# Patient Record
Sex: Female | Born: 1935 | Race: White | Hispanic: No | Smoking: Never smoker
Health system: Southern US, Community
[De-identification: ages and names within clinical notes are randomized; demographics above are authoritative.]

## PROBLEM LIST (undated history)

## (undated) DIAGNOSIS — H409 Unspecified glaucoma: Secondary | ICD-10-CM

## (undated) DIAGNOSIS — M1990 Unspecified osteoarthritis, unspecified site: Secondary | ICD-10-CM

## (undated) DIAGNOSIS — K2270 Barrett's esophagus without dysplasia: Secondary | ICD-10-CM

## (undated) DIAGNOSIS — Z8719 Personal history of other diseases of the digestive system: Secondary | ICD-10-CM

## (undated) DIAGNOSIS — Z8489 Family history of other specified conditions: Secondary | ICD-10-CM

## (undated) DIAGNOSIS — T7840XA Allergy, unspecified, initial encounter: Secondary | ICD-10-CM

## (undated) DIAGNOSIS — K219 Gastro-esophageal reflux disease without esophagitis: Secondary | ICD-10-CM

## (undated) DIAGNOSIS — K2990 Gastroduodenitis, unspecified, without bleeding: Secondary | ICD-10-CM

## (undated) DIAGNOSIS — Z9889 Other specified postprocedural states: Secondary | ICD-10-CM

## (undated) DIAGNOSIS — K2289 Other specified disease of esophagus: Secondary | ICD-10-CM

## (undated) DIAGNOSIS — E785 Hyperlipidemia, unspecified: Secondary | ICD-10-CM

## (undated) DIAGNOSIS — M797 Fibromyalgia: Secondary | ICD-10-CM

## (undated) DIAGNOSIS — K228 Other specified diseases of esophagus: Secondary | ICD-10-CM

## (undated) DIAGNOSIS — D649 Anemia, unspecified: Secondary | ICD-10-CM

## (undated) DIAGNOSIS — D509 Iron deficiency anemia, unspecified: Secondary | ICD-10-CM

## (undated) DIAGNOSIS — O009 Unspecified ectopic pregnancy without intrauterine pregnancy: Secondary | ICD-10-CM

## (undated) DIAGNOSIS — R112 Nausea with vomiting, unspecified: Secondary | ICD-10-CM

## (undated) DIAGNOSIS — K449 Diaphragmatic hernia without obstruction or gangrene: Secondary | ICD-10-CM

## (undated) DIAGNOSIS — K5790 Diverticulosis of intestine, part unspecified, without perforation or abscess without bleeding: Secondary | ICD-10-CM

## (undated) DIAGNOSIS — E611 Iron deficiency: Secondary | ICD-10-CM

## (undated) DIAGNOSIS — R002 Palpitations: Secondary | ICD-10-CM

## (undated) DIAGNOSIS — R011 Cardiac murmur, unspecified: Secondary | ICD-10-CM

## (undated) DIAGNOSIS — I10 Essential (primary) hypertension: Secondary | ICD-10-CM

## (undated) DIAGNOSIS — Z5189 Encounter for other specified aftercare: Secondary | ICD-10-CM

## (undated) DIAGNOSIS — M810 Age-related osteoporosis without current pathological fracture: Secondary | ICD-10-CM

## (undated) DIAGNOSIS — K2970 Gastritis, unspecified, without bleeding: Secondary | ICD-10-CM

## (undated) DIAGNOSIS — I639 Cerebral infarction, unspecified: Secondary | ICD-10-CM

## (undated) DIAGNOSIS — H269 Unspecified cataract: Secondary | ICD-10-CM

## (undated) HISTORY — PX: COLONOSCOPY: SHX174

## (undated) HISTORY — DX: Cardiac murmur, unspecified: R01.1

## (undated) HISTORY — PX: CHOLECYSTECTOMY: SHX55

## (undated) HISTORY — DX: Unspecified osteoarthritis, unspecified site: M19.90

## (undated) HISTORY — PX: TUBAL LIGATION: SHX77

## (undated) HISTORY — PX: ECTOPIC PREGNANCY SURGERY: SHX613

## (undated) HISTORY — DX: Unspecified cataract: H26.9

## (undated) HISTORY — PX: UPPER GASTROINTESTINAL ENDOSCOPY: SHX188

## (undated) HISTORY — DX: Allergy, unspecified, initial encounter: T78.40XA

## (undated) HISTORY — DX: Iron deficiency anemia, unspecified: D50.9

## (undated) HISTORY — DX: Other specified disease of esophagus: K22.89

## (undated) HISTORY — DX: Encounter for other specified aftercare: Z51.89

## (undated) HISTORY — DX: Other specified diseases of esophagus: K22.8

## (undated) HISTORY — PX: CATARACT EXTRACTION, BILATERAL: SHX1313

## (undated) HISTORY — DX: Anemia, unspecified: D64.9

## (undated) HISTORY — DX: Cerebral infarction, unspecified: I63.9

## (undated) HISTORY — DX: Hyperlipidemia, unspecified: E78.5

## (undated) HISTORY — DX: Gastritis, unspecified, without bleeding: K29.70

## (undated) HISTORY — DX: Barrett's esophagus without dysplasia: K22.70

## (undated) HISTORY — DX: Personal history of other diseases of the digestive system: Z87.19

## (undated) HISTORY — PX: FACIAL COSMETIC SURGERY: SHX629

## (undated) HISTORY — DX: Diaphragmatic hernia without obstruction or gangrene: K44.9

## (undated) HISTORY — DX: Age-related osteoporosis without current pathological fracture: M81.0

## (undated) HISTORY — DX: Unspecified glaucoma: H40.9

## (undated) HISTORY — DX: Diverticulosis of intestine, part unspecified, without perforation or abscess without bleeding: K57.90

---
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
## 1998-04-29 ENCOUNTER — Other Ambulatory Visit: Admission: RE | Admit: 1998-04-29 | Discharge: 1998-04-29 | Payer: Self-pay | Admitting: Obstetrics & Gynecology

## 1998-11-26 ENCOUNTER — Inpatient Hospital Stay (HOSPITAL_COMMUNITY): Admission: EM | Admit: 1998-11-26 | Discharge: 1998-11-28 | Payer: Self-pay | Admitting: Gastroenterology

## 1999-05-05 ENCOUNTER — Other Ambulatory Visit: Admission: RE | Admit: 1999-05-05 | Discharge: 1999-05-05 | Payer: Self-pay | Admitting: Obstetrics & Gynecology

## 2000-08-08 ENCOUNTER — Other Ambulatory Visit: Admission: RE | Admit: 2000-08-08 | Discharge: 2000-08-08 | Payer: Self-pay | Admitting: Obstetrics & Gynecology

## 2000-11-02 ENCOUNTER — Ambulatory Visit (HOSPITAL_COMMUNITY): Admission: RE | Admit: 2000-11-02 | Discharge: 2000-11-02 | Payer: Self-pay | Admitting: Obstetrics & Gynecology

## 2000-11-02 ENCOUNTER — Encounter (INDEPENDENT_AMBULATORY_CARE_PROVIDER_SITE_OTHER): Payer: Self-pay

## 2001-08-29 ENCOUNTER — Other Ambulatory Visit: Admission: RE | Admit: 2001-08-29 | Discharge: 2001-08-29 | Payer: Self-pay | Admitting: Obstetrics & Gynecology

## 2003-07-19 ENCOUNTER — Other Ambulatory Visit: Admission: RE | Admit: 2003-07-19 | Discharge: 2003-07-19 | Payer: Self-pay | Admitting: Obstetrics & Gynecology

## 2004-04-07 ENCOUNTER — Encounter: Payer: Self-pay | Admitting: Internal Medicine

## 2004-04-07 DIAGNOSIS — K5730 Diverticulosis of large intestine without perforation or abscess without bleeding: Secondary | ICD-10-CM | POA: Insufficient documentation

## 2004-06-08 ENCOUNTER — Encounter (INDEPENDENT_AMBULATORY_CARE_PROVIDER_SITE_OTHER): Payer: Self-pay | Admitting: *Deleted

## 2004-06-08 ENCOUNTER — Encounter: Admission: RE | Admit: 2004-06-08 | Discharge: 2004-06-08 | Payer: Self-pay | Admitting: General Surgery

## 2004-12-25 ENCOUNTER — Encounter: Admission: RE | Admit: 2004-12-25 | Discharge: 2004-12-25 | Payer: Self-pay | Admitting: Internal Medicine

## 2005-04-22 ENCOUNTER — Encounter: Admission: RE | Admit: 2005-04-22 | Discharge: 2005-04-22 | Payer: Self-pay | Admitting: Internal Medicine

## 2005-09-18 ENCOUNTER — Encounter: Admission: RE | Admit: 2005-09-18 | Discharge: 2005-09-18 | Payer: Self-pay | Admitting: Orthopedic Surgery

## 2005-11-30 ENCOUNTER — Other Ambulatory Visit: Admission: RE | Admit: 2005-11-30 | Discharge: 2005-11-30 | Payer: Self-pay | Admitting: Obstetrics & Gynecology

## 2006-04-11 ENCOUNTER — Encounter: Admission: RE | Admit: 2006-04-11 | Discharge: 2006-05-18 | Payer: Self-pay | Admitting: Chiropractic Medicine

## 2006-05-19 ENCOUNTER — Encounter: Admission: RE | Admit: 2006-05-19 | Discharge: 2006-08-17 | Payer: Self-pay | Admitting: Chiropractic Medicine

## 2007-10-31 ENCOUNTER — Ambulatory Visit: Payer: Self-pay | Admitting: Internal Medicine

## 2007-10-31 LAB — CONVERTED CEMR LAB
Iron: 60 ug/dL (ref 42–145)
Saturation Ratios: 14.6 % — ABNORMAL LOW (ref 20.0–50.0)
Tissue Transglutaminase Ab, IgA: 0.3 units (ref <7)
Transferrin: 293.6 mg/dL (ref >=212.0)
Vitamin B-12: 326 pg/mL (ref 211–911)

## 2007-11-02 ENCOUNTER — Encounter: Payer: Self-pay | Admitting: Internal Medicine

## 2007-11-02 ENCOUNTER — Ambulatory Visit: Payer: Self-pay | Admitting: Internal Medicine

## 2007-11-02 DIAGNOSIS — K449 Diaphragmatic hernia without obstruction or gangrene: Secondary | ICD-10-CM | POA: Insufficient documentation

## 2007-11-02 DIAGNOSIS — K2100 Gastro-esophageal reflux disease with esophagitis, without bleeding: Secondary | ICD-10-CM | POA: Insufficient documentation

## 2007-11-02 DIAGNOSIS — K2970 Gastritis, unspecified, without bleeding: Secondary | ICD-10-CM

## 2007-11-02 DIAGNOSIS — K2270 Barrett's esophagus without dysplasia: Secondary | ICD-10-CM | POA: Insufficient documentation

## 2007-11-02 DIAGNOSIS — K2990 Gastroduodenitis, unspecified, without bleeding: Secondary | ICD-10-CM

## 2007-11-02 HISTORY — DX: Barrett's esophagus without dysplasia: K22.70

## 2007-11-02 HISTORY — DX: Gastritis, unspecified, without bleeding: K29.70

## 2007-11-02 HISTORY — DX: Gastroduodenitis, unspecified, without bleeding: K29.90

## 2007-12-05 ENCOUNTER — Ambulatory Visit: Payer: Self-pay | Admitting: Internal Medicine

## 2007-12-05 LAB — CONVERTED CEMR LAB
Basophils Absolute: 0 10*3/uL (ref 0.0–0.1)
Basophils Relative: 0.3 % (ref 0.0–1.0)
Eosinophils Absolute: 0.1 10*3/uL (ref 0.0–0.6)
Eosinophils Relative: 1.3 % (ref 0.0–5.0)
HCT: 34.9 % — ABNORMAL LOW (ref 36.0–46.0)
Hemoglobin: 12.1 g/dL (ref 12.0–15.0)
Iron: 56 ug/dL (ref 42–145)
Lymphocytes Relative: 19.8 % (ref 12.0–46.0)
MCHC: 34.5 g/dL (ref 30.0–36.0)
MCV: 90.8 fL (ref 78.0–100.0)
Monocytes Absolute: 0.6 10*3/uL (ref 0.2–0.7)
Monocytes Relative: 10.4 % (ref 3.0–11.0)
Neutro Abs: 3.7 10*3/uL (ref 1.4–7.7)
Neutrophils Relative %: 68.2 % (ref 43.0–77.0)
Platelets: 317 10*3/uL (ref 150–400)
RBC: 3.84 M/uL — ABNORMAL LOW (ref 3.87–5.11)
RDW: 12.1 % (ref 11.5–14.6)
Saturation Ratios: 13.9 % — ABNORMAL LOW (ref 20.0–50.0)
Transferrin: 288 mg/dL (ref >=212.0)
WBC: 5.5 10*3/uL (ref 4.5–10.5)

## 2008-01-26 ENCOUNTER — Ambulatory Visit: Payer: Self-pay | Admitting: Internal Medicine

## 2008-01-26 LAB — CONVERTED CEMR LAB
Basophils Absolute: 0.2 10*3/uL — ABNORMAL HIGH (ref 0.0–0.1)
Basophils Relative: 2.4 % — ABNORMAL HIGH (ref 0.0–1.0)
Eosinophils Absolute: 0.1 10*3/uL (ref 0.0–0.6)
Eosinophils Relative: 1.5 % (ref 0.0–5.0)
HCT: 37.6 % (ref 36.0–46.0)
Hemoglobin: 12.4 g/dL (ref 12.0–15.0)
Iron: 71 ug/dL (ref 42–145)
Lymphocytes Relative: 16.9 % (ref 12.0–46.0)
MCHC: 33 g/dL (ref 30.0–36.0)
MCV: 91.3 fL (ref 78.0–100.0)
Monocytes Absolute: 0.5 10*3/uL (ref 0.2–0.7)
Monocytes Relative: 6.5 % (ref 3.0–11.0)
Neutro Abs: 5.8 10*3/uL (ref 1.4–7.7)
Neutrophils Relative %: 72.7 % (ref 43.0–77.0)
Platelets: 323 10*3/uL (ref 150–400)
RBC: 4.12 M/uL (ref 3.87–5.11)
RDW: 12.6 % (ref 11.5–14.6)
Saturation Ratios: 18.8 % — ABNORMAL LOW (ref 20.0–50.0)
Transferrin: 270.2 mg/dL (ref >=212.0)
WBC: 8 10*3/uL (ref 4.5–10.5)

## 2008-02-08 DIAGNOSIS — K219 Gastro-esophageal reflux disease without esophagitis: Secondary | ICD-10-CM | POA: Insufficient documentation

## 2008-02-08 DIAGNOSIS — O009 Unspecified ectopic pregnancy without intrauterine pregnancy: Secondary | ICD-10-CM | POA: Insufficient documentation

## 2008-02-08 DIAGNOSIS — E7800 Pure hypercholesterolemia, unspecified: Secondary | ICD-10-CM | POA: Insufficient documentation

## 2008-02-08 DIAGNOSIS — M129 Arthropathy, unspecified: Secondary | ICD-10-CM | POA: Insufficient documentation

## 2008-02-08 HISTORY — DX: Unspecified ectopic pregnancy without intrauterine pregnancy: O00.90

## 2008-08-27 ENCOUNTER — Encounter: Admission: RE | Admit: 2008-08-27 | Discharge: 2008-08-27 | Payer: Self-pay | Admitting: Orthopedic Surgery

## 2008-10-01 ENCOUNTER — Encounter: Admission: RE | Admit: 2008-10-01 | Discharge: 2008-12-16 | Payer: Self-pay | Admitting: Neurosurgery

## 2009-10-19 ENCOUNTER — Ambulatory Visit: Payer: Self-pay | Admitting: Interventional Radiology

## 2009-10-19 ENCOUNTER — Emergency Department (HOSPITAL_BASED_OUTPATIENT_CLINIC_OR_DEPARTMENT_OTHER): Admission: EM | Admit: 2009-10-19 | Discharge: 2009-10-19 | Payer: Self-pay | Admitting: Emergency Medicine

## 2009-10-23 ENCOUNTER — Ambulatory Visit: Payer: Self-pay | Admitting: Internal Medicine

## 2009-11-04 ENCOUNTER — Ambulatory Visit: Payer: Self-pay | Admitting: Internal Medicine

## 2009-11-08 ENCOUNTER — Encounter: Payer: Self-pay | Admitting: Internal Medicine

## 2009-11-10 ENCOUNTER — Encounter: Payer: Self-pay | Admitting: Internal Medicine

## 2010-03-31 DIAGNOSIS — R0989 Other specified symptoms and signs involving the circulatory and respiratory systems: Secondary | ICD-10-CM | POA: Insufficient documentation

## 2010-03-31 DIAGNOSIS — R0602 Shortness of breath: Secondary | ICD-10-CM | POA: Insufficient documentation

## 2010-04-02 ENCOUNTER — Ambulatory Visit: Payer: Self-pay | Admitting: Cardiovascular Disease

## 2010-04-02 DIAGNOSIS — R002 Palpitations: Secondary | ICD-10-CM

## 2010-04-02 DIAGNOSIS — R5383 Other fatigue: Secondary | ICD-10-CM

## 2010-04-02 DIAGNOSIS — R5381 Other malaise: Secondary | ICD-10-CM | POA: Insufficient documentation

## 2010-04-02 HISTORY — DX: Palpitations: R00.2

## 2010-04-07 ENCOUNTER — Telehealth (INDEPENDENT_AMBULATORY_CARE_PROVIDER_SITE_OTHER): Payer: Self-pay | Admitting: *Deleted

## 2010-04-08 ENCOUNTER — Ambulatory Visit: Payer: Self-pay | Admitting: Cardiology

## 2010-04-08 ENCOUNTER — Ambulatory Visit: Payer: Self-pay | Admitting: Cardiovascular Disease

## 2010-04-08 ENCOUNTER — Encounter (HOSPITAL_COMMUNITY): Admission: RE | Admit: 2010-04-08 | Discharge: 2010-06-23 | Payer: Self-pay | Admitting: Cardiovascular Disease

## 2010-04-08 ENCOUNTER — Encounter (INDEPENDENT_AMBULATORY_CARE_PROVIDER_SITE_OTHER): Payer: Self-pay | Admitting: *Deleted

## 2010-04-08 ENCOUNTER — Ambulatory Visit: Payer: Self-pay

## 2010-04-21 ENCOUNTER — Encounter: Payer: Self-pay | Admitting: Internal Medicine

## 2010-04-21 ENCOUNTER — Ambulatory Visit: Payer: Self-pay | Admitting: Cardiovascular Disease

## 2010-04-28 ENCOUNTER — Encounter: Payer: Self-pay | Admitting: Internal Medicine

## 2010-05-22 ENCOUNTER — Encounter: Payer: Self-pay | Admitting: Internal Medicine

## 2010-05-25 ENCOUNTER — Encounter: Payer: Self-pay | Admitting: Internal Medicine

## 2010-06-04 ENCOUNTER — Ambulatory Visit: Payer: Self-pay | Admitting: Internal Medicine

## 2010-06-04 DIAGNOSIS — D509 Iron deficiency anemia, unspecified: Secondary | ICD-10-CM | POA: Insufficient documentation

## 2010-06-05 ENCOUNTER — Ambulatory Visit (HOSPITAL_COMMUNITY)
Admission: RE | Admit: 2010-06-05 | Discharge: 2010-06-05 | Payer: Self-pay | Source: Home / Self Care | Admitting: Internal Medicine

## 2010-06-26 ENCOUNTER — Encounter: Payer: Self-pay | Admitting: Internal Medicine

## 2010-07-16 ENCOUNTER — Encounter (HOSPITAL_COMMUNITY)
Admission: RE | Admit: 2010-07-16 | Discharge: 2010-09-15 | Payer: Self-pay | Source: Home / Self Care | Admitting: Internal Medicine

## 2010-07-30 ENCOUNTER — Telehealth: Payer: Self-pay | Admitting: Internal Medicine

## 2010-08-05 ENCOUNTER — Encounter: Payer: Self-pay | Admitting: Internal Medicine

## 2010-08-06 ENCOUNTER — Ambulatory Visit: Payer: Self-pay | Admitting: Internal Medicine

## 2010-08-10 ENCOUNTER — Telehealth: Payer: Self-pay | Admitting: Internal Medicine

## 2010-08-14 ENCOUNTER — Encounter: Payer: Self-pay | Admitting: Internal Medicine

## 2010-08-31 ENCOUNTER — Telehealth: Payer: Self-pay | Admitting: Internal Medicine

## 2010-09-02 ENCOUNTER — Ambulatory Visit: Payer: Self-pay | Admitting: Gastroenterology

## 2010-09-02 ENCOUNTER — Encounter: Payer: Self-pay | Admitting: Internal Medicine

## 2010-09-21 ENCOUNTER — Telehealth: Payer: Self-pay | Admitting: Internal Medicine

## 2010-12-23 ENCOUNTER — Encounter: Payer: Self-pay | Admitting: Internal Medicine

## 2011-01-19 NOTE — Procedures (Signed)
Summary: Colonoscopy  Patient: Terri Shah Note: All result statuses are Final unless otherwise noted.  Tests: (1) Colonoscopy (COL)   COL Colonoscopy           DONE     [facility]     [address]     [place], [place]  [address]           COLONOSCOPY PROCEDURE REPORT           PATIENT:  [name], [name]  MR#:  [id number]     BIRTHDATE:  [birth year], 76 yrs. old  GENDER:  female     ENDOSCOPIST:  Hedwig Morton. Juanda Chance, MD     REF. BY:  Creola Corn, M.D.     PROCEDURE DATE:  08/06/2010     PROCEDURE:  Colonoscopy 36644     ASA CLASS:  Class I     INDICATIONS:  Iron deficiency anemia H/H 8.4/27.9,, MCV 58, 8.3%     iron saturation hx of breast cancer     EGD mild gastritis, small bowl Bx normal     s/p remote diverticular bleed in [phone],     MEDICATIONS:   Versed 8 mg, Fentanyl 75 mcg           DESCRIPTION OF PROCEDURE:   After the risks benefits and     alternatives of the procedure were thoroughly explained, informed     consent was obtained.  Digital rectal exam was performed and     revealed no rectal masses.   The LB [id number] endoscope     was introduced through the anus and advanced to the cecum, which     was identified by both the appendix and ileocecal valve, without     limitations.  The quality of the prep was adequate, using MiraLax.     The instrument was then slowly withdrawn as the colon was fully     examined.     <<PROCEDUREIMAGES>>           FINDINGS:  Severe diverticulosis was found throughout the colon     (see image1, image2, and image5).  This was otherwise a normal     examination of the colon (see image6, image4, and image3).     Retroflexed views in the rectum revealed no abnormalities.    The     scope was then withdrawn from the patient and the procedure     completed.           COMPLICATIONS:  None     ENDOSCOPIC IMPRESSION:     1) Severe diverticulosis throughout the colon     2) Otherwise normal examination     nothing  to account for iron def. anemia     RECOMMENDATIONS:     H/H from Dr Ferd Hibbs office pending, she had an iron infusion 3     weeks ago     schedule small bowl capsule endoscopy     REPEAT EXAM:  In 10 year(s) for.           ______________________________     Hedwig Morton. Juanda Chance, MD           CC:           n.     eSIGNED:   Hedwig Morton. Erdem Naas at 08/06/2010 11:01 AM           Henriette Combs, [id number]  Note: An exclamation mark (!) indicates a result that was not dispersed into the flowsheet. Document Creation  Date: 08/06/2010 11:02 AM _______________________________________________________________________  (1) Order result status: Final Collection or observation date-time: 08/06/2010 10:50 Requested date-time:  Receipt date-time:  Reported date-time:  Referring Physician:   Ordering Physician: Lina Sar [phone]) Specimen Source:  Source: Launa Grill Order Number: [id number] Lab site:   Appended Document: Colonoscopy    Clinical Lists Changes  Observations: Added new observation of COLONNXTDUE: 07/2020 (08/06/2010 12:54)      Appended Document: Colonoscopy Capsule endoscopy scheduled for 08/17/10.  Patient instructed to start holding her iron 08/10/10.    Clinical Lists Changes  Orders: Added new Test order of Capsule Endoscopy (Capsule Endoscopy) - Signed

## 2011-01-19 NOTE — Assessment & Plan Note (Signed)
Summary: np6/palps/tachycardia/mild SOB   Visit Type:  Initial Consult Primary Provider:  Creola Corn, MD  CC:  Tachycardia- Sob and tiredness.  History of Present Illness: 77 yo WF with history of HTN, hyperlipidemia, OA, GERD here today for cardiac evaluation. She tells me that her exercise tolerance has been poor lately. This has been a big change for her. She is also easily out of breath when she walks. She has had no dizziness or chest pain. She also notes palpitations and awareness of heavy heart beats. No clear awareness of irregularity of her heart rhythm. Recent TSH normal in Dr. Ferd Hibbs office. BMET reviewed and ok. Overall, her lack of energy, fatigue and breathlessness with minimal exertion are most bothersome. She recently underwent an echocardiogram that showed normal LV size with posterior wall hypokinesis, mild concentric LVH, diastolic dysfunction, EF 60-65%. Aortic valve sclerosis without stenosis. Mild AI, MR,TR.   Current Medications (verified): 1)  Prilosec Otc 20 Mg Tbec (Omeprazole Magnesium) .... Take One By Mouth Once Daily, Twice A Day If Needed 2)  Metamucil 30.9 % Powd (Psyllium) .... Take One Tablespoon Mixed in Water At Night 3)  Benazepril Hcl 20 Mg Tabs (Benazepril Hcl) .... Take One By Mouth Once Daily 4)  Hydrochlorothiazide 25 Mg Tabs (Hydrochlorothiazide) .... Take One By Mouth Once Daily 5)  Lipitor 20 Mg Tabs (Atorvastatin Calcium) .... Take One By Mouth Once Daily 6)  Evista 60 Mg Tabs (Raloxifene Hcl) .... Take One By Mouth Once Daily 7)  Amitriptyline Hcl 25 Mg Tabs (Amitriptyline Hcl) .... Take One By Mouth Once Daily 8)  Tramadol Hcl 100 Mg Xr24h-Tab (Tramadol Hcl) .... Take One By Mouth Once - Two Times A Day As Needed 9)  Tylenol Arthritis Pain 650 Mg Cr-Tabs (Acetaminophen) .... Take One By Mouth As Needed 10)  Vitamin D 1000 Unit Tabs (Cholecalciferol) .... Take 1 Tablet By Mouth Once A Day  Allergies: 1)  ! Codeine  Past History:  Past  Medical History: HYPERCHOLESTEROLEMIA (ICD-272.0) HYPERTENSION (ICD-401.9) GERD (ICD-530.81) ECTOPIC PREGNANCY (ICD-633.90) TUBAL LIGATION, HX OF (ICD-V26.51) ARTHRITIS (ICD-716.90) DIVERTICULOSIS OF COLON (ICD-562.10) GASTRITIS (ICD-535.50) REFLUX ESOPHAGITIS (ICD-530.11) HIATAL HERNIA (ICD-553.3) BARRETT'S ESOPHAGUS (ICD-530.85) Degenerative disk disease Osteoarthritis Right bundle branch block Vitamin D Deficiency  Past Surgical History: Bilateral tubal ligation C- section X 3 Face lift  Family History: Family History of Heart Disease: Mother, sister Family History of Breast Cancer: Sister No FH of Colon Cancer Family History of Diabetes: sister boarderline Family History of Kidney Disease: sister  Mother-deceased, enlarged heart, CVA, age 75 Father-deceased, pneumonia 3 sisters-HTN in one 1 brother-deceased, unknown cause  Review of Systems       The patient complains of fatigue, malaise, palpitations, and shortness of breath.  The patient denies fever, weight gain/loss, vision loss, decreased hearing, hoarseness, chest pain, prolonged cough, wheezing, sleep apnea, coughing up blood, abdominal pain, blood in stool, nausea, vomiting, diarrhea, heartburn, incontinence, blood in urine, muscle weakness, joint pain, leg swelling, rash, skin lesions, headache, fainting, dizziness, depression, anxiety, enlarged lymph nodes, easy bruising or bleeding, and environmental allergies.    Vital Signs:  Patient profile:   77 year old female Height:      62 inches Weight:      135.50 pounds BMI:     24.87 Pulse rate:   73 / minute Pulse rhythm:   regular Resp:     18 per minute BP sitting:   110 / 80  (left arm) Cuff size:   regular  Vitals Entered By:  Vikki Ports (April 02, 2010 12:06 PM)  Physical Exam  General:  General: Well developed, well nourished, NAD HEENT: OP clear, mucus membranes moist SKIN: warm, dry Neuro: No focal deficits Musculoskeletal: Muscle  strength 5/5 all ext Psychiatric: Mood and affect normal Neck: No JVD, no carotid bruits, no thyromegaly, no lymphadenopathy. Lungs:Clear bilaterally, no wheezes, rhonci, crackles CV: RRR no murmurs, gallops rubs Abdomen: soft, NT, ND, BS present Extremities: No edema, pulses 2+.    EKG  Procedure date:  04/02/2010  Findings:      NSR, rate 73 bpm.  LAE. RBBB LVH Possible old lateral and  inferior infarction  Echocardiogram  Procedure date:  01/28/2010  Findings:      Normal LV size and function, EF 65%. Possible posterior wall hypokinesis.  Mild LVH. Diastolic dysfunction Aortic valve sclerosis without stenosis Mild AI, MR, TR   Impression & Recommendations:  Problem # 1:  FATIGUE / MALAISE (ICD-780.79) Her echocardiogram shows a possible posterior wall motion abnormality and her EKG shows possible prior inferior infarction. She has a family history of CAD. Her risk factors for CAD include HTN and hyperlipidemia. We will arrange an exercise stress myoview here in our office.  Recent TSH normal.  The stress myoview is ordered for the following reason: Pt is 77 yo with family h/o of CAD, personal history of HTN and hyperlipidemia with an abnormal EKG, abnormal echocardiogram and complaints of fatigue and exertional dyspnea that may be her anginal equivalent.   Problem # 2:  PALPITATIONS (ICD-785.1) I will have her wear a 48 hour monitor.   Her updated medication list for this problem includes:    Benazepril Hcl 20 Mg Tabs (Benazepril hcl) .Marland Kitchen... Take one by mouth once daily  Orders: Nuclear Stress Test (Nuc Stress Test) Holter Monitor (Holter Monitor)  Problem # 3:  HYPERTENSION (ICD-401.9) BP well controlled.   Her updated medication list for this problem includes:    Benazepril Hcl 20 Mg Tabs (Benazepril hcl) .Marland Kitchen... Take one by mouth once daily    Hydrochlorothiazide 25 Mg Tabs (Hydrochlorothiazide) .Marland Kitchen... Take one by mouth once daily  Patient Instructions: 1)   Your physician recommends that you schedule a follow-up appointment in: 3 weeks 2)  Your physician has recommended that you wear a holter monitor.  Holter monitors are medical devices that record the heart's electrical activity. Doctors most often use these monitors to diagnose arrhythmias. Arrhythmias are problems with the speed or rhythm of the heartbeat. The monitor is a small, portable device. You can wear one while you do your normal daily activities. This is usually used to diagnose what is causing palpitations/syncope (passing out). 3)  Your physician has requested that you have an exercise stress myoview.  For further information please visit [email or web address]  Please follow instruction sheet, as given.

## 2011-01-19 NOTE — Procedures (Signed)
Summary: summary report  summary report   Imported By: Mirna Mires 04/22/2010 10:07:27  _____________________________________________________________________  External Attachment:    Type:   Image     Comment:   External Document

## 2011-01-19 NOTE — Procedures (Signed)
Summary: Capsule Endoscopy Report / [facility]  Capsule Endoscopy Report /  GI   Imported By: Lennie Odor 10/12/2010 16:36:21  _____________________________________________________________________  External Attachment:    Type:   Image     Comment:   External Document

## 2011-01-19 NOTE — Progress Notes (Signed)
Summary: Med Questions  Phone Note Call from Patient Call back at Home Phone [phone]   Caller: Patient Call For: Dr Juanda Chance Reason for Call: Talk to Nurse Details for Reason: Capsule Endo Summary of Call: Pt has questions regarding what RX she can take prior appt. (specifically pain meds) Initial call taken by: Dwan Bolt,  August 31, 2010 10:31 AM  Follow-up for Phone Call        patient wants to nknow if it is ok for her to take Tylenol or Tramadol prior to capsule.  Advised this is ok. Follow-up by: Darcey Nora RN, CGRN,  August 31, 2010 10:39 AM

## 2011-01-19 NOTE — Letter (Signed)
Summary: [facility] Instructions  [facility]  [address] [place], [place] [address]   Phone: [phone]  Fax: [phone]       Terri Shah    [birth year]    MRN: [id number]       Procedure Day /Date: Monday 06/08/10     Arrival Time: 8:00 am     Procedure Time: 9:00 am     Location of Procedure:                    _x _  [facility] (4th Floor)  PREPARATION FOR COLONOSCOPY WITH MIRALAX  Starting 5 days prior to your procedure (06/03/10) do not eat nuts, seeds, popcorn, corn, beans, peas,  salads, or any raw vegetables.  Do not take any fiber supplements (e.g. Metamucil, Citrucel, and Benefiber). ____________________________________________________________________________________________________   THE DAY BEFORE YOUR PROCEDURE         DATE: 06/07/10 DAY: Sunday  1   Drink clear liquids the entire day-NO SOLID FOOD  2   Do not drink anything colored red or purple.  Avoid juices with pulp.  No orange juice.  3   Drink at least 64 oz. (8 glasses) of fluid/clear liquids during the day to prevent dehydration and help the prep work efficiently.  CLEAR LIQUIDS INCLUDE: Water Jello Ice Popsicles Tea (sugar ok, no milk/cream) Powdered fruit flavored drinks Coffee (sugar ok, no milk/cream) Gatorade Juice: apple, white grape, white cranberry  Lemonade Clear bullion, consomm, broth Carbonated beverages (any kind) Strained chicken noodle soup Hard Candy  4   Mix the entire bottle of Miralax with 64 oz. of Gatorade/Powerade in the morning and put in the refrigerator to chill.  5   At 3:00 pm take 2 Dulcolax/Bisacodyl tablets.  6   At 4:30 pm take one Reglan/Metoclopramide tablet.  7  Starting at 5:00 pm drink one 8 oz glass of the Miralax mixture every 15-20 minutes until you have finished drinking the entire 64 oz.  You should finish drinking prep around 7:30 or 8:00 pm.  8   If you are nauseated, you may take the 2nd Reglan/Metoclopramide tablet  at 6:30 pm.        9    At 8:00 pm take 2 more DULCOLAX/Bisacodyl tablets.        THE DAY OF YOUR PROCEDURE      DATE:  06/08/10 DAY: _Monday  You may drink clear liquids until 7:00am  (2 HOURS BEFORE PROCEDURE).   MEDICATION INSTRUCTIONS  Unless otherwise instructed, you should take regular prescription medications with a small sip of water as early as possible the morning of your procedure.  hold iron beginning today!         OTHER INSTRUCTIONS  You will need a responsible adult at least 75 years of age to accompany you and drive you home.   This person must remain in the waiting room during your procedure.  Wear loose fitting clothing that is easily removed.  Leave jewelry and other valuables at home.  However, you may wish to bring a book to read or an iPod/MP3 player to listen to music as you wait for your procedure to start.  Remove all body piercing jewelry and leave at home.  Total time from sign-in until discharge is approximately 2-3 hours.  You should go home directly after your procedure and rest.  You can resume normal activities the day after your procedure.  The day of your procedure you should not:  Drive   Make legal decisions   Operate machinery   Drink alcohol   Return to work  You will receive specific instructions about eating, activities and medications before you leave.   The above instructions have been reviewed and explained to me by   _______________________    I fully understand and can verbalize these instructions _____________________________ Date 06/04/10

## 2011-01-19 NOTE — Letter (Signed)
Summary: [facility]  [facility]   Imported By: Lamona Curl CMA (AAMA) 08/10/2010 11:51:48  _____________________________________________________________________  External Attachment:    Type:   Image     Comment:   External Document

## 2011-01-19 NOTE — Procedures (Signed)
Summary: capsule endoscopy/iron def anemia/sheri  Patient here today for capsule endoscopy for Dr. Juanda Chance .  Pt verbalized understanding of all verbal and written instructions.  Pt tolerated well.  Lot #  [id number]  25  exp 2012-10 .

## 2011-01-19 NOTE — Op Note (Signed)
Summary: Infusion / [facility]  Infusion / [facility]   Imported By: Lennie Odor 09/08/2010 12:10:04  _____________________________________________________________________  External Attachment:    Type:   Image     Comment:   External Document

## 2011-01-19 NOTE — Procedures (Signed)
Summary: Capsule Endoscopy   Capsule Endoscopy  Procedure date:  09/02/2010  Findings:      Performing Location: [facility]   Ordering Physician: Lina Sar, MD    Report created/read by: Stan Head, MD  Reason for Referral:  77 y/o female with an iron deficiency, anemia non responsive to oral iron.  Recent negative colon/EGD  Procedure Information and Findings:  1) Complete study, good prep  2) No cause of anemia found 3 ) small possible submucosal bulge at about 2 hours and 15 minutes  Summary and Recommendations:  Per Dr Juanda Chance.  Clinical correlation re: possible submucosal bulge is advised.  This is subtle and non specific finding at this point.    This report was created from the original report, which was reviewed and signed by the above listed reading physician.   Appended Document: Capsule Endoscopy please call pt with results, we did not find any  bleeding lesion. I recommend to follow her blood count every 3 months for the next year while taking iron supplements.  Appended Document: Capsule Endoscopy pt notified of results.  Dr Timothy Lasso is checking a CBC q 3 months, her next one will be in November.  She is asked to call back for any further problems and have Dr Timothy Lasso send [place] the lab results.

## 2011-01-19 NOTE — Assessment & Plan Note (Signed)
Summary: ANEMIA           Terri   History of Present Illness Visit Type: consult  Primary GI MD: Lina Sar MD Primary Provider: Creola Corn, MD Requesting Provider: Creola Corn, MD Chief Complaint: anemia x2 months, iron def History of Present Illness:   Terri Shah is a very nice 77 year old white female whom we have seen in the past for anemia and diverticulosis. She has a history of several  diverticular bleeds first one  in 1992, two were in 1999, and 1  in March 2005. She had 2 hospitalizations for it, and was evaluated by Dr. Abbey Chatters in 2005 for consideration of a laparoscopically assisted segmental colectomy, but she opted not to have it done. Her last colonoscopy was in April 2005, and it confirmed the presence of severe diverticulosis of the left colon. An endoscopy was in 2008 and it showed gastritis, esophagitis and a hiatal hernia. Biopsies found her to have Barrett's esophagus. A repeat endoscopy performed in 10/2009 showed a stricture in the distal esophagus and a hiatal hernia but was an otherwise normal examination. Biopsies showed benign mildly inflamed gastroesophageal junction mucosa but no intestinal metaplasia, dysplasia or malignancy. Her reflux symptoms are adequately controlled with Nexium but she cannot afford it and has to take over-the-counter Prilosec 20 mg twice a day. Patient comes today at the request of Dr Creola Corn for a repeat evaluation of her anemia. On 05/22/10, her hemoglobin was 10.0, hematocrit was 32.2%, iron was low at 36 and iron saturation was also low at 8.3%. Patient has already been tested for celiac disease  and is negative. She does not take any NSAID's and has been taking oral iron supplements 1-2 tablets daily without any increase in her iron stores.she gave blood loss fall but not since then.    GI Review of Systems      Denies abdominal pain, acid reflux, belching, bloating, chest pain, dysphagia with liquids, dysphagia with solids, heartburn,  loss of appetite, nausea, vomiting, vomiting blood, weight loss, and  weight gain.        Denies anal fissure, black tarry stools, change in bowel habit, constipation, diarrhea, diverticulosis, fecal incontinence, heme positive stool, hemorrhoids, irritable bowel syndrome, jaundice, light color stool, liver problems, rectal bleeding, and  rectal pain.    Current Medications (verified): 1)  Prilosec Otc 20 Mg Tbec (Omeprazole Magnesium) .... Take One By Mouth Once Daily, Twice A Day If Needed 2)  Metamucil 30.9 % Powd (Psyllium) .... Take One Tablespoon Mixed in Water At Night 3)  Benazepril Hcl 20 Mg Tabs (Benazepril Hcl) .... Take One By Mouth Once Daily 4)  Hydrochlorothiazide 25 Mg Tabs (Hydrochlorothiazide) .... Take One By Mouth Once Daily 5)  Lipitor 20 Mg Tabs (Atorvastatin Calcium) .... Take One By Mouth Once Daily 6)  Evista 60 Mg Tabs (Raloxifene Hcl) .... Take One By Mouth Once Daily 7)  Amitriptyline Hcl 25 Mg Tabs (Amitriptyline Hcl) .... Take One By Mouth Once Daily 8)  Tramadol Hcl 100 Mg Xr24h-Tab (Tramadol Hcl) .... Take One By Mouth Once - Two Times A Day As Needed 9)  Tylenol Arthritis Pain 650 Mg Cr-Tabs (Acetaminophen) .... Take One By Mouth As Needed 10)  Vitamin D 1000 Unit Tabs (Cholecalciferol) .... Take 1 Tablet By Mouth Once A Day  Allergies (verified): 1)  ! Codeine  Past History:  Past Medical History: Reviewed history from 04/21/2010 and no changes required. HYPERCHOLESTEROLEMIA (ICD-272.0) HYPERTENSION (ICD-401.9) Hypertensive heart disease (mild concentric LVH with  grade 1 diastolic dysfunction) GERD (ICD-530.81) ECTOPIC PREGNANCY (ICD-633.90) TUBAL LIGATION, HX OF (ICD-V26.51) ARTHRITIS (ICD-716.90) DIVERTICULOSIS OF COLON (ICD-562.10) GASTRITIS (ICD-535.50) REFLUX ESOPHAGITIS (ICD-530.11) HIATAL HERNIA (ICD-553.3) BARRETT'S ESOPHAGUS (ICD-530.85) Degenerative disk disease Osteoarthritis Right bundle branch block Vitamin D Deficiency  Past  Surgical History: Reviewed history from 04/02/2010 and no changes required. Bilateral tubal ligation C- section X 3 Face lift  Family History: Reviewed history from 04/02/2010 and no changes required. Family History of Heart Disease: Mother, sister Family History of Breast Cancer: Sister No FH of Colon Cancer Family History of Diabetes: sister boarderline Family History of Kidney Disease: sister  Mother-deceased, enlarged heart, CVA, age 52 Father-deceased, pneumonia 3 sisters-HTN in one 1 brother-deceased, unknown cause  Social History: Reviewed history from 10/23/2009 and no changes required. Alcohol Use - yes-occasional Illicit Drug Use - no Patient has never smoked.  Daily Caffeine Use Patient gets regular exercise. water areobics, walking  Review of Systems       The patient complains of allergy/sinus, anemia, arthritis/joint pain, back pain, cough, fatigue, muscle pains/cramps, shortness of breath, and thirst - excessive.  The patient denies anxiety-new, blood in urine, breast changes/lumps, change in vision, confusion, coughing up blood, depression-new, fainting, fever, headaches-new, hearing problems, heart murmur, heart rhythm changes, itching, menstrual pain, night sweats, nosebleeds, pregnancy symptoms, skin rash, sleeping problems, sore throat, swelling of feet/legs, swollen lymph glands, thirst - excessive , urination - excessive , urination changes/pain, urine leakage, vision changes, and voice change.         Pertinent positive and negative review of systems were noted in the above HPI. All other ROS was otherwise negative.   Vital Signs:  Patient profile:   77 year old female Height:      62 inches Weight:      136.25 pounds BMI:     25.01 Pulse rate:   72 / minute Pulse rhythm:   regular BP sitting:   140 / 80  (left arm) Cuff size:   regular  Vitals Entered By: June McMurray CMA Duncan Dull) (June 04, 2010 8:42 AM)  Physical Exam  General:  Well developed,  well nourished, no acute distress. Eyes:  nonicteric. Mouth:  normal papulated tongue. Neck:  Supple; no masses or thyromegaly. Lungs:  Clear throughout to auscultation. Heart:  Regular rate and rhythm; no murmurs, rubs,  or bruits. Abdomen:  soft abdomen with normal active bowel sounds. No bruit. No tenderness. Liver edge at costal margin. Rectal:  dark Hemoccult negative stool. Extremities:  No clubbing, cyanosis, edema or deformities noted. Skin:  no stigmata of liver disease Psych:  Alert and cooperative. Normal mood and affect.   Impression & Recommendations:  Problem # 1:  ANEMIA, IRON DEFICIENCY (ICD-280.9)  Patient has heme-negative iron deficiency anemia not responsive to iron supplements. She is again Hemoccult negative to date. A recent upper endoscopy did not show any lesion to account for anemia. Her last colonoscopy was 6 years ago. We will have to go ahead and repeat the colonoscopy and if it is negative, then we will proceed with a small bowel capsule endoscopy to look for AV malformations. She is scheduled for an iron infusion tomorrow. A small bowel capsule endoscopy would have to be done off iron.   Orders: Colonoscopy (Colon)  Problem # 2:  BARRETT'S ESOPHAGUS (ICD-530.85) Patient had goblet cells and intestinal metaplasia on esophageal biopsies in 2008 but this was not reproduced on her last endoscopy in November 2010. She still continues on acid suppressing agents; Prilosec 20  mg twice a day.  Patient Instructions: 1)  Schedule colonoscopy. 2)  If negative, will proceed with small bowel capsule endoscopy. 3)  Iron infusion tomorrow. 4)  Check iron and CBC in 3 weeks following infusion. 5)  If anemia is not responsive to iron, then I would consider a hematology consultation. 6)  Copy sent to : Dr Timothy Lasso 7)  The medication list was reviewed and reconciled.  All changed / newly prescribed medications were explained.  A complete medication list was provided to the  patient / caregiver. Prescriptions: DULCOLAX 5 MG  TBEC (BISACODYL) Day before procedure take 2 at 3pm and 2 at 8pm.  #4 x 0   Entered by:   Lamona Curl CMA (AAMA)   Authorized by:   Hart Carwin MD   Signed by:   Lamona Curl CMA (AAMA) on 06/04/2010   Method used:   Electronically to        [facility]* (retail)       [address] [place], [place]  [address]       Ph: [phone]       Fax: [phone]   RxID:   [id number] REGLAN 10 MG  TABS (METOCLOPRAMIDE HCL) As per prep instructions.  #2 x 0   Entered by:   Lamona Curl CMA (AAMA)   Authorized by:   Hart Carwin MD   Signed by:   Lamona Curl CMA (AAMA) on 06/04/2010   Method used:   Electronically to        [facility]* (retail)       [address] [place], [place]  [address]       Ph: [phone]       Fax: [phone]   RxID:   [id number] MIRALAX   POWD (POLYETHYLENE GLYCOL 3350) As per prep  instructions.  #255 grams x 0   Entered by:   Lamona Curl CMA (AAMA)   Authorized by:   Hart Carwin MD   Signed by:   Lamona Curl CMA (AAMA) on 06/04/2010   Method used:   Electronically to        [facility]* (retail)       [address] [place], [place]  [address]       Ph: [phone]       Fax: [phone]   RxID:   [id number]

## 2011-01-19 NOTE — Progress Notes (Signed)
Summary: Nuclear Pre-Procedure  Phone Note Outgoing Call Call back at [facility] Phone [phone]   Call placed by: Stanton Kidney, EMT-P,  April 07, 2010 2:03 PM Call placed to: Patient Action Taken: Phone Call Completed Summary of Call: Reviewed information on Myoview Information Sheet (see scanned document for further details).  Spoke with Patient.    Nuclear Med Background Indications for Stress Test: Evaluation for Ischemia   History: Echo  History Comments: 2/11 Echo: EF= 60-65%, mild LVH, Diastolic dysfunction  Symptoms: DOE, Fatigue, Fatigue with Exertion, Palpitations    Nuclear Pre-Procedure Cardiac Risk Factors: Family History - CAD, Hypertension, Lipids, RBBB Height (in): 62

## 2011-01-19 NOTE — Assessment & Plan Note (Signed)
Summary: 3wk f/u sl ok per dr. Clifton James   Visit Type:  3 wk f/u Primary Provider:  Creola Corn, MD  CC:  shortness of breath.  History of Present Illness: 77 yo WF with history of HTN, hyperlipidemia, OA, GERD here today for cardiac follow up. At the first visit, she told me that her exercise tolerance has been poor lately. This has been a big change for her. She is also easily out of breath when she walks. She has had no dizziness or chest pain. She also notes palpitations and awareness of heavy heart beats. No clear awareness of irregularity of her heart rhythm. Recent TSH normal in Dr. Ferd Hibbs office. BMET reviewed and ok. Overall, her lack of energy, fatigue and breathlessness with minimal exertion are most bothersome. She recently underwent an echocardiogram that showed normal LV size with posterior wall hypokinesis, mild concentric LVH, diastolic dysfunction, EF 60-65%. Aortic valve sclerosis without stenosis. Mild AI, MR,TR.   I ordered an exercise stress myoview and a Holter monitor. Her stress test showed no ischemia. LV function was vigorous. Her Holter showed rare PVCs.  She has noticed less palpitations over the last few weeks. Unfortunately, she did not notice any while wearing the heart monitor. This am, after drinking coffee, she felt  a few palpitations.   Problems Prior to Update: 1)  Hypertension, Heart Controlled w/o Assoc CHF  (ICD-402.10) 2)  Fatigue / Malaise  (ICD-780.79) 3)  Palpitations  (ICD-785.1) 4)  Shortness of Breath  (ICD-786.05) 5)  Tachycardia  (ICD-785) 6)  Hypercholesterolemia  (ICD-272.0) 7)  Hypertension  (ICD-401.9) 8)  Gerd  (ICD-530.81) 9)  Ectopic Pregnancy  (ICD-633.90) 10)  Tubal Ligation, Hx of  (ICD-V26.51) 11)  Arthritis  (ICD-716.90) 12)  Diverticulosis of Colon  (ICD-562.10) 13)  Gastritis  (ICD-535.50) 14)  Reflux Esophagitis  (ICD-530.11) 15)  Hiatal Hernia  (ICD-553.3) 16)  Barrett's Esophagus  (ICD-530.85)  Current Medications  (verified): 1)  Prilosec Otc 20 Mg Tbec (Omeprazole Magnesium) .... Take One By Mouth Once Daily, Twice A Day If Needed 2)  Metamucil 30.9 % Powd (Psyllium) .... Take One Tablespoon Mixed in Water At Night 3)  Benazepril Hcl 20 Mg Tabs (Benazepril Hcl) .... Take One By Mouth Once Daily 4)  Hydrochlorothiazide 25 Mg Tabs (Hydrochlorothiazide) .... Take One By Mouth Once Daily 5)  Lipitor 20 Mg Tabs (Atorvastatin Calcium) .... Take One By Mouth Once Daily 6)  Evista 60 Mg Tabs (Raloxifene Hcl) .... Take One By Mouth Once Daily 7)  Amitriptyline Hcl 25 Mg Tabs (Amitriptyline Hcl) .... Take One By Mouth Once Daily 8)  Tramadol Hcl 100 Mg Xr24h-Tab (Tramadol Hcl) .... Take One By Mouth Once - Two Times A Day As Needed 9)  Tylenol Arthritis Pain 650 Mg Cr-Tabs (Acetaminophen) .... Take One By Mouth As Needed 10)  Vitamin D 1000 Unit Tabs (Cholecalciferol) .... Take 1 Tablet By Mouth Once A Day  Allergies: 1)  ! Codeine  Past History:  Past Medical History: HYPERCHOLESTEROLEMIA (ICD-272.0) HYPERTENSION (ICD-401.9) Hypertensive heart disease (mild concentric LVH with grade 1 diastolic dysfunction) GERD (ICD-530.81) ECTOPIC PREGNANCY (ICD-633.90) TUBAL LIGATION, HX OF (ICD-V26.51) ARTHRITIS (ICD-716.90) DIVERTICULOSIS OF COLON (ICD-562.10) GASTRITIS (ICD-535.50) REFLUX ESOPHAGITIS (ICD-530.11) HIATAL HERNIA (ICD-553.3) BARRETT'S ESOPHAGUS (ICD-530.85) Degenerative disk disease Osteoarthritis Right bundle branch block Vitamin D Deficiency  Social History: Reviewed history from 10/23/2009 and no changes required. Alcohol Use - yes-occasional Illicit Drug Use - no Patient has never smoked.  Daily Caffeine Use Patient gets regular exercise. water areobics,  walking  Review of Systems       The patient complains of palpitations and shortness of breath.  The patient denies fatigue, malaise, fever, weight gain/loss, vision loss, decreased hearing, hoarseness, chest pain, prolonged cough,  wheezing, sleep apnea, coughing up blood, abdominal pain, blood in stool, nausea, vomiting, diarrhea, heartburn, incontinence, blood in urine, muscle weakness, joint pain, leg swelling, rash, skin lesions, headache, fainting, dizziness, depression, anxiety, enlarged lymph nodes, easy bruising or bleeding, and environmental allergies.    Vital Signs:  Patient profile:   77 year old female Height:      62 inches Weight:      136 pounds BMI:     24.96 Pulse rate:   80 / minute BP sitting:   131 / 75  (left arm) Cuff size:   regular  Vitals Entered By: Oswald Hillock (Apr 21, 2010 11:05 AM)  Physical Exam  General:  General: Well developed, well nourished, NAD Musculoskeletal: Muscle strength 5/5 all ext Psychiatric: Mood and affect normal Neck: No JVD, no carotid bruits, no thyromegaly, no lymphadenopathy. Lungs:Clear bilaterally, no wheezes, rhonci, crackles CV: RRR no murmurs, gallops rubs Abdomen: soft, NT, ND, BS present Extremities: No edema, pulses 2+.    Nuclear ETT  Procedure date:  04/08/2010  Findings:      The patient exercised for  5:15.  The patient stopped due to fatigue.  She c/o chest pain/pressure, 6/10, that was quickly relieved with rest.  There were no diagnostic ST-T wave changes; only occasional PVC's.  Myoview was injected at peak exercise and myocardial perfusion imaging was performed after a brief delay.  QPS  Raw Data Images:  Acuisition technically good; normal left ventricular size. Stress Images:  There is mild decreased uptake in the distal anterior wall. Rest Images:  There is mild decreased uptake in the distal anterior wall. Subtraction (SDS):  No evidence of ischemia. Transient Ischemic Dilatation:  .94  (Normal <1.22)  Lung/Heart Ratio:  .32  (Normal <0.45)  Quantitative Gated Spect Images  QGS EDV:  51 ml QGS ESV:  7 ml QGS EF:  86 % QGS cine images:  Normal wall motion.   Overall Impression   Exercise Capacity: Fair exercise  capacity. BP Response: Normal blood pressure response. Clinical Symptoms: There is chest pain ECG Impression: No significant ST segment change suggestive of ischemia. Overall Impression: There is mild soft tissue attenuation but no sign of scar or ischemia.   Holter Monitor  Procedure date:  04/08/2010  Findings:      Normal sinus rhythm. Rare PVCs (1 over 48 hours).   Impression & Recommendations:  Problem # 1:  HYPERTENSION, HEART CONTROLLED W/O ASSOC CHF (ICD-402.10) BP well controlled today. Repeat echo one year.   Her updated medication list for this problem includes:    Benazepril Hcl 20 Mg Tabs (Benazepril hcl) .Marland Kitchen... Take one by mouth once daily    Hydrochlorothiazide 25 Mg Tabs (Hydrochlorothiazide) .Marland Kitchen... Take one by mouth once daily  Problem # 2:  PALPITATIONS (ICD-785.1) Likely secondary to PVCs. Avoid caffeine and stimulants.   Her updated medication list for this problem includes:    Benazepril Hcl 20 Mg Tabs (Benazepril hcl) .Marland Kitchen... Take one by mouth once daily  Problem # 3:  SHORTNESS OF BREATH (ICD-786.05) No evidence of ischemia on stress testing. I do not think that this is cardiac related. Her EF is normal. I have discussed cardiac cath in the future to assess coronary arteries (for definitive diagnosis)  and PA pressures  if she continues to have SOB or has onset of any chest pain.   Her updated medication list for this problem includes:    Benazepril Hcl 20 Mg Tabs (Benazepril hcl) .Marland Kitchen... Take one by mouth once daily    Hydrochlorothiazide 25 Mg Tabs (Hydrochlorothiazide) .Marland Kitchen... Take one by mouth once daily  Patient Instructions: 1)  Your physician recommends that you schedule a follow-up appointment in: 12 months 2)  Your physician has requested that you have an echocardiogram.  Echocardiography is a painless test that uses sound waves to create images of your heart. It provides your doctor with information about the size and shape of your heart and how well your  heart's chambers and valves are working.  This procedure takes approximately one hour. There are no restrictions for this procedure. To be done in 12 months

## 2011-01-19 NOTE — Assessment & Plan Note (Signed)
Summary: Cardiology Nuclear Study  Nuclear Med Background Indications for Stress Test: Evaluation for Ischemia   History: Echo  History Comments: 2/11 Echo: EF= 60-65%, mild LVH  Symptoms: Chest Pressure, Chest Pressure with Exertion, DOE, Fatigue, Fatigue with Exertion, Palpitations  Symptoms Comments: c/o leg weakness, R=L. Last episode of HK:[id number].   Nuclear Pre-Procedure Cardiac Risk Factors: Family History - CAD, Hypertension, Lipids, RBBB Caffeine/Decaff Intake: None NPO After: 7:30 PM Lungs: Clear IV 0.9% NS with Angio Cath: 20g     IV Site: (L) AC IV Started by: Irean Hong RN Chest Size (in) 38     Cup Size C     Height (in): 62 Weight (lb): 134 BMI: 24.60  Nuclear Med Study 1 or 2 day study:  1 day     Stress Test Type:  Stress Reading MD:  Olga Millers, MD     Referring MD:  Melene Muller, MD Resting Radionuclide:  Technetium 40m Tetrofosmin     Resting Radionuclide Dose:  11 mCi  Stress Radionuclide:  Technetium 71m Tetrofosmin     Stress Radionuclide Dose:  33 mCi   Stress Protocol Exercise Time (min):  5:16 min     Max HR:  133 bpm     Predicted Max HR:  147 bpm  Max Systolic BP: 159 mm Hg     Percent Max HR:  90.48 %     METS: 7.0 Rate Pressure Product:  75643    Stress Test Technologist:  Rea College CMA-N     Nuclear Technologist:  Domenic Polite CNMT  Rest Procedure  Myocardial perfusion imaging was performed at rest 45 minutes following the intravenous administration of Myoview Technetium 75m Tetrofosmin.  Stress Procedure  The patient exercised for  5:15.  The patient stopped due to fatigue.  She c/o chest pain/pressure, 6/10, that was quickly relieved with rest.  There were no diagnostic ST-T wave changes; only occasional PVC's.  Myoview was injected at peak exercise and myocardial perfusion imaging was performed after a brief delay.  QPS Raw Data Images:  Acuisition technically good; normal left ventricular size. Stress Images:   There is mild decreased uptake in the distal anterior wall. Rest Images:  There is mild decreased uptake in the distal anterior wall. Subtraction (SDS):  No evidence of ischemia. Transient Ischemic Dilatation:  .94  (Normal <1.22)  Lung/Heart Ratio:  .32  (Normal <0.45)  Quantitative Gated Spect Images QGS EDV:  51 ml QGS ESV:  7 ml QGS EF:  86 % QGS cine images:  Normal wall motion.   Overall Impression  Exercise Capacity: Fair exercise capacity. BP Response: Normal blood pressure response. Clinical Symptoms: There is chest pain ECG Impression: No significant ST segment change suggestive of ischemia. Overall Impression: There is mild soft tissue attenuation but no sign of scar or ischemia.  Appended Document: Cardiology Nuclear Study No ischemia. Can we call her with results? cdm  Appended Document: Cardiology Nuclear Study LMTCB./[place]  Appended Document: Cardiology Nuclear Study PT AWARE./[place]

## 2011-01-19 NOTE — Letter (Signed)
Summary: [facility]   Imported By: Sherian Rein 05/08/2010 08:56:48  _____________________________________________________________________  External Attachment:    Type:   Image     Comment:   External Document

## 2011-01-19 NOTE — Letter (Signed)
Summary: [facility] - Outpatient Coinsurance Notice  [facility] - Outpatient Co[facility]   Imported By: Marylou Mccoy 04/09/2010 11:48:15  _____________________________________________________________________  External Attachment:    Type:   Image     Comment:   External Document

## 2011-01-19 NOTE — Progress Notes (Signed)
Summary: Triage  Phone Note Call from Patient Call back at Home Phone [phone]   Caller: Patient Call For: Dr. Juanda Chance Reason for Call: Talk to Nurse Summary of Call: pt. has COL sch'd for 08-06-10...normally takes metamucil...wants to know if she can take a stool softener Initial call taken by: Karna Christmas,  July 30, 2010 2:07 PM  Follow-up for Phone Call        Pt informed,  OK to take stool softner. Follow-up by: Ashok Cordia RN,  July 30, 2010 2:17 PM

## 2011-01-19 NOTE — Letter (Signed)
Summary: [facility]  [facility]   Imported By: Lester Noble 06/02/2010 13:39:34  _____________________________________________________________________  External Attachment:    Type:   Image     Comment:   External Document

## 2011-01-19 NOTE — Progress Notes (Signed)
Summary: labwork received?  Phone Note Call from Patient Call back at Home Phone [phone]   Caller: Patient Call For: Dr. Juanda Chance Reason for Call: Talk to Nurse Summary of Call: would like to know if Dr. Juanda Chance has received the lastest labwork report with iron levels from Dr. Timothy Lasso Initial call taken by: Vallarie Mare,  August 10, 2010 10:22 AM  Follow-up for Phone Call        No. I have called to request that they send over those results.  Follow-up by: Lamona Curl CMA Duncan Dull),  August 10, 2010 10:55 AM  Additional Follow-up for Phone Call Additional follow up Details #1::        Results recieved from Dr Ferd Hibbs office.  Dr Juanda Chance- Patient wonders since her hemoglobin has gone up if you would like her to keep her capsule endoscopy appointment on 08/17/10 or if you would like to cancel it. (I have entered the 08/05/10 labs into emr for your review) Additional Follow-up by: Dottie Nelson-Smith CMA Duncan Dull),  August 10, 2010 11:39 AM    Additional Follow-up for Phone Call Additional follow up Details #2::    Please keep the SBCE scheduled. Also , please, set up for another Iron infusion, it is helping but she needs more iron. Follow-up by: Hart Carwin MD,  August 10, 2010 7:34 PM  Additional Follow-up for Phone Call Additional follow up Details #3:: Details for Additional Follow-up Action Taken: I have spoken to the patient to explain that Dr Juanda Chance would like for her to keep her SBCE scheduled as we need to make certain there is no bleeding source that would be reason for her anemia. I have also advised her that while her iron levels have improved, it is likely a result of her oral iron intake as well as the infusions. Dr Juanda Chance would like for her to be set up for a repeat iron infusion. Patient has been scheduled and advised of date and time of appointment. She has also asked to reschedule her SBCE to mid September.  Additional Follow-up by: Lamona Curl CMA Duncan Dull),   August 11, 2010 8:25 AM

## 2011-01-19 NOTE — Letter (Signed)
Summary: [facility]   Imported By: Lamona Curl CMA (AAMA) 08/10/2010 11:40:07  _____________________________________________________________________  External Attachment:    Type:   Image     Comment:   External Document

## 2011-01-19 NOTE — Progress Notes (Signed)
Summary: Triage  Phone Note Call from Patient Call back at Home Phone [phone]   Caller: Patient Call For: Dr. Juanda Chance Reason for Call: Lab or Test Results Summary of Call: Calling about the capsule endo that she had Initial call taken by: Karna Christmas,  September 21, 2010 4:28 PM  Follow-up for Phone Call        patient advised we will call her with the results as soon as they are available. Follow-up by: Darcey Nora RN, CGRN,  September 21, 2010 4:53 PM

## 2011-03-25 LAB — COMPREHENSIVE METABOLIC PANEL
ALT: 16 U/L (ref 0–35)
AST: 21 U/L (ref 0–37)
Albumin: 3.8 g/dL (ref 3.5–5.2)
Alkaline Phosphatase: 103 U/L (ref 39–117)
BUN: 9 mg/dL (ref 6–23)
CO2: 27 mEq/L (ref 19–32)
Calcium: 8.9 mg/dL (ref 8.4–10.5)
Chloride: 99 mEq/L (ref 96–112)
Creatinine, Ser: 0.7 mg/dL (ref 0.4–1.2)
GFR calc Af Amer: >60 mL/min (ref >60)
GFR calc non Af Amer: >60 mL/min (ref >60)
Glucose, Bld: 110 mg/dL — ABNORMAL HIGH (ref 70–99)
Potassium: 3.8 mEq/L (ref 3.5–5.1)
Sodium: 136 mEq/L (ref 135–145)
Total Bilirubin: 0.3 mg/dL (ref 0.3–1.2)
Total Protein: 6.7 g/dL (ref 6.0–8.3)

## 2011-03-25 LAB — URINALYSIS, ROUTINE W REFLEX MICROSCOPIC
Bilirubin Urine: NEGATIVE
Glucose, UA: NEGATIVE mg/dL
Hgb urine dipstick: NEGATIVE
Ketones, ur: NEGATIVE mg/dL
Nitrite: NEGATIVE
Protein, ur: NEGATIVE mg/dL
Specific Gravity, Urine: 1.007 (ref 1.005–1.030)
Urobilinogen, UA: 0.2 mg/dL (ref 0.0–1.0)
pH: 6 (ref 5.0–8.0)

## 2011-03-25 LAB — DIFFERENTIAL
Basophils Absolute: 0.1 10*3/uL (ref 0.0–0.1)
Basophils Relative: 1 % (ref 0–1)
Eosinophils Absolute: 0 10*3/uL (ref 0.0–0.7)
Eosinophils Relative: 0 % (ref 0–5)
Lymphocytes Relative: 10 % — ABNORMAL LOW (ref 12–46)
Lymphs Abs: 1.3 10*3/uL (ref 0.7–4.0)
Monocytes Absolute: 0.8 10*3/uL (ref 0.1–1.0)
Monocytes Relative: 6 % (ref 3–12)
Neutro Abs: 10.4 10*3/uL — ABNORMAL HIGH (ref 1.7–7.7)
Neutrophils Relative %: 83 % — ABNORMAL HIGH (ref 43–77)

## 2011-03-25 LAB — CBC
HCT: 33.7 % — ABNORMAL LOW (ref 36.0–46.0)
Hemoglobin: 11.4 g/dL — ABNORMAL LOW (ref 12.0–15.0)
MCHC: 33.9 g/dL (ref 30.0–36.0)
MCV: 89.9 fL (ref 78.0–100.0)
Platelets: 301 10*3/uL (ref 150–400)
RBC: 3.75 MIL/uL — ABNORMAL LOW (ref 3.87–5.11)
RDW: 13.2 % (ref 11.5–15.5)
WBC: 12.6 10*3/uL — ABNORMAL HIGH (ref 4.0–10.5)

## 2011-03-25 LAB — URINE CULTURE

## 2011-03-25 LAB — POCT CARDIAC MARKERS
CKMB, poc: 1 ng/mL (ref 1.0–8.0)
Myoglobin, poc: 80 ng/mL (ref 12–200)
Troponin i, poc: <0.05 ng/mL (ref 0.00–0.09)

## 2011-05-04 ENCOUNTER — Encounter: Payer: Self-pay | Admitting: Cardiovascular Disease

## 2011-05-04 NOTE — Assessment & Plan Note (Signed)
[facility]                         GASTROENTEROLOGY OFFICE NOTE   NAME:JOHNSONSkylen, Shah                      MRN:          [id number]  DATE:10/31/2007                            DOB:          02/21/1934    [name] is a very nice 77 year old white female who is here at Dr.  Ferd Hibbs recommendation for dropping hemoglobin.  Dr. Ferd Hibbs records  indicate a hemoglobin was recently 12.9, and subsequently dropped to  11.6, and most recently on September 18, 2007 was down to 11.3.  This  was while she was taking iron supplements daily.  Patient has a history  of severe diverticular bleeds in 1992, two in 1999, and 1 in March 2005.  She had 2 hospitalizations for it, and was evaluated by Dr. Abbey Chatters  in 2005 for consideration of laparoscopically assisted segmental  colectomy, but she opted not to have it done, and has done quite well  for the past 3 years.  She denies any visible blood per rectum or  abdominal pain.  She most recently had an episode while doing water  aerobics of nausea and vomiting.  She takes Prilosec 20 mg a day for  gastroesophageal reflux.  She needs it on a daily basis, and never  forgets to take it because of symptoms, which start immediately within  24 hours of not taking the Prilosec.  She denies any dysphagia.  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  Her last  colonoscopy was in April 2005, and confirmed presence of severe  diverticulosis of the left colon.   MEDICATIONS:  1. Centrum vitamins.  2. Iron supplements.  3. Prilosec 20 p.o. daily.  4. Benzathine/hydrochlorothiazide 20/12.5 mg p.o. daily.  5. Amitriptyline 50 mg p.o. nightly.  6. Fish oil.  7. Lipitor.  8. Celebrex 200 mg daily.  9. Evista 60 mg p.o. daily.  10.Vitamin D.  11.Metamucil.   PAST HISTORY:  Significant for:  1. High cholesterol.  2. Arthritis.  3. High blood pressure.   OPERATIONS:  1. Tubal ligation.  2. Ectopic pregnancy.   FAMILY HISTORY:  Positive for diabetes in mother.  Breast cancer in  older sister.  Heart disease in mother.   SOCIAL HISTORY:  She is widowed with 3 children.  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  She does not drink alcohol  or smoke.   REVIEW OF SYSTEMS:  Positive for eyeglasses, arthritic complaints, skin  rash, anemia, back pain.   PHYSICAL EXAMINATION:  Blood pressure 136/82.  Pulse 86.  Weight 143  pounds.  She was alert, oriented, and in no distress.  SKIN:  Was warm and dry.  LUNGS:  Clear to auscultation.  COR:  Normal S1 and S2.  ABDOMEN:  Soft with minimal tenderness in epigastric area.  There were  normoactive bowel sounds.  Lower abdomen was unremarkable.  RECTAL EXAM:  Dark trace Hemoccult positive stool.  There were no  external hemorrhoids.   IMPRESSION:  A 77 year old white female  with a drop in hemoglobin of 1.5  gm in the setting of prior history of major diverticular bleeds, but now  without any evidence of visible blood.  She has had some upper  gastrointestinal symptoms recently while taking Celebrex 200 mg a day.  There is a possibility that her gastrointestinal blood loss is  precipitated by erosive gastritis or Celebrex associated gastropathy.  Since she had a colonoscopy within the last 3 years, the most likely  source of the bleeding will be upper gastrointestinal.   PLAN:  1. Minimize Celebrex.  Patient says that she cannot go without it.  2. Samples of Nexium 40 mg daily in placed of Prilosec.  3. Upper endoscopy scheduled.  4. We will check her tissue transglutaminase level to rule out      malabsorption.  We will also check her hemoglobin, iron studies,      and B-12 as well to rule out multifactorial anemia.  We will have      to discuss her Celebrex, depending on the results of the upper      endoscopy, especially if she has gastritis  with gastropathy related      to Celebrex, she may have to hold the Celebrex temporarily until      her lesion heals.     Hedwig Morton. Juanda Chance, MD  Electronically Signed    DMB/MedQ  DD: 10/31/2007  DT: 11/01/2007  Job #: [id number]   cc:   Gwen Pounds, MD

## 2011-05-07 NOTE — Op Note (Signed)
[facility]  Patient:    Terri Shah, Terri Shah                      MRN: [id number] Proc. Date: 11/02/00 Adm. Date:  [id number] Attending:  Minette Headland                           Operative Report  PREOPERATIVE DIAGNOSIS:       Postmenopausal bleeding, suspected endometrial                               polyp, ultrasound.  POSTOPERATIVE DIAGNOSES:      1. Small endometrial polyp.                               2. Small band-like adhesion of endometrial                                  cavity.  PROCEDURE:                    1. Hysteroscopy, with dilatation and curettage.                               2. Removal of adhesion and of polyp.  SURGEON:                      Freddy Finner, M.D.  ANESTHESIA:                   Intravenous sedation, paracervical block                               using 10 cc of 1% Xylocaine.  ESTIMATED INTRAOPERATIVE BLOOD LOSS:  Less than 5 cc.  SORBITOL DEFICIT:             20 cc.  INTRAOPERATIVE COMPLICATIONS: None.  INDICATIONS:                  The patient is a 77 year old who is on hormone replacement therapy, and had an episode of postmenopausal bleeding.  A pelvic ultrasound showed an echogenic focus within the uterus, consistent with a polyp.  She is admitted now for a hysteroscopy and a D&C.  INTRAOPERATIVE FINDINGS:      A small polyp on the left lateral endometrial surface, and a band-like adhesion in that location also.  No other abnormalities were noted within the cavity.  The uterus sounded 8.0 cm.  DESCRIPTION OF PROCEDURE:     The patient was admitted to day surgery and was brought to the operating room and placed under adequate intravenous sedation, and placed in the dorsal lithotomy position, using the Allen stirrup system. A Betadine prep of the perineum and the vagina was carried out.  Sterile drapes were applied.  A medium Pederson speculum was required because of narrowing of the vaginal apex.   The cervix was visualized and grasped with a single tooth tenaculum.  The paracervical block was placed using 10 cc of 1% Xylocaine.  Injections were made at 4 and 8 oclock in the vaginal fornices. The cervix was progressively  dilated to 23 with Pratts.  The [facility] 12.5-degree hysteroscope was introduced using 3% Sorbitol as a distending medium. Inspection of the cavity revealed a small polyp and adhesion as noted above. A thorough curettage and with polyp forceps was carried out.  Reinspection revealed adequate resection of the polyp and the adhesion.  The material was submitted for histologic examination.  The procedure was terminated.  All instruments were removed.  The patient was taken to the recovery room in good condition and will be discharged in the immediate postoperative period, for followup in the office in approximately one week.  She is to take ibuprofen as needed for postoperative pain.  She is given a prescription for Darvocet, to be taken for more severe cramping pain. DD:  11/02/00 TD:  11/02/00 Job: [id number]

## 2011-08-13 ENCOUNTER — Encounter: Payer: Self-pay | Admitting: *Deleted

## 2011-08-13 ENCOUNTER — Emergency Department (HOSPITAL_BASED_OUTPATIENT_CLINIC_OR_DEPARTMENT_OTHER)
Admission: EM | Admit: 2011-08-13 | Discharge: 2011-08-13 | Disposition: A | Discharge status: Home / Self Care | Payer: Medicare Other | Attending: Emergency Medicine | Admitting: Emergency Medicine

## 2011-08-13 DIAGNOSIS — I10 Essential (primary) hypertension: Secondary | ICD-10-CM | POA: Insufficient documentation

## 2011-08-13 DIAGNOSIS — R040 Epistaxis: Secondary | ICD-10-CM

## 2011-08-13 HISTORY — DX: Essential (primary) hypertension: I10

## 2011-08-13 NOTE — ED Notes (Signed)
Pt with intermittent left nare bleeding x 10 days pt has seen Dr Jearld Fenton on Tuesday pt denies injury. Bleeding has stopped at present

## 2011-08-13 NOTE — Discharge Instructions (Signed)
Epistaxis (Nosebleed)   Nosebleeds can be caused by many conditions including trauma, infections, polyps, foreign bodies, dry mucous membranes or climate, medications and air conditioning. Most nosebleeds occur in the front of the nose. It is because of this location that most nosebleeds can be controlled by pinching the nostrils gently and continuously. Do this for at least 10 to 20 minutes. The reason for this long continuous pressure is that you must hold it long enough for the blood to clot. If during that 10 to 20 minute time period, pressure is released, the process may have to be started again. The nosebleed may stop by itself, quit with pressure, need concentrated heating (cautery) or stop with pressure from packing.   HOME CARE INSTRUCTIONS   If your nose was packed, try to maintain the pack inside until your caregiver removes it. If a gauze pack was used and it starts to fall out, gently replace or cut the end off. Do NOT cut if a balloon catheter was used to pack the nose. Otherwise, do not remove unless instructed.   Avoid blowing your nose for 12 hours after treatment. This could dislodge the pack or clot and start bleeding again.   If the bleeding starts again, sit up and bending forward, gently pinch the front half of your nose continuously for 20 minutes.   If bleeding was caused by dry mucous membranes, cover the inside of your nose every morning with a petroleum or antibiotic ointment. Use your little fingertip as an applicator. Do this as needed during dry weather. This will keep the mucous membranes moist and allow them to heal.   Maintain humidity in your home by using less air conditioning or using a humidifier.   Do not use aspirin or medications which make bleeding more likely. Your caregiver can give you recommendations on this.   Resume normal activities as able but try to avoid straining, lifting or bending at the waist for several days.   If the nosebleeds become recurrent and the cause  is unknown, your caregiver may suggest laboratory tests.   SEEK IMMEDIATE MEDICAL CARE IF:   Bleeding recurs and cannot be controlled.   There is unusual bleeding from or bruising on other parts of the body.   An unexplained oral temperature above 101 develops.   Nosebleeds continue.   There is any worsening of the condition which originally brought you in.   You become light headed, feel faint, become sweaty or vomit blood.   MAKE SURE YOU:   Understand these instructions.   Will watch your condition.   Will get help right away if you are not doing well or get worse.   Document Released: 09/15/2005 Document Re-Released: 11/24/2009   [facility] Patient Information 2011 [place], [place].

## 2011-08-13 NOTE — ED Provider Notes (Signed)
History     CSN: [id number] Arrival date & time: 08/13/2011  7:06 AM  Chief Complaint  Patient presents with  . Epistaxis   HPI Comments: History is obtained from the patient. The patient reports intermittent epistaxis from her left nares over the last week and a half. She did see Dr. flyers on Tuesday but at that time she had no bleeding and he was not able to determine a specific source. He recommended the patient use some Afrin on cotton ball applied to the inside portion of her left nares order stoplight bleeding. She has done this several times with success. However last night it 03 100 she was awake and had spontaneous bleeding from her left side again that was described as pouring out. It was not "pumping". She did try to apply pressure to her left nares however this caused blood to accumulate down her throat. She does not have any throat pain, stomach pain, nausea or vomiting. The patient decided to come here for evaluation and prior to arrival the bleeding did stop spontaneously. She denies any lightheadedness or dizziness. She denies use of aspirin, ibuprofen, Coumadin, Plavix. She does request that if she needs to see ears nose throat physician again, she would prefer another physician. She also reports that she is using saline and attempting to keep her nose moist and clean. She reports that she's never had significant problems with nosebleeds in the past. She does admit she has a history of sinus problems including postnasal drip which can lead to sore throats in coughing.  Patient is a 77 y.o. female presenting with nosebleeds.  Epistaxis     Past Medical History  Diagnosis Date  . Hypertension     Past Surgical History  Procedure Date  . Cesarean section     History reviewed. No pertinent family history.  History  Substance Use Topics  . Smoking status: Never Smoker   . Smokeless tobacco: Not on file  . Alcohol Use: No    OB History    Grav Para Term Preterm  Abortions TAB SAB Ect Mult Living                  Review of Systems  HENT: Positive for nosebleeds.   All other systems reviewed and are negative.    Physical Exam  BP 166/91  Pulse 88  Temp(Src) 97.9 F (36.6 C) (Oral)  Resp 18  SpO2 100%  Physical Exam  Constitutional: She is oriented to person, place, and time. She appears well-developed and well-nourished. No distress.  HENT:  Head: Normocephalic and atraumatic.  Nose: No mucosal edema. Right sinus exhibits no maxillary sinus tenderness and no frontal sinus tenderness. Left sinus exhibits no maxillary sinus tenderness and no frontal sinus tenderness.  Mouth/Throat: Uvula is midline and oropharynx is clear and moist.       There is evidence of prior bleeding in her left nares. There is minimal edema and no obvious source of bleeding currently.  Eyes: EOM are normal. Pupils are equal, round, and reactive to light.  Cardiovascular: Normal rate.   Pulmonary/Chest: Effort normal and breath sounds normal.  Abdominal: Soft.  Neurological: She is alert and oriented to person, place, and time.  Skin: Skin is warm and dry.  Psychiatric: She has a normal mood and affect.    ED Course  Procedures  MDM There is no evidence of current bleeding. I do not suspect the patient has had any significant blood loss. She is not currently  on any type of blood thinners. The patient is hemodynamically stable at this time. Patient is offered a [employer] or other form of nasal packing which she declines at this time. I discussed with her the use of Afrin as a spray is probably okay for immediate bleeding as well as holding pressure. At her request I did contact Dr. Suszanne Conners who is on call for ENT currently. He is happy to see her in his office sometime next week. She is to call his office for an appointment which have relayed to the patient.      Gavin Pound. Oletta Lamas, MD 08/13/11 [phone]

## 2011-10-06 ENCOUNTER — Telehealth: Payer: Self-pay | Admitting: Cardiovascular Disease

## 2011-10-06 NOTE — Telephone Encounter (Signed)
12,Stress faxed to [facility] @ [phone]  10/06/11/km

## 2011-10-08 ENCOUNTER — Encounter: Payer: Self-pay | Admitting: Cardiovascular Disease

## 2011-10-13 ENCOUNTER — Ambulatory Visit: Payer: Medicare Other | Attending: Orthopedic Surgery | Admitting: Physical Therapy

## 2011-10-13 DIAGNOSIS — M25619 Stiffness of unspecified shoulder, not elsewhere classified: Secondary | ICD-10-CM | POA: Insufficient documentation

## 2011-10-13 DIAGNOSIS — M25519 Pain in unspecified shoulder: Secondary | ICD-10-CM | POA: Insufficient documentation

## 2011-10-13 DIAGNOSIS — IMO0001 Reserved for inherently not codable concepts without codable children: Secondary | ICD-10-CM | POA: Insufficient documentation

## 2011-10-18 ENCOUNTER — Ambulatory Visit: Payer: Medicare Other | Admitting: Physical Therapy

## 2011-10-22 ENCOUNTER — Ambulatory Visit: Payer: Medicare Other | Attending: Sports Medicine | Admitting: Physical Therapy

## 2011-10-22 DIAGNOSIS — M25519 Pain in unspecified shoulder: Secondary | ICD-10-CM | POA: Insufficient documentation

## 2011-10-22 DIAGNOSIS — M25619 Stiffness of unspecified shoulder, not elsewhere classified: Secondary | ICD-10-CM | POA: Insufficient documentation

## 2011-10-22 DIAGNOSIS — IMO0001 Reserved for inherently not codable concepts without codable children: Secondary | ICD-10-CM | POA: Insufficient documentation

## 2011-10-25 ENCOUNTER — Ambulatory Visit: Payer: Medicare Other | Admitting: Physical Therapy

## 2011-10-27 ENCOUNTER — Ambulatory Visit: Payer: Medicare Other | Admitting: Physical Therapy

## 2011-11-01 ENCOUNTER — Ambulatory Visit: Payer: Medicare Other | Admitting: Physical Therapy

## 2011-11-02 ENCOUNTER — Other Ambulatory Visit (INDEPENDENT_AMBULATORY_CARE_PROVIDER_SITE_OTHER): Payer: Medicare Other

## 2011-11-02 ENCOUNTER — Ambulatory Visit (INDEPENDENT_AMBULATORY_CARE_PROVIDER_SITE_OTHER): Payer: Medicare Other | Admitting: Internal Medicine

## 2011-11-02 ENCOUNTER — Telehealth: Payer: Self-pay | Admitting: Internal Medicine

## 2011-11-02 ENCOUNTER — Encounter: Payer: Self-pay | Admitting: Internal Medicine

## 2011-11-02 VITALS — BP 128/76 | HR 72 | Ht 62.0 in | Wt 134.0 lb

## 2011-11-02 DIAGNOSIS — K922 Gastrointestinal hemorrhage, unspecified: Secondary | ICD-10-CM

## 2011-11-02 DIAGNOSIS — K625 Hemorrhage of anus and rectum: Secondary | ICD-10-CM

## 2011-11-02 DIAGNOSIS — D62 Acute posthemorrhagic anemia: Secondary | ICD-10-CM

## 2011-11-02 LAB — CBC WITH DIFFERENTIAL/PLATELET
Basophils Absolute: 0 10*3/uL (ref 0.0–0.1)
Basophils Relative: 0.6 % (ref 0.0–3.0)
Eosinophils Absolute: 0.1 10*3/uL (ref 0.0–0.7)
Eosinophils Relative: 1.6 % (ref 0.0–5.0)
HCT: 35 % — ABNORMAL LOW (ref 36.0–46.0)
Hemoglobin: 11.9 g/dL — ABNORMAL LOW (ref 12.0–15.0)
Lymphocytes Relative: 21.9 % (ref 12.0–46.0)
Lymphs Abs: 1.6 10*3/uL (ref 0.7–4.0)
MCHC: 34.1 g/dL (ref 30.0–36.0)
MCV: 94.3 fl (ref 78.0–100.0)
Monocytes Absolute: 0.5 10*3/uL (ref 0.1–1.0)
Monocytes Relative: 7.2 % (ref 3.0–12.0)
Neutro Abs: 5.1 10*3/uL (ref 1.4–7.7)
Neutrophils Relative %: 68.7 % (ref 43.0–77.0)
Platelets: 291 10*3/uL (ref 150.0–400.0)
RBC: 3.72 Mil/uL — ABNORMAL LOW (ref 3.87–5.11)
RDW: 12.6 % (ref 11.5–14.6)
WBC: 7.4 10*3/uL (ref 4.5–10.5)

## 2011-11-02 LAB — IBC PANEL
Iron: 33 ug/dL — ABNORMAL LOW (ref 42–145)
Saturation Ratios: 12.2 % — ABNORMAL LOW (ref 20.0–50.0)
Transferrin: 193 mg/dL — ABNORMAL LOW (ref 212.0–360.0)

## 2011-11-02 NOTE — Telephone Encounter (Signed)
Patient reports that she had one episode of rectal bleeding this am with a BM  Was bright red.  She denies constipation, diarrhea, fever, abdominal pain or other GI symptoms.  Patient had a colon 07/2010 that showed severe diverticulosis, she also had a capsule endo 08/2010 for iron def anemia that showed "1) Complete study, good prep 2) No cause of anemia found 3 ) small possible submucosal bulge at about 2 hours and 15 minutes".  No further bleeding this am, patient is concerned that she should be seen per your previous recommendations when she has repeat bleeding.  Dr Juanda Chance please advise

## 2011-11-02 NOTE — Telephone Encounter (Signed)
I will be happy to see her today if there is  Space, it would be a quick visit. Also, if not possible, would at least check her CBC, Iron studies today.

## 2011-11-02 NOTE — Progress Notes (Signed)
Terri Shah Nov 26, 1934 MRN [id number]   History of Present Illness:  This is a 77 year old white female with a recurrent lower GI bleed. She noticed about a tablespoon of bright red blood in her stool this morning. Her hemoglobin is 11.9, hematocrit 35.0. She had a dark bowel movement just prior to coming to the office. She has a history of several diverticular bleeds with the first one being in 1992 and 2 episodes in 1999. There was one episode in March 2005. She was evaluated for a laparoscopically-assisted segmental colectomy by Dr. Abbey Chatters in 2005 but declined. She has not had a recurrent bleed since 2005. She has been on Metamucil and a high-fiber diet. She was diagnosed with Barrett's esophagus. Her last endoscopy in November 2010 showed a stricture. The biopsy showed no evidence of intestinal metaplasia.   Past Medical History  Diagnosis Date  . Hypertension   . Anemia   . Hyperlipidemia   . Arthritis   . Diverticulosis   . Gastritis   . Hiatal hernia   . Barrett esophagus    Past Surgical History  Procedure Date  . Cesarean section     x 3  . Tubal ligation   . Facial cosmetic surgery     reports that she has never smoked. She does not have any smokeless tobacco history on file. She reports that she drinks alcohol. She reports that she does not use illicit drugs. family history includes Breast cancer in her sister; Diabetes in her sister; Heart disease in her father and mother; Kidney disease in her sister; and Stroke in her mother.  There is no history of Colon cancer. Allergies  Allergen Reactions  . Codeine         Review of Systems: Denies dysphagia odynophagia chest pain or shortness of breath  The remainder of the 10 point ROS is negative except as outlined in H&P   Physical Exam: General appearance  Well developed, in no distress. Eyes- non icteric. HEENT nontraumatic, normocephalic. Mouth no lesions, tongue papillated, no cheilosis. Neck supple  without adenopathy, thyroid not enlarged, no carotid bruits, no JVD. Lungs Clear to auscultation bilaterally. Cor normal S1, normal S2, regular rhythm, no murmur,  quiet precordium. Abdomen: Soft nontender abdomen with normal active bowel sounds. Rectal: And anoscopic exam reveals normal perianal area. Normal rectal sphincter tone. No significant internal hemorrhoids. Normal and stool in the rectal ampulla. No evidence of active lesion. Extremities no pedal edema. Skin no lesions. Neurological alert and oriented x 3. Psychological normal mood and affect.  Assessment and Plan:  Problem #1 Recurrent lower GI bleed likely diverticular in nature. She is hemodynamically stable with a hemoglobin of 11.9. She will stay on full liquids X 3 days.  There is no need for admission at this time. She will call us within the next 24 and 48 hours to give [place] an update. I asked her not to go out of town for the next several days until her stools are clear of blood.  Problem #2 Moderately severe diverticulosis of the sigmoid colon. Patient is status post many bleeds previously. She declined prior suggestions for sigmoid resection.  Problem #3 History of Barrett's esophagus. This was not confirmed on the last endoscopy in 2010. Patient is to continue Prilosec 20 mg twice a day.   11/02/2011 Lina Sar

## 2011-11-02 NOTE — Telephone Encounter (Signed)
Patient advised.  I have asked her to come for lab work at 3:15 and advised her she will be seen at the endo of Dr Regino Schultze schedule.

## 2011-11-02 NOTE — Patient Instructions (Signed)
Please stay on a full liquid diet x 3 days. Please call us back with a status update on Thursday at [phone] CC: Dr Timothy Lasso

## 2011-11-03 ENCOUNTER — Telehealth: Payer: Self-pay | Admitting: *Deleted

## 2011-11-03 NOTE — Telephone Encounter (Signed)
Terri Shah given results and recommendations. Terri Shah wants Dr. Juanda Chance to know she did have fresh blood and clots in her stool this AM. She reports it is less blood than yesterday but Dr. Juanda Chance wanted her to call if it happened again.

## 2011-11-03 NOTE — Telephone Encounter (Signed)
Message copied by Daphine Deutscher on Wed Nov 03, 2011  9:08 AM ------      Message from: Hart Carwin      Created: Tue Nov 02, 2011  8:30 PM       Please call pt , she has a very low iron levels. Start OTC one-a-day iron, recheck CBC in 4 weeks

## 2011-11-03 NOTE — Telephone Encounter (Signed)
OK 

## 2011-11-04 ENCOUNTER — Telehealth: Payer: Self-pay | Admitting: Internal Medicine

## 2011-11-04 DIAGNOSIS — K625 Hemorrhage of anus and rectum: Secondary | ICD-10-CM

## 2011-11-04 NOTE — Telephone Encounter (Signed)
Bleeding almost stopped. Getting better each day. She wants to know if she needs to see Dr. Juanda Chance when she has the CBC checked in 4 weeks. Please, advise.

## 2011-11-05 ENCOUNTER — Ambulatory Visit: Payer: Medicare Other | Admitting: Physical Therapy

## 2011-11-05 NOTE — Telephone Encounter (Signed)
She does not have to see me, just have a blood test at that time.

## 2011-11-05 NOTE — Telephone Encounter (Signed)
Pt aware.

## 2011-11-08 ENCOUNTER — Ambulatory Visit: Payer: Medicare Other | Admitting: Physical Therapy

## 2011-11-08 LAB — FERRITIN: Ferritin: 844.2 ng/mL — ABNORMAL HIGH (ref 10.0–291.0)

## 2011-11-09 ENCOUNTER — Telehealth: Payer: Self-pay | Admitting: *Deleted

## 2011-11-09 DIAGNOSIS — D649 Anemia, unspecified: Secondary | ICD-10-CM

## 2011-11-09 NOTE — Telephone Encounter (Signed)
Message copied by Daphine Deutscher on Tue Nov 09, 2011  8:57 AM ------      Message from: Hart Carwin      Created: Mon Nov 08, 2011 10:30 PM       Please tell pt that her CBC is OK, but ferritin is elevated, it may indicated an iron storage disorder. Stop taking any Iron or MVT's with iron . Repeat CBC FE,TIBC in 2 weeks.

## 2011-11-09 NOTE — Telephone Encounter (Signed)
Patient given lab results and recommendations by Dr. Juanda Chance. Patient will stop iron pills. Labs in EPIC. Note to remind patient.

## 2011-11-10 ENCOUNTER — Ambulatory Visit: Payer: Medicare Other | Admitting: Physical Therapy

## 2011-11-15 ENCOUNTER — Encounter: Payer: Medicare Other | Admitting: Physical Therapy

## 2011-11-15 ENCOUNTER — Ambulatory Visit: Payer: Medicare Other | Admitting: Physical Therapy

## 2011-11-17 ENCOUNTER — Ambulatory Visit: Payer: Medicare Other | Admitting: Physical Therapy

## 2011-11-18 ENCOUNTER — Encounter: Payer: Medicare Other | Admitting: Physical Therapy

## 2011-11-22 ENCOUNTER — Ambulatory Visit: Payer: Medicare Other | Attending: Orthopedic Surgery | Admitting: Physical Therapy

## 2011-11-22 DIAGNOSIS — IMO0001 Reserved for inherently not codable concepts without codable children: Secondary | ICD-10-CM | POA: Insufficient documentation

## 2011-11-22 DIAGNOSIS — M25619 Stiffness of unspecified shoulder, not elsewhere classified: Secondary | ICD-10-CM | POA: Insufficient documentation

## 2011-11-22 DIAGNOSIS — M25519 Pain in unspecified shoulder: Secondary | ICD-10-CM | POA: Insufficient documentation

## 2011-11-23 ENCOUNTER — Ambulatory Visit: Payer: Medicare Other

## 2011-11-23 DIAGNOSIS — D649 Anemia, unspecified: Secondary | ICD-10-CM

## 2011-11-23 LAB — FERRITIN: Ferritin: 600.4 ng/mL — ABNORMAL HIGH (ref 10.0–291.0)

## 2011-11-23 LAB — CBC WITH DIFFERENTIAL/PLATELET
Basophils Absolute: 0 10*3/uL (ref 0.0–0.1)
Basophils Relative: 0.4 % (ref 0.0–3.0)
Eosinophils Absolute: 0.1 10*3/uL (ref 0.0–0.7)
Eosinophils Relative: 1.7 % (ref 0.0–5.0)
HCT: 35.2 % — ABNORMAL LOW (ref 36.0–46.0)
Hemoglobin: 11.9 g/dL — ABNORMAL LOW (ref 12.0–15.0)
Lymphocytes Relative: 23.4 % (ref 12.0–46.0)
Lymphs Abs: 1.4 10*3/uL (ref 0.7–4.0)
MCHC: 33.9 g/dL (ref 30.0–36.0)
MCV: 94.4 fl (ref 78.0–100.0)
Monocytes Absolute: 0.7 10*3/uL (ref 0.1–1.0)
Monocytes Relative: 11.6 % (ref 3.0–12.0)
Neutro Abs: 3.7 10*3/uL (ref 1.4–7.7)
Neutrophils Relative %: 62.9 % (ref 43.0–77.0)
Platelets: 275 10*3/uL (ref 150.0–400.0)
RBC: 3.73 Mil/uL — ABNORMAL LOW (ref 3.87–5.11)
RDW: 12.6 % (ref 11.5–14.6)
WBC: 5.9 10*3/uL (ref 4.5–10.5)

## 2011-11-23 LAB — IBC PANEL
Iron: 71 ug/dL (ref 42–145)
Saturation Ratios: 26 % (ref 20.0–50.0)
Transferrin: 195.3 mg/dL — ABNORMAL LOW (ref 212.0–360.0)

## 2011-11-24 ENCOUNTER — Ambulatory Visit: Payer: Medicare Other | Admitting: Physical Therapy

## 2011-11-30 ENCOUNTER — Ambulatory Visit: Payer: Medicare Other | Admitting: Physical Therapy

## 2011-12-02 ENCOUNTER — Ambulatory Visit: Payer: Medicare Other | Admitting: Physical Therapy

## 2011-12-02 ENCOUNTER — Telehealth: Payer: Self-pay | Admitting: Internal Medicine

## 2011-12-02 DIAGNOSIS — D649 Anemia, unspecified: Secondary | ICD-10-CM

## 2011-12-02 NOTE — Telephone Encounter (Signed)
Left a message for patient with Dr. Regino Schultze recommendations. Labs in EPIC. Note to remind patient.

## 2011-12-02 NOTE — Telephone Encounter (Signed)
Please call pt with stable CBC 11.9, unchanged from 3 weeks before that. Her Iron level is normal. She doe not have to take Iron  At this time. Repat CBC in 6 weeks.

## 2011-12-02 NOTE — Telephone Encounter (Signed)
Patient calling for results of labs from 11/23/11. She has stopped her iron and would like to know if she needs to do anything else. Please, advise.

## 2011-12-09 ENCOUNTER — Ambulatory Visit: Payer: Medicare Other | Admitting: Physical Therapy

## 2011-12-16 ENCOUNTER — Encounter: Payer: Self-pay | Admitting: Internal Medicine

## 2011-12-29 ENCOUNTER — Ambulatory Visit: Payer: Medicare Other | Attending: Orthopedic Surgery | Admitting: Physical Therapy

## 2011-12-29 DIAGNOSIS — IMO0001 Reserved for inherently not codable concepts without codable children: Secondary | ICD-10-CM | POA: Insufficient documentation

## 2011-12-29 DIAGNOSIS — M25619 Stiffness of unspecified shoulder, not elsewhere classified: Secondary | ICD-10-CM | POA: Insufficient documentation

## 2011-12-29 DIAGNOSIS — M25519 Pain in unspecified shoulder: Secondary | ICD-10-CM | POA: Insufficient documentation

## 2012-01-06 ENCOUNTER — Telehealth: Payer: Self-pay | Admitting: *Deleted

## 2012-01-06 NOTE — Telephone Encounter (Signed)
Left a message for patient that labs are due on 01/10/12.

## 2012-01-06 NOTE — Telephone Encounter (Signed)
Message copied by Daphine Deutscher on Thu Jan 06, 2012  8:40 AM ------      Message from: Daphine Deutscher      Created: Thu Dec 02, 2011  1:33 PM       Call and remind patient due for CBC on 01/09/11(DB)

## 2012-01-17 ENCOUNTER — Telehealth: Payer: Self-pay | Admitting: *Deleted

## 2012-01-17 ENCOUNTER — Other Ambulatory Visit (INDEPENDENT_AMBULATORY_CARE_PROVIDER_SITE_OTHER): Payer: Medicare Other

## 2012-01-17 DIAGNOSIS — D649 Anemia, unspecified: Secondary | ICD-10-CM

## 2012-01-17 LAB — CBC WITH DIFFERENTIAL/PLATELET
Basophils Absolute: 0 10*3/uL (ref 0.0–0.1)
Basophils Relative: 0.5 % (ref 0.0–3.0)
Eosinophils Absolute: 0 10*3/uL (ref 0.0–0.7)
Eosinophils Relative: 0.4 % (ref 0.0–5.0)
HCT: 38.5 % (ref 36.0–46.0)
Hemoglobin: 13.3 g/dL (ref 12.0–15.0)
Lymphocytes Relative: 18.7 % (ref 12.0–46.0)
Lymphs Abs: 1.5 10*3/uL (ref 0.7–4.0)
MCHC: 34.5 g/dL (ref 30.0–36.0)
MCV: 92.7 fl (ref 78.0–100.0)
Monocytes Absolute: 0.6 10*3/uL (ref 0.1–1.0)
Monocytes Relative: 7.4 % (ref 3.0–12.0)
Neutro Abs: 6 10*3/uL (ref 1.4–7.7)
Neutrophils Relative %: 73 % (ref 43.0–77.0)
Platelets: 296 10*3/uL (ref 150.0–400.0)
RBC: 4.16 Mil/uL (ref 3.87–5.11)
RDW: 13 % (ref 11.5–14.6)
WBC: 8.2 10*3/uL (ref 4.5–10.5)

## 2012-01-17 NOTE — Telephone Encounter (Signed)
Message copied by Daphine Deutscher on Mon Jan 17, 2012 10:04 AM ------      Message from: Daphine Deutscher      Created: Thu Jan 06, 2012  9:05 AM       Did patient come for CBC for DB

## 2012-01-17 NOTE — Telephone Encounter (Signed)
Left a message for patient that labs are due(2nd Call)

## 2012-01-18 ENCOUNTER — Encounter: Payer: Self-pay | Admitting: Cardiovascular Disease

## 2012-01-18 ENCOUNTER — Ambulatory Visit (INDEPENDENT_AMBULATORY_CARE_PROVIDER_SITE_OTHER): Payer: Medicare Other | Admitting: Cardiovascular Disease

## 2012-01-18 VITALS — BP 138/81 | HR 85 | Ht 61.0 in | Wt 133.0 lb

## 2012-01-18 DIAGNOSIS — R0600 Dyspnea, unspecified: Secondary | ICD-10-CM | POA: Insufficient documentation

## 2012-01-18 DIAGNOSIS — I351 Nonrheumatic aortic (valve) insufficiency: Secondary | ICD-10-CM

## 2012-01-18 DIAGNOSIS — I359 Nonrheumatic aortic valve disorder, unspecified: Secondary | ICD-10-CM

## 2012-01-18 DIAGNOSIS — R002 Palpitations: Secondary | ICD-10-CM

## 2012-01-18 DIAGNOSIS — R0609 Other forms of dyspnea: Secondary | ICD-10-CM

## 2012-01-18 DIAGNOSIS — R0989 Other specified symptoms and signs involving the circulatory and respiratory systems: Secondary | ICD-10-CM

## 2012-01-18 NOTE — Assessment & Plan Note (Signed)
Mild by echo in 2011. Will repeat echo in one year.

## 2012-01-18 NOTE — Patient Instructions (Signed)
Your physician wants you to follow-up in:  12 months. You will receive a reminder letter in the mail two months in advance. If you don't receive a letter, please call our office to schedule the follow-up appointment.  Your physician has requested that you have an echocardiogram. Echocardiography is a painless test that uses sound waves to create images of your heart. It provides your doctor with information about the size and shape of your heart and how well your heart's chambers and valves are working. This procedure takes approximately one hour. There are no restrictions for this procedure. To be done in 1-2 months.     

## 2012-01-18 NOTE — Progress Notes (Signed)
History of Present Illness:  78 yo WF with history of HTN, hyperlipidemia, OA, GERD here today for cardiac follow up. I last saw her in May 2011. She described SOB and dyspnea with minimal exertion. She had no dizziness or chest pain but she did notice palpitations and awareness of heavy heart beats. No clear awareness of irregularity of her heart rhythm. TSH normal in Dr. Ferd Hibbs office. She had an echocardiogram in 2011 that showed normal LV size with posterior wall hypokinesis, mild concentric LVH, diastolic dysfunction, EF 60-65%. Aortic valve sclerosis without stenosis. Mild AI, MR,TR. I ordered an exercise stress myoview and a Holter monitor. Her stress test in April 2011 showed no evidence of  ischemia. LV function was vigorous. Her Holter showed rare PVCs.    She tells me today that she has been doing well. She has had endoscopy with Dr. Lina Sar and has diverticular disease. Her ferritin level was elevated and her iron has been held. She has had no further dyspnea or awareness of palpitations. No chest pain. She has a migraine headache today but no other complaints.   Past Medical History  Diagnosis Date  . Hypertension   . Anemia   . Hyperlipidemia   . Arthritis   . Diverticulosis   . Gastritis   . Hiatal hernia   . Barrett esophagus     Past Surgical History  Procedure Date  . Cesarean section     x 3  . Tubal ligation   . Facial cosmetic surgery     Current Outpatient Prescriptions  Medication Sig Dispense Refill  . amitriptyline (ELAVIL) 25 MG tablet Take 25 mg by mouth at bedtime.       Marland Kitchen atorvastatin (LIPITOR) 20 MG tablet Take 20 mg by mouth daily.        . benazepril (LOTENSIN) 20 MG tablet Take 20 mg by mouth daily.        . cholecalciferol (VITAMIN D) 1000 UNITS tablet Take 1,000 Units by mouth daily.        . hydrochlorothiazide (,MICROZIDE/HYDRODIURIL,) 12.5 MG capsule Take 12.5 mg by mouth daily. Take 1 tab every other day.      Marland Kitchen omeprazole (PRILOSEC) 20  MG capsule Take 20 mg by mouth daily. 1 cap before breakfast.       . raloxifene (EVISTA) 60 MG tablet Take 60 mg by mouth daily.        . traMADol (ULTRAM) 50 MG tablet Take 50 mg by mouth every 6 (six) hours as needed.          Allergies  Allergen Reactions  . Codeine     History   Social History  . Marital Status: Widowed    Spouse Name: N/A    Number of Children: 3  . Years of Education: N/A   Occupational History  . Not on file.   Social History Main Topics  . Smoking status: Never Smoker   . Smokeless tobacco: Not on file  . Alcohol Use: Yes     occasional  . Drug Use: No  . Sexually Active: Not on file   Other Topics Concern  . Not on file   Social History Narrative  . No narrative on file    Family History  Problem Relation Age of Onset  . Heart disease Mother   . Heart disease Father   . Breast cancer Sister   . Colon cancer Neg Hx   . Diabetes Sister   . Kidney disease Sister   .  Stroke Mother     Review of Systems:  As stated in the HPI and otherwise negative.   BP 138/81  Pulse 85  Ht 5\' 1"  (1.549 m)  Wt 133 lb (60.328 kg)  BMI 25.13 kg/m2  Physical Examination: General: Well developed, well nourished, NAD HEENT: OP clear, mucus membranes moist SKIN: warm, dry. No rashes. Neuro: No focal deficits Musculoskeletal: Muscle strength 5/5 all ext Psychiatric: Mood and affect normal Neck: No JVD, no carotid bruits, no thyromegaly, no lymphadenopathy. Lungs:Clear bilaterally, no wheezes, rhonci, crackles Cardiovascular: Regular rate and rhythm. No murmurs, gallops or rubs. Abdomen:Soft. Bowel sounds present. Non-tender.  Extremities: No lower extremity edema. Pulses are 2 + in the bilateral DP/PT.  EKG: NSR, rate 80 bpm.   Stress myoview 04/08/10:  Stress Procedure   The patient exercised for  5:15.  The patient stopped due to fatigue.  She c/o chest pain/pressure, 6/10, that was quickly relieved with rest.  There were no diagnostic ST-T  wave changes; only occasional PVC's.  Myoview was injected at peak exercise and myocardial perfusion imaging was performed after a brief delay.  QPS  Raw Data Images:  Acuisition technically good; normal left ventricular size. Stress Images:  There is mild decreased uptake in the distal anterior wall. Rest Images:  There is mild decreased uptake in the distal anterior wall. Subtraction (SDS):  No evidence of ischemia. Transient Ischemic Dilatation:  .94  (Normal <1.22)  Lung/Heart Ratio:  .32  (Normal <0.45)  Quantitative Gated Spect Images  QGS EDV:  51 ml QGS ESV:  7 ml QGS EF:  86 % QGS cine images:  Normal wall motion.   Overall Impression   Exercise Capacity: Fair exercise capacity. BP Response: Normal blood pressure response. Clinical Symptoms: There is chest pain ECG Impression: No significant ST segment change suggestive of ischemia. Overall Impression: There is mild soft tissue attenuation but no sign of scar or ischemia.

## 2012-01-18 NOTE — Assessment & Plan Note (Signed)
Resolved. Premature beats on Holter monitor in 2011.

## 2012-01-18 NOTE — Assessment & Plan Note (Signed)
Resolved. Normal LV function by stress [facility] April 2011.

## 2012-02-17 ENCOUNTER — Telehealth: Payer: Self-pay | Admitting: Internal Medicine

## 2012-02-17 NOTE — Telephone Encounter (Signed)
Spoke with patient and gave her CBC results from Jan.

## 2012-04-28 ENCOUNTER — Emergency Department (HOSPITAL_BASED_OUTPATIENT_CLINIC_OR_DEPARTMENT_OTHER)
Admission: EM | Admit: 2012-04-28 | Discharge: 2012-04-28 | Disposition: A | Discharge status: Home / Self Care | Payer: Medicare Other | Attending: Emergency Medicine | Admitting: Emergency Medicine

## 2012-04-28 ENCOUNTER — Encounter (HOSPITAL_BASED_OUTPATIENT_CLINIC_OR_DEPARTMENT_OTHER): Payer: Self-pay | Admitting: *Deleted

## 2012-04-28 DIAGNOSIS — E785 Hyperlipidemia, unspecified: Secondary | ICD-10-CM | POA: Insufficient documentation

## 2012-04-28 DIAGNOSIS — R1013 Epigastric pain: Secondary | ICD-10-CM

## 2012-04-28 DIAGNOSIS — I10 Essential (primary) hypertension: Secondary | ICD-10-CM | POA: Insufficient documentation

## 2012-04-28 DIAGNOSIS — M129 Arthropathy, unspecified: Secondary | ICD-10-CM | POA: Insufficient documentation

## 2012-04-28 DIAGNOSIS — R079 Chest pain, unspecified: Secondary | ICD-10-CM | POA: Insufficient documentation

## 2012-04-28 LAB — CBC
HCT: 36.5 % (ref 36.0–46.0)
Hemoglobin: 12.9 g/dL (ref 12.0–15.0)
MCH: 32 pg (ref 26.0–34.0)
MCHC: 35.3 g/dL (ref 30.0–36.0)
MCV: 90.6 fL (ref 78.0–100.0)
Platelets: 236 10*3/uL (ref 150–400)
RBC: 4.03 MIL/uL (ref 3.87–5.11)
RDW: 11.8 % (ref 11.5–15.5)
WBC: 6.7 10*3/uL (ref 4.0–10.5)

## 2012-04-28 LAB — COMPREHENSIVE METABOLIC PANEL
ALT: 20 U/L (ref 0–35)
AST: 33 U/L (ref 0–37)
Albumin: 3.9 g/dL (ref 3.5–5.2)
Alkaline Phosphatase: 113 U/L (ref 39–117)
BUN: 9 mg/dL (ref 6–23)
CO2: 26 mEq/L (ref 19–32)
Calcium: 9.3 mg/dL (ref 8.4–10.5)
Chloride: 97 mEq/L (ref 96–112)
Creatinine, Ser: 0.5 mg/dL (ref 0.50–1.10)
GFR calc Af Amer: >90 mL/min (ref >90)
GFR calc non Af Amer: >90 mL/min (ref >90)
Glucose, Bld: 134 mg/dL — ABNORMAL HIGH (ref 70–99)
Potassium: 3.5 mEq/L (ref 3.5–5.1)
Sodium: 134 mEq/L — ABNORMAL LOW (ref 135–145)
Total Bilirubin: 0.4 mg/dL (ref 0.3–1.2)
Total Protein: 6.8 g/dL (ref 6.0–8.3)

## 2012-04-28 LAB — TROPONIN I
Troponin I: <0.3 ng/mL (ref <0.30)
Troponin I: <0.3 ng/mL (ref <0.30)

## 2012-04-28 LAB — DIFFERENTIAL
Basophils Absolute: 0 10*3/uL (ref 0.0–0.1)
Basophils Relative: 0 % (ref 0–1)
Eosinophils Absolute: 0.1 K/uL (ref 0.0–0.7)
Eosinophils Relative: 1 % (ref 0–5)
Lymphocytes Relative: 17 % (ref 12–46)
Lymphs Abs: 1.1 10*3/uL (ref 0.7–4.0)
Monocytes Absolute: 0.6 10*3/uL (ref 0.1–1.0)
Monocytes Relative: 9 % (ref 3–12)
Neutro Abs: 4.9 10*3/uL (ref 1.7–7.7)
Neutrophils Relative %: 73 % (ref 43–77)

## 2012-04-28 LAB — LIPASE, BLOOD: Lipase: 36 U/L (ref 11–59)

## 2012-04-28 NOTE — ED Notes (Signed)
Report given to Martha Clan RN

## 2012-04-28 NOTE — Discharge Instructions (Signed)
Heart Attack in Women Heart attack (myocardial infarction) is one of the leading causes of sudden, unexpected death in women. Early recognition of heart attack symptoms is critical. Do not ignore heart attack symptoms. If you experience heart attack symptoms, get immediate help. Early treatment helps reduce heart damage. CAUSES  A heart attack happens because the heart (coronary) arteries become blocked by fatty deposits (plaque) or blood clots. This reduces the oxygen and blood supply to the heart. When one or more of the heart arteries becomes blocked, that area of the heart will begin to die, causing the pain felt during a heart attack.  RISK FACTORS In women, as the level of estrogen in the blood decreases after menopause, the risk of heart attack increases. Other risk factors of heart attack in women include:  High blood pressure.   High cholesterol levels.   Diabetes.   Smoking.   Obesity.   Menopause.   Hysterectomy.   Previous heart attack.   Lack of regular exercise.   Family history of heart attacks.  SYMPTOMS  In women, heart attack symptoms may be different than those in men. Women may not experience the typical chest discomfort or pain, which is considered the primary heart attack symptom in men. Women may describe a feeling of pressure, ache, or tightness in the chest. Women may experience new or different physical symptoms sometimes a month or more before a heart attack. Unusual, unexplained fatigue may be the most frequently identified symptom. Sleep disturbances and weakness in the arms may also be considered warning signs.  Other heart attack symptoms that may occur more often in women are:  Unexplained feelings of nervousness or anxiety.   Discomfort between the shoulder blades.   Tingling in the hands and arms.   Swollen arms.   Headaches.  Heart attack symptoms for both men and women include:  Pain or discomfort spreading to the neck, shoulder, arm, or  jaw.   Shortness of breath.   Sudden cold sweats.   Pain or discomfort in the abdomen.   Heartburn or indigestion with or without vomiting.   Sudden lightheadedness.   Sudden fainting or blackout.  PREVENTION The following healthy lifestyle habits may help decrease your risk of heart attacks:  Quitting smoking.   Keeping your blood pressure, blood sugar, and cholesterol levels within normal limits.   Maintaining a healthy weight.   Staying physically active and exercising regularly.   Decreasing your salt intake.   Eating a diet low in saturated fats and cholesterol.   Increasing your fiber intake by including whole grains, vegetables, and fruits in your diet.   Avoiding situations that cause stress, anger, or depression.   Taking medicine as advised by your caregiver.  SEEK IMMEDIATE MEDICAL CARE IF:   You have pain or discomfort in the middle of your chest.   You have pain or discomfort in the upper part of your body, such as the arms, back, neck, jaw, or stomach.   You develop shortness of breath.   You break out into a cold sweat.   You feel nauseous or lightheaded.   You have a fainting episode.   You feel very unusual weakness.   You feel that your heart is pounding very hard or is beating irregularly.  MAKE SURE YOU:   Understand these instructions.   Will watch your condition.   Will get help right away if you are not doing well or get worse.  Document Released: 06/03/2008 Document Revised: 11/25/2011 Document  Reviewed: 06/03/2008 ExitCare Patient Information 2012 [place], [place].      Please follow up with Dr. Timothy Lasso as scheduled.  I also strongly recommend that you follow up with Dr. Sanjuana Kava, your cardiologist.  Return to any emergency department for any worsening symptoms or concerns.

## 2012-04-28 NOTE — ED Notes (Signed)
Re'cd report from [facility].

## 2012-04-28 NOTE — ED Provider Notes (Signed)
History     CSN: [id number]  Arrival date & time 04/28/12  1304   First MD Initiated Contact with Patient 04/28/12 1304      Chief Complaint  Patient presents with  . Abdominal Pain    epigastic    (Consider location/radiation/quality/duration/timing/severity/associated sxs/prior treatment) HPI Comments: Pt had gone in this AM to get blood drawn at PCP office, then went to [facility] and eaten a sausage buiscuit around [id number].  She has h/o GERD, esophagitis in the past, also HTN, hyperlipidemia, TR and MR but no CAD, developed indigestion like pain with pressure in lower chest and upper abdomen, no radiation to back or arms, slightly clammy, no N/V and no SOB about 1 hour after eating.  She takes a PPI daily and has been compliant.  She took 2 Tums and about 20-30 minutes afterwards, had complete relief.  However by then, she had already called 911 and EMS was there and told her she should probably be checked out.  She reports feeling normal now.  She denies any sweats, SOB, pain, nausea currently.  She saw her cardiologist about 2-3 months ago for routine check.  She sees her PMD Dr. Timothy Lasso next week for regular check up.  She denies fevers, coughing, pleurisy.  She denies any recent exertional CP or SOB.    Patient is a 78 y.o. female presenting with abdominal pain. The history is provided by the patient and the EMS personnel.  Abdominal Pain The primary symptoms of the illness include abdominal pain. The primary symptoms of the illness do not include shortness of breath, nausea, vomiting or diarrhea.  Symptoms associated with the illness do not include constipation or back pain.    Past Medical History  Diagnosis Date  . Hypertension   . Anemia   . Hyperlipidemia   . Arthritis   . Diverticulosis   . Gastritis   . Hiatal hernia   . Barrett esophagus     Past Surgical History  Procedure Date  . Cesarean section     x 3  . Tubal ligation   . Facial cosmetic surgery     Family  History  Problem Relation Age of Onset  . Heart disease Mother   . Heart disease Father   . Breast cancer Sister   . Colon cancer Neg Hx   . Diabetes Sister   . Kidney disease Sister   . Stroke Mother     History  Substance Use Topics  . Smoking status: Never Smoker   . Smokeless tobacco: Not on file  . Alcohol Use: Yes     occasional    OB History    Grav Para Term Preterm Abortions TAB SAB Ect Mult Living                  Review of Systems  Constitutional: Negative for appetite change.  HENT: Negative for sore throat and trouble swallowing.   Respiratory: Negative for shortness of breath.   Cardiovascular: Positive for chest pain.  Gastrointestinal: Positive for abdominal pain. Negative for nausea, vomiting, diarrhea, constipation and blood in stool.  Musculoskeletal: Negative for back pain and arthralgias.  Skin: Negative for color change and rash.  Neurological: Negative for dizziness and light-headedness.  All other systems reviewed and are negative.    Allergies  Codeine  Home Medications   Current Outpatient Rx  Name Route Sig Dispense Refill  . ACETAMINOPHEN 500 MG PO TABS Oral Take 500 mg by mouth every 6 (six) hours  as needed. Patient used this medication for her pain.    Marland Kitchen AMITRIPTYLINE HCL 25 MG PO TABS Oral Take 25 mg by mouth at bedtime.     . ATORVASTATIN CALCIUM 20 MG PO TABS Oral Take 20 mg by mouth daily.      Marland Kitchen BENAZEPRIL HCL 20 MG PO TABS Oral Take 20 mg by mouth daily.      Marland Kitchen BRIMONIDINE TARTRATE-TIMOLOL 0.2-0.5 % OP SOLN Right Eye Place 1 drop into the right eye every 12 (twelve) hours.    Marland Kitchen VITAMIN D 1000 UNITS PO TABS Oral Take 1,000 Units by mouth daily.      Marland Kitchen HYDROCHLOROTHIAZIDE 12.5 MG PO CAPS Oral Take 12.5 mg by mouth daily. Take 1 tab every other day.    Marland Kitchen OMEPRAZOLE 20 MG PO CPDR Oral Take 20 mg by mouth daily. 1 cap before breakfast.     . RALOXIFENE HCL 60 MG PO TABS Oral Take 60 mg by mouth daily.      . TRAMADOL HCL 50 MG PO  TABS Oral Take 50 mg by mouth every 6 (six) hours as needed.      . TRAVOPROST (BAK FREE) 0.004 % OP SOLN Right Eye Place 1 drop into the right eye at bedtime.      BP 168/78  Pulse 70  Temp(Src) 98.4 F (36.9 C) (Oral)  SpO2 99%  Physical Exam  Nursing note and vitals reviewed. Constitutional: She appears well-developed and well-nourished.  Non-toxic appearance. She does not have a sickly appearance. She does not appear ill. No distress.  HENT:  Head: Normocephalic and atraumatic.  Eyes: EOM are normal. Pupils are equal, round, and reactive to light. No scleral icterus.  Neck: Neck supple. No JVD present.  Cardiovascular: Normal rate.   Pulmonary/Chest: Effort normal and breath sounds normal. No stridor. No respiratory distress.  Abdominal: Soft. She exhibits no distension. There is no tenderness. There is no rebound.  Musculoskeletal: She exhibits no edema and no tenderness.  Neurological: She is alert. Coordination normal.  Skin: Skin is warm and dry. No rash noted. She is not diaphoretic.    ED Course  Procedures (including critical care time)  RA sat is 98% and normal.  Labs Reviewed  COMPREHENSIVE METABOLIC PANEL - Abnormal; Notable for the following:    Sodium 134 (*)    Glucose, Bld 134 (*)    All other components within normal limits  TROPONIN I  CBC  DIFFERENTIAL  LIPASE, BLOOD  TROPONIN I   No results found.   1. Epigastric pain     ECG at time 13:09 shows NSR at rate 73, RBBB, normal axis, no ST or T wave abn's.      3:15 PM Continues to remain pain free, no CP, SOB.  Troponin times 2 are both <0.30.  I discussed with Dr. Clelia Croft who will pass on message to Dr. Timothy Lasso.  MDM  I reviewed [facility] note from 01/18/12.  It references that she had a normal stress Myoview in 2011.  She has no h/o CAD.  She has a h/o palpitations and likely related to mild TR and MR noted on ECHO.  Her symptoms are resolved.  She ate a sausage biscuit recently which may  have simply caused indigestion.  Will get troponin's serially to check delta.  If neg, pt has follow up with Dr. Timothy Lasso next week, will let him know.            Gavin Pound. Oletta Lamas, MD  04/28/12 1557 

## 2012-04-28 NOTE — ED Notes (Signed)
Epigastric pain onset couple hours  after eating spicy biscuit when ems arrived pain down to 6/10 before arrival now states is pain free

## 2013-04-16 ENCOUNTER — Encounter: Payer: Self-pay | Admitting: Internal Medicine

## 2013-08-22 ENCOUNTER — Encounter: Payer: Self-pay | Admitting: Internal Medicine

## 2013-10-15 ENCOUNTER — Ambulatory Visit (AMBULATORY_SURGERY_CENTER): Payer: Self-pay | Admitting: *Deleted

## 2013-10-15 VITALS — Ht 61.0 in | Wt 133.0 lb

## 2013-10-15 DIAGNOSIS — K2270 Barrett's esophagus without dysplasia: Secondary | ICD-10-CM

## 2013-10-15 NOTE — Progress Notes (Signed)
Patient denies any allergies to eggs or soy. Patient denies any problems with anesthesia.  

## 2013-10-22 ENCOUNTER — Encounter: Payer: Self-pay | Admitting: Internal Medicine

## 2013-10-31 ENCOUNTER — Encounter: Payer: Self-pay | Admitting: Internal Medicine

## 2013-10-31 ENCOUNTER — Ambulatory Visit (AMBULATORY_SURGERY_CENTER): Payer: Medicare Other | Admitting: Internal Medicine

## 2013-10-31 VITALS — BP 166/87 | HR 70 | Temp 98.4°F | Resp 19 | Ht 61.0 in | Wt 133.0 lb

## 2013-10-31 DIAGNOSIS — K2289 Other specified disease of esophagus: Secondary | ICD-10-CM

## 2013-10-31 DIAGNOSIS — K228 Other specified diseases of esophagus: Secondary | ICD-10-CM

## 2013-10-31 DIAGNOSIS — K219 Gastro-esophageal reflux disease without esophagitis: Secondary | ICD-10-CM

## 2013-10-31 DIAGNOSIS — K5710 Diverticulosis of small intestine without perforation or abscess without bleeding: Secondary | ICD-10-CM

## 2013-10-31 DIAGNOSIS — K2270 Barrett's esophagus without dysplasia: Secondary | ICD-10-CM

## 2013-10-31 MED ORDER — SODIUM CHLORIDE 0.9 % IV SOLN: 500.0000 mL | INTRAVENOUS | Status: DC

## 2013-10-31 NOTE — Patient Instructions (Addendum)
YOU HAD AN ENDOSCOPIC PROCEDURE TODAY AT [facility]: Refer to the procedure report that was given to you for any specific questions about what was found during the examination.  If the procedure report does not answer your questions, please call your gastroenterologist to clarify.  If you requested that your care partner not be given the details of your procedure findings, then the procedure report has been included in a sealed envelope for you to review at your convenience later.  YOU SHOULD EXPECT: Some feelings of bloating in the abdomen. Passage of more gas than usual.  Walking can help get rid of the air that was put into your GI tract during the procedure and reduce the bloating. If you had a lower endoscopy (such as a colonoscopy or flexible sigmoidoscopy) you may notice spotting of blood in your stool or on the toilet paper. If you underwent a bowel prep for your procedure, then you may not have a normal bowel movement for a few days.  DIET: Your first meal following the procedure should be a light meal and then it is ok to progress to your normal diet.  A half-sandwich or bowl of soup is an example of a good first meal.  Heavy or fried foods are harder to digest and may make you feel nauseous or bloated.  Likewise meals heavy in dairy and vegetables can cause extra gas to form and this can also increase the bloating.  Drink plenty of fluids but you should avoid alcoholic beverages for 24 hours.  ACTIVITY: Your care partner should take you home directly after the procedure.  You should plan to take it easy, moving slowly for the rest of the day.  You can resume normal activity the day after the procedure however you should NOT DRIVE or use heavy machinery for 24 hours (because of the sedation medicines used during the test).    SYMPTOMS TO REPORT IMMEDIATELY: A gastroenterologist can be reached at any hour.  During normal business hours, 8:30 AM to 5:00 PM Monday through Friday,  call [phone].  After hours and on weekends, please call the GI answering service at [phone]  Emergency number who will take a message and have the physician on call contact you.   Following upper endoscopy (EGD)  Vomiting of blood or coffee ground material  New chest pain or pain under the shoulder blades  Painful or persistently difficult swallowing  New shortness of breath  Fever of 100F or higher  Black, tarry-looking stools  FOLLOW UP: If any biopsies were taken you will be contacted by phone or by letter within the next 1-3 weeks.  Call your gastroenterologist if you have not heard about the biopsies in 3 weeks.  Our staff will call the home number listed on your records the next business day following your procedure to check on you and address any questions or concerns that you may have at that time regarding the information given to you following your procedure. This is a courtesy call and so if there is no answer at the home number and we have not heard from you through the emergency physician on call, we will assume that you have returned to your regular daily activities without incident.  SIGNATURES/CONFIDENTIALITY: You and/or your care partner have signed paperwork which will be entered into your electronic medical record.  These signatures attest to the fact that that the information above on your After Visit Summary has been reviewed and is understood.  Full responsibility of the confidentiality of this discharge information lies with you and/or your care-partner.  Continue omeprazole as directed Reflux regimen handout given

## 2013-10-31 NOTE — Progress Notes (Signed)
Called to room to assist during endoscopic procedure.  Patient ID and intended procedure confirmed with present staff. Received instructions for my participation in the procedure from the performing physician.  

## 2013-10-31 NOTE — Progress Notes (Signed)
Patient did not experience any of the following events: a burn prior to discharge; a fall within the facility; wrong site/side/patient/procedure/implant event; or a hospital transfer or hospital admission upon discharge from the facility. (G8907) Patient did not have preoperative order for IV antibiotic SSI prophylaxis. (G8918)  

## 2013-10-31 NOTE — Op Note (Signed)
[facility] [address] [place], [address]   ENDOSCOPY PROCEDURE REPORT  PATIENT: Terri, Shah  MR#: [id number] BIRTHDATE: 30-Apr-1934 , 79  yrs. old GENDER: Female ENDOSCOPIST: Hart Carwin, MD REFERRED BY:  Creola Corn, M.D. PROCEDURE DATE:  10/31/2013 PROCEDURE:  EGD w/ biopsy ASA CLASS:     Class III INDICATIONS:  history of Barrett's esophagus.,Barrett's esophagus in 2008. No Barrett's esophagus found in November 2010. History of fall iron deficiency anemia. MEDICATIONS: MAC sedation, administered by CRNA and Propofol (Diprivan) 120 mg IV TOPICAL ANESTHETIC: Cetacaine Spray  DESCRIPTION OF PROCEDURE: After the risks benefits and alternatives of the procedure were thoroughly explained, informed consent was obtained.  The LB [id number] endoscope was introduced through the mouth and advanced to the second portion of the duodenum. Without limitations.  The instrument was slowly withdrawn as the mucosa was fully examined.      Esophagus: upper esophageal sphincter was spastic and initially did not allow the endoscope to traverse. It was successfully intubated and the esophageal lumen appeared tortuous. There were no secretions of food but the lumen will also eccentric and there was some spasm at the area of the lower esophageal sphincter. There was no definite stricture[ .gastroesophageal junction appeared normal biopsies were taken from the GE junction. There was no significant hiatal hernia Stomach gastric mucosa appeared normal. Gastric antrum and body of the stomach appeared normal. Retroflexion of the endoscope revealed the normal fundus and cardia. Pyloric outlet was normal Duodenum duodenum bulb was normal. 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 Descending duodenal was normal         The scope was then withdrawn from the patient and the procedure completed.  COMPLICATIONS: There were no complications. ENDOSCOPIC  IMPRESSION:   Presbyesophagus History of Barrett's esophagus status post biopsies 2 followup on intestinal metaplasia Duodenal diverticulum RECOMMENDATIONS: 1.  Await pathology results 2.  Anti-reflux regimen to be followed 3.  Continue PPI 4. Recall upper endoscopy pending biopsies     eSigned:  Hart Carwin, MD 10/31/2013 12:33 PM   CC:  PATIENT NAME:  Terri, Shah MR#: [id number]

## 2013-10-31 NOTE — Progress Notes (Signed)
Report to pacu rn, vss, bbs=clear 

## 2013-11-01 ENCOUNTER — Telehealth: Payer: Self-pay

## 2013-11-01 NOTE — Telephone Encounter (Signed)
  Follow up Call-  Call back number 10/31/2013  Post procedure Call Back phone  # [phone]  Permission to leave phone message Yes     Patient questions:  Do you have a fever, pain , or abdominal swelling? no Pain Score  0 *  Have you tolerated food without any problems? yes  Have you been able to return to your normal activities? yes  Do you have any questions about your discharge instructions: Diet   no Medications  no Follow up visit  no  Do you have questions or concerns about your Care? no  Actions: * If pain score is 4 or above: No action needed, pain <4.

## 2013-11-05 ENCOUNTER — Encounter: Payer: Self-pay | Admitting: Internal Medicine

## 2014-06-03 ENCOUNTER — Telehealth: Payer: Self-pay | Admitting: Internal Medicine

## 2014-06-03 ENCOUNTER — Other Ambulatory Visit (INDEPENDENT_AMBULATORY_CARE_PROVIDER_SITE_OTHER): Payer: Commercial Managed Care - HMO

## 2014-06-03 DIAGNOSIS — K625 Hemorrhage of anus and rectum: Secondary | ICD-10-CM

## 2014-06-03 LAB — CBC WITH DIFFERENTIAL/PLATELET
Basophils Absolute: 0 10*3/uL (ref 0.0–0.1)
Basophils Relative: 0.4 % (ref 0.0–3.0)
Eosinophils Absolute: 0.1 10*3/uL (ref 0.0–0.7)
Eosinophils Relative: 1 % (ref 0.0–5.0)
HCT: 35.7 % — ABNORMAL LOW (ref 36.0–46.0)
Hemoglobin: 12.1 g/dL (ref 12.0–15.0)
Lymphocytes Relative: 24.2 % (ref 12.0–46.0)
Lymphs Abs: 1.7 10*3/uL (ref 0.7–4.0)
MCHC: 33.9 g/dL (ref 30.0–36.0)
MCV: 93.9 fl (ref 78.0–100.0)
Monocytes Absolute: 0.6 10*3/uL (ref 0.1–1.0)
Monocytes Relative: 8.2 % (ref 3.0–12.0)
Neutro Abs: 4.7 10*3/uL (ref 1.4–7.7)
Neutrophils Relative %: 66.2 % (ref 43.0–77.0)
Platelets: 287 10*3/uL (ref 150.0–400.0)
RBC: 3.8 Mil/uL — ABNORMAL LOW (ref 3.87–5.11)
RDW: 12.3 % (ref 11.5–15.5)
WBC: 7.1 10*3/uL (ref 4.0–10.5)

## 2014-06-03 LAB — IBC PANEL
Iron: 72 ug/dL (ref 42–145)
Saturation Ratios: 23.9 % (ref 20.0–50.0)
Transferrin: 215.1 mg/dL (ref 212.0–360.0)

## 2014-06-03 NOTE — Telephone Encounter (Signed)
Spoke with patient and she states she started having rectal bleeding yesterday. Blood is bright, red and occurs with bowel movements. She had 2 episodes of this yesterday and one today. One stool yesterday was diarrhea and dark in color but is not today. She reports this has happened several times in the past and she "just waited it out." Offered OV with extender but she does not want to see an extender. She does want to see what Dr. Olevia Perches thinks. Please, advise.

## 2014-06-03 NOTE — Telephone Encounter (Signed)
Likely diverticular bleed. Many episodes before, last one 2011. P,lease come for CBC, Iron studies today. Stay on liquids x 24 hours.Call with an update.

## 2014-06-03 NOTE — Telephone Encounter (Signed)
Labs in EPIC. Patient notified of recommendations. She will come for labs today.

## 2014-06-04 ENCOUNTER — Encounter: Payer: Self-pay | Admitting: *Deleted

## 2014-06-07 ENCOUNTER — Telehealth: Payer: Self-pay | Admitting: *Deleted

## 2014-06-07 NOTE — Telephone Encounter (Signed)
Patient calling to report bleeding has decreased to only tinged in AM. She is asking if she can eat soft food. She will do this now.

## 2014-06-12 ENCOUNTER — Ambulatory Visit (INDEPENDENT_AMBULATORY_CARE_PROVIDER_SITE_OTHER): Payer: Commercial Managed Care - HMO | Admitting: Internal Medicine

## 2014-06-12 ENCOUNTER — Encounter: Payer: Self-pay | Admitting: Internal Medicine

## 2014-06-12 VITALS — BP 124/72 | HR 70 | Ht 61.0 in | Wt 128.8 lb

## 2014-06-12 DIAGNOSIS — K625 Hemorrhage of anus and rectum: Secondary | ICD-10-CM

## 2014-06-12 DIAGNOSIS — K5731 Diverticulosis of large intestine without perforation or abscess with bleeding: Secondary | ICD-10-CM

## 2014-06-12 NOTE — Progress Notes (Signed)
MILDERD MANOCCHIO 12/04/1934 [id number]  Note: This dictation was prepared with Dragon digital system. Any transcriptional errors that result from this procedure are unintentional.   History of Present Illness:  This is a 80 year old white female with symptomatic diverticulosis and a history of multiple diverticular bleeds starting in 1992, again in 1999 and 2005 when she was evaluated by surgery ( Dr Zella Richer)  for a laparoscopically assisted sigmoid colectomy. She declined. She had 2 episodes of diverticular bleed in 2011 in 2012. She also just had an acute episode which started 2 weeks ago and has subsided as of several days ago. Her hemoglobin dropped to 12.1 with a hematocrit of 35.7. She denies abdominal pain. She takes Metamucil 1 teaspoon daily. There is a history of Barrett's esophagus on an upper endoscopy in November 2010. The most recent upper endoscopy in November 2014 showed a duodenal diverticulum and presbyesophagus but no evidence of Barrett's esophagus. A small bowel capsule endoscopy in 2011 was negative for AV malformations.    Past Medical History  Diagnosis Date  . Hypertension   . Anemia   . Hyperlipidemia   . Arthritis   . Diverticulosis   . Gastritis   . Hiatal hernia   . Barrett esophagus   . Cataract   . History of GI diverticular bleed   . Presbyesophagus   . Iron deficiency anemia     Past Surgical History  Procedure Laterality Date  . Cesarean section      x 3  . Tubal ligation    . Facial cosmetic surgery    . Cataract extraction, bilateral      Allergies  Allergen Reactions  . Codeine Other (See Comments)    "Perception off"     Family history and social history have been reviewed.  Review of Systems: Negative for abdominal pain. Weight loss  The remainder of the 10 point ROS is negative except as outlined in the H&P  Physical Exam: General Appearance Well developed, in no distress Eyes  Non icteric  HEENT  Non traumatic,  normocephalic  Mouth No lesion, tongue papillated, no cheilosis Neck Supple without adenopathy, thyroid not enlarged, no carotid bruits, no JVD Lungs Clear to auscultation bilaterally COR Normal S1, normal S2, regular rhythm, no murmur, quiet precordium Abdomen soft nontender abdomen with normoactive bowel sounds. No palpable mass. Liver edge at costal margin Rectal Small amount of yellow Hemoccult-negative stool. Extremities  No pedal edema Skin No lesions Neurological Alert and oriented x 3 Psychological Normal mood and affect  Assessment and Plan:   Problem #45 80 year old white female with a history of multiple diverticular bleeds which have required blood transfusions. She at one point was evaluated for a sigmoid resection but since then has declined and she is not interested in a sigmoid colectomy. I have discussed a high-fiber diet and again I encouraged her to continue on Metamucil 1 teaspoon daily.    Delfin Edis 06/12/2014

## 2014-06-12 NOTE — Patient Instructions (Signed)
Dr Shon Baton

## 2014-09-11 ENCOUNTER — Telehealth: Payer: Self-pay | Admitting: Internal Medicine

## 2014-09-11 ENCOUNTER — Other Ambulatory Visit (INDEPENDENT_AMBULATORY_CARE_PROVIDER_SITE_OTHER): Payer: Commercial Managed Care - HMO

## 2014-09-11 DIAGNOSIS — K921 Melena: Secondary | ICD-10-CM

## 2014-09-11 LAB — CBC WITH DIFFERENTIAL/PLATELET
Basophils Absolute: 0 10*3/uL (ref 0.0–0.1)
Basophils Relative: 0.5 % (ref 0.0–3.0)
Eosinophils Absolute: 0.1 10*3/uL (ref 0.0–0.7)
Eosinophils Relative: 1.2 % (ref 0.0–5.0)
HCT: 38 % (ref 36.0–46.0)
Hemoglobin: 12.8 g/dL (ref 12.0–15.0)
Lymphocytes Relative: 18.3 % (ref 12.0–46.0)
Lymphs Abs: 1.2 10*3/uL (ref 0.7–4.0)
MCHC: 33.7 g/dL (ref 30.0–36.0)
MCV: 91 fl (ref 78.0–100.0)
Monocytes Absolute: 0.5 10*3/uL (ref 0.1–1.0)
Monocytes Relative: 8 % (ref 3.0–12.0)
Neutro Abs: 4.8 10*3/uL (ref 1.4–7.7)
Neutrophils Relative %: 72 % (ref 43.0–77.0)
Platelets: 294 10*3/uL (ref 150.0–400.0)
RBC: 4.18 Mil/uL (ref 3.87–5.11)
RDW: 12.7 % (ref 11.5–15.5)
WBC: 6.6 10*3/uL (ref 4.0–10.5)

## 2014-09-11 NOTE — Telephone Encounter (Signed)
Patient calling to report she is having bloody stools again. States it started yesterday and she has had it this AM too. Stool is blood and loose. It starts out being dark blood then changes to bright, red. No pain just left side discomfort. Hx diverticular bleeds. Per Dr. Olevia Perches, stat CBC, clear liquid diet.

## 2014-09-13 ENCOUNTER — Telehealth: Payer: Self-pay | Admitting: *Deleted

## 2014-09-13 DIAGNOSIS — K625 Hemorrhage of anus and rectum: Secondary | ICD-10-CM

## 2014-09-13 NOTE — Telephone Encounter (Signed)
Patient is calling to report she is doing better. States she just had a trace of blood yesterday and today.

## 2014-09-16 NOTE — Telephone Encounter (Signed)
Good! Please, recheck CBC  On Wed 09/18/2014.

## 2014-09-17 NOTE — Telephone Encounter (Signed)
Spoke with patient and gave her recommendations. She will come for labs tomorrow.

## 2014-09-17 NOTE — Addendum Note (Signed)
Addended by: Hulan Saas on: 09/17/2014 09:24 AM   Modules accepted: Orders

## 2014-09-17 NOTE — Telephone Encounter (Signed)
Left a message for patient to call me back. Lab in [facility]

## 2014-09-18 ENCOUNTER — Other Ambulatory Visit (INDEPENDENT_AMBULATORY_CARE_PROVIDER_SITE_OTHER): Payer: Commercial Managed Care - HMO

## 2014-09-18 DIAGNOSIS — K625 Hemorrhage of anus and rectum: Secondary | ICD-10-CM

## 2014-09-18 LAB — CBC WITH DIFFERENTIAL/PLATELET
Basophils Absolute: 0 10*3/uL (ref 0.0–0.1)
Basophils Relative: 0.5 % (ref 0.0–3.0)
Eosinophils Absolute: 0.1 10*3/uL (ref 0.0–0.7)
Eosinophils Relative: 1.4 % (ref 0.0–5.0)
HCT: 36.1 % (ref 36.0–46.0)
Hemoglobin: 12.1 g/dL (ref 12.0–15.0)
Lymphocytes Relative: 25.3 % (ref 12.0–46.0)
Lymphs Abs: 1.4 10*3/uL (ref 0.7–4.0)
MCHC: 33.5 g/dL (ref 30.0–36.0)
MCV: 91.9 fl (ref 78.0–100.0)
Monocytes Absolute: 0.7 10*3/uL (ref 0.1–1.0)
Monocytes Relative: 12.3 % — ABNORMAL HIGH (ref 3.0–12.0)
Neutro Abs: 3.3 10*3/uL (ref 1.4–7.7)
Neutrophils Relative %: 60.5 % (ref 43.0–77.0)
Platelets: 292 10*3/uL (ref 150.0–400.0)
RBC: 3.93 Mil/uL (ref 3.87–5.11)
RDW: 13.2 % (ref 11.5–15.5)
WBC: 5.4 10*3/uL (ref 4.0–10.5)

## 2014-10-26 ENCOUNTER — Inpatient Hospital Stay (HOSPITAL_BASED_OUTPATIENT_CLINIC_OR_DEPARTMENT_OTHER)
Admission: EM | Admit: 2014-10-26 | Discharge: 2014-11-04 | DRG: 406 | Disposition: A | Discharge status: Home / Self Care | Payer: Medicare PPO | Attending: General Surgery | Admitting: General Surgery

## 2014-10-26 ENCOUNTER — Emergency Department (HOSPITAL_BASED_OUTPATIENT_CLINIC_OR_DEPARTMENT_OTHER): Payer: Medicare PPO

## 2014-10-26 ENCOUNTER — Encounter (HOSPITAL_BASED_OUTPATIENT_CLINIC_OR_DEPARTMENT_OTHER): Payer: Self-pay | Admitting: *Deleted

## 2014-10-26 DIAGNOSIS — Z833 Family history of diabetes mellitus: Secondary | ICD-10-CM

## 2014-10-26 DIAGNOSIS — R140 Abdominal distension (gaseous): Secondary | ICD-10-CM

## 2014-10-26 DIAGNOSIS — Z419 Encounter for procedure for purposes other than remedying health state, unspecified: Secondary | ICD-10-CM

## 2014-10-26 DIAGNOSIS — Z9841 Cataract extraction status, right eye: Secondary | ICD-10-CM

## 2014-10-26 DIAGNOSIS — Z79891 Long term (current) use of opiate analgesic: Secondary | ICD-10-CM

## 2014-10-26 DIAGNOSIS — R110 Nausea: Secondary | ICD-10-CM

## 2014-10-26 DIAGNOSIS — E871 Hypo-osmolality and hyponatremia: Secondary | ICD-10-CM | POA: Diagnosis not present

## 2014-10-26 DIAGNOSIS — E785 Hyperlipidemia, unspecified: Secondary | ICD-10-CM | POA: Diagnosis present

## 2014-10-26 DIAGNOSIS — R188 Other ascites: Secondary | ICD-10-CM | POA: Diagnosis present

## 2014-10-26 DIAGNOSIS — Z8719 Personal history of other diseases of the digestive system: Secondary | ICD-10-CM

## 2014-10-26 DIAGNOSIS — K2100 Gastro-esophageal reflux disease with esophagitis, without bleeding: Secondary | ICD-10-CM | POA: Diagnosis present

## 2014-10-26 DIAGNOSIS — K219 Gastro-esophageal reflux disease without esophagitis: Secondary | ICD-10-CM | POA: Diagnosis present

## 2014-10-26 DIAGNOSIS — G8929 Other chronic pain: Secondary | ICD-10-CM | POA: Diagnosis present

## 2014-10-26 DIAGNOSIS — I4510 Unspecified right bundle-branch block: Secondary | ICD-10-CM | POA: Diagnosis present

## 2014-10-26 DIAGNOSIS — K2270 Barrett's esophagus without dysplasia: Secondary | ICD-10-CM | POA: Diagnosis present

## 2014-10-26 DIAGNOSIS — K8000 Calculus of gallbladder with acute cholecystitis without obstruction: Principal | ICD-10-CM | POA: Diagnosis present

## 2014-10-26 DIAGNOSIS — Y839 Surgical procedure, unspecified as the cause of abnormal reaction of the patient, or of later complication, without mention of misadventure at the time of the procedure: Secondary | ICD-10-CM | POA: Diagnosis not present

## 2014-10-26 DIAGNOSIS — K913 Postprocedural intestinal obstruction: Secondary | ICD-10-CM | POA: Diagnosis not present

## 2014-10-26 DIAGNOSIS — R112 Nausea with vomiting, unspecified: Secondary | ICD-10-CM

## 2014-10-26 DIAGNOSIS — Z9842 Cataract extraction status, left eye: Secondary | ICD-10-CM

## 2014-10-26 DIAGNOSIS — Z803 Family history of malignant neoplasm of breast: Secondary | ICD-10-CM

## 2014-10-26 DIAGNOSIS — M1990 Unspecified osteoarthritis, unspecified site: Secondary | ICD-10-CM | POA: Diagnosis present

## 2014-10-26 DIAGNOSIS — M797 Fibromyalgia: Secondary | ICD-10-CM | POA: Diagnosis present

## 2014-10-26 DIAGNOSIS — M25511 Pain in right shoulder: Secondary | ICD-10-CM | POA: Diagnosis present

## 2014-10-26 DIAGNOSIS — K828 Other specified diseases of gallbladder: Secondary | ICD-10-CM | POA: Diagnosis present

## 2014-10-26 DIAGNOSIS — Z885 Allergy status to narcotic agent status: Secondary | ICD-10-CM

## 2014-10-26 DIAGNOSIS — K56609 Unspecified intestinal obstruction, unspecified as to partial versus complete obstruction: Secondary | ICD-10-CM

## 2014-10-26 DIAGNOSIS — Z79899 Other long term (current) drug therapy: Secondary | ICD-10-CM

## 2014-10-26 DIAGNOSIS — K567 Ileus, unspecified: Secondary | ICD-10-CM | POA: Diagnosis not present

## 2014-10-26 DIAGNOSIS — Z8249 Family history of ischemic heart disease and other diseases of the circulatory system: Secondary | ICD-10-CM

## 2014-10-26 DIAGNOSIS — K810 Acute cholecystitis: Secondary | ICD-10-CM

## 2014-10-26 DIAGNOSIS — K9189 Other postprocedural complications and disorders of digestive system: Secondary | ICD-10-CM

## 2014-10-26 DIAGNOSIS — Z823 Family history of stroke: Secondary | ICD-10-CM

## 2014-10-26 DIAGNOSIS — I10 Essential (primary) hypertension: Secondary | ICD-10-CM | POA: Diagnosis present

## 2014-10-26 DIAGNOSIS — R079 Chest pain, unspecified: Secondary | ICD-10-CM | POA: Diagnosis present

## 2014-10-26 HISTORY — DX: Unspecified ectopic pregnancy without intrauterine pregnancy: O00.90

## 2014-10-26 HISTORY — DX: Gastroduodenitis, unspecified, without bleeding: K29.90

## 2014-10-26 HISTORY — DX: Gastritis, unspecified, without bleeding: K29.70

## 2014-10-26 HISTORY — DX: Barrett's esophagus without dysplasia: K22.70

## 2014-10-26 HISTORY — DX: Palpitations: R00.2

## 2014-10-26 LAB — COMPREHENSIVE METABOLIC PANEL
ALT: 9 U/L (ref 0–35)
AST: 17 U/L (ref 0–37)
Albumin: 4 g/dL (ref 3.5–5.2)
Alkaline Phosphatase: 62 U/L (ref 39–117)
Anion gap: 18 — ABNORMAL HIGH (ref 5–15)
BUN: 9 mg/dL (ref 6–23)
CO2: 23 mEq/L (ref 19–32)
Calcium: 9.8 mg/dL (ref 8.4–10.5)
Chloride: 90 mEq/L — ABNORMAL LOW (ref 96–112)
Creatinine, Ser: 0.5 mg/dL (ref 0.50–1.10)
GFR calc Af Amer: >90 mL/min (ref >90)
GFR calc non Af Amer: >90 mL/min (ref >90)
Glucose, Bld: 139 mg/dL — ABNORMAL HIGH (ref 70–99)
Potassium: 3.7 mEq/L (ref 3.7–5.3)
Sodium: 131 mEq/L — ABNORMAL LOW (ref 137–147)
Total Bilirubin: 0.6 mg/dL (ref 0.3–1.2)
Total Protein: 7.4 g/dL (ref 6.0–8.3)

## 2014-10-26 LAB — CBC
HCT: 37.2 % (ref 36.0–46.0)
Hemoglobin: 12.8 g/dL (ref 12.0–15.0)
MCH: 30.4 pg (ref 26.0–34.0)
MCHC: 34.4 g/dL (ref 30.0–36.0)
MCV: 88.4 fL (ref 78.0–100.0)
Platelets: 308 10*3/uL (ref 150–400)
RBC: 4.21 MIL/uL (ref 3.87–5.11)
RDW: 12 % (ref 11.5–15.5)
WBC: 12.4 10*3/uL — ABNORMAL HIGH (ref 4.0–10.5)

## 2014-10-26 LAB — URINALYSIS, ROUTINE W REFLEX MICROSCOPIC
Bilirubin Urine: NEGATIVE
Glucose, UA: NEGATIVE mg/dL
Hgb urine dipstick: NEGATIVE
Ketones, ur: 15 mg/dL — AB
Leukocytes, UA: NEGATIVE
Nitrite: NEGATIVE
Protein, ur: NEGATIVE mg/dL
Specific Gravity, Urine: 1.01 (ref 1.005–1.030)
Urobilinogen, UA: 1 mg/dL (ref 0.0–1.0)
pH: 8 (ref 5.0–8.0)

## 2014-10-26 LAB — LIPASE, BLOOD: Lipase: 29 U/L (ref 11–59)

## 2014-10-26 LAB — TROPONIN I: Troponin I: <0.3 ng/mL (ref <0.30)

## 2014-10-26 MED ORDER — FENTANYL CITRATE 0.05 MG/ML IJ SOLN
50.0000 ug | Freq: Once | INTRAMUSCULAR | Status: AC
  Administered 2014-10-26: 50 ug via INTRAVENOUS
  Filled 2014-10-26: qty 2

## 2014-10-26 MED ORDER — ONDANSETRON HCL 4 MG/2ML IJ SOLN
4.0000 mg | Freq: Once | INTRAMUSCULAR | Status: AC
  Administered 2014-10-26: 4 mg via INTRAVENOUS
  Filled 2014-10-26: qty 2

## 2014-10-26 MED ORDER — ZOLPIDEM TARTRATE 5 MG PO TABS
5.0000 mg | ORAL_TABLET | Freq: Every evening | ORAL | Status: DC | PRN
  Filled 2014-10-26: qty 1

## 2014-10-26 MED ORDER — BISACODYL 10 MG RE SUPP: 10.0000 mg | Freq: Two times a day (BID) | RECTAL | Status: DC | PRN

## 2014-10-26 MED ORDER — SODIUM CHLORIDE 0.9 % IV BOLUS (SEPSIS)
1000.0000 mL | Freq: Once | INTRAVENOUS | Status: AC
Start: 2014-10-26 — End: 2014-10-26
  Administered 2014-10-26: 1000 mL via INTRAVENOUS

## 2014-10-26 MED ORDER — CHLORHEXIDINE GLUCONATE 4 % EX LIQD
1.0000 "application " | Freq: Once | CUTANEOUS | Status: AC
  Administered 2014-10-27: 1 via TOPICAL
  Filled 2014-10-26: qty 15

## 2014-10-26 MED ORDER — ONDANSETRON HCL 4 MG PO TABS: 4.0000 mg | ORAL_TABLET | Freq: Four times a day (QID) | ORAL | Status: DC | PRN

## 2014-10-26 MED ORDER — MENTHOL 3 MG MT LOZG
1.0000 | LOZENGE | OROMUCOSAL | Status: DC | PRN
  Administered 2014-10-27: 3 mg via ORAL
  Filled 2014-10-26: qty 9

## 2014-10-26 MED ORDER — ACETAMINOPHEN 325 MG PO TABS: 650.0000 mg | ORAL_TABLET | Freq: Four times a day (QID) | ORAL | Status: DC | PRN

## 2014-10-26 MED ORDER — METOPROLOL TARTRATE 1 MG/ML IV SOLN
5.0000 mg | Freq: Four times a day (QID) | INTRAVENOUS | Status: DC | PRN
  Administered 2014-10-30: 5 mg via INTRAVENOUS
  Filled 2014-10-26 (×2): qty 5

## 2014-10-26 MED ORDER — CHLORHEXIDINE GLUCONATE 4 % EX LIQD
1.0000 "application " | Freq: Once | CUTANEOUS | Status: AC
  Administered 2014-10-26: 1 via TOPICAL
  Filled 2014-10-26: qty 15

## 2014-10-26 MED ORDER — LIP MEDEX EX OINT
1.0000 "application " | TOPICAL_OINTMENT | Freq: Two times a day (BID) | CUTANEOUS | Status: DC
  Administered 2014-10-27 – 2014-11-04 (×16): 1 via TOPICAL
  Filled 2014-10-26 (×2): qty 7

## 2014-10-26 MED ORDER — POLYETHYLENE GLYCOL 3350 17 G PO PACK
17.0000 g | PACK | Freq: Two times a day (BID) | ORAL | Status: DC
  Administered 2014-10-27 – 2014-10-31 (×7): 17 g via ORAL
  Filled 2014-10-26 (×13): qty 1

## 2014-10-26 MED ORDER — LACTATED RINGERS IV BOLUS (SEPSIS): 1000.0000 mL | Freq: Three times a day (TID) | INTRAVENOUS | Status: AC | PRN

## 2014-10-26 MED ORDER — HYDROMORPHONE HCL 1 MG/ML IJ SOLN
0.5000 mg | INTRAMUSCULAR | Status: DC | PRN
Start: 2014-10-26 — End: 2014-10-27
  Administered 2014-10-26 – 2014-10-27 (×4): 1 mg via INTRAVENOUS
  Filled 2014-10-26 (×5): qty 1

## 2014-10-26 MED ORDER — PROMETHAZINE HCL 25 MG/ML IJ SOLN
6.2500 mg | INTRAMUSCULAR | Status: DC | PRN
  Administered 2014-10-30: 6.25 mg via INTRAVENOUS
  Filled 2014-10-26 (×2): qty 1

## 2014-10-26 MED ORDER — SACCHAROMYCES BOULARDII 250 MG PO CAPS
250.0000 mg | ORAL_CAPSULE | Freq: Two times a day (BID) | ORAL | Status: DC
  Administered 2014-10-26 – 2014-11-04 (×16): 250 mg via ORAL
  Filled 2014-10-26 (×20): qty 1

## 2014-10-26 MED ORDER — KCL IN DEXTROSE-NACL 40-5-0.45 MEQ/L-%-% IV SOLN
INTRAVENOUS | Status: DC
  Administered 2014-10-26: via INTRAVENOUS
  Administered 2014-10-27: 100 mL via INTRAVENOUS
  Administered 2014-10-28: via INTRAVENOUS
  Filled 2014-10-26 (×8): qty 1000

## 2014-10-26 MED ORDER — LACTATED RINGERS IV BOLUS (SEPSIS)
1000.0000 mL | Freq: Once | INTRAVENOUS | Status: AC
  Administered 2014-10-26: 1000 mL via INTRAVENOUS

## 2014-10-26 MED ORDER — POLYETHYLENE GLYCOL 3350 17 G PO PACK
17.0000 g | PACK | Freq: Two times a day (BID) | ORAL | Status: DC | PRN
  Filled 2014-10-26 (×2): qty 1

## 2014-10-26 MED ORDER — DEXTROSE 5 % IV SOLN
2.0000 g | INTRAVENOUS | Status: DC
  Administered 2014-10-26 – 2014-10-28 (×3): 2 g via INTRAVENOUS
  Filled 2014-10-26 (×4): qty 2

## 2014-10-26 MED ORDER — DIPHENHYDRAMINE HCL 50 MG/ML IJ SOLN: 12.5000 mg | Freq: Four times a day (QID) | INTRAMUSCULAR | Status: DC | PRN

## 2014-10-26 MED ORDER — TRAMADOL HCL 50 MG PO TABS
50.0000 mg | ORAL_TABLET | Freq: Four times a day (QID) | ORAL | Status: DC | PRN
  Administered 2014-10-27 (×2): 50 mg via ORAL
  Administered 2014-10-28 – 2014-10-30 (×4): 100 mg via ORAL
  Administered 2014-10-31 – 2014-11-01 (×2): 50 mg via ORAL
  Administered 2014-11-01 – 2014-11-03 (×3): 100 mg via ORAL
  Filled 2014-10-26 (×8): qty 2
  Filled 2014-10-26 (×2): qty 1
  Filled 2014-10-26: qty 2
  Filled 2014-10-26: qty 1

## 2014-10-26 MED ORDER — PANTOPRAZOLE SODIUM 40 MG PO TBEC
40.0000 mg | DELAYED_RELEASE_TABLET | Freq: Every day | ORAL | Status: DC
  Administered 2014-10-27 – 2014-11-04 (×9): 40 mg via ORAL
  Filled 2014-10-26 (×9): qty 1

## 2014-10-26 MED ORDER — IOHEXOL 300 MG/ML  SOLN: 25.0000 mL | Freq: Once | INTRAMUSCULAR | Status: AC | PRN

## 2014-10-26 MED ORDER — TIMOLOL MALEATE 0.5 % OP SOLN
1.0000 [drp] | Freq: Two times a day (BID) | OPHTHALMIC | Status: DC
  Administered 2014-10-28 – 2014-10-29 (×3): 1 [drp] via OPHTHALMIC
  Filled 2014-10-26: qty 5

## 2014-10-26 MED ORDER — HEPARIN SODIUM (PORCINE) 5000 UNIT/ML IJ SOLN
5000.0000 [IU] | Freq: Three times a day (TID) | INTRAMUSCULAR | Status: DC
  Filled 2014-10-26 (×27): qty 1

## 2014-10-26 MED ORDER — ACETAMINOPHEN 650 MG RE SUPP: 650.0000 mg | Freq: Four times a day (QID) | RECTAL | Status: DC | PRN

## 2014-10-26 MED ORDER — IOHEXOL 300 MG/ML  SOLN
100.0000 mL | Freq: Once | INTRAMUSCULAR | Status: AC | PRN
  Administered 2014-10-26: 100 mL via INTRAVENOUS

## 2014-10-26 MED ORDER — DIPHENHYDRAMINE HCL 12.5 MG/5ML PO ELIX: 12.5000 mg | ORAL_SOLUTION | Freq: Four times a day (QID) | ORAL | Status: DC | PRN

## 2014-10-26 MED ORDER — ALUM & MAG HYDROXIDE-SIMETH 200-200-20 MG/5ML PO SUSP
30.0000 mL | Freq: Four times a day (QID) | ORAL | Status: DC | PRN
  Administered 2014-10-30 – 2014-10-31 (×2): 30 mL via ORAL
  Filled 2014-10-26 (×2): qty 30

## 2014-10-26 MED ORDER — BRIMONIDINE TARTRATE-TIMOLOL 0.2-0.5 % OP SOLN
1.0000 [drp] | Freq: Two times a day (BID) | OPHTHALMIC | Status: DC
  Filled 2014-10-26: qty 5

## 2014-10-26 MED ORDER — AMITRIPTYLINE HCL 25 MG PO TABS
25.0000 mg | ORAL_TABLET | Freq: Every day | ORAL | Status: DC
  Administered 2014-10-27 – 2014-11-03 (×8): 25 mg via ORAL
  Filled 2014-10-26 (×10): qty 1

## 2014-10-26 MED ORDER — HYDROMORPHONE HCL 1 MG/ML IJ SOLN: 0.5000 mg | INTRAMUSCULAR | Status: DC

## 2014-10-26 MED ORDER — PHENOL 1.4 % MT LIQD
2.0000 | OROMUCOSAL | Status: DC | PRN
  Filled 2014-10-26: qty 177

## 2014-10-26 MED ORDER — MAGIC MOUTHWASH
15.0000 mL | Freq: Four times a day (QID) | ORAL | Status: DC | PRN
  Filled 2014-10-26: qty 15

## 2014-10-26 MED ORDER — BRIMONIDINE TARTRATE 0.2 % OP SOLN
1.0000 [drp] | Freq: Two times a day (BID) | OPHTHALMIC | Status: DC
  Administered 2014-10-28 – 2014-11-03 (×3): 1 [drp] via OPHTHALMIC
  Filled 2014-10-26: qty 5

## 2014-10-26 MED ORDER — ONDANSETRON HCL 4 MG/2ML IJ SOLN
4.0000 mg | Freq: Four times a day (QID) | INTRAMUSCULAR | Status: DC | PRN
  Administered 2014-10-26 – 2014-10-30 (×6): 4 mg via INTRAVENOUS
  Filled 2014-10-26 (×6): qty 2

## 2014-10-26 NOTE — ED Provider Notes (Signed)
CSN: [id number]     Arrival date & time 10/26/14  1443 History  This chart was scribed for Dot Lanes, MD by Jeanell Sparrow, ED Scribe. This patient was seen in room MH01/MH01 and the patient's care was started at 3:23 PM.   Chief Complaint  Patient presents with  . Chest Pain   The history is provided by the patient. No language interpreter was used.    HPI Comments: Terri Shah is a 80 y.o. female with a hx of GERD who presents to the Emergency Department complaining of constant moderate chest pain that started about 4 hours ago. She reports that the pain started after having breakfast this morning. She states that her pain is in her esophagus and may be related to her GERD. She reports that she took some tums and zantac without any relief. She states that she had a few episodes of associated emesis today with nausea. She reports that she has been taking her GERD medication and having normal BMs. She denies any diarrhea.   Past Medical History  Diagnosis Date  . Hypertension   . Anemia   . Hyperlipidemia   . Arthritis   . Diverticulosis   . Gastritis   . Hiatal hernia   . Barrett esophagus   . Cataract   . History of GI diverticular bleed   . Presbyesophagus   . Iron deficiency anemia    Past Surgical History  Procedure Laterality Date  . Cesarean section      x 3  . Tubal ligation    . Facial cosmetic surgery    . Cataract extraction, bilateral     Family History  Problem Relation Age of Onset  . Heart disease Mother   . Heart disease Father   . Breast cancer Sister   . Colon cancer Neg Hx   . Diabetes Sister   . Kidney disease Sister   . Stroke Mother    History  Substance Use Topics  . Smoking status: Never Smoker   . Smokeless tobacco: Never Used  . Alcohol Use: Yes     Comment: occasional   OB History    No data available     Review of Systems  Cardiovascular: Positive for chest pain.  Gastrointestinal: Positive for nausea and vomiting. Negative  for diarrhea.  All other systems reviewed and are negative.    Allergies  Codeine  Home Medications   Prior to Admission medications   Medication Sig Start Date End Date Taking? Authorizing Provider  acetaminophen (TYLENOL) 500 MG tablet Take 500 mg by mouth every 6 (six) hours as needed. Patient used this medication for her pain.    Historical Provider, MD  amitriptyline (ELAVIL) 25 MG tablet Take 25 mg by mouth at bedtime.     Historical Provider, MD  atorvastatin (LIPITOR) 20 MG tablet Take 20 mg by mouth daily.      Historical Provider, MD  benazepril (LOTENSIN) 20 MG tablet Take 20 mg by mouth daily.      Historical Provider, MD  brimonidine-timolol (COMBIGAN) 0.2-0.5 % ophthalmic solution Place 1 drop into the right eye every 12 (twelve) hours.    Historical Provider, MD  cholecalciferol (VITAMIN D) 1000 UNITS tablet Take 1,000 Units by mouth daily.      Historical Provider, MD  denosumab (PROLIA) 60 MG/ML SOLN injection Inject 60 mg into the skin every 6 (six) months. Administer in upper arm, thigh, or abdomen    Historical Provider, MD  hydrochlorothiazide (,MICROZIDE/HYDRODIURIL,)  12.5 MG capsule Take 12.5 mg by mouth daily. Take 1 tab every other day.    Historical Provider, MD  omeprazole (PRILOSEC) 20 MG capsule Take 20 mg by mouth daily. 1 cap before breakfast.     Historical Provider, MD  Psyllium (METAMUCIL PO) Take 1 each by mouth at bedtime.    Historical Provider, MD  traMADol (ULTRAM) 50 MG tablet Take 50 mg by mouth every 6 (six) hours as needed.      Historical Provider, MD   BP 161/78 mmHg  Pulse 65  Temp(Src) 98.2 F (36.8 C) (Oral)  Resp 18  Ht 5\' 1"  (1.549 m)  Wt 126 lb (57.153 kg)  BMI 23.82 kg/m2  SpO2 100% Physical Exam Physical Exam  Nursing note and vitals reviewed. Constitutional: She is oriented to person, place, and time. She appears well-developed and well-nourished. No distress.  HENT:  Head: Normocephalic and atraumatic.  Eyes: Pupils are  equal, round, and reactive to light.  Neck: Normal range of motion.  Cardiovascular: Normal rate and intact distal pulses.   Pulmonary/Chest: No respiratory distress.  Abdominal: Normal appearance. She exhibits no distension. tenderness to palpation right upper quadrant.  No rebound or guarding tenderness.  Active bowel sounds.  No obvious distention. Musculoskeletal: Normal range of motion.  Neurological: She is alert and oriented to person, place, and time. No cranial nerve deficit.  Skin: Skin is warm and dry. No rash noted.  Psychiatric: She has a normal mood and affect. Her behavior is normal.   ED Course  Procedures (including critical care time) Medications  iohexol (OMNIPAQUE) 300 MG/ML solution 25 mL (not administered)  ondansetron (ZOFRAN) injection 4 mg (not administered)  fentaNYL (SUBLIMAZE) injection 50 mcg (not administered)  ondansetron (ZOFRAN) injection 4 mg (4 mg Intravenous Given 10/26/14 1600)  sodium chloride 0.9 % bolus 1,000 mL (0 mLs Intravenous Stopped 10/26/14 1713)  fentaNYL (SUBLIMAZE) injection 50 mcg (50 mcg Intravenous Given 10/26/14 1600)  fentaNYL (SUBLIMAZE) injection 50 mcg (50 mcg Intravenous Given 10/26/14 1648)  ondansetron (ZOFRAN) injection 4 mg (4 mg Intravenous Given 10/26/14 1648)  iohexol (OMNIPAQUE) 300 MG/ML solution 100 mL (100 mLs Intravenous Contrast Given 10/26/14 1753)    DIAGNOSTIC STUDIES: Oxygen Saturation is 100% on RA, normal by my interpretation.    COORDINATION OF CARE: 3:27 PM- Pt advised of plan for treatment which includes radiology and pt agrees.  Labs Review Labs Reviewed  CBC - Abnormal; Notable for the following:    WBC 12.4 (*)    All other components within normal limits  COMPREHENSIVE METABOLIC PANEL - Abnormal; Notable for the following:    Sodium 131 (*)    Chloride 90 (*)    Glucose, Bld 139 (*)    Anion gap 18 (*)    All other components within normal limits  URINALYSIS, ROUTINE W REFLEX MICROSCOPIC -  Abnormal; Notable for the following:    Ketones, ur 15 (*)    All other components within normal limits  URINE CULTURE  TROPONIN I  LIPASE, BLOOD    Imaging Review Dg Chest 2 View  10/26/2014   CLINICAL DATA:  Chest and abdomen pain for 5 hr. Some nausea. No injury.  EXAM: CHEST  2 VIEW  COMPARISON:  10/19/2009.  FINDINGS: Cardiac silhouette is normal in size. No mediastinal or hilar masses. Lungs are mildly hyperexpanded with minor scarring at the apices. No lung consolidation or edema. No pleural effusion or pneumothorax.  Bony thorax is demineralized but intact.  IMPRESSION: No acute  cardiopulmonary disease.   Electronically Signed   By: Lajean Manes M.D.   On: 10/26/2014 16:09   Ct Abdomen Pelvis W Contrast  10/26/2014   CLINICAL DATA:  Nausea/ vomiting, abdominal pain, chest pain  EXAM: CT ABDOMEN AND PELVIS WITH CONTRAST  TECHNIQUE: Multidetector CT imaging of the abdomen and pelvis was performed using the standard protocol following bolus administration of intravenous contrast.  CONTRAST:  100 mL Omnipaque 300 IV  COMPARISON:  None.  FINDINGS: Lower chest:  Mild scarring/ atelectasis of the lung bases.  Contrast in the distal esophagus, suggesting gastroesophageal reflux or esophageal dysmotility.  Hepatobiliary: Liver is notable for a 2.7 cm cyst in the medial segment left hepatic lobe (series 2/ image 22) with and an additional 3.6 cm cyst in the caudate (series 2/image 16.  Gallbladder is notable for a 2.7 cm stone in the gallbladder neck with mild gallbladder wall thickening/edema (series 2/image 30), raising concern for acute cholecystitis.  Dilated common duct, measuring 10 mm (series 5/ image 40). Distal CBD abruptly tapers at the ampulla, without definite common duct calculus visualized on CT.  Pancreas: Within normal limits.  Spleen: Within normal limits.  Adrenals/Urinary Tract: Adrenal glands are unremarkable.  Kidneys are within normal limits.  No hydronephrosis.  Bladder is  unremarkable.  Stomach/Bowel: Stomach is unremarkable.  No evidence of bowel obstruction.  Normal appendix.  Extensive colonic diverticulosis, without associated inflammatory changes.  Vascular/Lymphatic: Atherosclerotic calcifications of the abdominal aorta and branch vessels.  No suspicious abdominopelvic lymphadenopathy.  Reproductive: Uterus is unremarkable.  Bilateral ovaries are grossly unremarkable.  Other: No abdominopelvic ascites.  Small fat containing right inguinal hernia.  Musculoskeletal: Degenerative changes of the visualized thoracolumbar spine with mild lumbar levoscoliosis.  IMPRESSION: Cholelithiasis with gallbladder wall thickening/ edema, concerning for acute cholecystitis.  Common duct is dilated, measuring 10 mm, and abruptly tapers at the ampulla. No definite common duct calculus is evident on CT.   Electronically Signed   By: Julian Hy M.D.   On: 10/26/2014 18:23   Dg Abd Acute W/chest  10/26/2014   CLINICAL DATA:  Abdominal pain, nausea  EXAM: ACUTE ABDOMEN SERIES (ABDOMEN 2 VIEW & CHEST 1 VIEW)  COMPARISON:  None.  FINDINGS: Several mildly dilated loops of probable small bowel noted over the right upper quadrant, largest caliber 3.5 cm. Colon is normal in caliber. No free air beneath the diaphragm allowing for bowel interposition over the liver. No abnormal radiopacity.  IMPRESSION: Several mildly dilated loops of small bowel over the right upper quadrant which could indicate early small bowel obstruction or focal ileus.   Electronically Signed   By: Conchita Paris M.D.   On: 10/26/2014 16:11     EKG Interpretation   Date/Time:  Saturday October 26 2014 14:47:08 EST Ventricular Rate:  66 PR Interval:  152 QRS Duration: 134 QT Interval:  434 QTC Calculation: 144 R Axis:   57 Text Interpretation:  Normal sinus rhythm Right bundle branch block  Abnormal ECG No significant change since last tracing Confirmed by Lauren Aguayo   MD, Celso Granja ([id number]) on 10/26/2014 3:21:04 PM      Discussed the case with general surgery(Dr. Johney Maine) who requested patient be sent to emergency room at [facility] where she will be evaluated.  Patient agreeable with this.  Prefers to go by private vehicle. MDM   Final diagnoses:  Chest pain  Nausea and vomiting  Acute cholecystitis    I personally performed the services described in this documentation, which was scribed in  my presence. The recorded information has been reviewed and considered.    Dot Lanes, MD 10/26/14 [phone]

## 2014-10-26 NOTE — H&P (Signed)
[address] [place], [place] [address] Phone: [phone] FAX: [place]  06/23/1934 [id number]  CARE TEAM:  PCP: Precious Reel, MD  Outpatient Care Team: Patient Care Team: Precious Reel, MD as PCP - General (Internal Medicine) Lafayette Dragon, MD as Consulting Physician (Gastroenterology)  Inpatient Treatment Team: Treatment Team: Attending Provider: Nolon Nations, MD; Technician: Edd Fabian, NT; Registered Nurse: Dahlia Bailiff, RN; Consulting Physician: Nolon Nations, MD  This patient is a 80 y.o.female who presents today for surgical evaluation at the request of Dr Tereso Newcomer.   Reason for evaluation: Cholecystitis  Pleasant active woman.  Comes in today with son and granddaughter.  History of reflux esophagitis and Barrett's esophagus.  Followed by Dr. Olevia Perches with gastroenterology.  Controlled on proton pump inhibitors.  Last endoscopy a year ago showed no intestinal metaplasia.  History of diverticulosis.  History of diverticular bleeds.  Intermittent every few years.  Difficult to localize.    Patient noted sharp chest and upper abdominal epigastric type pain.  Pain more vague but seems worst in right side.  Went to emergency room.  Required IV narcotics.  Happened a few hours after eating breakfast.  Recalls a couple smaller attacks in the past.  Not as severe as this.  Usually went away.  This day in time the attack persisted and became intense.  Therefore went to emergency room this afternoon.  [facility] [place].  This does not feel like heartburn or reflux attacks.    Based on concerns, CT scan done.  Showed large gallstone with some inflammation.  Emergency room physician concerned about cholecystitis.  Surgical consultation requested.  Patient transferred from [facility] ER to [facility].  No exertional chest/neck/shoulder/arm pain.  Patient can walk 60 minutes for about 1.5 miles without  difficulty.   No personal nor family history of GI/colon cancer, inflammatory bowel disease, irritable bowel syndrome, allergy such as Celiac Sprue, dietary/dairy problems, colitis, ulcers nor gastritis.  No recent sick contacts/gastroenteritis.  No travel outside the country.  No changes in diet.  No dysphagia to solids or liquids.  No significant heartburn or reflux.  No hematochezia, hematemesis, coffee ground emesis.  No evidence of prior gastric/peptic ulceration.   Past Medical History  Diagnosis Date  . Hypertension   . Anemia   . Hyperlipidemia   . Arthritis   . Diverticulosis   . Gastritis   . Hiatal hernia   . Barrett esophagus   . Cataract   . History of GI diverticular bleed   . Presbyesophagus   . Iron deficiency anemia     Past Surgical History  Procedure Laterality Date  . Cesarean section      x 3  . Tubal ligation    . Facial cosmetic surgery    . Cataract extraction, bilateral      History   Social History  . Marital Status: Widowed    Spouse Name: N/A    Number of Children: 3  . Years of Education: N/A   Occupational History  . Not on file.   Social History Main Topics  . Smoking status: Never Smoker   . Smokeless tobacco: Never Used  . Alcohol Use: Yes     Comment: occasional  . Drug Use: No  . Sexual Activity: Not on file   Other Topics Concern  . Not on file   Social History Narrative  Family History  Problem Relation Age of Onset  . Heart disease Mother   . Heart disease Father   . Breast cancer Sister   . Colon cancer Neg Hx   . Diabetes Sister   . Kidney disease Sister   . Stroke Mother     Current Facility-Administered Medications  Medication Dose Route Frequency Provider Last Rate Last Dose  . acetaminophen (TYLENOL) tablet 650 mg  650 mg Oral Q6H PRN Michael Boston, MD       Or  . acetaminophen (TYLENOL) suppository 650 mg  650 mg Rectal Q6H PRN Michael Boston, MD      . alum & mag hydroxide-simeth (MAALOX/MYLANTA)  200-200-20 MG/5ML suspension 30 mL  30 mL Oral Q6H PRN Michael Boston, MD      . amitriptyline (ELAVIL) tablet 25 mg  25 mg Oral QHS Michael Boston, MD      . bisacodyl (DULCOLAX) suppository 10 mg  10 mg Rectal Q12H PRN Michael Boston, MD      . brimonidine-timolol (COMBIGAN) 0.2-0.5 % ophthalmic solution 1 drop  1 drop Right Eye Q12H Michael Boston, MD      . chlorhexidine (HIBICLENS) 4 % liquid 1 application  1 application Topical Once Michael Boston, MD      . Derrill Memo ON 10/27/2014] chlorhexidine (HIBICLENS) 4 % liquid 1 application  1 application Topical Once Michael Boston, MD      . dextrose 5 % and 0.45 % NaCl with KCl 40 mEq/L infusion   Intravenous Continuous Michael Boston, MD      . diphenhydrAMINE (BENADRYL) injection 12.5-25 mg  12.5-25 mg Intravenous Q6H PRN Michael Boston, MD       Or  . diphenhydrAMINE (BENADRYL) 12.5 MG/5ML elixir 12.5-25 mg  12.5-25 mg Oral Q6H PRN Michael Boston, MD      . Derrill Memo ON 10/27/2014] heparin injection 5,000 Units  5,000 Units Subcutaneous 3 times per day Michael Boston, MD      . HYDROmorphone (DILAUDID) injection 0.5-2 mg  0.5-2 mg Intravenous Q2H PRN Michael Boston, MD      . iohexol (OMNIPAQUE) 300 MG/ML solution 25 mL  25 mL Oral Once PRN Medication Radiologist, MD      . lactated ringers bolus 1,000 mL  1,000 mL Intravenous Once Michael Boston, MD      . lactated ringers bolus 1,000 mL  1,000 mL Intravenous Q8H PRN Michael Boston, MD      . lip balm (CARMEX) ointment 1 application  1 application Topical BID Michael Boston, MD      . magic mouthwash  15 mL Oral QID PRN Michael Boston, MD      . menthol-cetylpyridinium (CEPACOL) lozenge 3 mg  1 lozenge Oral PRN Michael Boston, MD      . metoprolol (LOPRESSOR) injection 5 mg  5 mg Intravenous Q6H PRN Michael Boston, MD      . ondansetron [facility]) injection 4 mg  4 mg Intravenous Q6H PRN Michael Boston, MD      . ondansetron [facility]) tablet 4 mg  4 mg Oral Q6H PRN Michael Boston, MD      . pantoprazole (PROTONIX) EC tablet 40 mg  40 mg  Oral Daily Michael Boston, MD      . phenol (CHLORASEPTIC) mouth spray 2 spray  2 spray Mouth/Throat PRN Michael Boston, MD      . polyethylene glycol (MIRALAX / GLYCOLAX) packet 17 g  17 g Oral BID Michael Boston, MD      . polyethylene glycol [facility] /  GLYCOLAX) packet 17 g  17 g Oral Q12H PRN Michael Boston, MD      . promethazine (PHENERGAN) injection 6.25-12.5 mg  6.25-12.5 mg Intravenous Q4H PRN Michael Boston, MD      . saccharomyces boulardii (FLORASTOR) capsule 250 mg  250 mg Oral BID Michael Boston, MD      . traMADol Veatrice Bourbon) tablet 50-100 mg  50-100 mg Oral Q6H PRN Michael Boston, MD      . zolpidem [facility]) tablet 5-10 mg  5-10 mg Oral QHS PRN Michael Boston, MD       Current Outpatient Prescriptions  Medication Sig Dispense Refill  . acetaminophen (TYLENOL) 500 MG tablet Take 500 mg by mouth every 6 (six) hours as needed. Patient used this medication for her pain.    Marland Kitchen amitriptyline (ELAVIL) 25 MG tablet Take 25 mg by mouth at bedtime.     Marland Kitchen atorvastatin (LIPITOR) 20 MG tablet Take 20 mg by mouth daily.      . benazepril (LOTENSIN) 20 MG tablet Take 20 mg by mouth daily.      . brimonidine-timolol (COMBIGAN) 0.2-0.5 % ophthalmic solution Place 1 drop into the right eye every 12 (twelve) hours.    . cholecalciferol (VITAMIN D) 1000 UNITS tablet Take 1,000 Units by mouth daily.      Marland Kitchen denosumab (PROLIA) 60 MG/ML SOLN injection Inject 60 mg into the skin every 6 (six) months. Administer in upper arm, thigh, or abdomen    . hydrochlorothiazide (,MICROZIDE/HYDRODIURIL,) 12.5 MG capsule Take 12.5 mg by mouth daily. Take 1 tab every other day.    Marland Kitchen omeprazole (PRILOSEC) 20 MG capsule Take 20 mg by mouth daily. 1 cap before breakfast.     . Psyllium (METAMUCIL PO) Take 1 each by mouth at bedtime.    . traMADol (ULTRAM) 50 MG tablet Take 50 mg by mouth every 6 (six) hours as needed.         Allergies  Allergen Reactions  . Codeine Other (See Comments)    "Perception off"     ROS: Constitutional:   No fevers, chills, sweats.  Weight stable Eyes:  No vision changes, No discharge HENT:  No sore throats, nasal drainage Lymph: No neck swelling, No bruising easily Pulmonary:  No cough, productive sputum CV: No orthopnea, PND  Patient walks 60 minutes for about 1.5 miles without difficulty.  No exertional chest/neck/shoulder/arm pain. GI:  No personal nor family history of GI/colon cancer, inflammatory bowel disease, irritable bowel syndrome, allergy such as Celiac Sprue, dietary/dairy problems, colitis, ulcers nor gastritis.  No recent sick contacts/gastroenteritis.  No travel outside the country.  No changes in diet. Renal: No UTIs, No hematuria Genital:  No drainage, bleeding, masses Musculoskeletal: No severe joint pain.  Good ROM major joints Skin:  No sores or lesions.  No rashes Heme/Lymph:  No easy bleeding.  No swollen lymph nodes Neuro: No focal weakness/numbness.  No seizures Psych: No suicidal ideation.  No hallucinations  BP 175/87 mmHg  Pulse 82  Temp(Src) 98 F (36.7 C) (Oral)  Resp 16  Ht _0  (1.549 m)  Wt 126 lb (57.153 kg)  BMI 23.82 kg/m2  SpO2 94%  Physical Exam: General: Pt awake/alert/oriented x4 in no major acute distress.  Tired but not toxic Eyes: PERRL, normal EOM. Sclera nonicteric Neuro: CN II-XII intact w/o focal sensory/motor deficits. Lymph: No head/neck/groin lymphadenopathy Psych:  No delerium/psychosis/paranoia HENT: Normocephalic, Mucus membranes moist.  No thrush Neck: Supple, No tracheal deviation Chest: No pain.  Good respiratory  excursion. CV:  Pulses intact.  Regular rhythm Abdomen: Soft, Nondistended.  Mild abdominal tenderness periumbilical and epigastric.  Most tender in right upper quadrant.  Guarding to deep palpation but weak Murphy sign.  No rebound tenderness.  No pain over McBurney's point.  Low midline incision without hernia.  No umbilical hernias.  Genital normal external female genitalia.  No inguinal hernias. Ext:  SCDs BLE.   No significant edema.  No cyanosis Skin: No petechiae / purpurea.  No major sores Musculoskeletal: No severe joint pain.  Good ROM major joints   Results:   Labs: Results for orders placed or performed during the hospital encounter of 10/26/14 (from the past 48 hour(s))  CBC     Status: Abnormal   Collection Time: 10/26/14  3:40 PM  Result Value Ref Range   WBC 12.4 (H) 4.0 - 10.5 K/uL   RBC 4.21 3.87 - 5.11 MIL/uL   Hemoglobin 12.8 12.0 - 15.0 g/dL   HCT 37.2 36.0 - 46.0 %   MCV 88.4 78.0 - 100.0 fL   MCH 30.4 26.0 - 34.0 pg   MCHC 34.4 30.0 - 36.0 g/dL   RDW 12.0 11.5 - 15.5 %   Platelets 308 150 - 400 K/uL  Troponin I (MHP)     Status: None   Collection Time: 10/26/14  3:40 PM  Result Value Ref Range   Troponin I <0.30 <0.30 ng/mL    Comment:        Due to the release kinetics of cTnI, a negative result within the first hours of the onset of symptoms does not rule out myocardial infarction with certainty. If myocardial infarction is still suspected, repeat the test at appropriate intervals.   Comprehensive metabolic panel     Status: Abnormal   Collection Time: 10/26/14  3:40 PM  Result Value Ref Range   Sodium 131 (L) 137 - 147 mEq/L   Potassium 3.7 3.7 - 5.3 mEq/L   Chloride 90 (L) 96 - 112 mEq/L   CO2 23 19 - 32 mEq/L   Glucose, Bld 139 (H) 70 - 99 mg/dL   BUN 9 6 - 23 mg/dL   Creatinine, Ser 0.50 0.50 - 1.10 mg/dL   Calcium 9.8 8.4 - 10.5 mg/dL   Total Protein 7.4 6.0 - 8.3 g/dL   Albumin 4.0 3.5 - 5.2 g/dL   AST 17 0 - 37 U/L   ALT 9 0 - 35 U/L   Alkaline Phosphatase 62 39 - 117 U/L   Total Bilirubin 0.6 0.3 - 1.2 mg/dL   GFR calc non Af Amer >90 >90 mL/min   GFR calc Af Amer >90 >90 mL/min    Comment: (NOTE) The eGFR has been calculated using the CKD EPI equation. This calculation has not been validated in all clinical situations. eGFR's persistently <90 mL/min signify possible Chronic Kidney Disease.    Anion gap 18 (H) 5 - 15  Lipase, blood      Status: None   Collection Time: 10/26/14  3:40 PM  Result Value Ref Range   Lipase 29 11 - 59 U/L  Urinalysis, Routine w reflex microscopic     Status: Abnormal   Collection Time: 10/26/14  4:52 PM  Result Value Ref Range   Color, Urine YELLOW YELLOW   APPearance CLEAR CLEAR   Specific Gravity, Urine 1.010 1.005 - 1.030   pH 8.0 5.0 - 8.0   Glucose, UA NEGATIVE NEGATIVE mg/dL   Hgb urine dipstick NEGATIVE NEGATIVE   Bilirubin Urine  NEGATIVE NEGATIVE   Ketones, ur 15 (A) NEGATIVE mg/dL   Protein, ur NEGATIVE NEGATIVE mg/dL   Urobilinogen, UA 1.0 0.0 - 1.0 mg/dL   Nitrite NEGATIVE NEGATIVE   Leukocytes, UA NEGATIVE NEGATIVE    Comment: MICROSCOPIC NOT DONE ON URINES WITH NEGATIVE PROTEIN, BLOOD, LEUKOCYTES, NITRITE, OR GLUCOSE <1000 mg/dL.    Imaging / Studies: Dg Chest 2 View  10/26/2014   CLINICAL DATA:  Chest and abdomen pain for 5 hr. Some nausea. No injury.  EXAM: CHEST  2 VIEW  COMPARISON:  10/19/2009.  FINDINGS: Cardiac silhouette is normal in size. No mediastinal or hilar masses. Lungs are mildly hyperexpanded with minor scarring at the apices. No lung consolidation or edema. No pleural effusion or pneumothorax.  Bony thorax is demineralized but intact.  IMPRESSION: No acute cardiopulmonary disease.   Electronically Signed   By: Lajean Manes M.D.   On: 10/26/2014 16:09   Ct Abdomen Pelvis W Contrast  10/26/2014   CLINICAL DATA:  Nausea/ vomiting, abdominal pain, chest pain  EXAM: CT ABDOMEN AND PELVIS WITH CONTRAST  TECHNIQUE: Multidetector CT imaging of the abdomen and pelvis was performed using the standard protocol following bolus administration of intravenous contrast.  CONTRAST:  100 mL Omnipaque 300 IV  COMPARISON:  None.  FINDINGS: Lower chest:  Mild scarring/ atelectasis of the lung bases.  Contrast in the distal esophagus, suggesting gastroesophageal reflux or esophageal dysmotility.  Hepatobiliary: Liver is notable for a 2.7 cm cyst in the medial segment left hepatic lobe  (series 2/ image 22) with and an additional 3.6 cm cyst in the caudate (series 2/image 16.  Gallbladder is notable for a 2.7 cm stone in the gallbladder neck with mild gallbladder wall thickening/edema (series 2/image 30), raising concern for acute cholecystitis.  Dilated common duct, measuring 10 mm (series 5/ image 40). Distal CBD abruptly tapers at the ampulla, without definite common duct calculus visualized on CT.  Pancreas: Within normal limits.  Spleen: Within normal limits.  Adrenals/Urinary Tract: Adrenal glands are unremarkable.  Kidneys are within normal limits.  No hydronephrosis.  Bladder is unremarkable.  Stomach/Bowel: Stomach is unremarkable.  No evidence of bowel obstruction.  Normal appendix.  Extensive colonic diverticulosis, without associated inflammatory changes.  Vascular/Lymphatic: Atherosclerotic calcifications of the abdominal aorta and branch vessels.  No suspicious abdominopelvic lymphadenopathy.  Reproductive: Uterus is unremarkable.  Bilateral ovaries are grossly unremarkable.  Other: No abdominopelvic ascites.  Small fat containing right inguinal hernia.  Musculoskeletal: Degenerative changes of the visualized thoracolumbar spine with mild lumbar levoscoliosis.  IMPRESSION: Cholelithiasis with gallbladder wall thickening/ edema, concerning for acute cholecystitis.  Common duct is dilated, measuring 10 mm, and abruptly tapers at the ampulla. No definite common duct calculus is evident on CT.   Electronically Signed   By: Julian Hy M.D.   On: 10/26/2014 18:23   Dg Abd Acute W/chest  10/26/2014   CLINICAL DATA:  Abdominal pain, nausea  EXAM: ACUTE ABDOMEN SERIES (ABDOMEN 2 VIEW & CHEST 1 VIEW)  COMPARISON:  None.  FINDINGS: Several mildly dilated loops of probable small bowel noted over the right upper quadrant, largest caliber 3.5 cm. Colon is normal in caliber. No free air beneath the diaphragm allowing for bowel interposition over the liver. No abnormal radiopacity.   IMPRESSION: Several mildly dilated loops of small bowel over the right upper quadrant which could indicate early small bowel obstruction or focal ileus.   Electronically Signed   By: Conchita Paris M.D.   On: 10/26/2014 16:11  Medications / Allergies: per chart  Antibiotics: Anti-infectives    None      Assessment  NAMRATA DANGLER  80 y.o. female       Problem List:  Principal Problem:   Acute calculous cholecystitis Active Problems:   GERD   Chest pain   Barrett esophagus   History of GI diverticular bleed   Hypertension   Episode of postprandial pain with gallstones and gallbladder wall thickening and pain worst and right upper quadrant.  Suspicious for acute on chronic calculus cholecystitis.  Rest of differential diagnosis unremarkable.  Plan:  Admit.  IV fluids.  Nausea and pain control.  IV antibiotics.  Laparoscopic cholecystectomy and cholangiogram this admission.  Discussed with patient, her son, and her granddaughter:  The anatomy & physiology of hepatobiliary & pancreatic function was discussed.  The pathophysiology of gallbladder dysfunction was discussed.  Natural history risks without surgery was discussed.   I feel the risks of no intervention will lead to serious problems that outweigh the operative risks; therefore, I recommended cholecystectomy to remove the pathology.  I explained laparoscopic techniques with possible need for an open approach.  Probable cholangiogram to evaluate the bilary tract was explained as well.    Risks such as bleeding, infection, abscess, leak, injury to other organs, need for further treatment, stroke, heart attack, death, and other risks were discussed.  I noted a good likelihood this will help address the problem.  Possibility that this will not correct all abdominal symptoms was explained.  Goals of post-operative recovery were discussed as well.  We will work to minimize complications.  An educational handout further  explaining the pathology and treatment options was given as well.  Questions were answered.  The patient expresses understanding & wishes to proceed with surgery.  -HTN control -control GERD -VTE prophylaxis- SCDs, etc -mobilize as tolerated to help recovery    Adin Hector, M.D., F.A.C.S. Gastrointestinal and Minimally Invasive Surgery [facility] [address] [place], [place] [address] [phone] Main / Paging   10/26/2014  Note: Portions of this report may have been transcribed using voice recognition software. Every effort was made to ensure accuracy; however, inadvertent computerized transcription errors may be present.   Any transcriptional errors that result from this process are unintentional.

## 2014-10-26 NOTE — ED Notes (Signed)
Patient transported to X-ray 

## 2014-10-26 NOTE — ED Notes (Signed)
Bed: WA21 Expected date:  Expected time:  Means of arrival:  Comments: PT from [place] - coming by private vehicle

## 2014-10-26 NOTE — ED Notes (Signed)
Patient c/o central chest pain that started around 1100, took zantac, and two tums since she has a Hx of acid reflux. Vomited afterwards, nauseous

## 2014-10-27 ENCOUNTER — Observation Stay (HOSPITAL_COMMUNITY): Payer: Medicare PPO | Admitting: Registered Nurse

## 2014-10-27 ENCOUNTER — Encounter (HOSPITAL_COMMUNITY): Admission: EM | Disposition: A | Discharge status: Home / Self Care | Payer: Self-pay | Source: Home / Self Care

## 2014-10-27 ENCOUNTER — Observation Stay (HOSPITAL_COMMUNITY): Payer: Medicare PPO

## 2014-10-27 HISTORY — PX: LAPAROSCOPIC CHOLECYSTECTOMY SINGLE SITE WITH INTRAOPERATIVE CHOLANGIOGRAM: SHX6538

## 2014-10-27 LAB — HEPATIC FUNCTION PANEL
ALT: 11 U/L (ref 0–35)
AST: 20 U/L (ref 0–37)
Albumin: 3.5 g/dL (ref 3.5–5.2)
Alkaline Phosphatase: 57 U/L (ref 39–117)
Bilirubin, Direct: <0.2 mg/dL (ref 0.0–0.3)
Total Bilirubin: 0.6 mg/dL (ref 0.3–1.2)
Total Protein: 7 g/dL (ref 6.0–8.3)

## 2014-10-27 LAB — SURGICAL PCR SCREEN
MRSA, PCR: NEGATIVE
Staphylococcus aureus: NEGATIVE

## 2014-10-27 SURGERY — LAPAROSCOPIC CHOLECYSTECTOMY SINGLE SITE WITH INTRAOPERATIVE CHOLANGIOGRAM
Anesthesia: General | Site: Abdomen

## 2014-10-27 MED ORDER — GLYCOPYRROLATE 0.2 MG/ML IJ SOLN
INTRAMUSCULAR | Status: AC
  Filled 2014-10-27: qty 2

## 2014-10-27 MED ORDER — SODIUM CHLORIDE 0.9 % IJ SOLN: 3.0000 mL | Freq: Two times a day (BID) | INTRAMUSCULAR | Status: DC

## 2014-10-27 MED ORDER — BUPIVACAINE-EPINEPHRINE 0.25% -1:200000 IJ SOLN
INTRAMUSCULAR | Status: AC
  Filled 2014-10-27: qty 1

## 2014-10-27 MED ORDER — PHENYLEPHRINE HCL 10 MG/ML IJ SOLN
INTRAMUSCULAR | Status: DC | PRN
  Administered 2014-10-27 (×3): 120 ug via INTRAVENOUS
  Administered 2014-10-27: 40 ug via INTRAVENOUS

## 2014-10-27 MED ORDER — BUPIVACAINE-EPINEPHRINE 0.25% -1:200000 IJ SOLN
INTRAMUSCULAR | Status: DC | PRN
  Administered 2014-10-27: 50 mL

## 2014-10-27 MED ORDER — PROPOFOL 10 MG/ML IV BOLUS
INTRAVENOUS | Status: AC
  Filled 2014-10-27: qty 20

## 2014-10-27 MED ORDER — GLYCOPYRROLATE 0.2 MG/ML IJ SOLN
INTRAMUSCULAR | Status: DC | PRN
  Administered 2014-10-27: 0.3 mg via INTRAVENOUS

## 2014-10-27 MED ORDER — LIDOCAINE HCL (CARDIAC) 20 MG/ML IV SOLN
INTRAVENOUS | Status: DC | PRN
  Administered 2014-10-27: 75 mg via INTRAVENOUS

## 2014-10-27 MED ORDER — PROMETHAZINE HCL 25 MG/ML IJ SOLN
INTRAMUSCULAR | Status: AC
  Filled 2014-10-27: qty 1

## 2014-10-27 MED ORDER — HYDROMORPHONE HCL 1 MG/ML IJ SOLN
0.2500 mg | INTRAMUSCULAR | Status: DC | PRN
Start: 2014-10-27 — End: 2014-10-27

## 2014-10-27 MED ORDER — ROCURONIUM BROMIDE 100 MG/10ML IV SOLN
INTRAVENOUS | Status: AC
  Filled 2014-10-27: qty 1

## 2014-10-27 MED ORDER — FENTANYL CITRATE 0.05 MG/ML IJ SOLN
INTRAMUSCULAR | Status: DC | PRN
  Administered 2014-10-27: 25 ug via INTRAVENOUS
  Administered 2014-10-27 (×2): 50 ug via INTRAVENOUS
  Administered 2014-10-27: 25 ug via INTRAVENOUS
  Administered 2014-10-27 (×2): 50 ug via INTRAVENOUS

## 2014-10-27 MED ORDER — LABETALOL HCL 5 MG/ML IV SOLN
INTRAVENOUS | Status: DC | PRN
  Administered 2014-10-27: 2.5 mg via INTRAVENOUS

## 2014-10-27 MED ORDER — MIDAZOLAM HCL 5 MG/5ML IJ SOLN
INTRAMUSCULAR | Status: DC | PRN
  Administered 2014-10-27: 0.5 mg via INTRAVENOUS

## 2014-10-27 MED ORDER — LACTATED RINGERS IV BOLUS (SEPSIS): 1000.0000 mL | Freq: Three times a day (TID) | INTRAVENOUS | Status: AC | PRN

## 2014-10-27 MED ORDER — ACETAMINOPHEN 10 MG/ML IV SOLN
INTRAVENOUS | Status: DC | PRN
  Administered 2014-10-27: 1000 mg via INTRAVENOUS

## 2014-10-27 MED ORDER — ACETAMINOPHEN 10 MG/ML IV SOLN
1000.0000 mg | Freq: Once | INTRAVENOUS | Status: DC
  Filled 2014-10-27: qty 100

## 2014-10-27 MED ORDER — LIDOCAINE HCL (CARDIAC) 20 MG/ML IV SOLN
INTRAVENOUS | Status: AC
  Filled 2014-10-27: qty 5

## 2014-10-27 MED ORDER — SODIUM CHLORIDE 0.9 % IV SOLN: 250.0000 mL | INTRAVENOUS | Status: DC | PRN

## 2014-10-27 MED ORDER — FENTANYL CITRATE 0.05 MG/ML IJ SOLN
INTRAMUSCULAR | Status: AC
  Filled 2014-10-27: qty 5

## 2014-10-27 MED ORDER — SUCCINYLCHOLINE CHLORIDE 20 MG/ML IJ SOLN
INTRAMUSCULAR | Status: DC | PRN
  Administered 2014-10-27: 100 mg via INTRAVENOUS

## 2014-10-27 MED ORDER — LACTATED RINGERS IR SOLN
Status: DC | PRN
  Administered 2014-10-27: 3000 mL
  Administered 2014-10-27: 1

## 2014-10-27 MED ORDER — PHENYLEPHRINE HCL 10 MG/ML IJ SOLN
10.0000 mg | INTRAVENOUS | Status: DC | PRN
  Administered 2014-10-27 (×2): 10 ug/min via INTRAVENOUS

## 2014-10-27 MED ORDER — FENTANYL CITRATE 0.05 MG/ML IJ SOLN
25.0000 ug | INTRAMUSCULAR | Status: DC | PRN
Start: 2014-10-27 — End: 2014-11-04
  Administered 2014-10-30 – 2014-10-31 (×4): 25 ug via INTRAVENOUS
  Filled 2014-10-27 (×4): qty 2

## 2014-10-27 MED ORDER — SODIUM CHLORIDE 0.9 % IJ SOLN: 3.0000 mL | INTRAMUSCULAR | Status: DC | PRN

## 2014-10-27 MED ORDER — NEOSTIGMINE METHYLSULFATE 10 MG/10ML IV SOLN
INTRAVENOUS | Status: AC
  Filled 2014-10-27: qty 1

## 2014-10-27 MED ORDER — IOHEXOL 300 MG/ML  SOLN
INTRAMUSCULAR | Status: DC | PRN
  Administered 2014-10-27: 5 mL via INTRAVENOUS

## 2014-10-27 MED ORDER — BENAZEPRIL HCL 20 MG PO TABS
20.0000 mg | ORAL_TABLET | Freq: Every day | ORAL | Status: DC
  Administered 2014-10-27 – 2014-11-04 (×9): 20 mg via ORAL
  Filled 2014-10-27 (×9): qty 1

## 2014-10-27 MED ORDER — MIDAZOLAM HCL 2 MG/2ML IJ SOLN
INTRAMUSCULAR | Status: AC
  Filled 2014-10-27: qty 2

## 2014-10-27 MED ORDER — LACTATED RINGERS IV SOLN: INTRAVENOUS | Status: DC

## 2014-10-27 MED ORDER — DEXAMETHASONE SODIUM PHOSPHATE 10 MG/ML IJ SOLN
INTRAMUSCULAR | Status: DC | PRN
  Administered 2014-10-27: 10 mg via INTRAVENOUS

## 2014-10-27 MED ORDER — PROMETHAZINE HCL 25 MG/ML IJ SOLN
6.2500 mg | INTRAMUSCULAR | Status: DC | PRN
  Administered 2014-10-27: 12.5 mg via INTRAVENOUS

## 2014-10-27 MED ORDER — 0.9 % SODIUM CHLORIDE (POUR BTL) OPTIME
TOPICAL | Status: DC | PRN
  Administered 2014-10-27: 1000 mL

## 2014-10-27 MED ORDER — PROPOFOL 10 MG/ML IV BOLUS
INTRAVENOUS | Status: DC | PRN
  Administered 2014-10-27: 110 mg via INTRAVENOUS

## 2014-10-27 MED ORDER — DEXAMETHASONE SODIUM PHOSPHATE 10 MG/ML IJ SOLN
INTRAMUSCULAR | Status: AC
  Filled 2014-10-27: qty 1

## 2014-10-27 MED ORDER — ONDANSETRON HCL 4 MG/2ML IJ SOLN
INTRAMUSCULAR | Status: AC
  Filled 2014-10-27: qty 2

## 2014-10-27 MED ORDER — STERILE WATER FOR IRRIGATION IR SOLN
Status: DC | PRN
  Administered 2014-10-27: 1500 mL

## 2014-10-27 MED ORDER — ATORVASTATIN CALCIUM 20 MG PO TABS
20.0000 mg | ORAL_TABLET | Freq: Every day | ORAL | Status: DC
  Administered 2014-10-27 – 2014-11-03 (×7): 20 mg via ORAL
  Filled 2014-10-27 (×10): qty 1

## 2014-10-27 MED ORDER — EPHEDRINE SULFATE 50 MG/ML IJ SOLN
INTRAMUSCULAR | Status: DC | PRN
  Administered 2014-10-27: 5 mg via INTRAVENOUS

## 2014-10-27 MED ORDER — ONDANSETRON HCL 4 MG/2ML IJ SOLN
INTRAMUSCULAR | Status: DC | PRN
  Administered 2014-10-27: 4 mg via INTRAVENOUS

## 2014-10-27 MED ORDER — SODIUM CHLORIDE 0.9 % IJ SOLN
INTRAMUSCULAR | Status: AC
  Filled 2014-10-27: qty 3

## 2014-10-27 MED ORDER — ROCURONIUM BROMIDE 100 MG/10ML IV SOLN
INTRAVENOUS | Status: DC | PRN
  Administered 2014-10-27: 30 mg via INTRAVENOUS
  Administered 2014-10-27: 5 mg via INTRAVENOUS

## 2014-10-27 MED ORDER — LACTATED RINGERS IV SOLN
INTRAVENOUS | Status: DC | PRN
  Administered 2014-10-27 (×2): via INTRAVENOUS

## 2014-10-27 MED ORDER — NEOSTIGMINE METHYLSULFATE 10 MG/10ML IV SOLN
INTRAVENOUS | Status: DC | PRN
  Administered 2014-10-27: 2.5 mg via INTRAVENOUS

## 2014-10-27 MED ORDER — VITAMIN D3 25 MCG (1000 UNIT) PO TABS
1000.0000 [IU] | ORAL_TABLET | Freq: Every day | ORAL | Status: DC
  Administered 2014-10-27 – 2014-11-04 (×9): 1000 [IU] via ORAL
  Filled 2014-10-27 (×9): qty 1

## 2014-10-27 SURGICAL SUPPLY — 41 items
APPLIER CLIP 5 13 M/L LIGAMAX5 (MISCELLANEOUS) ×3
APPLIER CLIP ROT 10 11.4 M/L (STAPLE)
APR CLP MED LRG 11.4X10 (STAPLE)
APR CLP MED LRG 5 ANG JAW (MISCELLANEOUS) ×2
BAG SPEC RTRVL LRG 6X4 10 (ENDOMECHANICALS) ×2
CABLE HIGH FREQUENCY MONO STRZ (ELECTRODE) ×2 IMPLANT
CANISTER SUCTION 2500CC (MISCELLANEOUS) ×1 IMPLANT
CLIP APPLIE 5 13 M/L LIGAMAX5 (MISCELLANEOUS) ×1 IMPLANT
CLIP APPLIE ROT 10 11.4 M/L (STAPLE) IMPLANT
COVER MAYO STAND STRL (DRAPES) ×3 IMPLANT
DECANTER SPIKE VIAL GLASS SM (MISCELLANEOUS) ×3 IMPLANT
DRAPE C-ARM 42X120 X-RAY (DRAPES) ×3 IMPLANT
DRAPE LAPAROSCOPIC ABDOMINAL (DRAPES) ×3 IMPLANT
DRAPE UTILITY XL STRL (DRAPES) ×3 IMPLANT
DRAPE WARM FLUID 44X44 (DRAPE) ×3 IMPLANT
DRSG TEGADERM 2-3/8X2-3/4 SM (GAUZE/BANDAGES/DRESSINGS) ×9 IMPLANT
DRSG TEGADERM 4X4.75 (GAUZE/BANDAGES/DRESSINGS) ×3 IMPLANT
ELECT REM PT RETURN 9FT ADLT (ELECTROSURGICAL) ×3
ELECTRODE REM PT RTRN 9FT ADLT (ELECTROSURGICAL) ×2 IMPLANT
ENDOLOOP SUT PDS II  0 18 (SUTURE) ×1
ENDOLOOP SUT PDS II 0 18 (SUTURE) ×1 IMPLANT
GAUZE SPONGE 4X4 12PLY STRL (GAUZE/BANDAGES/DRESSINGS) ×2 IMPLANT
GLOVE ECLIPSE 8.0 STRL XLNG CF (GLOVE) ×3 IMPLANT
GLOVE INDICATOR 8.0 STRL GRN (GLOVE) ×3 IMPLANT
GOWN STRL REUS W/TWL XL LVL3 (GOWN DISPOSABLE) ×6 IMPLANT
KIT BASIN OR (CUSTOM PROCEDURE TRAY) ×3 IMPLANT
POUCH SPECIMEN RETRIEVAL 10MM (ENDOMECHANICALS) ×2 IMPLANT
SCISSORS LAP 5X35 DISP (ENDOMECHANICALS) ×3 IMPLANT
SET CHOLANGIOGRAPH MIX (MISCELLANEOUS) ×3 IMPLANT
SET IRRIG TUBING LAPAROSCOPIC (IRRIGATION / IRRIGATOR) ×3 IMPLANT
SLEEVE XCEL OPT CAN 5 100 (ENDOMECHANICALS) ×2 IMPLANT
SUT MNCRL AB 4-0 PS2 18 (SUTURE) ×3 IMPLANT
SUT PDS AB 0 CT1 36 (SUTURE) ×4 IMPLANT
SUT VIC AB 0 UR5 27 (SUTURE) ×2 IMPLANT
SYR 20CC LL (SYRINGE) ×3 IMPLANT
TOWEL OR 17X26 10 PK STRL BLUE (TOWEL DISPOSABLE) ×3 IMPLANT
TOWEL OR NON WOVEN STRL DISP B (DISPOSABLE) ×3 IMPLANT
TRAY LAPAROSCOPIC (CUSTOM PROCEDURE TRAY) ×3 IMPLANT
TROCAR BLADELESS OPT 5 100 (ENDOMECHANICALS) ×3 IMPLANT
TROCAR XCEL NON-BLD 11X100MML (ENDOMECHANICALS) ×3 IMPLANT
TUBING INSUFFLATION 10FT LAP (TUBING) ×3 IMPLANT

## 2014-10-27 NOTE — Progress Notes (Signed)
UR completed 

## 2014-10-27 NOTE — Anesthesia Preprocedure Evaluation (Addendum)
Anesthesia Evaluation  Patient identified by MRN, date of birth, ID band Patient awake    Reviewed: Allergy & Precautions, H&P , NPO status , Patient's Chart, lab work & pertinent test results, reviewed documented beta blocker date and time   Airway Mallampati: II  TM Distance: >3 FB Neck ROM: full    Dental  (+) Caps, Dental Advisory Given Several caps upper front teeth. Chipped left upper front tooth.:   Pulmonary neg pulmonary ROS, shortness of breath and with exertion,  breath sounds clear to auscultation  Pulmonary exam normal       Cardiovascular hypertension, Pt. on home beta blockers and Pt. on medications + Valvular Problems/Murmurs AI Rhythm:regular Rate:Normal + Diastolic murmurs Palpitations. RBBB   Neuro/Psych negative neurological ROS  negative psych ROS   GI/Hepatic Neg liver ROS, hiatal hernia, GERD-  Medicated,Barrett's esophagus   Endo/Other  negative endocrine ROS  Renal/GU negative Renal ROS  negative genitourinary   Musculoskeletal   Abdominal   Peds  Hematology negative hematology ROS (+)   Anesthesia Other Findings   Reproductive/Obstetrics negative OB ROS                         Anesthesia Physical Anesthesia Plan  ASA: III  Anesthesia Plan: General   Post-op Pain Management:    Induction: Intravenous, Rapid sequence and Cricoid pressure planned  Airway Management Planned: Oral ETT  Additional Equipment:   Intra-op Plan:   Post-operative Plan: Extubation in OR  Informed Consent: I have reviewed the patients History and Physical, chart, labs and discussed the procedure including the risks, benefits and alternatives for the proposed anesthesia with the patient or authorized representative who has indicated his/her understanding and acceptance.   Dental Advisory Given  Plan Discussed with: CRNA and Surgeon  Anesthesia Plan Comments:         Anesthesia  Quick Evaluation

## 2014-10-27 NOTE — Progress Notes (Signed)
Pt refuses heparin. States was told not to take blood thinners since she has had problems with GI bleeding in the past. Explained to pt and to grand daughter the heparin is to prevent blood clots, which could lead to  her death. Pt states she will do leg exercises and wear PAS hose any time she is in bed but does not wish to take blood thinner. Dr Excell Seltzer notified and ask me to stress importance of heparin and death as a possible outcome of refusing heparin . Discussed with Ms Lawn. She continues to refuse and says she may call her GI doctor and ask her since she knows her (patients) history.  I told pt to considerate it and if she changes her mind to let me know and that we would continue to offer her the heparin every eight hours.

## 2014-10-27 NOTE — Op Note (Signed)
10/26/2014 - 10/27/2014  12:21 PM  PATIENT:  Terri Shah  80 y.o. female  Patient Care Team: Precious Reel, MD as PCP - General (Internal Medicine) Lafayette Dragon, MD as Consulting Physician (Gastroenterology)  PRE-OPERATIVE DIAGNOSIS:  cholecystitis  POST-OPERATIVE DIAGNOSIS:    Acute cholecystitis with cholelithiasis Empyema of the gallbladder  PROCEDURE:  Procedure(s): LAPAROSCOPIC CHOLECYSTECTOMY SINGLE SITE WITH INTRAOPERATIVE CHOLANGIOGRAM LAPAROSCOPIC LYSIS OF ADHESIONS WEDGE LIVER BIOPSY  SURGEON:  Surgeon(s): Michael Boston, MD  ASSISTANT: RN   ANESTHESIA:   local and general  EBL:  Total I/O In: 1300 [I.V.:1300] Out: -   Delay start of Pharmacological VTE agent (>24hrs) due to surgical blood loss or risk of bleeding:  no  DRAINS: none   SPECIMEN:  Source of Specimen:  Gallbladder   DISPOSITION OF SPECIMEN:  PATHOLOGY  COUNTS:  YES  PLAN OF CARE: Admit to inpatient   PATIENT DISPOSITION:  PACU - hemodynamically stable.  INDICATION:   Pleasant elderly woman with chronic controlled reflux and diverticular issues.  Has history physical and CT scan findings suspicious for cholecystitis.  Recommendation made for cholecystectomy:  The anatomy & physiology of hepatobiliary & pancreatic function was discussed.  The pathophysiology of gallbladder dysfunction was discussed.  Natural history risks without surgery was discussed.   I feel the risks of no intervention will lead to serious problems that outweigh the operative risks; therefore, I recommended cholecystectomy to remove the pathology.  I explained laparoscopic techniques with possible need for an open approach.  Probable cholangiogram to evaluate the bilary tract was explained as well.    Risks such as bleeding, infection, abscess, leak, injury to other organs, need for further treatment, heart attack, death, and other risks were discussed.  I noted a good likelihood this will help address the problem.   Possibility that this will not correct all abdominal symptoms was explained.  Goals of post-operative recovery were discussed as well.  We will work to minimize complications.  An educational handout further explaining the pathology and treatment options was given as well.  Questions were answered.  The patient expresses understanding & wishes to proceed with surgery.   OR FINDINGS: dilated distended gallbladder with purulent ascites.  Ambien I am gallbladder.  No evidence of duodenitis or ulcer or other abnormalities.  Fatty change of the liver.  Rather fragile.  DESCRIPTION:   The patient was identified & brought in the operating room. The patient was positioned supine with arms tucked. SCDs were active during the entire case. The patient underwent general anesthesia without any difficulty.  The abdomen was prepped and draped in a sterile fashion. A Surgical Timeout confirmed our plan.  I made a transverse curvilinear incision through the superior umbilical fold.  I placed a 92mm long port through the supraumbilical fascia using a modified Hassan cutdown technique. I began carbon dioxide insufflation. Camera inspection revealed no injury. There were no adhesions to the anterior abdominal wall supraumbilically.  I proceeded to continue with single site technique. I placed a #5 port in left upper aspect of the wound. I placed a 5 mm atraumatic grasper in the right inferior aspect of the wound.  I turned attention to the right upper quadrant.  A very distended gallbladder could be seen.  Numerous adhesions noted.  We freed the greater omentum, mesial colon, colon, duodenum off this.  I elevated the gallbladder.  Decompressed some purulent bile consistent with empyema of the gallbladder.  Very large stone and infundibulum noted.  The gallbladder  fundus was elevated cephalad. I freed the peritoneal coverings between the gallbladder and the liver on the posteriolateral and anteriomedial walls. I alternated  between Harmonic & blunt Maryland dissection to help get a good critical view of the cystic artery and cystic duct. I did further dissection to free 80% of the gallbladder off the liver bed to get an excellent critical view of the infundibulum and cystic duct. I mobilized the cystic artery; and, after getting a good 360 view, ligated the cystic artery using the Harmonic ultrasonic dissection. I skeletonized the cystic duct.    I placed a clip on the infundibulum. I did a partial cystic duct-otomy and ensured patency. I placed a 5 [place] cholangiocatheter through a puncture site at the right subcostal ridge of the abdominal wall and directed it into the cystic duct.  We ran a cholangiogram with dilute radio-opaque contrast and continuous fluoroscopy.  Contrast slowly flowed from a side branch consistent with cystic duct cannulization.  Contrast flowed down the common bile duct easily across the normal ampulla into the duodenum.  He was difficult to get good reflux but eventually got some reflux on the second run up the common hepatic duct into the left and right chains.This was consistent with a normal cholangiogram aside of poor evaluation of intrahepatic ducts.  I removed the cholangiocatheter.   Again did careful reinspection to confirm that the infundibular/cystic duct cannulization had occurred.  Good critical view  I placed clips on the cystic duct x4.  I completed cystic duct transection.  Because the tissues were inflamed and poor, I did further ligation of the cystic duct stump with 0 PDS Endoloop.  I freed the gallbladder from its remaining attachments to the liver. I had to remove a wedge of the liver as the gallbladder was rather intrahepatic.  I ensured hemostasis on the gallbladder fossa of the liver and elsewhere. I did copious irrigation.  After 4 L, the area cleaned up well.  I inspected the rest of the abdomen & detected no injury nor bleeding elsewhere.  I removed the gallbladder and wedge  liver resection out the supraumbilical fascia inside an Endo Catch bag.. I closed the fascia transversely using 0 Vicryl interrupted stitches. A closed the skin using 4-0 monocryl stitch.  Sterile dressing was applied. The patient was extubated & arrived in the PACU in stable condition..  I had discussed postoperative care with the patient in the holding area.  I am about to locate the patient's family and discuss operative findings and postoperative goals / instructions.  Because of the empyema the gallbladder, I suspect the patient will need antibiotics for 5 days. Instructions are written in the chart as well.  Adin Hector, M.D., F.A.C.S. Gastrointestinal and Minimally Invasive Surgery [facility] [address] [place], [place] [address] [phone] Main / Paging

## 2014-10-27 NOTE — Anesthesia Postprocedure Evaluation (Signed)
  Anesthesia Post-op Note  Patient: Terri Shah  Procedure(s) Performed: Procedure(s) (LRB): LAPAROSCOPIC CHOLECYSTECTOMY SINGLE SITE WITH INTRAOPERATIVE CHOLANGIOGRAM (N/A)  Patient Location: PACU  Anesthesia Type: General  Level of Consciousness: awake and alert   Airway and Oxygen Therapy: Patient Spontanous Breathing  Post-op Pain: mild  Post-op Assessment: Post-op Vital signs reviewed, Patient's Cardiovascular Status Stable, Respiratory Function Stable, Patent Airway and No signs of Nausea or vomiting  Last Vitals:  Filed Vitals:   10/27/14 1324  BP: 154/71  Pulse: 74  Temp: 36.8 C  Resp: 16    Post-op Vital Signs: stable   Complications: No apparent anesthesia complications

## 2014-10-27 NOTE — Transfer of Care (Signed)
Immediate Anesthesia Transfer of Care Note  Patient: Terri Shah  Procedure(s) Performed: Procedure(s): LAPAROSCOPIC CHOLECYSTECTOMY SINGLE SITE WITH INTRAOPERATIVE CHOLANGIOGRAM (N/A)  Patient Location: PACU  Anesthesia Type:General  Level of Consciousness: awake, alert , oriented and patient cooperative  Airway & Oxygen Therapy: Patient Spontanous Breathing and Patient connected to face mask oxygen  Post-op Assessment: Report given to PACU RN, Post -op Vital signs reviewed and stable and Patient moving all extremities X 4  Post vital signs: stable  Complications: No apparent anesthesia complications

## 2014-10-28 ENCOUNTER — Telehealth: Payer: Self-pay | Admitting: Internal Medicine

## 2014-10-28 ENCOUNTER — Encounter (HOSPITAL_COMMUNITY): Payer: Self-pay | Admitting: Surgery

## 2014-10-28 LAB — URINE CULTURE
Colony Count: 15000
Special Requests: NORMAL

## 2014-10-28 NOTE — Discharge Summary (Signed)
Physician Discharge Summary  Patient ID: Terri Shah MRN: [id number] DOB/AGE: 26-Jan-1934 80 y.o.  Admit date: 10/26/2014 Discharge date: 11/04/2014  Admission Diagnoses:   Acute calculous cholecystitis Chest pain Barrett esophagus History of GI diverticular bleed  Hypertension  Discharge Diagnoses:  Acute calculous cholecystitis Post op ileus and fluid collection GERD  Chest pain  Barrett esophagus  History of GI diverticular bleed  Hypertension Fibromyalgia  Hyponatremia,  low phosphate  Principal Problem:   Acute calculous cholecystitis Active Problems:   Ileus, postoperative   Hypertension   Abdominal fluid collection   Hyponatremia   GERD   Chest pain   Barrett esophagus   History of GI diverticular bleed   PROCEDURES: LAPAROSCOPIC CHOLECYSTECTOMY SINGLE SITE WITH INTRAOPERATIVE CHOLANGIOGRAM,LAPAROSCOPIC LYSIS OF ADHESIONS WEDGE LIVER BIOPSY, Dr. Michael Boston, 10/27/14.   Hospital Course: Patient noted sharp chest and upper abdominal epigastric type pain. Pain more vague but seems worst in right side. Went to emergency room. Required IV narcotics. Happened a few hours after eating breakfast. Recalls a couple smaller attacks in the past. Not as severe as this. Usually went away. This day in time the attack persisted and became intense. Therefore went to emergency room this afternoon. [facility] [place]. This does not feel like heartburn or reflux attacks.  Based on concerns, CT scan done. Showed large gallstone with some inflammation. Emergency room physician concerned about cholecystitis. Surgical consultation requested. Patient transferred from [facility] ER to [facility]. No exertional chest/neck/shoulder/arm pain. Patient can walk 60 minutes for about 1.5 miles without difficulty. No personal nor family history of GI/colon cancer, inflammatory bowel disease, irritable bowel syndrome, allergy such as Celiac Sprue, dietary/dairy  problems, colitis, ulcers nor gastritis. No recent sick contacts/gastroenteritis. No travel outside the country. No changes in diet. No dysphagia to solids or liquids. No significant heartburn or reflux. No hematochezia, hematemesis, coffee ground emesis. No evidence of prior gastric/peptic ulceration.  She was seen and admitted by Dr. Johney [place], and taken to the OR the following day.  Post op he recommends antibiotics for 5 days.  She is feeling bloated and not ready for discharge the 1st post op day.  She was mobilized more and her diet was advanced.  Unfortunately she felt worse and developed intractable nausea, with dry heaves, not much vomiting.  CT scan was obtained and showed a fluid collection at [facility] site, with moderate ascites.  Her WBC rose and IR drain was ordered and performed on 10/30/14.  Fluid was unremarkable it looked like ascites, with a reddish tinge.  She also developed some hyponatremia. POD 4 she felt better, but no flatus or BM, in the AM, in the PM she complained of her food feeling like it stops mid chest, and her abdomen was distended.  We put her back on clears and got a film confirming some ileus.  She had some loose stools on 11/14.  Her diet was advanced, she had her antibiotics stopped 11/15, and by 11/16 she was tolerating diet and ready to go home.  Drain was removed by IR prior to discharge.  We ask Medicine to see and help with her hyponatremia, which is an issue she has had in the past Na got down to 128.  WBC got up to 12.7.  Labs at discharge are below. CMP     Component Value Date/Time   NA 129* 11/04/2014 0403   K 3.3* 11/04/2014 0403   CL 95* 11/04/2014 0403   CO2 24 11/04/2014 0403  GLUCOSE 93 11/04/2014 0403   BUN <3* 11/04/2014 0403   CREATININE 0.43* 11/04/2014 0403   CALCIUM 7.6* 11/04/2014 0403   PROT 5.5* 10/31/2014 0458   ALBUMIN 2.2* 10/31/2014 0458   AST 17 10/31/2014 0458   ALT 25 10/31/2014 0458   ALKPHOS 36* 10/31/2014 0458   BILITOT 0.7  10/31/2014 0458   GFRNONAA >90 11/04/2014 0403   GFRAA >90 11/04/2014 0403   CBC Latest Ref Rng 11/03/2014 11/02/2014 11/01/2014  WBC 4.0 - 10.5 K/uL 8.5 11.3(H) 12.7(H)  Hemoglobin 12.0 - 15.0 g/dL 9.8(L) 11.1(L) 10.1(L)  Hematocrit 36.0 - 46.0 % 29.2(L) 33.1(L) 30.7(L)  Platelets 150 - 400 K/uL 422(H) 351 313     Condition on d/c:  Improving   Disposition: 01-Home or Self Care     Medication List    TAKE these medications        acetaminophen 500 MG tablet  Commonly known as:  TYLENOL  Take 500 mg by mouth every 6 (six) hours as needed. Patient used this medication for her pain.     amitriptyline 25 MG tablet  Commonly known as:  ELAVIL  Take 25 mg by mouth at bedtime.     atorvastatin 20 MG tablet  Commonly known as:  LIPITOR  Take 20 mg by mouth daily.     benazepril 20 MG tablet  Commonly known as:  LOTENSIN  Take 20 mg by mouth daily.     cholecalciferol 1000 UNITS tablet  Commonly known as:  VITAMIN D  Take 1,000 Units by mouth daily.     COMBIGAN 0.2-0.5 % ophthalmic solution  Generic drug:  brimonidine-timolol  Place 1 drop into the right eye every 12 (twelve) hours.     denosumab 60 MG/ML Soln injection  Commonly known as:  PROLIA  Inject 60 mg into the skin every 6 (six) months. Administer in upper arm, thigh, or abdomen     hydrochlorothiazide 12.5 MG capsule  Commonly known as:  MICROZIDE  Take 12.5 mg by mouth daily. Take 1 tab every other day.     METAMUCIL PO  Take 1 each by mouth at bedtime.     omeprazole 20 MG capsule  Commonly known as:  PRILOSEC  Take 20 mg by mouth daily.     traMADol 50 MG tablet  Commonly known as:  ULTRAM  Take 50 mg by mouth every 6 (six) hours as needed for moderate pain.       Follow-up Information    Follow up with Adin Hector., MD On 11/19/2014.   Specialty:  General Surgery   Why:  Your appointment is for 3:00PM, be at the office 30 minutes early for check in.   Contact information:   [address] [place] [address] [phone]       Follow up with Precious Reel, MD.   Specialty:  Internal Medicine   Why:  Let him see you back and follow up on your low Sodium.   Contact information:   [address] [place] [address] [phone]       Signed: Earnstine Regal 11/04/2014, 3:24 PM

## 2014-10-28 NOTE — Discharge Instructions (Signed)
Cholecystitis Cholecystitis is an inflammation of your gallbladder. It is usually caused by a buildup of gallstones or sludge (cholelithiasis) in your gallbladder. The gallbladder stores a fluid that helps digest fats (bile). Cholecystitis is serious and needs treatment right away.  CAUSES   Gallstones. Gallstones can block the tube that leads to your gallbladder, causing bile to build up. As bile builds up, the gallbladder becomes inflamed.  Bile duct problems, such as blockage from scarring or kinking.  Tumors. Tumors can stop bile from leaving your gallbladder correctly, causing bile to build up. As bile builds up, the gallbladder becomes inflamed. SYMPTOMS   Nausea.  Vomiting.  Abdominal pain, especially in the upper right area of your abdomen.  Abdominal tenderness or bloating.  Sweating.  Chills.  Fever.  Yellowing of the skin and the whites of the eyes (jaundice). DIAGNOSIS  Your caregiver may order blood tests to look for infection or gallbladder problems. Your caregiver may also order imaging tests, such as an ultrasound or computed tomography (CT) scan. Further tests may include a hepatobiliary iminodiacetic acid (HIDA) scan. This scan allows your caregiver to see your bile move from the liver to the gallbladder and to the small intestine. TREATMENT  A hospital stay is usually necessary to lessen the inflammation of your gallbladder. You may be required to not eat or drink (fast) for a certain amount of time. You may be given medicine to treat pain or an antibiotic medicine to treat an infection. Surgery may be needed to remove your gallbladder (cholecystectomy) once the inflammation has gone down. Surgery may be needed right away if you develop complications such as death of gallbladder tissue (gangrene) or a tear (perforation) of the gallbladder.  [place] care will depend on your treatment. In general:  If you were given antibiotics, take them as  directed. Finish them even if you start to feel better.  Only take over-the-counter or prescription medicines for pain, discomfort, or fever as directed by your caregiver.  Follow a low-fat diet until you see your caregiver again.  Keep all follow-up visits as directed by your caregiver. SEEK IMMEDIATE MEDICAL CARE IF:   Your pain is increasing and not controlled by medicines.  Your pain moves to another part of your abdomen or to your back.  You have a fever.  You have nausea and vomiting. MAKE SURE YOU:  Understand these instructions.  Will watch your condition.  Will get help right away if you are not doing well or get worse. Document Released: 12/06/2005 Document Revised: 02/28/2012 Document Reviewed: 10/22/2011 [facility] Patient Information 2015 [place], [place]. This information is not intended to replace advice given to you by your health care provider. Make sure you discuss any questions you have with your health care provider.  CCS ______[facility] LAPAROSCOPIC SURGERY: POST OP INSTRUCTIONS Always review your discharge instruction sheet given to you by the facility where your surgery was performed. IF YOU HAVE DISABILITY OR FAMILY LEAVE FORMS, YOU MUST BRING THEM TO THE OFFICE FOR PROCESSING.   DO NOT GIVE THEM TO YOUR DOCTOR.  1. A prescription for pain medication may be given to you upon discharge.  Take your pain medication as prescribed, if needed.  If narcotic pain medicine is not needed, then you may take acetaminophen (Tylenol) or ibuprofen (Advil) as needed. 2. Take your usually prescribed medications unless otherwise directed. 3. If you need a refill on your pain medication, please contact your pharmacy.  They will contact  our office to request authorization. Prescriptions will not be filled after 5pm or on week-ends. 4. You should follow a light diet the first few days after arrival home, such as soup and crackers, etc.  Be sure to include lots  of fluids daily. 5. Most patients will experience some swelling and bruising in the area of the incisions.  Ice packs will help.  Swelling and bruising can take several days to resolve.  6. It is common to experience some constipation if taking pain medication after surgery.  Increasing fluid intake and taking a stool softener (such as Colace) will usually help or prevent this problem from occurring.  A mild laxative (Milk of Magnesia or Miralax) should be taken according to package instructions if there are no bowel movements after 48 hours. 7. Unless discharge instructions indicate otherwise, you may remove your bandages 24-48 hours after surgery, and you may shower at that time.  You may have steri-strips (small skin tapes) in place directly over the incision.  These strips should be left on the skin for 7-10 days.  If your surgeon used skin glue on the incision, you may shower in 24 hours.  The glue will flake off over the next 2-3 weeks.  Any sutures or staples will be removed at the office during your follow-up visit. 8. ACTIVITIES:  You may resume regular (light) daily activities beginning the next day--such as daily self-care, walking, climbing stairs--gradually increasing activities as tolerated.  You may have sexual intercourse when it is comfortable.  Refrain from any heavy lifting or straining until approved by your doctor. a. You may drive when you are no longer taking prescription pain medication, you can comfortably wear a seatbelt, and you can safely maneuver your car and apply brakes. b. RETURN TO WORK:  __________________________________________________________ 9. You should see your doctor in the office for a follow-up appointment approximately 2-3 weeks after your surgery.  Make sure that you call for this appointment within a day or two after you arrive home to insure a convenient appointment time. 10. OTHER INSTRUCTIONS:  __________________________________________________________________________________________________________________________ __________________________________________________________________________________________________________________________ WHEN TO CALL YOUR DOCTOR: 1. Fever over 101.0 2. Inability to urinate 3. Continued bleeding from incision. 4. Increased pain, redness, or drainage from the incision. 5. Increasing abdominal pain  The clinic staff is available to answer your questions during regular business hours.  Please dont hesitate to call and ask to speak to one of the nurses for clinical concerns.  If you have a medical emergency, go to the nearest emergency room or call 911.  A surgeon from [facility] is always on call at the hospital. [address], [place], [place], [place]  [address] ? [address] [place], [place], [place]   [address] [phone] ? [phone] ? FAX [phone] Web site: [email or web address]

## 2014-10-28 NOTE — Plan of Care (Signed)
Problem: Phase II Progression Outcomes Goal: Progressing with IS, TCDB Outcome: Completed/Met Date Met:  10/28/14

## 2014-10-28 NOTE — Plan of Care (Signed)
Problem: Phase II Progression Outcomes Goal: Tolerating diet Outcome: Completed/Met Date Met:  10/28/14     

## 2014-10-28 NOTE — Plan of Care (Signed)
Problem: Phase II Progression Outcomes Goal: Surgical site without signs of infection Outcome: Completed/Met Date Met:  10/28/14

## 2014-10-28 NOTE — Plan of Care (Signed)
Problem: Phase II Progression Outcomes Goal: Discharge plan established Outcome: Completed/Met Date Met:  10/28/14     

## 2014-10-28 NOTE — Plan of Care (Signed)
Problem: Phase II Progression Outcomes Goal: Dressings dry/intact Outcome: Completed/Met Date Met:  10/28/14     

## 2014-10-28 NOTE — Progress Notes (Signed)
UR completed 

## 2014-10-28 NOTE — Plan of Care (Signed)
Problem: Phase II Progression Outcomes Goal: Vital signs stable Outcome: Completed/Met Date Met:  10/28/14

## 2014-10-28 NOTE — Plan of Care (Signed)
Problem: Phase II Progression Outcomes Goal: Foley discontinued Outcome: Completed/Met Date Met:  10/28/14

## 2014-10-28 NOTE — Plan of Care (Signed)
Problem: Phase II Progression Outcomes Goal: Progress activity as tolerated unless otherwise ordered Outcome: Completed/Met Date Met:  10/28/14     

## 2014-10-28 NOTE — Plan of Care (Signed)
Problem: Phase II Progression Outcomes Goal: Sutures/staples intact Outcome: Completed/Met Date Met:  10/28/14

## 2014-10-28 NOTE — Telephone Encounter (Signed)
Spoke with patient and told her she needs talk with her admitting MD if we are needed they will call us for consult.

## 2014-10-28 NOTE — Progress Notes (Signed)
1 Day Post-Op lap chole Subjective: Feeling bloated.  Pain controlled.  No flatus.  No nausea  Objective: Vital signs in last 24 hours: Temp:  [97.8 F (36.6 C)-98.9 F (37.2 C)] 98 F (36.7 C) (11/09 9323) Pulse Rate:  [73-105] 73 (11/09 0619) Resp:  [14-30] 18 (11/09 0619) BP: (138-165)/(67-88) 160/76 mmHg (11/09 0619) SpO2:  [97 %-100 %] 100 % (11/09 0619)   Intake/Output from previous day: 11/08 0701 - 11/09 0700 In: 3850 [I.V.:3800; IV Piggyback:50] Out: 2750 [Urine:2750] Intake/Output this shift: Total I/O In: 240 [P.O.:240] Out: 0    General appearance: alert and cooperative GI: normal findings: soft, appropriately tender  Incision: no significant drainage  Lab Results:   Recent Labs  10/26/14 1540  WBC 12.4*  HGB 12.8  HCT 37.2  PLT 308   BMET  Recent Labs  10/26/14 1540  NA 131*  K 3.7  CL 90*  CO2 23  GLUCOSE 139*  BUN 9  CREATININE 0.50  CALCIUM 9.8   PT/INR No results for input(s): LABPROT, INR in the last 72 hours. ABG No results for input(s): PHART, HCO3 in the last 72 hours.  Invalid input(s): PCO2, PO2  MEDS, Scheduled . amitriptyline  25 mg Oral QHS  . atorvastatin  20 mg Oral q1800  . benazepril  20 mg Oral Daily  . brimonidine  1 drop Right Eye Q12H   And  . timolol  1 drop Right Eye Q12H  . cefTRIAXone (ROCEPHIN)  IV  2 g Intravenous Q24H  . cholecalciferol  1,000 Units Oral Daily  . heparin  5,000 Units Subcutaneous 3 times per day  . lip balm  1 application Topical BID  . pantoprazole  40 mg Oral Daily  . polyethylene glycol  17 g Oral BID  . saccharomyces boulardii  250 mg Oral BID  . sodium chloride  3 mL Intravenous Q12H    Studies/Results: Dg Chest 2 View  10/26/2014   CLINICAL DATA:  Chest and abdomen pain for 5 hr. Some nausea. No injury.  EXAM: CHEST  2 VIEW  COMPARISON:  10/19/2009.  FINDINGS: Cardiac silhouette is normal in size. No mediastinal or hilar masses. Lungs are mildly hyperexpanded with minor  scarring at the apices. No lung consolidation or edema. No pleural effusion or pneumothorax.  Bony thorax is demineralized but intact.  IMPRESSION: No acute cardiopulmonary disease.   Electronically Signed   By: Lajean Manes M.D.   On: 10/26/2014 16:09   Dg Cholangiogram Operative  10/27/2014   CLINICAL DATA:  Intraoperative cholangiogram  EXAM: INTRAOPERATIVE CHOLANGIOGRAM  TECHNIQUE: Cholangiographic images from the C-arm fluoroscopic device were submitted for interpretation post-operatively. Please see the procedural report for the amount of contrast and the fluoroscopy time utilized.  FLUOROSCOPY TIME:  41 seconds  COMPARISON:  None.  FINDINGS: Dilated common duct.  No filling defect/choledocholithiasis is seen.  Contrast opacifies the proximal duodenum.  Mild reflux of contrast into the intrahepatic ducts demonstrates no intrahepatic ductal dilatation.  IMPRESSION: Intraoperative cholangiogram, as above.   Electronically Signed   By: Julian Hy M.D.   On: 10/27/2014 12:07   Ct Abdomen Pelvis W Contrast  10/26/2014   CLINICAL DATA:  Nausea/ vomiting, abdominal pain, chest pain  EXAM: CT ABDOMEN AND PELVIS WITH CONTRAST  TECHNIQUE: Multidetector CT imaging of the abdomen and pelvis was performed using the standard protocol following bolus administration of intravenous contrast.  CONTRAST:  100 mL Omnipaque 300 IV  COMPARISON:  None.  FINDINGS: Lower chest:  Mild  scarring/ atelectasis of the lung bases.  Contrast in the distal esophagus, suggesting gastroesophageal reflux or esophageal dysmotility.  Hepatobiliary: Liver is notable for a 2.7 cm cyst in the medial segment left hepatic lobe (series 2/ image 22) with and an additional 3.6 cm cyst in the caudate (series 2/image 16.  Gallbladder is notable for a 2.7 cm stone in the gallbladder neck with mild gallbladder wall thickening/edema (series 2/image 30), raising concern for acute cholecystitis.  Dilated common duct, measuring 10 mm (series 5/ image  40). Distal CBD abruptly tapers at the ampulla, without definite common duct calculus visualized on CT.  Pancreas: Within normal limits.  Spleen: Within normal limits.  Adrenals/Urinary Tract: Adrenal glands are unremarkable.  Kidneys are within normal limits.  No hydronephrosis.  Bladder is unremarkable.  Stomach/Bowel: Stomach is unremarkable.  No evidence of bowel obstruction.  Normal appendix.  Extensive colonic diverticulosis, without associated inflammatory changes.  Vascular/Lymphatic: Atherosclerotic calcifications of the abdominal aorta and branch vessels.  No suspicious abdominopelvic lymphadenopathy.  Reproductive: Uterus is unremarkable.  Bilateral ovaries are grossly unremarkable.  Other: No abdominopelvic ascites.  Small fat containing right inguinal hernia.  Musculoskeletal: Degenerative changes of the visualized thoracolumbar spine with mild lumbar levoscoliosis.  IMPRESSION: Cholelithiasis with gallbladder wall thickening/ edema, concerning for acute cholecystitis.  Common duct is dilated, measuring 10 mm, and abruptly tapers at the ampulla. No definite common duct calculus is evident on CT.   Electronically Signed   By: Julian Hy M.D.   On: 10/26/2014 18:23   Dg Abd Acute W/chest  10/26/2014   CLINICAL DATA:  Abdominal pain, nausea  EXAM: ACUTE ABDOMEN SERIES (ABDOMEN 2 VIEW & CHEST 1 VIEW)  COMPARISON:  None.  FINDINGS: Several mildly dilated loops of probable small bowel noted over the right upper quadrant, largest caliber 3.5 cm. Colon is normal in caliber. No free air beneath the diaphragm allowing for bowel interposition over the liver. No abnormal radiopacity.  IMPRESSION: Several mildly dilated loops of small bowel over the right upper quadrant which could indicate early small bowel obstruction or focal ileus.   Electronically Signed   By: Conchita Paris M.D.   On: 10/26/2014 16:11    Assessment: s/p Procedure(s): LAPAROSCOPIC CHOLECYSTECTOMY SINGLE SITE WITH INTRAOPERATIVE  CHOLANGIOGRAM Patient Active Problem List   Diagnosis Date Noted  . Chest pain 10/26/2014  . Acute calculous cholecystitis 10/26/2014  . Barrett esophagus   . History of GI diverticular bleed   . Hypertension   . Dyspnea 01/18/2012  . Aortic insufficiency 01/18/2012  . ANEMIA, IRON DEFICIENCY 06/04/2010  . HYPERCHOLESTEROLEMIA 02/08/2008  . GERD 02/08/2008  . ARTHRITIS 02/08/2008  . HIATAL HERNIA 11/02/2007  . DIVERTICULOSIS OF COLON 04/07/2004    Doing well  Plan: Advance diet as tolerated Ambulate.  Discussed importance of SQ heparin with pt.  Needs to ambulate 500 ft per day to avoid need for heparin.   Possible d/c in AM   LOS: 2 days     .Rosario Adie, MD [facility], [place]   10/28/2014 11:08 AM

## 2014-10-28 NOTE — Progress Notes (Signed)
Nutrition Brief Note  Patient identified on the Malnutrition Screening Tool (MST) Report  S/P LAPAROSCOPIC CHOLECYSTECTOMY SINGLE SITE WITH INTRAOPERATIVE CHOLANGIOGRAM.  No recent weight changes.  Wt Readings from Last 15 Encounters:  10/26/14 126 lb (57.153 kg)  06/12/14 128 lb 12.8 oz (58.423 kg)  10/31/13 133 lb (60.328 kg)  10/15/13 133 lb (60.328 kg)  01/18/12 133 lb (60.328 kg)  11/02/11 134 lb (60.782 kg)  06/04/10 136 lb 4 oz (61.803 kg)  04/21/10 136 lb (61.689 kg)  04/08/10 134 lb (60.782 kg)  04/02/10 135 lb 8 oz (61.462 kg)  10/23/09 137 lb 9.6 oz (62.415 kg)    Body mass index is 23.82 kg/(m^2). Patient meets criteria for normal range based on current BMI.   Current diet order is heart healthy, patient is consuming approximately 75% of meals at this time. Labs and medications reviewed.   No nutrition interventions warranted at this time. If nutrition issues arise, please consult RD.   Clayton Bibles, MS, RD, LDN Pager: [phone] After Hours Pager: [phone]

## 2014-10-28 NOTE — Plan of Care (Signed)
Problem: Phase I Progression Outcomes Goal: Pain controlled with appropriate interventions Outcome: Completed/Met Date Met:  10/28/14     

## 2014-10-28 NOTE — Plan of Care (Signed)
Problem: Phase II Progression Outcomes Goal: Pain controlled Outcome: Completed/Met Date Met:  10/28/14     

## 2014-10-29 ENCOUNTER — Inpatient Hospital Stay (HOSPITAL_COMMUNITY): Payer: Medicare PPO

## 2014-10-29 DIAGNOSIS — Z79891 Long term (current) use of opiate analgesic: Secondary | ICD-10-CM | POA: Diagnosis not present

## 2014-10-29 DIAGNOSIS — M1990 Unspecified osteoarthritis, unspecified site: Secondary | ICD-10-CM | POA: Diagnosis present

## 2014-10-29 DIAGNOSIS — I10 Essential (primary) hypertension: Secondary | ICD-10-CM | POA: Diagnosis present

## 2014-10-29 DIAGNOSIS — M25511 Pain in right shoulder: Secondary | ICD-10-CM | POA: Diagnosis present

## 2014-10-29 DIAGNOSIS — Y839 Surgical procedure, unspecified as the cause of abnormal reaction of the patient, or of later complication, without mention of misadventure at the time of the procedure: Secondary | ICD-10-CM | POA: Diagnosis not present

## 2014-10-29 DIAGNOSIS — I4510 Unspecified right bundle-branch block: Secondary | ICD-10-CM | POA: Diagnosis present

## 2014-10-29 DIAGNOSIS — K810 Acute cholecystitis: Secondary | ICD-10-CM | POA: Diagnosis present

## 2014-10-29 DIAGNOSIS — Z9842 Cataract extraction status, left eye: Secondary | ICD-10-CM | POA: Diagnosis not present

## 2014-10-29 DIAGNOSIS — M797 Fibromyalgia: Secondary | ICD-10-CM | POA: Diagnosis present

## 2014-10-29 DIAGNOSIS — R079 Chest pain, unspecified: Secondary | ICD-10-CM | POA: Diagnosis present

## 2014-10-29 DIAGNOSIS — G8929 Other chronic pain: Secondary | ICD-10-CM | POA: Diagnosis present

## 2014-10-29 DIAGNOSIS — Z8249 Family history of ischemic heart disease and other diseases of the circulatory system: Secondary | ICD-10-CM | POA: Diagnosis not present

## 2014-10-29 DIAGNOSIS — Z803 Family history of malignant neoplasm of breast: Secondary | ICD-10-CM | POA: Diagnosis not present

## 2014-10-29 DIAGNOSIS — K219 Gastro-esophageal reflux disease without esophagitis: Secondary | ICD-10-CM | POA: Diagnosis present

## 2014-10-29 DIAGNOSIS — K913 Postprocedural intestinal obstruction: Secondary | ICD-10-CM | POA: Diagnosis not present

## 2014-10-29 DIAGNOSIS — Z833 Family history of diabetes mellitus: Secondary | ICD-10-CM | POA: Diagnosis not present

## 2014-10-29 DIAGNOSIS — E871 Hypo-osmolality and hyponatremia: Secondary | ICD-10-CM | POA: Diagnosis not present

## 2014-10-29 DIAGNOSIS — Z9841 Cataract extraction status, right eye: Secondary | ICD-10-CM | POA: Diagnosis not present

## 2014-10-29 DIAGNOSIS — Z79899 Other long term (current) drug therapy: Secondary | ICD-10-CM | POA: Diagnosis not present

## 2014-10-29 DIAGNOSIS — Z823 Family history of stroke: Secondary | ICD-10-CM | POA: Diagnosis not present

## 2014-10-29 DIAGNOSIS — E785 Hyperlipidemia, unspecified: Secondary | ICD-10-CM | POA: Diagnosis present

## 2014-10-29 DIAGNOSIS — K828 Other specified diseases of gallbladder: Secondary | ICD-10-CM | POA: Diagnosis present

## 2014-10-29 DIAGNOSIS — K8000 Calculus of gallbladder with acute cholecystitis without obstruction: Secondary | ICD-10-CM | POA: Diagnosis present

## 2014-10-29 DIAGNOSIS — K2270 Barrett's esophagus without dysplasia: Secondary | ICD-10-CM | POA: Diagnosis present

## 2014-10-29 DIAGNOSIS — R188 Other ascites: Secondary | ICD-10-CM | POA: Diagnosis present

## 2014-10-29 DIAGNOSIS — Z885 Allergy status to narcotic agent status: Secondary | ICD-10-CM | POA: Diagnosis not present

## 2014-10-29 LAB — COMPREHENSIVE METABOLIC PANEL
ALT: 60 U/L — ABNORMAL HIGH (ref 0–35)
AST: 37 U/L (ref 0–37)
Albumin: 2.7 g/dL — ABNORMAL LOW (ref 3.5–5.2)
Alkaline Phosphatase: 54 U/L (ref 39–117)
Anion gap: 12 (ref 5–15)
BUN: 5 mg/dL — ABNORMAL LOW (ref 6–23)
CO2: 21 mEq/L (ref 19–32)
Calcium: 8.5 mg/dL (ref 8.4–10.5)
Chloride: 96 mEq/L (ref 96–112)
Creatinine, Ser: 0.48 mg/dL — ABNORMAL LOW (ref 0.50–1.10)
GFR calc Af Amer: >90 mL/min (ref >90)
GFR calc non Af Amer: >90 mL/min (ref >90)
Glucose, Bld: 135 mg/dL — ABNORMAL HIGH (ref 70–99)
Potassium: 4.4 mEq/L (ref 3.7–5.3)
Sodium: 129 mEq/L — ABNORMAL LOW (ref 137–147)
Total Bilirubin: 0.4 mg/dL (ref 0.3–1.2)
Total Protein: 6.3 g/dL (ref 6.0–8.3)

## 2014-10-29 LAB — CBC
HCT: 32.5 % — ABNORMAL LOW (ref 36.0–46.0)
Hemoglobin: 10.8 g/dL — ABNORMAL LOW (ref 12.0–15.0)
MCH: 30.1 pg (ref 26.0–34.0)
MCHC: 33.2 g/dL (ref 30.0–36.0)
MCV: 90.5 fL (ref 78.0–100.0)
Platelets: 262 10*3/uL (ref 150–400)
RBC: 3.59 MIL/uL — ABNORMAL LOW (ref 3.87–5.11)
RDW: 13 % (ref 11.5–15.5)
WBC: 14.9 10*3/uL — ABNORMAL HIGH (ref 4.0–10.5)

## 2014-10-29 MED ORDER — POTASSIUM CHLORIDE IN NACL 20-0.9 MEQ/L-% IV SOLN
INTRAVENOUS | Status: DC
  Administered 2014-10-29 – 2014-10-30 (×3): via INTRAVENOUS
  Administered 2014-10-31 – 2014-11-01 (×2): 100 mL via INTRAVENOUS
  Administered 2014-11-02: via INTRAVENOUS
  Filled 2014-10-29 (×11): qty 1000

## 2014-10-29 MED ORDER — IOHEXOL 300 MG/ML  SOLN
100.0000 mL | Freq: Once | INTRAMUSCULAR | Status: AC | PRN
  Administered 2014-10-29: 100 mL via INTRAVENOUS

## 2014-10-29 MED ORDER — PIPERACILLIN-TAZOBACTAM 3.375 G IVPB
3.3750 g | Freq: Three times a day (TID) | INTRAVENOUS | Status: DC
  Administered 2014-10-29 – 2014-11-03 (×15): 3.375 g via INTRAVENOUS
  Filled 2014-10-29 (×16): qty 50

## 2014-10-29 NOTE — Plan of Care (Signed)
Problem: Phase III Progression Outcomes Goal: Activity at appropriate level-compared to baseline (UP IN CHAIR FOR HEMODIALYSIS)  Outcome: Completed/Met Date Met:  10/29/14 Goal: Demonstrates TCDB, IS independently Outcome: Completed/Met Date Met:  10/29/14

## 2014-10-29 NOTE — Plan of Care (Signed)
Problem: Discharge Progression Outcomes Goal: Pain controlled with appropriate interventions Outcome: Completed/Met Date Met:  10/29/14 Goal: Tolerating diet Outcome: Completed/Met Date Met:  10/29/14 Goal: Tubes and drains discontinued if indicated Outcome: Not Applicable Date Met:  10/29/14 Goal: Staples/sutures removed Outcome: Not Applicable Date Met:  10/29/14 Goal: Steri-Strips applied Outcome: Not Applicable Date Met:  10/29/14     

## 2014-10-29 NOTE — Plan of Care (Signed)
Problem: Phase III Progression Outcomes Goal: Pain controlled on oral analgesia Outcome: Completed/Met Date Met:  10/29/14     

## 2014-10-29 NOTE — Progress Notes (Signed)
2 Days Post-Op lap chole Subjective: Feeling bloated.  No BM yet.  Pain controlled.  No flatus.  No nausea  Objective: Vital signs in last 24 hours: Temp:  [98 F (36.7 C)-98.4 F (36.9 C)] 98.4 F (36.9 C) (11/10 0503) Pulse Rate:  [70-95] 95 (11/10 0503) Resp:  [18-20] 20 (11/10 0503) BP: (132-158)/(64-91) 149/91 mmHg (11/10 0503) SpO2:  [96 %-99 %] 98 % (11/10 0503)   Intake/Output from previous day: 11/09 0701 - 11/10 0700 In: 3410 [P.O.:960; I.V.:2400; IV Piggyback:50] Out: 2300 [Urine:2300] Intake/Output this shift:    General appearance: alert and cooperative GI: normal findings: soft, appropriately tender, distended  Incision: no significant drainage  Lab Results:   Recent Labs  10/26/14 1540 10/29/14 0433  WBC 12.4* 14.9*  HGB 12.8 10.8*  HCT 37.2 32.5*  PLT 308 262   BMET  Recent Labs  10/26/14 1540 10/29/14 0433  NA 131* 129*  K 3.7 4.4  CL 90* 96  CO2 23 21  GLUCOSE 139* 135*  BUN 9 5*  CREATININE 0.50 0.48*  CALCIUM 9.8 8.5   PT/INR No results for input(s): LABPROT, INR in the last 72 hours. ABG No results for input(s): PHART, HCO3 in the last 72 hours.  Invalid input(s): PCO2, PO2  MEDS, Scheduled . amitriptyline  25 mg Oral QHS  . atorvastatin  20 mg Oral q1800  . benazepril  20 mg Oral Daily  . brimonidine  1 drop Right Eye Q12H   And  . timolol  1 drop Right Eye Q12H  . cefTRIAXone (ROCEPHIN)  IV  2 g Intravenous Q24H  . cholecalciferol  1,000 Units Oral Daily  . heparin  5,000 Units Subcutaneous 3 times per day  . lip balm  1 application Topical BID  . pantoprazole  40 mg Oral Daily  . polyethylene glycol  17 g Oral BID  . saccharomyces boulardii  250 mg Oral BID    Studies/Results: Dg Cholangiogram Operative  10/27/2014   CLINICAL DATA:  Intraoperative cholangiogram  EXAM: INTRAOPERATIVE CHOLANGIOGRAM  TECHNIQUE: Cholangiographic images from the C-arm fluoroscopic device were submitted for interpretation  post-operatively. Please see the procedural report for the amount of contrast and the fluoroscopy time utilized.  FLUOROSCOPY TIME:  41 seconds  COMPARISON:  None.  FINDINGS: Dilated common duct.  No filling defect/choledocholithiasis is seen.  Contrast opacifies the proximal duodenum.  Mild reflux of contrast into the intrahepatic ducts demonstrates no intrahepatic ductal dilatation.  IMPRESSION: Intraoperative cholangiogram, as above.   Electronically Signed   By: Julian Hy M.D.   On: 10/27/2014 12:07    Assessment: s/p Procedure(s): LAPAROSCOPIC CHOLECYSTECTOMY SINGLE SITE WITH INTRAOPERATIVE CHOLANGIOGRAM Patient Active Problem List   Diagnosis Date Noted  . Chest pain 10/26/2014  . Acute calculous cholecystitis 10/26/2014  . Barrett esophagus   . History of GI diverticular bleed   . Hypertension   . Dyspnea 01/18/2012  . Aortic insufficiency 01/18/2012  . ANEMIA, IRON DEFICIENCY 06/04/2010  . HYPERCHOLESTEROLEMIA 02/08/2008  . GERD 02/08/2008  . ARTHRITIS 02/08/2008  . HIATAL HERNIA 11/02/2007  . DIVERTICULOSIS OF COLON 04/07/2004    Doing well, seems to have an ileus  Plan: Cont diet as tolerated Ambulate.  Suppository if needed Elevated wbc: no signs of infection.  Will cont abx and recheck in AM     LOS: 3 days     .Rosario Adie, [facility], [place]   10/29/2014 9:12 AM

## 2014-10-29 NOTE — Progress Notes (Signed)
Discussed pt's ongoing nausea with her RN.  Will make NPO and get STAT abd ct, given her symptoms and elevated wbc.

## 2014-10-29 NOTE — Plan of Care (Signed)
Problem: Phase III Progression Outcomes Goal: Nasogastric tube discontinued Outcome: Not Applicable Date Met:  14/44/58

## 2014-10-29 NOTE — Plan of Care (Signed)
Problem: Phase III Progression Outcomes Goal: Voiding independently Outcome: Completed/Met Date Met:  10/29/14     

## 2014-10-29 NOTE — Progress Notes (Signed)
Called to room by patient family who is concerned about patient nausea increased distention and sleepiness.  Vital signs obtained on patient.  See flowsheet, IV Zofran given to patient.

## 2014-10-29 NOTE — Progress Notes (Addendum)
Dr Marcello Moores paged regarding patient family concerns.  See vital sign follow sheet.  Abdomen continues to be distended, pt with nausea, instructed patient to not eat or drink at this time.  Patient is alert oriented answering questions appropriately and following all commands.  Will address family concerns with Dr Marcello Moores at return page [phone] Spoke with Dr Marcello Moores about the above.  Orders received.  Discussed with patient and family.  Questions answered

## 2014-10-29 NOTE — Clinical Documentation Improvement (Signed)
Patient with Cholecystitis with Lap Chole, LOA and wedge Liver Biopsy performed.   Patient with abnormal lab value; Na+ 129    PIV of 0.9 NaCl and D5 1/2 NaCl infusion initiated  Please provide a diagnosis associated with the above clinical indicator and treatment provided and document findings in next progress note and discharge summary if applicable.  Thank You, Zoila Shutter ,RN Clinical Documentation Specialist:  [facility] [employer]

## 2014-10-29 NOTE — Progress Notes (Signed)
UR completed 

## 2014-10-29 NOTE — Progress Notes (Addendum)
Patient continues to be nauseated after Zofran instructed patient to not eat or drink.  Patient appears to be sleepy but able to answer all questions appropriately and follows all commands.  Will Terri Shah made aware no new orders at this time

## 2014-10-30 ENCOUNTER — Inpatient Hospital Stay (HOSPITAL_COMMUNITY): Payer: Medicare PPO

## 2014-10-30 ENCOUNTER — Encounter (HOSPITAL_COMMUNITY): Payer: Self-pay | Admitting: Radiology

## 2014-10-30 DIAGNOSIS — R188 Other ascites: Secondary | ICD-10-CM

## 2014-10-30 LAB — APTT: aPTT: 30 seconds (ref 24–37)

## 2014-10-30 LAB — COMPREHENSIVE METABOLIC PANEL
ALT: 41 U/L — ABNORMAL HIGH (ref 0–35)
AST: 18 U/L (ref 0–37)
Albumin: 2.5 g/dL — ABNORMAL LOW (ref 3.5–5.2)
Alkaline Phosphatase: 52 U/L (ref 39–117)
Anion gap: 12 (ref 5–15)
BUN: 8 mg/dL (ref 6–23)
CO2: 19 mEq/L (ref 19–32)
Calcium: 8.5 mg/dL (ref 8.4–10.5)
Chloride: 96 mEq/L (ref 96–112)
Creatinine, Ser: 0.43 mg/dL — ABNORMAL LOW (ref 0.50–1.10)
GFR calc Af Amer: >90 mL/min (ref >90)
GFR calc non Af Amer: >90 mL/min (ref >90)
Glucose, Bld: 180 mg/dL — ABNORMAL HIGH (ref 70–99)
Potassium: 4.7 mEq/L (ref 3.7–5.3)
Sodium: 127 mEq/L — ABNORMAL LOW (ref 137–147)
Total Bilirubin: 0.6 mg/dL (ref 0.3–1.2)
Total Protein: 6.3 g/dL (ref 6.0–8.3)

## 2014-10-30 LAB — CBC
HCT: 37.4 % (ref 36.0–46.0)
Hemoglobin: 12.6 g/dL (ref 12.0–15.0)
MCH: 30.1 pg (ref 26.0–34.0)
MCHC: 33.7 g/dL (ref 30.0–36.0)
MCV: 89.3 fL (ref 78.0–100.0)
Platelets: 325 10*3/uL (ref 150–400)
RBC: 4.19 MIL/uL (ref 3.87–5.11)
RDW: 13 % (ref 11.5–15.5)
WBC: 16.2 10*3/uL — ABNORMAL HIGH (ref 4.0–10.5)

## 2014-10-30 LAB — PROTIME-INR
INR: 1.11 (ref 0.00–1.49)
Prothrombin Time: 14.4 seconds (ref 11.6–15.2)

## 2014-10-30 MED ORDER — MIDAZOLAM HCL 2 MG/2ML IJ SOLN
INTRAMUSCULAR | Status: AC
  Filled 2014-10-30: qty 6

## 2014-10-30 MED ORDER — MIDAZOLAM HCL 2 MG/2ML IJ SOLN
INTRAMUSCULAR | Status: AC | PRN
  Administered 2014-10-30: 1 mg via INTRAVENOUS
  Administered 2014-10-30: 0.5 mg via INTRAVENOUS

## 2014-10-30 MED ORDER — FENTANYL CITRATE 0.05 MG/ML IJ SOLN
INTRAMUSCULAR | Status: AC | PRN
  Administered 2014-10-30 (×2): 25 ug via INTRAVENOUS

## 2014-10-30 MED ORDER — TIMOLOL MALEATE 0.5 % OP SOLN
1.0000 [drp] | Freq: Two times a day (BID) | OPHTHALMIC | Status: DC
  Administered 2014-10-30 – 2014-11-03 (×3): 1 [drp] via OPHTHALMIC

## 2014-10-30 MED ORDER — FENTANYL CITRATE 0.05 MG/ML IJ SOLN
INTRAMUSCULAR | Status: AC
  Filled 2014-10-30: qty 4

## 2014-10-30 NOTE — Plan of Care (Signed)
Problem: Discharge Progression Outcomes Goal: Hemodynamically stable Outcome: Not Progressing Hypertension.

## 2014-10-30 NOTE — Consult Note (Signed)
Reason for consult: perihepatic fluid drainage  Referring Physician(s): [facility]  History of Present Illness: Terri Shah is a 80 y.o. female ,s/p cholecystectomy/wedge liver biopsy 11/8, with worsening leukocytosis, nausea, abdominal distension/pain and CT showing ascites as well as GB fossa fluid collection concerning for infection/bile leak. Request now received from [facility]CS for CT guided aspiration/drainage of perihepatic or potentially GB fossa collection.  Past Medical History  Diagnosis Date  . Hypertension   . Anemia   . Hyperlipidemia   . Arthritis   . Diverticulosis   . Gastritis   . Hiatal hernia   . Barrett esophagus   . Cataract   . History of GI diverticular bleed   . Presbyesophagus   . Iron deficiency anemia   . Palpitations 04/02/2010    Qualifier: Diagnosis of  By: Angelena Form, MD, Harrell Gave    . ECTOPIC PREGNANCY 02/08/2008    Qualifier: Diagnosis of  By: Mat Carne    . Barrett's esophagus 11/02/2007    Qualifier: Diagnosis of  By: Mat Carne    . GASTRITIS 11/02/2007    Qualifier: Diagnosis of  By: Mat Carne      Past Surgical History  Procedure Laterality Date  . Cesarean section  1950-1960s    x 3  . Tubal ligation    . Facial cosmetic surgery    . Cataract extraction, bilateral    . Laparoscopic cholecystectomy single site with intraoperative cholangiogram N/A 10/27/2014    Procedure: LAPAROSCOPIC CHOLECYSTECTOMY SINGLE SITE WITH INTRAOPERATIVE CHOLANGIOGRAM;  Surgeon: Michael Boston, MD;  Location: [facility];  Service: General;  Laterality: N/A;    Allergies: Codeine  Medications: Prior to Admission medications   Medication Sig Start Date End Date Taking? Authorizing Provider  acetaminophen (TYLENOL) 500 MG tablet Take 500 mg by mouth every 6 (six) hours as needed. Patient used this medication for her pain.   Yes Historical Provider, MD  amitriptyline (ELAVIL) 25 MG tablet Take 25 mg by mouth at bedtime.    Yes Historical Provider, MD   atorvastatin (LIPITOR) 20 MG tablet Take 20 mg by mouth daily.     Yes Historical Provider, MD  benazepril (LOTENSIN) 20 MG tablet Take 20 mg by mouth daily.     Yes Historical Provider, MD  brimonidine-timolol (COMBIGAN) 0.2-0.5 % ophthalmic solution Place 1 drop into the right eye every 12 (twelve) hours.   Yes Historical Provider, MD  cholecalciferol (VITAMIN D) 1000 UNITS tablet Take 1,000 Units by mouth daily.     Yes Historical Provider, MD  hydrochlorothiazide (,MICROZIDE/HYDRODIURIL,) 12.5 MG capsule Take 12.5 mg by mouth daily. Take 1 tab every other day.   Yes Historical Provider, MD  omeprazole (PRILOSEC) 20 MG capsule Take 20 mg by mouth daily.    Yes Historical Provider, MD  Psyllium (METAMUCIL PO) Take 1 each by mouth at bedtime.   Yes Historical Provider, MD  traMADol (ULTRAM) 50 MG tablet Take 50 mg by mouth every 6 (six) hours as needed for moderate pain.    Yes Historical Provider, MD  denosumab (PROLIA) 60 MG/ML SOLN injection Inject 60 mg into the skin every 6 (six) months. Administer in upper arm, thigh, or abdomen    Historical Provider, MD    Family History  Problem Relation Age of Onset  . Heart disease Mother   . Heart disease Father   . Breast cancer Sister   . Colon cancer Neg Hx   . Diabetes Sister   . Kidney disease Sister   .  Stroke Mother     History   Social History  . Marital Status: Widowed    Spouse Name: N/A    Number of Children: 3  . Years of Education: N/A   Social History Main Topics  . Smoking status: Never Smoker   . Smokeless tobacco: Never Used  . Alcohol Use: Yes     Comment: occasional  . Drug Use: No  . Sexual Activity: None   Other Topics Concern  . None   Social History Narrative        Review of Systems  Constitutional: Positive for fatigue. Negative for fever and chills.  Respiratory: Negative for cough.        Mild dyspnea  Cardiovascular: Negative for chest pain.  Gastrointestinal: Positive for nausea,  abdominal pain, constipation and abdominal distention. Negative for vomiting.  Genitourinary: Negative for dysuria and hematuria.  Musculoskeletal: Negative for back pain.  Neurological: Negative for headaches.  Hematological: Does not bruise/bleed easily.    Vital Signs: BP 152/93 mmHg  Pulse 94  Temp(Src) 98.2 F (36.8 C) (Oral)  Resp 18  Ht 5\' 1"  (1.549 m)  Wt 126 lb (57.153 kg)  BMI 23.82 kg/m2  SpO2 98%  Physical Exam  Constitutional: She is oriented to person, place, and time. She appears well-developed and well-nourished.  Cardiovascular: Normal rate and regular rhythm.   Pulmonary/Chest: Effort normal.  Sl dim BS bases  Abdominal: Soft. She exhibits distension. There is tenderness.  Few BS  Musculoskeletal: Normal range of motion. She exhibits no edema.  Neurological: She is alert and oriented to person, place, and time.    Imaging: Dg Chest 2 View  10/26/2014   CLINICAL DATA:  Chest and abdomen pain for 5 hr. Some nausea. No injury.  EXAM: CHEST  2 VIEW  COMPARISON:  10/19/2009.  FINDINGS: Cardiac silhouette is normal in size. No mediastinal or hilar masses. Lungs are mildly hyperexpanded with minor scarring at the apices. No lung consolidation or edema. No pleural effusion or pneumothorax.  Bony thorax is demineralized but intact.  IMPRESSION: No acute cardiopulmonary disease.   Electronically Signed   By: Lajean Manes M.D.   On: 10/26/2014 16:09   Dg Cholangiogram Operative  10/27/2014   CLINICAL DATA:  Intraoperative cholangiogram  EXAM: INTRAOPERATIVE CHOLANGIOGRAM  TECHNIQUE: Cholangiographic images from the C-arm fluoroscopic device were submitted for interpretation post-operatively. Please see the procedural report for the amount of contrast and the fluoroscopy time utilized.  FLUOROSCOPY TIME:  41 seconds  COMPARISON:  None.  FINDINGS: Dilated common duct.  No filling defect/choledocholithiasis is seen.  Contrast opacifies the proximal duodenum.  Mild reflux of  contrast into the intrahepatic ducts demonstrates no intrahepatic ductal dilatation.  IMPRESSION: Intraoperative cholangiogram, as above.   Electronically Signed   By: Julian Hy M.D.   On: 10/27/2014 12:07   Ct Abdomen Pelvis W Contrast  10/29/2014   CLINICAL DATA:  80 year old hypertensive female presenting with abdominal pain and inability to pass gas. Nausea and vomiting. Cholecystectomy 10/27/2014. Subsequent encounter.  EXAM: CT ABDOMEN AND PELVIS WITH CONTRAST  TECHNIQUE: Multidetector CT imaging of the abdomen and pelvis was performed using the standard protocol following bolus administration of intravenous contrast.  CONTRAST:  117mL OMNIPAQUE IOHEXOL 300 MG/ML  SOLN  COMPARISON:  10/26/2014 CT.  FINDINGS: Post recent cholecystectomy. Fluid collection at the cholecystectomy site may represent expected fluid versus bile leak. Anterior abdominal wall incision healing by secondary intent.  Tiny amount of free intraperitoneal air may be related to  recent postoperative state.  Abnormal appearance of small bowel with predominately fluid filled dilated loops of small bowel without point of obstruction identified. No anterior abdominal wall bowel containing hernia identified. The small bowel obstruction may be related to adhesions. Mild to moderate amount of ascites.  Mesenteric vessels slightly to the right of midline without true twist.  Colonic interposition with omentum stretch towards right upper quadrant. Within the omentum, small area of inflammation new from the prior exam.  Scattered colonic diverticula.  Fluid-filled distal esophagus and proximal aspect of the stomach.  Bibasilar atelectasis with small pleural effusions.  Liver cysts once again noted. No worrisome splenic, pancreatic, renal or adrenal lesion.  Scoliosis lumbar spine with superimposed degenerative changes.  Heart size top-normal.  Atherosclerotic type changes of the abdominal aorta without aneurysmal dilation. Atherosclerotic  type changes left common iliac artery with very mild atherosclerotic type changes origin of the superior mesenteric artery and celiac artery.  IMPRESSION: Recent cholecystectomy. At the cholecystectomy site, fluid collection is noted. Although it is possible this is related to simple postoperative changes bile leak or infection cannot be excluded.  Small bowel obstructive pattern.  This may be related to adhesions.  Moderate amount of ascites.  Tiny matter free intraperitoneal air not unexpected this recently postop.  Bibasilar atelectasis and small pleural effusions.  Right colonic interposition of bowel with omentum stretched towards the right upper quadrant with minimal inflammation of the omentum new from the prior exam.  Sigmoid diverticula.  These results will be called to the ordering clinician or representative by the Radiologist Assistant, and communication documented in the PACS or zVision Dashboard.   Electronically Signed   By: Chauncey Cruel M.D.   On: 10/29/2014 15:45   Ct Abdomen Pelvis W Contrast  10/26/2014   CLINICAL DATA:  Nausea/ vomiting, abdominal pain, chest pain  EXAM: CT ABDOMEN AND PELVIS WITH CONTRAST  TECHNIQUE: Multidetector CT imaging of the abdomen and pelvis was performed using the standard protocol following bolus administration of intravenous contrast.  CONTRAST:  100 mL Omnipaque 300 IV  COMPARISON:  None.  FINDINGS: Lower chest:  Mild scarring/ atelectasis of the lung bases.  Contrast in the distal esophagus, suggesting gastroesophageal reflux or esophageal dysmotility.  Hepatobiliary: Liver is notable for a 2.7 cm cyst in the medial segment left hepatic lobe (series 2/ image 22) with and an additional 3.6 cm cyst in the caudate (series 2/image 16.  Gallbladder is notable for a 2.7 cm stone in the gallbladder neck with mild gallbladder wall thickening/edema (series 2/image 30), raising concern for acute cholecystitis.  Dilated common duct, measuring 10 mm (series 5/ image 40).  Distal CBD abruptly tapers at the ampulla, without definite common duct calculus visualized on CT.  Pancreas: Within normal limits.  Spleen: Within normal limits.  Adrenals/Urinary Tract: Adrenal glands are unremarkable.  Kidneys are within normal limits.  No hydronephrosis.  Bladder is unremarkable.  Stomach/Bowel: Stomach is unremarkable.  No evidence of bowel obstruction.  Normal appendix.  Extensive colonic diverticulosis, without associated inflammatory changes.  Vascular/Lymphatic: Atherosclerotic calcifications of the abdominal aorta and branch vessels.  No suspicious abdominopelvic lymphadenopathy.  Reproductive: Uterus is unremarkable.  Bilateral ovaries are grossly unremarkable.  Other: No abdominopelvic ascites.  Small fat containing right inguinal hernia.  Musculoskeletal: Degenerative changes of the visualized thoracolumbar spine with mild lumbar levoscoliosis.  IMPRESSION: Cholelithiasis with gallbladder wall thickening/ edema, concerning for acute cholecystitis.  Common duct is dilated, measuring 10 mm, and abruptly tapers at the ampulla. No definite  common duct calculus is evident on CT.   Electronically Signed   By: Julian Hy M.D.   On: 10/26/2014 18:23   Dg Abd Acute W/chest  10/26/2014   CLINICAL DATA:  Abdominal pain, nausea  EXAM: ACUTE ABDOMEN SERIES (ABDOMEN 2 VIEW & CHEST 1 VIEW)  COMPARISON:  None.  FINDINGS: Several mildly dilated loops of probable small bowel noted over the right upper quadrant, largest caliber 3.5 cm. Colon is normal in caliber. No free air beneath the diaphragm allowing for bowel interposition over the liver. No abnormal radiopacity.  IMPRESSION: Several mildly dilated loops of small bowel over the right upper quadrant which could indicate early small bowel obstruction or focal ileus.   Electronically Signed   By: Conchita Paris M.D.   On: 10/26/2014 16:11    Labs:  CBC:  Recent Labs  09/18/14 1052 10/26/14 1540 10/29/14 0433 10/30/14 0420  WBC  5.4 12.4* 14.9* 16.2*  HGB 12.1 12.8 10.8* 12.6  HCT 36.1 37.2 32.5* 37.4  PLT 292.0 308 262 325    COAGS:  Recent Labs  10/30/14 0420  INR 1.11  APTT 30    BMP:  Recent Labs  10/26/14 1540 10/29/14 0433 10/30/14 0420  NA 131* 129* 127*  K 3.7 4.4 4.7  CL 90* 96 96  CO2 23 21 19   GLUCOSE 139* 135* 180*  BUN 9 5* 8  CALCIUM 9.8 8.5 8.5  CREATININE 0.50 0.48* 0.43*  GFRNONAA >90 >90 >90  GFRAA >90 >90 >90    LIVER FUNCTION TESTS:  Recent Labs  10/26/14 1540 10/27/14 0519 10/29/14 0433 10/30/14 0420  BILITOT 0.6 0.6 0.4 0.6  AST 17 20 37 18  ALT 9 11 60* 41*  ALKPHOS 62 57 54 52  PROT 7.4 7.0 6.3 6.3  ALBUMIN 4.0 3.5 2.7* 2.5*    TUMOR MARKERS: No results for input(s): AFPTM, CEA, CA199, CHROMGRNA in the last 8760 hours.  Assessment and Plan: Terri Shah is a 80 y.o. female ,s/p cholecystectomy/wedge liver biopsy 11/8, with worsening leukocytosis, nausea, abdominal distension/pain and CT showing ascites as well as GB fossa fluid collection concerning for infection/bile leak. Request now received from [facility] for CT guided aspiration/drainage of perihepatic or potentially GB fossa fluid collection. Imaging studies were reviewed by Dr. Barbie Banner. GB fossa fluid collection would be difficult to access percutaneously due to overlying bowel; will repeat CT and most likely aspirate/possibly drain perihepatic collection. Details/risks of above d/w pt/son with their understanding and consent. Dr. Marcello Moores ([facility]) also aware.         I spent a total of 20 minutes face to face in clinical consultation, greater than 50% of which was counseling/coordinating care for CT guided drainage of abdominal fluid collection.  Signed: Autumn Messing 10/30/2014, 11:11 AM

## 2014-10-30 NOTE — Plan of Care (Signed)
Problem: Phase II Progression Outcomes Goal: Return of bowel function (flatus, BM) IF ABDOMINAL SURGERY:  Outcome: Not Progressing Negative flatus. Distended abdomen. CT revealed ascites.  Fluid to be removed today.  Problem: Discharge Progression Outcomes Goal: Barriers To Progression Addressed/Resolved Outcome: Progressing Ascites identified. Pt at CT now for removal.

## 2014-10-30 NOTE — Procedures (Signed)
RUQ drain Dark fluid No comp

## 2014-10-30 NOTE — Plan of Care (Signed)
Problem: Discharge Progression Outcomes Goal: Barriers To Progression Addressed/Resolved Outcome: Progressing Straight drain inserted and draining.

## 2014-10-30 NOTE — Progress Notes (Signed)
Acute calculous cholecystitis S/p LAPAROSCOPIC CHOLECYSTECTOMY SINGLE SITE WITH INTRAOPERATIVE CHOLANGIOGRAM,LAPAROSCOPIC LYSIS OF ADHESIONS WEDGE LIVER BIOPSY, Dr. Michael Boston, 10/27/14.   Hyponatremia  GERD  Chest pain  Barrett esophagus  History of GI diverticular bleed  Hypertension Chronic right shoulder pain

## 2014-10-30 NOTE — Progress Notes (Signed)
3 Days Post-Op  Subjective: She feels bad, nausea, and dry heaves last PM I don't think she has the dry heaves this AM, and this may account for her BP.  WBC is up, she is on zosyn.  Waiting on IR.  Objective: Vital signs in last 24 hours: Temp:  [97.4 F (36.3 C)-98.9 F (37.2 C)] 97.4 F (36.3 C) (11/11 0536) Pulse Rate:  [77-104] 77 (11/11 0536) Resp:  [18-20] 18 (11/11 0536) BP: (137-180)/(79-107) 164/107 mmHg (11/11 0536) SpO2:  [94 %-97 %] 96 % (11/11 0536) Last BM Date: 10/26/14 Hypertensive  DBP up in 100 range Na 127 WBC 16.2 Glucose up Intake/Output from previous day: 11/10 0701 - 11/11 0700 In: 610 [P.O.:240; I.V.:320; IV Piggyback:50] Out: 950 [Urine:950] Intake/Output this shift: Total I/O In: 0  Out: 300 [Urine:300]  General appearance: alert and cooperative Resp: clear to auscultation bilaterally GI: soft, she is a little distended few BS, no flatus or BM.  Port site looks good.  Lab Results:   Recent Labs  10/29/14 0433 10/30/14 0420  WBC 14.9* 16.2*  HGB 10.8* 12.6  HCT 32.5* 37.4  PLT 262 325    BMET  Recent Labs  10/29/14 0433 10/30/14 0420  NA 129* 127*  K 4.4 4.7  CL 96 96  CO2 21 19  GLUCOSE 135* 180*  BUN 5* 8  CREATININE 0.48* 0.43*  CALCIUM 8.5 8.5   PT/INR  Recent Labs  10/30/14 0420  LABPROT 14.4  INR 1.11     Recent Labs Lab 10/26/14 1540 10/27/14 0519 10/29/14 0433 10/30/14 0420  AST 17 20 37 18  ALT 9 11 60* 41*  ALKPHOS 62 57 54 52  BILITOT 0.6 0.6 0.4 0.6  PROT 7.4 7.0 6.3 6.3  ALBUMIN 4.0 3.5 2.7* 2.5*     Lipase     Component Value Date/Time   LIPASE 29 10/26/2014 1540     Studies/Results: Ct Abdomen Pelvis W Contrast  10/29/2014   CLINICAL DATA:  80 year old hypertensive female presenting with abdominal pain and inability to pass gas. Nausea and vomiting. Cholecystectomy 10/27/2014. Subsequent encounter.  EXAM: CT ABDOMEN AND PELVIS WITH CONTRAST  TECHNIQUE: Multidetector CT imaging of the  abdomen and pelvis was performed using the standard protocol following bolus administration of intravenous contrast.  CONTRAST:  128mL OMNIPAQUE IOHEXOL 300 MG/ML  SOLN  COMPARISON:  10/26/2014 CT.  FINDINGS: Post recent cholecystectomy. Fluid collection at the cholecystectomy site may represent expected fluid versus bile leak. Anterior abdominal wall incision healing by secondary intent.  Tiny amount of free intraperitoneal air may be related to recent postoperative state.  Abnormal appearance of small bowel with predominately fluid filled dilated loops of small bowel without point of obstruction identified. No anterior abdominal wall bowel containing hernia identified. The small bowel obstruction may be related to adhesions. Mild to moderate amount of ascites.  Mesenteric vessels slightly to the right of midline without true twist.  Colonic interposition with omentum stretch towards right upper quadrant. Within the omentum, small area of inflammation new from the prior exam.  Scattered colonic diverticula.  Fluid-filled distal esophagus and proximal aspect of the stomach.  Bibasilar atelectasis with small pleural effusions.  Liver cysts once again noted. No worrisome splenic, pancreatic, renal or adrenal lesion.  Scoliosis lumbar spine with superimposed degenerative changes.  Heart size top-normal.  Atherosclerotic type changes of the abdominal aorta without aneurysmal dilation. Atherosclerotic type changes left common iliac artery with very mild atherosclerotic type changes origin of the superior mesenteric  artery and celiac artery.  IMPRESSION: Recent cholecystectomy. At the cholecystectomy site, fluid collection is noted. Although it is possible this is related to simple postoperative changes bile leak or infection cannot be excluded.  Small bowel obstructive pattern.  This may be related to adhesions.  Moderate amount of ascites.  Tiny matter free intraperitoneal air not unexpected this recently postop.   Bibasilar atelectasis and small pleural effusions.  Right colonic interposition of bowel with omentum stretched towards the right upper quadrant with minimal inflammation of the omentum new from the prior exam.  Sigmoid diverticula.  These results will be called to the ordering clinician or representative by the Radiologist Assistant, and communication documented in the PACS or zVision Dashboard.   Electronically Signed   By: Chauncey Cruel M.D.   On: 10/29/2014 15:45    Medications: . amitriptyline  25 mg Oral QHS  . atorvastatin  20 mg Oral q1800  . benazepril  20 mg Oral Daily  . brimonidine  1 drop Right Eye Q12H   And  . timolol  1 drop Right Eye Q12H  . cholecalciferol  1,000 Units Oral Daily  . heparin  5,000 Units Subcutaneous 3 times per day  . lip balm  1 application Topical BID  . pantoprazole  40 mg Oral Daily  . piperacillin-tazobactam (ZOSYN)  IV  3.375 g Intravenous Q8H  . polyethylene glycol  17 g Oral BID  . saccharomyces boulardii  250 mg Oral BID   . 0.9 % NaCl with KCl 20 mEq / L 100 mL/hr at 10/29/14 1344   Prior to Admission medications   Medication Sig Start Date End Date Taking? Authorizing Provider  acetaminophen (TYLENOL) 500 MG tablet Take 500 mg by mouth every 6 (six) hours as needed. Patient used this medication for her pain.   Yes Historical Provider, MD  amitriptyline (ELAVIL) 25 MG tablet Take 25 mg by mouth at bedtime.    Yes Historical Provider, MD  atorvastatin (LIPITOR) 20 MG tablet Take 20 mg by mouth daily.     Yes Historical Provider, MD  benazepril (LOTENSIN) 20 MG tablet Take 20 mg by mouth daily.     Yes Historical Provider, MD  brimonidine-timolol (COMBIGAN) 0.2-0.5 % ophthalmic solution Place 1 drop into the right eye every 12 (twelve) hours.   Yes Historical Provider, MD  cholecalciferol (VITAMIN D) 1000 UNITS tablet Take 1,000 Units by mouth daily.     Yes Historical Provider, MD  hydrochlorothiazide (,MICROZIDE/HYDRODIURIL,) 12.5 MG capsule  Take 12.5 mg by mouth daily. Take 1 tab every other day.   Yes Historical Provider, MD  omeprazole (PRILOSEC) 20 MG capsule Take 20 mg by mouth daily.    Yes Historical Provider, MD  Psyllium (METAMUCIL PO) Take 1 each by mouth at bedtime.   Yes Historical Provider, MD  traMADol (ULTRAM) 50 MG tablet Take 50 mg by mouth every 6 (six) hours as needed for moderate pain.    Yes Historical Provider, MD  denosumab (PROLIA) 60 MG/ML SOLN injection Inject 60 mg into the skin every 6 (six) months. Administer in upper arm, thigh, or abdomen    Historical Provider, MD     Assessment/Plan Acute calculous cholecystitis S/p LAPAROSCOPIC CHOLECYSTECTOMY SINGLE SITE WITH INTRAOPERATIVE CHOLANGIOGRAM,LAPAROSCOPIC LYSIS OF ADHESIONS WEDGE LIVER BIOPSY, Dr. Michael Boston, 10/27/14.   GERD  Chest pain  Barrett esophagus  History of GI diverticular bleed  Hypertension  Chronic right shoulder pain  Plan:  IV fluids, IR, antibiotics and treat for nausea.  LOS: 4 days    Terri Shah 10/30/2014

## 2014-10-30 NOTE — Plan of Care (Signed)
Problem: Phase II Progression Outcomes Goal: Pain controlled Outcome: Progressing Goal: Progress activity as tolerated unless otherwise ordered Outcome: Progressing Goal: Progressing with IS, TCDB Outcome: Progressing Goal: Vital signs stable Outcome: Progressing Goal: Return of bowel function (flatus, BM) IF ABDOMINAL SURGERY:  Outcome: Progressing Goal: Other Phase II Outcomes/Goals Outcome: Not Progressing  Problem: Phase III Progression Outcomes Goal: Pain controlled on oral analgesia Outcome: Progressing Goal: IV changed to normal saline lock Outcome: Progressing Goal: Discharge plan remains appropriate-arrangements made Outcome: Progressing Goal: Other Phase III Outcomes/Goals Outcome: Not Applicable Date Met:  87/19/59

## 2014-10-31 ENCOUNTER — Inpatient Hospital Stay (HOSPITAL_COMMUNITY): Payer: Medicare PPO

## 2014-10-31 DIAGNOSIS — K8000 Calculus of gallbladder with acute cholecystitis without obstruction: Principal | ICD-10-CM

## 2014-10-31 DIAGNOSIS — E871 Hypo-osmolality and hyponatremia: Secondary | ICD-10-CM

## 2014-10-31 LAB — COMPREHENSIVE METABOLIC PANEL
ALT: 25 U/L (ref 0–35)
AST: 17 U/L (ref 0–37)
Albumin: 2.2 g/dL — ABNORMAL LOW (ref 3.5–5.2)
Alkaline Phosphatase: 36 U/L — ABNORMAL LOW (ref 39–117)
Anion gap: 11 (ref 5–15)
BUN: 9 mg/dL (ref 6–23)
CO2: 21 mEq/L (ref 19–32)
Calcium: 7.6 mg/dL — ABNORMAL LOW (ref 8.4–10.5)
Chloride: 96 mEq/L (ref 96–112)
Creatinine, Ser: 0.48 mg/dL — ABNORMAL LOW (ref 0.50–1.10)
GFR calc Af Amer: >90 mL/min (ref >90)
GFR calc non Af Amer: >90 mL/min (ref >90)
Glucose, Bld: 101 mg/dL — ABNORMAL HIGH (ref 70–99)
Potassium: 4.4 mEq/L (ref 3.7–5.3)
Sodium: 128 mEq/L — ABNORMAL LOW (ref 137–147)
Total Bilirubin: 0.7 mg/dL (ref 0.3–1.2)
Total Protein: 5.5 g/dL — ABNORMAL LOW (ref 6.0–8.3)

## 2014-10-31 LAB — BASIC METABOLIC PANEL
Anion gap: 13 (ref 5–15)
BUN: 8 mg/dL (ref 6–23)
CO2: 21 mEq/L (ref 19–32)
Calcium: 7.7 mg/dL — ABNORMAL LOW (ref 8.4–10.5)
Chloride: 99 mEq/L (ref 96–112)
Creatinine, Ser: 0.49 mg/dL — ABNORMAL LOW (ref 0.50–1.10)
GFR calc Af Amer: >90 mL/min (ref >90)
GFR calc non Af Amer: >90 mL/min (ref >90)
Glucose, Bld: 99 mg/dL (ref 70–99)
Potassium: 4.2 mEq/L (ref 3.7–5.3)
Sodium: 133 mEq/L — ABNORMAL LOW (ref 137–147)

## 2014-10-31 LAB — CBC
HCT: 34.5 % — ABNORMAL LOW (ref 36.0–46.0)
Hemoglobin: 11.3 g/dL — ABNORMAL LOW (ref 12.0–15.0)
MCH: 29.7 pg (ref 26.0–34.0)
MCHC: 32.8 g/dL (ref 30.0–36.0)
MCV: 90.8 fL (ref 78.0–100.0)
Platelets: 314 10*3/uL (ref 150–400)
RBC: 3.8 MIL/uL — ABNORMAL LOW (ref 3.87–5.11)
RDW: 13.1 % (ref 11.5–15.5)
WBC: 12 10*3/uL — ABNORMAL HIGH (ref 4.0–10.5)

## 2014-10-31 MED ORDER — SODIUM CHLORIDE 1 G PO TABS
1.0000 g | ORAL_TABLET | Freq: Once | ORAL | Status: AC
Start: 2014-10-31 — End: 2014-10-31
  Administered 2014-10-31: 1 g via ORAL
  Filled 2014-10-31: qty 1

## 2014-10-31 NOTE — Progress Notes (Signed)
Pt c/o when she swallows it 'feels like the food goes down and stops.' Denies more than mild nausea one time earlier today but has eaten very little. Abd has increased distention & tenderness both on palpation and rebound. Bowel sounds hypoactive except tympanic in RUQ. Page placed to PA through [place] office. Terrence Wishon, [employer]

## 2014-10-31 NOTE — Progress Notes (Signed)
Subjective: Pt feels a little better since drain placement yesterday No new c/o. Going to try some po liquids today  Objective: Physical Exam: BP 151/69 mmHg  Pulse 80  Temp(Src) 97.4 F (36.3 C) (Oral)  Resp 16  Ht 5\' 1"  (1.549 m)  Wt 126 lb (57.153 kg)  BMI 23.82 kg/m2  SpO2 96% Drain intact, site clean, minimally tender Output ~10cc fluid   Labs: CBC  Recent Labs  10/30/14 0420 10/31/14 0458  WBC 16.2* 12.0*  HGB 12.6 11.3*  HCT 37.4 34.5*  PLT 325 314   BMET  Recent Labs  10/30/14 0420 10/31/14 0458  NA 127* 128*  K 4.7 4.4  CL 96 96  CO2 19 21  GLUCOSE 180* 101*  BUN 8 9  CREATININE 0.43* 0.48*  CALCIUM 8.5 7.6*   LFT  Recent Labs  10/31/14 0458  PROT 5.5*  ALBUMIN 2.2*  AST 17  ALT 25  ALKPHOS 36*  BILITOT 0.7   PT/INR  Recent Labs  10/30/14 0420  LABPROT 14.4  INR 1.11     Studies/Results: Ct Abdomen Pelvis W Contrast  10/29/2014   CLINICAL DATA:  80 year old hypertensive female presenting with abdominal pain and inability to pass gas. Nausea and vomiting. Cholecystectomy 10/27/2014. Subsequent encounter.  EXAM: CT ABDOMEN AND PELVIS WITH CONTRAST  TECHNIQUE: Multidetector CT imaging of the abdomen and pelvis was performed using the standard protocol following bolus administration of intravenous contrast.  CONTRAST:  114mL OMNIPAQUE IOHEXOL 300 MG/ML  SOLN  COMPARISON:  10/26/2014 CT.  FINDINGS: Post recent cholecystectomy. Fluid collection at the cholecystectomy site may represent expected fluid versus bile leak. Anterior abdominal wall incision healing by secondary intent.  Tiny amount of free intraperitoneal air may be related to recent postoperative state.  Abnormal appearance of small bowel with predominately fluid filled dilated loops of small bowel without point of obstruction identified. No anterior abdominal wall bowel containing hernia identified. The small bowel obstruction may be related to adhesions. Mild to moderate amount  of ascites.  Mesenteric vessels slightly to the right of midline without true twist.  Colonic interposition with omentum stretch towards right upper quadrant. Within the omentum, small area of inflammation new from the prior exam.  Scattered colonic diverticula.  Fluid-filled distal esophagus and proximal aspect of the stomach.  Bibasilar atelectasis with small pleural effusions.  Liver cysts once again noted. No worrisome splenic, pancreatic, renal or adrenal lesion.  Scoliosis lumbar spine with superimposed degenerative changes.  Heart size top-normal.  Atherosclerotic type changes of the abdominal aorta without aneurysmal dilation. Atherosclerotic type changes left common iliac artery with very mild atherosclerotic type changes origin of the superior mesenteric artery and celiac artery.  IMPRESSION: Recent cholecystectomy. At the cholecystectomy site, fluid collection is noted. Although it is possible this is related to simple postoperative changes bile leak or infection cannot be excluded.  Small bowel obstructive pattern.  This may be related to adhesions.  Moderate amount of ascites.  Tiny matter free intraperitoneal air not unexpected this recently postop.  Bibasilar atelectasis and small pleural effusions.  Right colonic interposition of bowel with omentum stretched towards the right upper quadrant with minimal inflammation of the omentum new from the prior exam.  Sigmoid diverticula.  These results will be called to the ordering clinician or representative by the Radiologist Assistant, and communication documented in the PACS or zVision Dashboard.   Electronically Signed   By: Chauncey Cruel M.D.   On: 10/29/2014 15:45   Ct Image Guided Drainage  By Percutaneous Catheter  10/30/2014   CLINICAL DATA:  Peritoneal fluid  EXAM: CT IMAGE GUIDED DRAINAGE BY PERCUTANEOUS CATHETER  FLUOROSCOPY TIME:  None  MEDICATIONS AND MEDICAL HISTORY: Versed 1.5 mg, Fentanyl 50 mcg.  Additional Medications: None.   ANESTHESIA/SEDATION: Moderate sedation time: 15 minutes  CONTRAST:  None  PROCEDURE: The procedure, risks, benefits, and alternatives were explained to the patient. Questions regarding the procedure were encouraged and answered. The patient understands and consents to the procedure.  The right upper quadrant was prepped with Betadine in a sterile fashion, and a sterile drape was applied covering the operative field. A sterile gown and sterile gloves were used for the procedure.  Under CT guidance, an 18 gauge needle was inserted into the peritoneal perihepatic fluid collection and removed over an Amplatz. A 12 French dilator followed by a 12 French abscess strain were inserted and looped and string fixed.  FINDINGS: Images document needle placement in the peritoneal fluid about the liver.  COMPLICATIONS: None  IMPRESSION: Successful peritoneal fluid drain about the liver.   Electronically Signed   By: Maryclare Bean M.D.   On: 10/30/2014 15:55    Assessment/Plan: S/p perc drain to RUQ fluid collection post lap chole Await Cs WBC down. IR following   LOS: 5 days    Ascencion Dike PA-C 10/31/2014 1:15 PM

## 2014-10-31 NOTE — Plan of Care (Signed)
Problem: Phase II Progression Outcomes Goal: Pain controlled Outcome: Completed/Met Date Met:  10/31/14 Goal: Progress activity as tolerated unless otherwise ordered Outcome: Completed/Met Date Met:  10/31/14 Goal: Progressing with IS, TCDB Outcome: Completed/Met Date Met:  10/31/14 Goal: Vital signs stable Outcome: Completed/Met Date Met:  10/31/14 Goal: Return of bowel function (flatus, BM) IF ABDOMINAL SURGERY:  Outcome: Progressing Goal: Other Phase II Outcomes/Goals Outcome: Not Applicable Date Met:  12/87/86

## 2014-10-31 NOTE — Progress Notes (Signed)
4 Days Post-Op lap chole Subjective: She feels better today.  No flatus or BM's. Has some pain around the drain.   Objective: Vital signs in last 24 hours: Temp:  [97.4 F (36.3 C)-98.2 F (36.8 C)] 97.4 F (36.3 C) (11/12 0600) Pulse Rate:  [80-102] 80 (11/12 0600) Resp:  [16-20] 16 (11/12 0600) BP: (131-189)/(69-93) 151/69 mmHg (11/12 0600) SpO2:  [96 %-100 %] 96 % (11/12 0600) Last BM Date: 10/26/14  Intake/Output from previous day: 11/11 0701 - 11/12 0700 In: 4390 [P.O.:480; I.V.:3800; IV Piggyback:100] Out: 1835 [Urine:1050; Drains:785] Intake/Output this shift:    General appearance: alert and cooperative Resp: clear to auscultation bilaterally GI: soft, she is a little distended, Port site looks good.  Lab Results:   Recent Labs  10/30/14 0420 10/31/14 0458  WBC 16.2* 12.0*  HGB 12.6 11.3*  HCT 37.4 34.5*  PLT 325 314    BMET  Recent Labs  10/30/14 0420 10/31/14 0458  NA 127* 128*  K 4.7 4.4  CL 96 96  CO2 19 21  GLUCOSE 180* 101*  BUN 8 9  CREATININE 0.43* 0.48*  CALCIUM 8.5 7.6*   PT/INR  Recent Labs  10/30/14 0420  LABPROT 14.4  INR 1.11     Recent Labs Lab 10/26/14 1540 10/27/14 0519 10/29/14 0433 10/30/14 0420 10/31/14 0458  AST 17 20 37 18 17  ALT 9 11 60* 41* 25  ALKPHOS 62 57 54 52 36*  BILITOT 0.6 0.6 0.4 0.6 0.7  PROT 7.4 7.0 6.3 6.3 5.5*  ALBUMIN 4.0 3.5 2.7* 2.5* 2.2*     Lipase     Component Value Date/Time   LIPASE 29 10/26/2014 1540     Studies/Results: Ct Abdomen Pelvis W Contrast  10/29/2014   CLINICAL DATA:  80 year old hypertensive female presenting with abdominal pain and inability to pass gas. Nausea and vomiting. Cholecystectomy 10/27/2014. Subsequent encounter.  EXAM: CT ABDOMEN AND PELVIS WITH CONTRAST  TECHNIQUE: Multidetector CT imaging of the abdomen and pelvis was performed using the standard protocol following bolus administration of intravenous contrast.  CONTRAST:  170mL OMNIPAQUE IOHEXOL 300  MG/ML  SOLN  COMPARISON:  10/26/2014 CT.  FINDINGS: Post recent cholecystectomy. Fluid collection at the cholecystectomy site may represent expected fluid versus bile leak. Anterior abdominal wall incision healing by secondary intent.  Tiny amount of free intraperitoneal air may be related to recent postoperative state.  Abnormal appearance of small bowel with predominately fluid filled dilated loops of small bowel without point of obstruction identified. No anterior abdominal wall bowel containing hernia identified. The small bowel obstruction may be related to adhesions. Mild to moderate amount of ascites.  Mesenteric vessels slightly to the right of midline without true twist.  Colonic interposition with omentum stretch towards right upper quadrant. Within the omentum, small area of inflammation new from the prior exam.  Scattered colonic diverticula.  Fluid-filled distal esophagus and proximal aspect of the stomach.  Bibasilar atelectasis with small pleural effusions.  Liver cysts once again noted. No worrisome splenic, pancreatic, renal or adrenal lesion.  Scoliosis lumbar spine with superimposed degenerative changes.  Heart size top-normal.  Atherosclerotic type changes of the abdominal aorta without aneurysmal dilation. Atherosclerotic type changes left common iliac artery with very mild atherosclerotic type changes origin of the superior mesenteric artery and celiac artery.  IMPRESSION: Recent cholecystectomy. At the cholecystectomy site, fluid collection is noted. Although it is possible this is related to simple postoperative changes bile leak or infection cannot be excluded.  Small bowel  obstructive pattern.  This may be related to adhesions.  Moderate amount of ascites.  Tiny matter free intraperitoneal air not unexpected this recently postop.  Bibasilar atelectasis and small pleural effusions.  Right colonic interposition of bowel with omentum stretched towards the right upper quadrant with minimal  inflammation of the omentum new from the prior exam.  Sigmoid diverticula.  These results will be called to the ordering clinician or representative by the Radiologist Assistant, and communication documented in the PACS or zVision Dashboard.   Electronically Signed   By: Chauncey Cruel M.D.   On: 10/29/2014 15:45   Ct Image Guided Drainage By Percutaneous Catheter  10/30/2014   CLINICAL DATA:  Peritoneal fluid  EXAM: CT IMAGE GUIDED DRAINAGE BY PERCUTANEOUS CATHETER  FLUOROSCOPY TIME:  None  MEDICATIONS AND MEDICAL HISTORY: Versed 1.5 mg, Fentanyl 50 mcg.  Additional Medications: None.  ANESTHESIA/SEDATION: Moderate sedation time: 15 minutes  CONTRAST:  None  PROCEDURE: The procedure, risks, benefits, and alternatives were explained to the patient. Questions regarding the procedure were encouraged and answered. The patient understands and consents to the procedure.  The right upper quadrant was prepped with Betadine in a sterile fashion, and a sterile drape was applied covering the operative field. A sterile gown and sterile gloves were used for the procedure.  Under CT guidance, an 18 gauge needle was inserted into the peritoneal perihepatic fluid collection and removed over an Amplatz. A 12 French dilator followed by a 12 French abscess strain were inserted and looped and string fixed.  FINDINGS: Images document needle placement in the peritoneal fluid about the liver.  COMPLICATIONS: None  IMPRESSION: Successful peritoneal fluid drain about the liver.   Electronically Signed   By: Maryclare Bean M.D.   On: 10/30/2014 15:55    Medications: . amitriptyline  25 mg Oral QHS  . atorvastatin  20 mg Oral q1800  . benazepril  20 mg Oral Daily  . brimonidine  1 drop Right Eye Q12H  . cholecalciferol  1,000 Units Oral Daily  . heparin  5,000 Units Subcutaneous 3 times per day  . lip balm  1 application Topical BID  . pantoprazole  40 mg Oral Daily  . piperacillin-tazobactam (ZOSYN)  IV  3.375 g Intravenous Q8H  .  polyethylene glycol  17 g Oral BID  . saccharomyces boulardii  250 mg Oral BID  . sodium chloride  1 g Oral Once  . timolol  1 drop Right Eye Q12H   . 0.9 % NaCl with KCl 20 mEq / L 100 mL/hr at 10/30/14 2246   Prior to Admission medications   Medication Sig Start Date End Date Taking? Authorizing Provider  acetaminophen (TYLENOL) 500 MG tablet Take 500 mg by mouth every 6 (six) hours as needed. Patient used this medication for her pain.   Yes Historical Provider, MD  amitriptyline (ELAVIL) 25 MG tablet Take 25 mg by mouth at bedtime.    Yes Historical Provider, MD  atorvastatin (LIPITOR) 20 MG tablet Take 20 mg by mouth daily.     Yes Historical Provider, MD  benazepril (LOTENSIN) 20 MG tablet Take 20 mg by mouth daily.     Yes Historical Provider, MD  brimonidine-timolol (COMBIGAN) 0.2-0.5 % ophthalmic solution Place 1 drop into the right eye every 12 (twelve) hours.   Yes Historical Provider, MD  cholecalciferol (VITAMIN D) 1000 UNITS tablet Take 1,000 Units by mouth daily.     Yes Historical Provider, MD  hydrochlorothiazide (,MICROZIDE/HYDRODIURIL,) 12.5 MG capsule Take 12.5  mg by mouth daily. Take 1 tab every other day.   Yes Historical Provider, MD  omeprazole (PRILOSEC) 20 MG capsule Take 20 mg by mouth daily.    Yes Historical Provider, MD  Psyllium (METAMUCIL PO) Take 1 each by mouth at bedtime.   Yes Historical Provider, MD  traMADol (ULTRAM) 50 MG tablet Take 50 mg by mouth every 6 (six) hours as needed for moderate pain.    Yes Historical Provider, MD  denosumab (PROLIA) 60 MG/ML SOLN injection Inject 60 mg into the skin every 6 (six) months. Administer in upper arm, thigh, or abdomen    Historical Provider, MD     Assessment/Plan Acute calculous cholecystitis S/p LAPAROSCOPIC CHOLECYSTECTOMY SINGLE SITE WITH INTRAOPERATIVE CHOLANGIOGRAM,LAPAROSCOPIC LYSIS OF ADHESIONS WEDGE LIVER BIOPSY, Dr. Michael Boston, 10/27/14.   GERD  Chest pain  Barrett esophagus  History of GI  diverticular bleed  Hypertension  Chronic right shoulder pain   Plan:  Seems to be getting better.  Cont IVF's and IV abx Ileus: Will try some clears today Hyponatremia: will get medical eval.     LOS: 5 days    Margene Cherian C. [id number]

## 2014-10-31 NOTE — Consult Note (Signed)
[facility] Medical Consultation  Terri Shah RDE:[id number] DOB: 09-29-1934 DOA: 10/26/2014 PCP: Precious Reel, MD   Requesting physician: Leighton Ruff MD Date of consultation: 10/31/14 Reason for consultation: Hyponatremia  Impression/Recommendations Principal Problem:   Acute calculous cholecystitis - Per primary team.  Active Problems:   GERD - stable on Protonix  Hyponatremia - patient reports that prior to this problem her sodium levels were low normal. - I suspect this is most likely due to poor oral solute intake - Suspicion for SIADH low given the patient's sodium level did not drop while on normal saline. - We'll administer her salt tablet 1 and reassess sodium levels next a.m. - at this juncture favor continuing current normal saline regimen, will assess BMP this evening and in the a.m.    Chest pain - Troponin and EKG negative    Abdominal fluid collection - Primary team managing   Chief Complaint: abdominal discomfort and cholecystitis  HPI:  Patient was admitted 10/26/2014 secondary to abdominal discomfort associated with cholecystitis.she reported sharp chest and upper abdominal epigastric type pain. Her gastroenterologist referred patient to the surgeons for evaluation given the above findings. During her hospital stay patient has had low sodium levels and as a result we have been consulted for medical evaluation recommendations.  Review of Systems:  14 point review of systems reviewed and Negative unless otherwise mentioned above  Past Medical History  Diagnosis Date  . Hypertension   . Anemia   . Hyperlipidemia   . Arthritis   . Diverticulosis   . Gastritis   . Hiatal hernia   . Barrett esophagus   . Cataract   . History of GI diverticular bleed   . Presbyesophagus   . Iron deficiency anemia   . Palpitations 04/02/2010    Qualifier: Diagnosis of  By: Angelena Form, MD, Harrell Gave    . ECTOPIC PREGNANCY 02/08/2008    Qualifier: Diagnosis  of  By: Mat Carne    . Barrett's esophagus 11/02/2007    Qualifier: Diagnosis of  By: Mat Carne    . GASTRITIS 11/02/2007    Qualifier: Diagnosis of  By: Mat Carne     Past Surgical History  Procedure Laterality Date  . Cesarean section  1950-1960s    x 3  . Tubal ligation    . Facial cosmetic surgery    . Cataract extraction, bilateral    . Laparoscopic cholecystectomy single site with intraoperative cholangiogram N/A 10/27/2014    Procedure: LAPAROSCOPIC CHOLECYSTECTOMY SINGLE SITE WITH INTRAOPERATIVE CHOLANGIOGRAM;  Surgeon: Michael Boston, MD;  Location: [facility];  Service: General;  Laterality: N/A;   Social History:  reports that she has never smoked. She has never used smokeless tobacco. She reports that she drinks alcohol. She reports that she does not use illicit drugs.  Allergies  Allergen Reactions  . Codeine Other (See Comments)    "Perception off"    Family History  Problem Relation Age of Onset  . Heart disease Mother   . Heart disease Father   . Breast cancer Sister   . Colon cancer Neg Hx   . Diabetes Sister   . Kidney disease Sister   . Stroke Mother     Prior to Admission medications   Medication Sig Start Date End Date Taking? Authorizing Provider  acetaminophen (TYLENOL) 500 MG tablet Take 500 mg by mouth every 6 (six) hours as needed. Patient used this medication for her pain.   Yes Historical Provider, MD  amitriptyline (ELAVIL) 25 MG  tablet Take 25 mg by mouth at bedtime.    Yes Historical Provider, MD  atorvastatin (LIPITOR) 20 MG tablet Take 20 mg by mouth daily.     Yes Historical Provider, MD  benazepril (LOTENSIN) 20 MG tablet Take 20 mg by mouth daily.     Yes Historical Provider, MD  brimonidine-timolol (COMBIGAN) 0.2-0.5 % ophthalmic solution Place 1 drop into the right eye every 12 (twelve) hours.   Yes Historical Provider, MD  cholecalciferol (VITAMIN D) 1000 UNITS tablet Take 1,000 Units by mouth daily.     Yes Historical  Provider, MD  hydrochlorothiazide (,MICROZIDE/HYDRODIURIL,) 12.5 MG capsule Take 12.5 mg by mouth daily. Take 1 tab every other day.   Yes Historical Provider, MD  omeprazole (PRILOSEC) 20 MG capsule Take 20 mg by mouth daily.    Yes Historical Provider, MD  Psyllium (METAMUCIL PO) Take 1 each by mouth at bedtime.   Yes Historical Provider, MD  traMADol (ULTRAM) 50 MG tablet Take 50 mg by mouth every 6 (six) hours as needed for moderate pain.    Yes Historical Provider, MD  denosumab (PROLIA) 60 MG/ML SOLN injection Inject 60 mg into the skin every 6 (six) months. Administer in upper arm, thigh, or abdomen    Historical Provider, MD   Physical Exam: Blood pressure 151/69, pulse 80, temperature 97.4 F (36.3 C), temperature source Oral, resp. rate 16, height 5\' 1"  (1.549 m), weight 57.153 kg (126 lb), SpO2 96 %. Filed Vitals:   10/31/14 0600  BP: 151/69  Pulse: 80  Temp: 97.4 F (36.3 C)  Resp: 16     General:  Pt in nad, alert and awake  Eyes: EOMI, nonicteric  ENT: normal exterior appearance, no masses on visual examination  Neck: supple, no goiter  Cardiovascular: rrr, no mrg  Respiratory: cta bl, no wheezes  Abdomen: ND, soft  Skin: warm and dry  Musculoskeletal: no cyanosis or clubbing  Psychiatric: mood and affect appropriate  Neurologic: answers questions appropriately and moves extremities equally  Labs on Admission:  Basic Metabolic Panel:  Recent Labs Lab 10/26/14 1540 10/29/14 0433 10/30/14 0420 10/31/14 0458  NA 131* 129* 127* 128*  K 3.7 4.4 4.7 4.4  CL 90* 96 96 96  CO2 23 21 19 21   GLUCOSE 139* 135* 180* 101*  BUN 9 5* 8 9  CREATININE 0.50 0.48* 0.43* 0.48*  CALCIUM 9.8 8.5 8.5 7.6*   Liver Function Tests:  Recent Labs Lab 10/26/14 1540 10/27/14 0519 10/29/14 0433 10/30/14 0420 10/31/14 0458  AST 17 20 37 18 17  ALT 9 11 60* 41* 25  ALKPHOS 62 57 54 52 36*  BILITOT 0.6 0.6 0.4 0.6 0.7  PROT 7.4 7.0 6.3 6.3 5.5*  ALBUMIN 4.0 3.5  2.7* 2.5* 2.2*    Recent Labs Lab 10/26/14 1540  LIPASE 29   No results for input(s): AMMONIA in the last 168 hours. CBC:  Recent Labs Lab 10/26/14 1540 10/29/14 0433 10/30/14 0420 10/31/14 0458  WBC 12.4* 14.9* 16.2* 12.0*  HGB 12.8 10.8* 12.6 11.3*  HCT 37.2 32.5* 37.4 34.5*  MCV 88.4 90.5 89.3 90.8  PLT 308 262 325 314   Cardiac Enzymes:  Recent Labs Lab 10/26/14 1540  TROPONINI <0.30   BNP: Invalid input(s): POCBNP CBG: No results for input(s): GLUCAP in the last 168 hours.  Radiological Exams on Admission: Ct Abdomen Pelvis W Contrast  10/29/2014   CLINICAL DATA:  80 year old hypertensive female presenting with abdominal pain and inability to pass gas. Nausea  and vomiting. Cholecystectomy 10/27/2014. Subsequent encounter.  EXAM: CT ABDOMEN AND PELVIS WITH CONTRAST  TECHNIQUE: Multidetector CT imaging of the abdomen and pelvis was performed using the standard protocol following bolus administration of intravenous contrast.  CONTRAST:  155mL OMNIPAQUE IOHEXOL 300 MG/ML  SOLN  COMPARISON:  10/26/2014 CT.  FINDINGS: Post recent cholecystectomy. Fluid collection at the cholecystectomy site may represent expected fluid versus bile leak. Anterior abdominal wall incision healing by secondary intent.  Tiny amount of free intraperitoneal air may be related to recent postoperative state.  Abnormal appearance of small bowel with predominately fluid filled dilated loops of small bowel without point of obstruction identified. No anterior abdominal wall bowel containing hernia identified. The small bowel obstruction may be related to adhesions. Mild to moderate amount of ascites.  Mesenteric vessels slightly to the right of midline without true twist.  Colonic interposition with omentum stretch towards right upper quadrant. Within the omentum, small area of inflammation new from the prior exam.  Scattered colonic diverticula.  Fluid-filled distal esophagus and proximal aspect of the  stomach.  Bibasilar atelectasis with small pleural effusions.  Liver cysts once again noted. No worrisome splenic, pancreatic, renal or adrenal lesion.  Scoliosis lumbar spine with superimposed degenerative changes.  Heart size top-normal.  Atherosclerotic type changes of the abdominal aorta without aneurysmal dilation. Atherosclerotic type changes left common iliac artery with very mild atherosclerotic type changes origin of the superior mesenteric artery and celiac artery.  IMPRESSION: Recent cholecystectomy. At the cholecystectomy site, fluid collection is noted. Although it is possible this is related to simple postoperative changes bile leak or infection cannot be excluded.  Small bowel obstructive pattern.  This may be related to adhesions.  Moderate amount of ascites.  Tiny matter free intraperitoneal air not unexpected this recently postop.  Bibasilar atelectasis and small pleural effusions.  Right colonic interposition of bowel with omentum stretched towards the right upper quadrant with minimal inflammation of the omentum new from the prior exam.  Sigmoid diverticula.  These results will be called to the ordering clinician or representative by the Radiologist Assistant, and communication documented in the PACS or zVision Dashboard.   Electronically Signed   By: Chauncey Cruel M.D.   On: 10/29/2014 15:45   Ct Image Guided Drainage By Percutaneous Catheter  10/30/2014   CLINICAL DATA:  Peritoneal fluid  EXAM: CT IMAGE GUIDED DRAINAGE BY PERCUTANEOUS CATHETER  FLUOROSCOPY TIME:  None  MEDICATIONS AND MEDICAL HISTORY: Versed 1.5 mg, Fentanyl 50 mcg.  Additional Medications: None.  ANESTHESIA/SEDATION: Moderate sedation time: 15 minutes  CONTRAST:  None  PROCEDURE: The procedure, risks, benefits, and alternatives were explained to the patient. Questions regarding the procedure were encouraged and answered. The patient understands and consents to the procedure.  The right upper quadrant was prepped with  Betadine in a sterile fashion, and a sterile drape was applied covering the operative field. A sterile gown and sterile gloves were used for the procedure.  Under CT guidance, an 18 gauge needle was inserted into the peritoneal perihepatic fluid collection and removed over an Amplatz. A 12 French dilator followed by a 12 French abscess strain were inserted and looped and string fixed.  FINDINGS: Images document needle placement in the peritoneal fluid about the liver.  COMPLICATIONS: None  IMPRESSION: Successful peritoneal fluid drain about the liver.   Electronically Signed   By: Maryclare Bean M.D.   On: 10/30/2014 15:55    EKG: Independently reviewed. Normal sinus rhythm with no ST elevations or depressions  Time spent: 20 minutes  Velvet Bathe [facility] Pager [phone]  If 7PM-7AM, please contact night-coverage [email or web address] Password [facility] 10/31/2014, 2:43 PM

## 2014-10-31 NOTE — Progress Notes (Signed)
Called patient says she feels like food stops when she swallows.  He abdomen is distended and BS are hypoactive, all she has attempted to take in so far are clear liquids.   On Exam she is not overly tender, considering drain and surgery.  She is distended and BS are hyperactive.  No nausea, vomiting, dry heaves, no flatus or BM. Drain remains serous fluid.  Plan:  Clears and get a film now.

## 2014-11-01 LAB — MAGNESIUM: Magnesium: 1.9 mg/dL (ref 1.5–2.5)

## 2014-11-01 LAB — CBC
HCT: 30.7 % — ABNORMAL LOW (ref 36.0–46.0)
Hemoglobin: 10.1 g/dL — ABNORMAL LOW (ref 12.0–15.0)
MCH: 30.1 pg (ref 26.0–34.0)
MCHC: 32.9 g/dL (ref 30.0–36.0)
MCV: 91.4 fL (ref 78.0–100.0)
Platelets: 313 10*3/uL (ref 150–400)
RBC: 3.36 MIL/uL — ABNORMAL LOW (ref 3.87–5.11)
RDW: 13.1 % (ref 11.5–15.5)
WBC: 12.7 10*3/uL — ABNORMAL HIGH (ref 4.0–10.5)

## 2014-11-01 LAB — BASIC METABOLIC PANEL
Anion gap: 12 (ref 5–15)
BUN: 6 mg/dL (ref 6–23)
CO2: 20 mEq/L (ref 19–32)
Calcium: 7.2 mg/dL — ABNORMAL LOW (ref 8.4–10.5)
Chloride: 98 mEq/L (ref 96–112)
Creatinine, Ser: 0.42 mg/dL — ABNORMAL LOW (ref 0.50–1.10)
GFR calc Af Amer: >90 mL/min (ref >90)
GFR calc non Af Amer: >90 mL/min (ref >90)
Glucose, Bld: 68 mg/dL — ABNORMAL LOW (ref 70–99)
Potassium: 4 mEq/L (ref 3.7–5.3)
Sodium: 130 mEq/L — ABNORMAL LOW (ref 137–147)

## 2014-11-01 LAB — PHOSPHORUS: Phosphorus: 1.4 mg/dL — ABNORMAL LOW (ref 2.3–4.6)

## 2014-11-01 MED ORDER — HYDRALAZINE HCL 20 MG/ML IJ SOLN: 5.0000 mg | Freq: Four times a day (QID) | INTRAMUSCULAR | Status: DC | PRN

## 2014-11-01 MED ORDER — DEXTROSE 5 % IV SOLN
30.0000 mmol | Freq: Once | INTRAVENOUS | Status: AC
  Administered 2014-11-01: 30 mmol via INTRAVENOUS
  Filled 2014-11-01: qty 10

## 2014-11-01 MED ORDER — ENSURE PUDDING PO PUDG
1.0000 | Freq: Three times a day (TID) | ORAL | Status: DC
  Administered 2014-11-01 – 2014-11-04 (×8): 1 via ORAL
  Filled 2014-11-01 (×11): qty 1

## 2014-11-01 NOTE — Progress Notes (Signed)
5 Days Post-Op  Subjective: She is walking, having diarrhea, and not having as much discomfort Fluid from the drain is serous and clear. Having diarrhea, family says it started at [facility] yesterday.  He has it written down. Objective: Vital signs in last 24 hours: Temp:  [98.2 F (36.8 C)-99 F (37.2 C)] 98.2 F (36.8 C) (11/13 0556) Pulse Rate:  [80-85] 82 (11/13 0556) Resp:  [16-18] 18 (11/13 0556) BP: (151-158)/(80-82) 158/80 mmHg (11/13 0556) SpO2:  [95 %-98 %] 98 % (11/13 0556) Last BM Date: 10/26/14 4 stools recorded yesterday, drain 200 ml No diet, ice chips last PM Afebrile, VSS, BP is more normal now Labs about the same No growth on the cultures so far. Intake/Output from previous day: 11/12 0701 - 11/13 0700 In: 2495 [I.V.:2390; IV Piggyback:100] Out: 1950 [Urine:1750; Drains:200] Intake/Output this shift: Total I/O In: 0  Out: 240 [Urine:150; Drains:90]  General appearance: alert, cooperative and no distress Resp: clear to auscultation bilaterally GI: soft, still distended, some bowel sounds, a little hypoactive, some minimal distension..  she is still tender aruound the umbilical site and the drain, but the best I have seen her so far this week.  Lab Results:   Recent Labs  10/31/14 0458 11/01/14 0446  WBC 12.0* 12.7*  HGB 11.3* 10.1*  HCT 34.5* 30.7*  PLT 314 313    BMET  Recent Labs  10/31/14 1602 11/01/14 0446  NA 133* 130*  K 4.2 4.0  CL 99 98  CO2 21 20  GLUCOSE 99 68*  BUN 8 6  CREATININE 0.49* 0.42*  CALCIUM 7.7* 7.2*   PT/INR  Recent Labs  10/30/14 0420  LABPROT 14.4  INR 1.11     Recent Labs Lab 10/26/14 1540 10/27/14 0519 10/29/14 0433 10/30/14 0420 10/31/14 0458  AST 17 20 37 18 17  ALT 9 11 60* 41* 25  ALKPHOS 62 57 54 52 36*  BILITOT 0.6 0.6 0.4 0.6 0.7  PROT 7.4 7.0 6.3 6.3 5.5*  ALBUMIN 4.0 3.5 2.7* 2.5* 2.2*     Lipase     Component Value Date/Time   LIPASE 29 10/26/2014 1540     Studies/Results: Dg  Abd Portable 1v  10/31/2014   CLINICAL DATA:  Abdominal distension, small bowel obstruction  EXAM: PORTABLE ABDOMEN - 1 VIEW  COMPARISON:  CT abdomen and pelvis 10/29/2014  FINDINGS: Peritoneal drain and RIGHT upper quadrant.  Persistent dilatation of small bowel loops throughout the abdomen.  Small amount retained contrast within small bowel loops in the LEFT mid abdomen.  No significant colonic gas.  No definite bowel wall thickening.  Bones demineralized with degenerative changes and scoliosis lumbar spine.  IMPRESSION: Persistent small bowel obstruction.   Electronically Signed   By: Lavonia Dana M.D.   On: 10/31/2014 16:31   Ct Image Guided Drainage By Percutaneous Catheter  10/30/2014   CLINICAL DATA:  Peritoneal fluid  EXAM: CT IMAGE GUIDED DRAINAGE BY PERCUTANEOUS CATHETER  FLUOROSCOPY TIME:  None  MEDICATIONS AND MEDICAL HISTORY: Versed 1.5 mg, Fentanyl 50 mcg.  Additional Medications: None.  ANESTHESIA/SEDATION: Moderate sedation time: 15 minutes  CONTRAST:  None  PROCEDURE: The procedure, risks, benefits, and alternatives were explained to the patient. Questions regarding the procedure were encouraged and answered. The patient understands and consents to the procedure.  The right upper quadrant was prepped with Betadine in a sterile fashion, and a sterile drape was applied covering the operative field. A sterile gown and sterile gloves were used for the procedure.  Under CT guidance, an 18 gauge needle was inserted into the peritoneal perihepatic fluid collection and removed over an Amplatz. A 12 French dilator followed by a 12 French abscess strain were inserted and looped and string fixed.  FINDINGS: Images document needle placement in the peritoneal fluid about the liver.  COMPLICATIONS: None  IMPRESSION: Successful peritoneal fluid drain about the liver.   Electronically Signed   By: Maryclare Bean M.D.   On: 10/30/2014 15:55    Medications: . amitriptyline  25 mg Oral QHS  . atorvastatin  20 mg  Oral q1800  . benazepril  20 mg Oral Daily  . brimonidine  1 drop Right Eye Q12H  . cholecalciferol  1,000 Units Oral Daily  . heparin  5,000 Units Subcutaneous 3 times per day  . lip balm  1 application Topical BID  . pantoprazole  40 mg Oral Daily  . piperacillin-tazobactam (ZOSYN)  IV  3.375 g Intravenous Q8H  . polyethylene glycol  17 g Oral BID  . saccharomyces boulardii  250 mg Oral BID  . sodium phosphate  Dextrose 5% IVPB  30 mmol Intravenous Once  . timolol  1 drop Right Eye Q12H    Assessment/Plan  Acute calculous cholecystitis Post op ileus and fluid collection  GERD  Chest pain  Barrett esophagus  History of GI diverticular bleed  Hypertension  Hyponatremia, low phosphate, phosphate being replaced.   Plan:  Clears, continue to mobilize.  See how she does.  Continue antibiotics, for now, consider how long we need the drain.  I don't see anything that looks bilious.  WBC still up.  UA had 15K colonies on admit.    LOS: 6 days    Yeshaya Vath 11/01/2014

## 2014-11-01 NOTE — Progress Notes (Signed)
Patient ID: Terri Shah, female   DOB: 1934/07/31, 80 y.o.   MRN: [id number]  [facility] PROGRESS NOTE  Terri Shah RCV:[id number] DOB: 1934-07-08 DOA: 10/26/2014 PCP: Precious Reel, MD  Brief narrative: Patient was admitted 10/26/2014 secondary to abdominal discomfort associated with cholecystitis. Her gastroenterologist referred patient to the surgeons for evaluation. During her hospital stay patient has had low sodium levels and as a result we have been consulted for medical evaluation recommendations.  Assessment and Plan:    Principal Problem:   Acute calculous cholecystitis - Post op ileus and fluid collection - management per primary team  - continue Zosyn day #4 Active Problems:   Hyponatremia - trending: 127 --> 128 --> 133 --> 130 this AM - continue IVF, repeat BMP in AM   Hypophosphatemia - supplement    Leukocytosis - secondary to the above - continue Zosyn and repeat CBC in AM   Barrett esophagus - stable, continue Protonix    History of GI diverticular bleed - drop in Hg over the past 24 hours: 11.3 --> 10.1 - possible dilutional component as no clear active bleeding reported - repeat CBC in AM   Hypertension - slightly above target range - continue Benazepril and add Hydralazine IV as needed for SBP > 155    HLD - continue statin   DVT prophylaxis  Heparin SQ while pt is in hospital  Code Status: Full Family Communication: Pt and family at bedside Disposition Plan: Remains inpatient   IV Access:   Peripheral IV Procedures and diagnostic studies:   Dg Abd Portable 1v  10/31/2014  Persistent small bowel obstruction.   Ct Image Guided Drainage By Percutaneous Catheter  10/30/2014   Successful peritoneal fluid drain about the liver.    Medical Consultants:   [facility] Anti-Infectives:   Zosyn 11/10 -->  Faye Ramsay, MD  [facility] Pager [phone]  If 7PM-7AM, please contact night-coverage [email or web address] Password [email or web address] 11/01/2014, 2:08  PM   LOS: 6 days   HPI/Subjective: No events overnight.   Objective: Filed Vitals:   10/31/14 1400 10/31/14 2215 11/01/14 0556 11/01/14 1319  BP: 151/82 154/80 158/80 150/70  Pulse: 85 80 82 76  Temp: 99 F (37.2 C) 98.2 F (36.8 C) 98.2 F (36.8 C) 98.6 F (37 C)  TempSrc: Oral Oral Oral Oral  Resp: 16 18 18 16   Height:      Weight:      SpO2: 96% 95% 98% 96%    Intake/Output Summary (Last 24 hours) at 11/01/14 1408 Last data filed at 11/01/14 1319  Gross per 24 hour  Intake   1795 ml  Output   2020 ml  Net   -225 ml    Exam:   General:  Pt is alert, follows commands appropriately, not in acute distress  Cardiovascular: Regular rate and rhythm,  no rubs, no gallops  Respiratory: Clear to auscultation bilaterally, no wheezing, no crackles, no rhonchi  Abdomen: Soft, non distended, bowel sounds present, no guarding  Extremities: No edema, pulses DP and PT palpable bilaterally  Neuro: Grossly nonfocal  Data Reviewed: Basic Metabolic Panel:  Recent Labs Lab 10/29/14 0433 10/30/14 0420 10/31/14 0458 10/31/14 1602 11/01/14 0446  NA 129* 127* 128* 133* 130*  K 4.4 4.7 4.4 4.2 4.0  CL 96 96 96 99 98  CO2 21 19 21 21 20   GLUCOSE 135* 180* 101* 99 68*  BUN 5* 8 9 8 6   CREATININE 0.48* 0.43* 0.48* 0.49* 0.42*  CALCIUM 8.5 8.5 7.6* 7.7*  7.2*  MG  --   --   --   --  1.9  PHOS  --   --   --   --  1.4*   Liver Function Tests:  Recent Labs Lab 10/26/14 1540 10/27/14 0519 10/29/14 0433 10/30/14 0420 10/31/14 0458  AST 17 20 37 18 17  ALT 9 11 60* 41* 25  ALKPHOS 62 57 54 52 36*  BILITOT 0.6 0.6 0.4 0.6 0.7  PROT 7.4 7.0 6.3 6.3 5.5*  ALBUMIN 4.0 3.5 2.7* 2.5* 2.2*    Recent Labs Lab 10/26/14 1540  LIPASE 29   CBC:  Recent Labs Lab 10/26/14 1540 10/29/14 0433 10/30/14 0420 10/31/14 0458 11/01/14 0446  WBC 12.4* 14.9* 16.2* 12.0* 12.7*  HGB 12.8 10.8* 12.6 11.3* 10.1*  HCT 37.2 32.5* 37.4 34.5* 30.7*  MCV 88.4 90.5 89.3 90.8 91.4   PLT 308 262 325 314 313   Cardiac Enzymes:  Recent Labs Lab 10/26/14 1540  TROPONINI <0.30   Recent Results (from the past 240 hour(s))  Urine culture     Status: None   Collection Time: 10/26/14  4:52 PM  Result Value Ref Range Status   Specimen Description URINE, CLEAN CATCH  Final   Special Requests Normal  Final   Culture  Setup Time   Final    10/26/2014 22:07 Performed at [facility]   Final    15,000 COLONIES/ML Performed at [facility]    Culture   Final    Multiple bacterial morphotypes present, none predominant. Suggest appropriate recollection if clinically indicated. Performed at [facility]    Report Status 10/28/2014 FINAL  Final  Surgical pcr screen     Status: None   Collection Time: 10/26/14 10:50 PM  Result Value Ref Range Status   MRSA, PCR NEGATIVE NEGATIVE Final   Staphylococcus aureus NEGATIVE NEGATIVE Final    Comment:        The Xpert SA Assay (FDA approved for NASAL specimens in patients over 57 years of age), is one component of a comprehensive surveillance program.  Test performance has been validated by [employer] for patients greater than or equal to 63 year old. It is not intended to diagnose infection nor to guide or monitor treatment.   Anaerobic culture     Status: None (Preliminary result)   Collection Time: 10/30/14  3:44 PM  Result Value Ref Range Status   Specimen Description VITREOUS FLUID  Final   Special Requests NONE  Final   Gram Stain   Final    MODERATE WBC PRESENT,BOTH PMN AND MONONUCLEAR NO ORGANISMS SEEN Performed at [facility]    Culture   Final    NO ANAEROBES ISOLATED; CULTURE IN PROGRESS FOR 5 DAYS Performed at [facility]    Report Status PENDING  Incomplete  Body fluid culture     Status: None (Preliminary result)   Collection Time: 10/30/14  3:44 PM  Result Value Ref Range Status   Specimen Description VITREOUS FLUID  Final   Special  Requests NONE  Final   Gram Stain   Final    MODERATE WBC PRESENT,BOTH PMN AND MONONUCLEAR NO ORGANISMS SEEN Performed at [facility]    Culture   Final    NO GROWTH 1 DAY Performed at [facility]    Report Status PENDING  Incomplete     Scheduled Meds: . amitriptyline  25 mg Oral QHS  . atorvastatin  20 mg Oral q1800  . benazepril  20 mg Oral Daily  . brimonidine  1 drop Right Eye Q12H  . cholecalciferol  1,000 Units Oral Daily  . feeding supplement (ENSURE)  1 Container Oral TID BM  . heparin  5,000 Units Subcutaneous 3 times per day  . lip balm  1 application Topical BID  . pantoprazole  40 mg Oral Daily  . piperacillin-tazobactam (ZOSYN)  IV  3.375 g Intravenous Q8H  . saccharomyces boulardii  250 mg Oral BID  . sodium phosphate  Dextrose 5% IVPB  30 mmol Intravenous Once  . timolol  1 drop Right Eye Q12H   Continuous Infusions: . 0.9 % NaCl with KCl 20 mEq / L 100 mL (11/01/14 5364)

## 2014-11-01 NOTE — Progress Notes (Signed)
Patient ID: Terri Shah, female   DOB: 04/30/1934, 80 y.o.   MRN: [id number]   Referring Physician(s): CCS  Subjective:  Pt feeling a little better today; has had BM's; denies N/V; a little sore at RUQ drain site  Allergies: Codeine  Medications: Prior to Admission medications   Medication Sig Start Date End Date Taking? Authorizing Provider  acetaminophen (TYLENOL) 500 MG tablet Take 500 mg by mouth every 6 (six) hours as needed. Patient used this medication for her pain.   Yes Historical Provider, MD  amitriptyline (ELAVIL) 25 MG tablet Take 25 mg by mouth at bedtime.    Yes Historical Provider, MD  atorvastatin (LIPITOR) 20 MG tablet Take 20 mg by mouth daily.     Yes Historical Provider, MD  benazepril (LOTENSIN) 20 MG tablet Take 20 mg by mouth daily.     Yes Historical Provider, MD  brimonidine-timolol (COMBIGAN) 0.2-0.5 % ophthalmic solution Place 1 drop into the right eye every 12 (twelve) hours.   Yes Historical Provider, MD  cholecalciferol (VITAMIN D) 1000 UNITS tablet Take 1,000 Units by mouth daily.     Yes Historical Provider, MD  hydrochlorothiazide (,MICROZIDE/HYDRODIURIL,) 12.5 MG capsule Take 12.5 mg by mouth daily. Take 1 tab every other day.   Yes Historical Provider, MD  omeprazole (PRILOSEC) 20 MG capsule Take 20 mg by mouth daily.    Yes Historical Provider, MD  Psyllium (METAMUCIL PO) Take 1 each by mouth at bedtime.   Yes Historical Provider, MD  traMADol (ULTRAM) 50 MG tablet Take 50 mg by mouth every 6 (six) hours as needed for moderate pain.    Yes Historical Provider, MD  denosumab (PROLIA) 60 MG/ML SOLN injection Inject 60 mg into the skin every 6 (six) months. Administer in upper arm, thigh, or abdomen    Historical Provider, MD    Review of Systems   see above  Vital Signs: BP 158/80 mmHg  Pulse 82  Temp(Src) 98.2 F (36.8 C) (Oral)  Resp 18  Ht 5\' 1"  (1.549 m)  Wt 126 lb (57.153 kg)  BMI 23.82 kg/m2  SpO2 98%  Physical Exam pt awake/alert;  RUQ drain intact, insertion site clean and dry, mildly tender; last output recorded 200 cc's amber fluid, about 150 cc's in bag now; cx's pending Imaging: Ct Abdomen Pelvis W Contrast  10/29/2014   CLINICAL DATA:  80 year old hypertensive female presenting with abdominal pain and inability to pass gas. Nausea and vomiting. Cholecystectomy 10/27/2014. Subsequent encounter.  EXAM: CT ABDOMEN AND PELVIS WITH CONTRAST  TECHNIQUE: Multidetector CT imaging of the abdomen and pelvis was performed using the standard protocol following bolus administration of intravenous contrast.  CONTRAST:  195mL OMNIPAQUE IOHEXOL 300 MG/ML  SOLN  COMPARISON:  10/26/2014 CT.  FINDINGS: Post recent cholecystectomy. Fluid collection at the cholecystectomy site may represent expected fluid versus bile leak. Anterior abdominal wall incision healing by secondary intent.  Tiny amount of free intraperitoneal air may be related to recent postoperative state.  Abnormal appearance of small bowel with predominately fluid filled dilated loops of small bowel without point of obstruction identified. No anterior abdominal wall bowel containing hernia identified. The small bowel obstruction may be related to adhesions. Mild to moderate amount of ascites.  Mesenteric vessels slightly to the right of midline without true twist.  Colonic interposition with omentum stretch towards right upper quadrant. Within the omentum, small area of inflammation new from the prior exam.  Scattered colonic diverticula.  Fluid-filled distal esophagus and proximal aspect of the  stomach.  Bibasilar atelectasis with small pleural effusions.  Liver cysts once again noted. No worrisome splenic, pancreatic, renal or adrenal lesion.  Scoliosis lumbar spine with superimposed degenerative changes.  Heart size top-normal.  Atherosclerotic type changes of the abdominal aorta without aneurysmal dilation. Atherosclerotic type changes left common iliac artery with very mild  atherosclerotic type changes origin of the superior mesenteric artery and celiac artery.  IMPRESSION: Recent cholecystectomy. At the cholecystectomy site, fluid collection is noted. Although it is possible this is related to simple postoperative changes bile leak or infection cannot be excluded.  Small bowel obstructive pattern.  This may be related to adhesions.  Moderate amount of ascites.  Tiny matter free intraperitoneal air not unexpected this recently postop.  Bibasilar atelectasis and small pleural effusions.  Right colonic interposition of bowel with omentum stretched towards the right upper quadrant with minimal inflammation of the omentum new from the prior exam.  Sigmoid diverticula.  These results will be called to the ordering clinician or representative by the Radiologist Assistant, and communication documented in the PACS or zVision Dashboard.   Electronically Signed   By: Chauncey Cruel M.D.   On: 10/29/2014 15:45   Dg Abd Portable 1v  10/31/2014   CLINICAL DATA:  Abdominal distension, small bowel obstruction  EXAM: PORTABLE ABDOMEN - 1 VIEW  COMPARISON:  CT abdomen and pelvis 10/29/2014  FINDINGS: Peritoneal drain and RIGHT upper quadrant.  Persistent dilatation of small bowel loops throughout the abdomen.  Small amount retained contrast within small bowel loops in the LEFT mid abdomen.  No significant colonic gas.  No definite bowel wall thickening.  Bones demineralized with degenerative changes and scoliosis lumbar spine.  IMPRESSION: Persistent small bowel obstruction.   Electronically Signed   By: Lavonia Dana M.D.   On: 10/31/2014 16:31   Ct Image Guided Drainage By Percutaneous Catheter  10/30/2014   CLINICAL DATA:  Peritoneal fluid  EXAM: CT IMAGE GUIDED DRAINAGE BY PERCUTANEOUS CATHETER  FLUOROSCOPY TIME:  None  MEDICATIONS AND MEDICAL HISTORY: Versed 1.5 mg, Fentanyl 50 mcg.  Additional Medications: None.  ANESTHESIA/SEDATION: Moderate sedation time: 15 minutes  CONTRAST:  None   PROCEDURE: The procedure, risks, benefits, and alternatives were explained to the patient. Questions regarding the procedure were encouraged and answered. The patient understands and consents to the procedure.  The right upper quadrant was prepped with Betadine in a sterile fashion, and a sterile drape was applied covering the operative field. A sterile gown and sterile gloves were used for the procedure.  Under CT guidance, an 18 gauge needle was inserted into the peritoneal perihepatic fluid collection and removed over an Amplatz. A 12 French dilator followed by a 12 French abscess strain were inserted and looped and string fixed.  FINDINGS: Images document needle placement in the peritoneal fluid about the liver.  COMPLICATIONS: None  IMPRESSION: Successful peritoneal fluid drain about the liver.   Electronically Signed   By: Maryclare Bean M.D.   On: 10/30/2014 15:55    Labs:  CBC:  Recent Labs  10/29/14 0433 10/30/14 0420 10/31/14 0458 11/01/14 0446  WBC 14.9* 16.2* 12.0* 12.7*  HGB 10.8* 12.6 11.3* 10.1*  HCT 32.5* 37.4 34.5* 30.7*  PLT 262 325 314 313    COAGS:  Recent Labs  10/30/14 0420  INR 1.11  APTT 30    BMP:  Recent Labs  10/30/14 0420 10/31/14 0458 10/31/14 1602 11/01/14 0446  NA 127* 128* 133* 130*  K 4.7 4.4 4.2 4.0  CL  96 96 99 98  CO2 19 21 21 20   GLUCOSE 180* 101* 99 68*  BUN 8 9 8 6   CALCIUM 8.5 7.6* 7.7* 7.2*  CREATININE 0.43* 0.48* 0.49* 0.42*  GFRNONAA >90 >90 >90 >90  GFRAA >90 >90 >90 >90    LIVER FUNCTION TESTS:  Recent Labs  10/27/14 0519 10/29/14 0433 10/30/14 0420 10/31/14 0458  BILITOT 0.6 0.4 0.6 0.7  AST 20 37 18 17  ALT 11 60* 41* 25  ALKPHOS 57 54 52 36*  PROT 7.0 6.3 6.3 5.5*  ALBUMIN 3.5 2.7* 2.5* 2.2*    Assessment and Plan: S/p drainage of perihepatic/peritoneal fluid collection 11/11 (post cholecystectomy); check final cx's/fluid bilirubin level, monitor labs ;  monitor drain output- check f/u CT once output <10-15  cc's or sooner if clinical status worsens or WBC cont to climb, currently 12.7 (12.0); abd film 11/12- persistent SBO; ambulate     I spent a total of 15 minutes face to face in clinical consultation/evaluation, greater than 50% of which was counseling/coordinating care for RUQ fluid collection drainage.  Signed: Autumn Messing 11/01/2014, 8:59 AM

## 2014-11-02 LAB — MAGNESIUM: Magnesium: 2 mg/dL (ref 1.5–2.5)

## 2014-11-02 LAB — CBC
HCT: 33.1 % — ABNORMAL LOW (ref 36.0–46.0)
Hemoglobin: 11.1 g/dL — ABNORMAL LOW (ref 12.0–15.0)
MCH: 30.4 pg (ref 26.0–34.0)
MCHC: 33.5 g/dL (ref 30.0–36.0)
MCV: 90.7 fL (ref 78.0–100.0)
Platelets: 351 10*3/uL (ref 150–400)
RBC: 3.65 MIL/uL — ABNORMAL LOW (ref 3.87–5.11)
RDW: 13 % (ref 11.5–15.5)
WBC: 11.3 10*3/uL — ABNORMAL HIGH (ref 4.0–10.5)

## 2014-11-02 LAB — BASIC METABOLIC PANEL
Anion gap: 12 (ref 5–15)
BUN: 3 mg/dL — ABNORMAL LOW (ref 6–23)
CO2: 22 mEq/L (ref 19–32)
Calcium: 7.3 mg/dL — ABNORMAL LOW (ref 8.4–10.5)
Chloride: 96 mEq/L (ref 96–112)
Creatinine, Ser: 0.43 mg/dL — ABNORMAL LOW (ref 0.50–1.10)
GFR calc Af Amer: >90 mL/min (ref >90)
GFR calc non Af Amer: >90 mL/min (ref >90)
Glucose, Bld: 88 mg/dL (ref 70–99)
Potassium: 3.8 mEq/L (ref 3.7–5.3)
Sodium: 130 mEq/L — ABNORMAL LOW (ref 137–147)

## 2014-11-02 LAB — PHOSPHORUS: Phosphorus: 1.7 mg/dL — ABNORMAL LOW (ref 2.3–4.6)

## 2014-11-02 MED ORDER — KCL IN DEXTROSE-NACL 20-5-0.45 MEQ/L-%-% IV SOLN
INTRAVENOUS | Status: DC
  Administered 2014-11-02: 50 mL/h via INTRAVENOUS
  Filled 2014-11-02 (×3): qty 1000

## 2014-11-02 NOTE — Progress Notes (Signed)
Pt hemodynamically stable this AM, VSS.   Hyponatremia - sodium trending: 127 --> 128 --> 133 --> 130 --> 130 this AM - repeat BMP in AM  Faye Ramsay, MD  [facility] Pager [phone]  If 7PM-7AM, please contact night-coverage [email or web address] Password [email or web address]

## 2014-11-02 NOTE — Plan of Care (Signed)
Problem: Phase I Progression Outcomes Goal: Tubes/drains patent Outcome: Progressing Pt with continued drainage noted straw colored emptied for  22ml.

## 2014-11-02 NOTE — Progress Notes (Signed)
6 Days Post-Op  Subjective: Still a little bloated, passing flatus and loose stool, gets full fast  Objective: Vital signs in last 24 hours: Temp:  [98.1 F (36.7 C)-98.6 F (37 C)] 98.1 F (36.7 C) (11/14 0559) Pulse Rate:  [74-79] 74 (11/14 0559) Resp:  [16-18] 18 (11/14 0559) BP: (146-166)/(68-73) 166/73 mmHg (11/14 0559) SpO2:  [96 %-100 %] 100 % (11/14 0559) Last BM Date: 10/26/14  Intake/Output from previous day: 11/13 0701 - 11/14 0700 In: 2295.8 [P.O.:600; I.V.:1395.8; IV Piggyback:300] Out: 2170 [Urine:1900; Drains:270] Intake/Output this shift:    GI: bs present mild distention incisions clean   Lab Results:   Recent Labs  11/01/14 0446 11/02/14 0538  WBC 12.7* 11.3*  HGB 10.1* 11.1*  HCT 30.7* 33.1*  PLT 313 351   BMET  Recent Labs  11/01/14 0446 11/02/14 0538  NA 130* 130*  K 4.0 3.8  CL 98 96  CO2 20 22  GLUCOSE 68* 88  BUN 6 3*  CREATININE 0.42* 0.43*  CALCIUM 7.2* 7.3*   PT/INR No results for input(s): LABPROT, INR in the last 72 hours. ABG No results for input(s): PHART, HCO3 in the last 72 hours.  Invalid input(s): PCO2, PO2  Studies/Results: Dg Abd Portable 1v  10/31/2014   CLINICAL DATA:  Abdominal distension, small bowel obstruction  EXAM: PORTABLE ABDOMEN - 1 VIEW  COMPARISON:  CT abdomen and pelvis 10/29/2014  FINDINGS: Peritoneal drain and RIGHT upper quadrant.  Persistent dilatation of small bowel loops throughout the abdomen.  Small amount retained contrast within small bowel loops in the LEFT mid abdomen.  No significant colonic gas.  No definite bowel wall thickening.  Bones demineralized with degenerative changes and scoliosis lumbar spine.  IMPRESSION: Persistent small bowel obstruction.   Electronically Signed   By: Lavonia Dana M.D.   On: 10/31/2014 16:31    Anti-infectives: Anti-infectives    Start     Dose/Rate Route Frequency Ordered Stop   10/29/14 1615  piperacillin-tazobactam (ZOSYN) IVPB 3.375 g     3.375 g12.5  mL/hr over 240 Minutes Intravenous Every 8 hours 10/29/14 1602     10/26/14 2115  cefTRIAXone (ROCEPHIN) 2 g in dextrose 5 % 50 mL IVPB  Status:  Discontinued    Comments:  Pharmacy may adjust dosing strength / duration / interval for maximal efficacy   2 g100 mL/hr over 30 Minutes Intravenous Every 24 hours 10/26/14 2106 10/29/14 1602      Assessment/Plan: POD 6 lap chole  Ileus appears resolving, wants to stay at fulls today with ensure but can advance if she desires Po pain meds 24 hours more abx then dc Drain likely can be removed by IR prior to dc hopefully tomorrow Check na in am again, appreciate im  [facility] 11/02/2014

## 2014-11-03 LAB — CBC
HCT: 29.2 % — ABNORMAL LOW (ref 36.0–46.0)
Hemoglobin: 9.8 g/dL — ABNORMAL LOW (ref 12.0–15.0)
MCH: 30.2 pg (ref 26.0–34.0)
MCHC: 33.6 g/dL (ref 30.0–36.0)
MCV: 90.1 fL (ref 78.0–100.0)
Platelets: 422 10*3/uL — ABNORMAL HIGH (ref 150–400)
RBC: 3.24 MIL/uL — ABNORMAL LOW (ref 3.87–5.11)
RDW: 13.2 % (ref 11.5–15.5)
WBC: 8.5 10*3/uL (ref 4.0–10.5)

## 2014-11-03 LAB — BODY FLUID CULTURE: Culture: NO GROWTH

## 2014-11-03 LAB — BASIC METABOLIC PANEL
Anion gap: 12 (ref 5–15)
BUN: <3 mg/dL — ABNORMAL LOW (ref 6–23)
CO2: 21 mEq/L (ref 19–32)
Calcium: 7.2 mg/dL — ABNORMAL LOW (ref 8.4–10.5)
Chloride: 96 mEq/L (ref 96–112)
Creatinine, Ser: 0.43 mg/dL — ABNORMAL LOW (ref 0.50–1.10)
GFR calc Af Amer: >90 mL/min (ref >90)
GFR calc non Af Amer: >90 mL/min (ref >90)
Glucose, Bld: 119 mg/dL — ABNORMAL HIGH (ref 70–99)
Potassium: 3.6 mEq/L — ABNORMAL LOW (ref 3.7–5.3)
Sodium: 129 mEq/L — ABNORMAL LOW (ref 137–147)

## 2014-11-03 NOTE — Progress Notes (Signed)
Patient ID: Terri Shah, female   DOB: [birth year], 80 y.o.   MRN: [id number]   Referring Physician(s): CCS  Subjective:  Pt feeling a little better today;  Diet advancing  Allergies: Codeine  Medications: Prior to Admission medications   Medication Sig Start Date End Date Taking? Authorizing Provider  acetaminophen (TYLENOL) 500 MG tablet Take 500 mg by mouth every 6 (six) hours as needed. Patient used this medication for her pain.   Yes Historical Provider, MD  amitriptyline (ELAVIL) 25 MG tablet Take 25 mg by mouth at bedtime.    Yes Historical Provider, MD  atorvastatin (LIPITOR) 20 MG tablet Take 20 mg by mouth daily.     Yes Historical Provider, MD  benazepril (LOTENSIN) 20 MG tablet Take 20 mg by mouth daily.     Yes Historical Provider, MD  brimonidine-timolol (COMBIGAN) 0.2-0.5 % ophthalmic solution Place 1 drop into the right eye every 12 (twelve) hours.   Yes Historical Provider, MD  cholecalciferol (VITAMIN D) 1000 UNITS tablet Take 1,000 Units by mouth daily.     Yes Historical Provider, MD  hydrochlorothiazide (,MICROZIDE/HYDRODIURIL,) 12.5 MG capsule Take 12.5 mg by mouth daily. Take 1 tab every other day.   Yes Historical Provider, MD  omeprazole (PRILOSEC) 20 MG capsule Take 20 mg by mouth daily.    Yes Historical Provider, MD  Psyllium (METAMUCIL PO) Take 1 each by mouth at bedtime.   Yes Historical Provider, MD  traMADol (ULTRAM) 50 MG tablet Take 50 mg by mouth every 6 (six) hours as needed for moderate pain.    Yes Historical Provider, MD  denosumab (PROLIA) 60 MG/ML SOLN injection Inject 60 mg into the skin every 6 (six) months. Administer in upper arm, thigh, or abdomen    Historical Provider, MD    Review of Systems   see above  Vital Signs: BP 148/72 mmHg  Pulse 71  Temp(Src) 98.4 F (36.9 C) (Oral)  Resp 16  Ht 5\' 1"  (1.549 m)  Wt 126 lb (57.153 kg)  BMI 23.82 kg/m2  SpO2 98%  Physical Exam pt awake/alert RUQ drain intact, site clean Output  still being recorded as ~21mL, serous  Imaging: Dg Abd Portable 1v  10/31/2014   CLINICAL DATA:  Abdominal distension, small bowel obstruction  EXAM: PORTABLE ABDOMEN - 1 VIEW  COMPARISON:  CT abdomen and pelvis 10/29/2014  FINDINGS: Peritoneal drain and RIGHT upper quadrant.  Persistent dilatation of small bowel loops throughout the abdomen.  Small amount retained contrast within small bowel loops in the LEFT mid abdomen.  No significant colonic gas.  No definite bowel wall thickening.  Bones demineralized with degenerative changes and scoliosis lumbar spine.  IMPRESSION: Persistent small bowel obstruction.   Electronically Signed   By: Lavonia Dana M.D.   On: 10/31/2014 16:31   Ct Image Guided Drainage By Percutaneous Catheter  10/30/2014   CLINICAL DATA:  Peritoneal fluid  EXAM: CT IMAGE GUIDED DRAINAGE BY PERCUTANEOUS CATHETER  FLUOROSCOPY TIME:  None  MEDICATIONS AND MEDICAL HISTORY: Versed 1.5 mg, Fentanyl 50 mcg.  Additional Medications: None.  ANESTHESIA/SEDATION: Moderate sedation time: 15 minutes  CONTRAST:  None  PROCEDURE: The procedure, risks, benefits, and alternatives were explained to the patient. Questions regarding the procedure were encouraged and answered. The patient understands and consents to the procedure.  The right upper quadrant was prepped with Betadine in a sterile fashion, and a sterile drape was applied covering the operative field. A sterile gown and sterile gloves were used for the procedure.  Under CT guidance, an 18 gauge needle was inserted into the peritoneal perihepatic fluid collection and removed over an Amplatz. A 12 French dilator followed by a 12 French abscess strain were inserted and looped and string fixed.  FINDINGS: Images document needle placement in the peritoneal fluid about the liver.  COMPLICATIONS: None  IMPRESSION: Successful peritoneal fluid drain about the liver.   Electronically Signed   By: Maryclare Bean M.D.   On: 10/30/2014 15:55     Labs:  CBC:  Recent Labs  10/31/14 0458 11/01/14 0446 11/02/14 0538 11/03/14 0557  WBC 12.0* 12.7* 11.3* 8.5  HGB 11.3* 10.1* 11.1* 9.8*  HCT 34.5* 30.7* 33.1* 29.2*  PLT 314 313 351 422*    COAGS:  Recent Labs  10/30/14 0420  INR 1.11  APTT 30    BMP:  Recent Labs  10/31/14 1602 11/01/14 0446 11/02/14 0538 11/03/14 0557  NA 133* 130* 130* 129*  K 4.2 4.0 3.8 3.6*  CL 99 98 96 96  CO2 21 20 22 21   GLUCOSE 99 68* 88 119*  BUN 8 6 3* <3*  CALCIUM 7.7* 7.2* 7.3* 7.2*  CREATININE 0.49* 0.42* 0.43* 0.43*  GFRNONAA >90 >90 >90 >90  GFRAA >90 >90 >90 >90    LIVER FUNCTION TESTS:  Recent Labs  10/27/14 0519 10/29/14 0433 10/30/14 0420 10/31/14 0458  BILITOT 0.6 0.4 0.6 0.7  AST 20 37 18 17  ALT 11 60* 41* 25  ALKPHOS 57 54 52 36*  PROT 7.0 6.3 6.3 5.5*  ALBUMIN 3.5 2.7* 2.5* 2.2*    Assessment and Plan: S/p drainage of perihepatic/peritoneal fluid collection 11/11 (post cholecystectomy);  Still with simple serous output, per CCS recs, drain can likely be removed prior to dc tomorrow.   SignedAscencion Dike 11/03/2014, 9:53 AM

## 2014-11-03 NOTE — Progress Notes (Signed)
Patient ID: Terri Shah, female   DOB: 03/30/1934, 80 y.o.   MRN: [id number]  [facility] PROGRESS NOTE  LENZIE MONTESANO STM:[id number] DOB: [birth year] DOA: 10/26/2014 PCP: Precious Reel, MD   Brief narrative: Patient was admitted 10/26/2014 secondary to abdominal discomfort associated with cholecystitis. Her gastroenterologist referred patient to the surgeons for evaluation. During her hospital stay patient has had low sodium levels and as a result we have been consulted for medical evaluation recommendations.  Assessment and Plan:    Principal Problem:  Acute calculous cholecystitis - Post op ileus and fluid collection - management per primary team  - Zosyn provided for 5 days, now off  Active Problems:  Hyponatremia - trending: 127 --> 128 --> 133 --> 130 --> 129 this AM - now off IVF, I think this will improve once oral intake improves - pt is clinically stable, normal mental status   Hypophosphatemia - supplemented  Leukocytosis - secondary to the above - now resolved and off ABX  Barrett esophagus - stable, continue Protonix   History of GI diverticular bleed - drop in Hg over the past 24 hours: 11.3 --> 10.1 --> 9.8 - no clear active bleeding reported  Hypertension - slightly above target range - continue Benazepril and add Hydralazine IV as needed for SBP > 155   HLD - continue statin   DVT prophylaxis  Heparin SQ while pt is in hospital  Code Status: Full Family Communication: Pt and family at bedside Disposition Plan: Remains inpatient   IV Access:    Peripheral IV Procedures and diagnostic studies:    Dg Abd Portable 1v 10/31/2014 Persistent small bowel obstruction.   Ct Image Guided Drainage By Percutaneous Catheter 10/30/2014 Successful peritoneal fluid drain about the liver.  Medical Consultants:    [facility] Anti-Infectives:    Zosyn 11/10 --> 11/14  Faye Ramsay, MD  [facility] Pager [phone]  If 7PM-7AM,  please contact night-coverage [email or web address] Password [email or web address] 11/03/2014, 1:30 PM   LOS: 8 days   HPI/Subjective: No events overnight.   Objective: Filed Vitals:   11/02/14 1043 11/02/14 1400 11/02/14 2125 11/03/14 0516  BP: 156/82 158/79 177/78 148/72  Pulse: 78 81 77 71  Temp: 98.3 F (36.8 C) 98.3 F (36.8 C) 98.8 F (37.1 C) 98.4 F (36.9 C)  TempSrc: Oral Oral Oral Oral  Resp: 18 16 17 16   Height:      Weight:      SpO2: 99% 97% 97% 98%    Intake/Output Summary (Last 24 hours) at 11/03/14 1330 Last data filed at 11/03/14 0844  Gross per 24 hour  Intake 1120.83 ml  Output   2605 ml  Net -1484.17 ml    Exam:   General:  Pt is alert, follows commands appropriately, not in acute distress  Cardiovascular: Regular rate and rhythm, S1/S2, no murmurs, no rubs, no gallops  Respiratory: Clear to auscultation bilaterally, no wheezing, no crackles, no rhonchi  Abdomen: Soft, non tender, non distended, bowel sounds present, no guarding  Data Reviewed: Basic Metabolic Panel:  Recent Labs Lab 10/31/14 0458 10/31/14 1602 11/01/14 0446 11/02/14 0538 11/03/14 0557  NA 128* 133* 130* 130* 129*  K 4.4 4.2 4.0 3.8 3.6*  CL 96 99 98 96 96  CO2 21 21 20 22 21   GLUCOSE 101* 99 68* 88 119*  BUN 9 8 6  3* <3*  CREATININE 0.48* 0.49* 0.42* 0.43* 0.43*  CALCIUM 7.6* 7.7* 7.2* 7.3* 7.2*  MG  --   --  1.9 2.0  --  PHOS  --   --  1.4* 1.7*  --    Liver Function Tests:  Recent Labs Lab 10/29/14 0433 10/30/14 0420 10/31/14 0458  AST 37 18 17  ALT 60* 41* 25  ALKPHOS 54 52 36*  BILITOT 0.4 0.6 0.7  PROT 6.3 6.3 5.5*  ALBUMIN 2.7* 2.5* 2.2*   CBC:  Recent Labs Lab 10/30/14 0420 10/31/14 0458 11/01/14 0446 11/02/14 0538 11/03/14 0557  WBC 16.2* 12.0* 12.7* 11.3* 8.5  HGB 12.6 11.3* 10.1* 11.1* 9.8*  HCT 37.4 34.5* 30.7* 33.1* 29.2*  MCV 89.3 90.8 91.4 90.7 90.1  PLT 325 314 313 351 422*    Recent Results (from the past 240 hour(s))  Urine culture      Status: None   Collection Time: 10/26/14  4:52 PM  Result Value Ref Range Status   Specimen Description URINE, CLEAN CATCH  Final   Special Requests Normal  Final   Culture  Setup Time   Final    10/26/2014 22:07 Performed at [place]   Final    15,000 COLONIES/ML Performed at [facility]    Culture   Final    Multiple bacterial morphotypes present, none predominant. Suggest appropriate recollection if clinically indicated. Performed at [facility]    Report Status 10/28/2014 FINAL  Final  Surgical pcr screen     Status: None   Collection Time: 10/26/14 10:50 PM  Result Value Ref Range Status   MRSA, PCR NEGATIVE NEGATIVE Final   Staphylococcus aureus NEGATIVE NEGATIVE Final    Comment:        The Xpert SA Assay (FDA approved for NASAL specimens in patients over 30 years of age), is one component of a comprehensive surveillance program.  Test performance has been validated by [employer] for patients greater than or equal to 48 year old. It is not intended to diagnose infection nor to guide or monitor treatment.   Anaerobic culture     Status: None (Preliminary result)   Collection Time: 10/30/14  3:44 PM  Result Value Ref Range Status   Specimen Description VITREOUS FLUID  Final   Special Requests NONE  Final   Gram Stain   Final    MODERATE WBC PRESENT,BOTH PMN AND MONONUCLEAR NO ORGANISMS SEEN Performed at [facility]    Culture   Final    NO ANAEROBES ISOLATED; CULTURE IN PROGRESS FOR 5 DAYS Performed at [facility]    Report Status PENDING  Incomplete  Body fluid culture     Status: None (Preliminary result)   Collection Time: 10/30/14  3:44 PM  Result Value Ref Range Status   Specimen Description VITREOUS FLUID  Final   Special Requests NONE  Final   Gram Stain   Final    MODERATE WBC PRESENT,BOTH PMN AND MONONUCLEAR NO ORGANISMS SEEN Performed at [facility]    Culture   Final     NO GROWTH 2 DAYS Performed at [facility]    Report Status PENDING  Incomplete     Scheduled Meds: . amitriptyline  25 mg Oral QHS  . atorvastatin  20 mg Oral q1800  . benazepril  20 mg Oral Daily  . brimonidine  1 drop Right Eye Q12H  . cholecalciferol  1,000 Units Oral Daily  . feeding supplement (ENSURE)  1 Container Oral TID BM  . heparin  5,000 Units Subcutaneous 3 times per day  . lip balm  1  application Topical BID  . pantoprazole  40 mg Oral Daily  . saccharomyces boulardii  250 mg Oral BID  . timolol  1 drop Right Eye Q12H   Continuous Infusions:

## 2014-11-03 NOTE — Plan of Care (Signed)
Problem: Phase III Progression Outcomes Goal: IV changed to normal saline lock Outcome: Completed/Met Date Met:  11/03/14 Goal: Discharge plan remains appropriate-arrangements made Outcome: Completed/Met Date Met:  11/03/14  Problem: Discharge Progression Outcomes Goal: Barriers To Progression Addressed/Resolved Outcome: Completed/Met Date Met:  11/03/14

## 2014-11-03 NOTE — Progress Notes (Signed)
7 Days Post-Op  Subjective: Feels better, did well with fulls, no n/v, ambulating  Objective: Vital signs in last 24 hours: Temp:  [98.3 F (36.8 C)-98.8 F (37.1 C)] 98.4 F (36.9 C) (11/15 0516) Pulse Rate:  [71-81] 71 (11/15 0516) Resp:  [16-18] 16 (11/15 0516) BP: (148-177)/(72-82) 148/72 mmHg (11/15 0516) SpO2:  [97 %-99 %] 98 % (11/15 0516) Last BM Date: 11/02/14  Intake/Output from previous day: 11/14 0701 - 11/15 0700 In: 1300.8 [I.V.:1140.8; IV Piggyback:150] Out: 2855 [Urine:2650; Drains:205] Intake/Output this shift: Total I/O In: 120 [P.O.:120] Out: -   General appearance: no distress Resp: clear to auscultation bilaterally Cardio: regular rate and rhythm GI: soft wounds clean drain serous bs present  Lab Results:   Recent Labs  11/02/14 0538 11/03/14 0557  WBC 11.3* 8.5  HGB 11.1* 9.8*  HCT 33.1* 29.2*  PLT 351 422*   BMET  Recent Labs  11/02/14 0538 11/03/14 0557  NA 130* 129*  K 3.8 3.6*  CL 96 96  CO2 22 21  GLUCOSE 88 119*  BUN 3* <3*  CREATININE 0.43* 0.43*  CALCIUM 7.3* 7.2*   PT/INR No results for input(s): LABPROT, INR in the last 72 hours. ABG No results for input(s): PHART, HCO3 in the last 72 hours.  Invalid input(s): PCO2, PO2  Studies/Results: No results found.  Anti-infectives: Anti-infectives    Start     Dose/Rate Route Frequency Ordered Stop   10/29/14 1615  piperacillin-tazobactam (ZOSYN) IVPB 3.375 g  Status:  Discontinued     3.375 g12.5 mL/hr over 240 Minutes Intravenous Every 8 hours 10/29/14 1602 11/03/14 0914   10/26/14 2115  cefTRIAXone (ROCEPHIN) 2 g in dextrose 5 % 50 mL IVPB  Status:  Discontinued    Comments:  Pharmacy may adjust dosing strength / duration / interval for maximal efficacy   2 g100 mL/hr over 30 Minutes Intravenous Every 24 hours 10/26/14 2106 10/29/14 1602      Assessment/Plan: POD 7 lap chole  Ileus appears resolving, soft diet today Po pain meds Dc abx today Drain likely can  be removed by IR prior to dc hopefully tomorrow, culture negative so far Check na in am again, appreciate im   [facility] 11/03/2014

## 2014-11-03 NOTE — Plan of Care (Signed)
Problem: Phase II Progression Outcomes Goal: Return of bowel function (flatus, BM) IF ABDOMINAL SURGERY:  Outcome: Completed/Met Date Met:  11/03/14

## 2014-11-03 NOTE — Plan of Care (Signed)
Problem: Phase III Progression Outcomes Goal: Pain controlled on oral analgesia Outcome: Completed/Met Date Met:  11/03/14

## 2014-11-04 DIAGNOSIS — K567 Ileus, unspecified: Secondary | ICD-10-CM | POA: Diagnosis not present

## 2014-11-04 DIAGNOSIS — E871 Hypo-osmolality and hyponatremia: Secondary | ICD-10-CM

## 2014-11-04 DIAGNOSIS — K9189 Other postprocedural complications and disorders of digestive system: Secondary | ICD-10-CM

## 2014-11-04 LAB — ANAEROBIC CULTURE

## 2014-11-04 LAB — BASIC METABOLIC PANEL
Anion gap: 10 (ref 5–15)
BUN: <3 mg/dL — ABNORMAL LOW (ref 6–23)
CO2: 24 mEq/L (ref 19–32)
Calcium: 7.6 mg/dL — ABNORMAL LOW (ref 8.4–10.5)
Chloride: 95 mEq/L — ABNORMAL LOW (ref 96–112)
Creatinine, Ser: 0.43 mg/dL — ABNORMAL LOW (ref 0.50–1.10)
GFR calc Af Amer: >90 mL/min (ref >90)
GFR calc non Af Amer: >90 mL/min (ref >90)
Glucose, Bld: 93 mg/dL (ref 70–99)
Potassium: 3.3 mEq/L — ABNORMAL LOW (ref 3.7–5.3)
Sodium: 129 mEq/L — ABNORMAL LOW (ref 137–147)

## 2014-11-04 NOTE — Progress Notes (Signed)
CARE MANAGEMENT NOTE 11/04/2014  Patient:  Terri Shah, Terri Shah   Account Number:  [id number]  Date Initiated:  11/01/2014  Documentation initiated by:  Sunday Spillers  Subjective/Objective Assessment:   80 yo female admitted s/p lap chole and lysis of adhesions. PTA lived at home alone.     Action/Plan:   Hom when stable, son and daughter involved in care   Anticipated DC Date:  11/04/2014   Anticipated DC Plan:  [place]  CM consult      Choice offered to / List presented to:             Status of service:  Completed, signed off Medicare Important Message given?  YES (If response is "NO", the following Medicare IM given date fields will be blank) Date Medicare IM given:  11/04/2014 Medicare IM given by:  [facility] Date Additional Medicare IM given:   Additional Medicare IM given by:    Discharge Disposition:  HOME/SELF CARE  Per UR Regulation:  Reviewed for med. necessity/level of care/duration of stay  If discussed at [place] of Stay Meetings, dates discussed:    Comments:  11/04/14 Delbert Darley RN,BSN NCM [phone] D/C HOME NO Colon.

## 2014-11-04 NOTE — Progress Notes (Signed)
Referring Physician(s): CCS  Subjective: Patient denies any fever or chills, denies any worsening abdominal pain, and states she is tolerating diet without N/V.  Allergies: Codeine  Medications: Prior to Admission medications   Medication Sig Start Date End Date Taking? Authorizing Provider  acetaminophen (TYLENOL) 500 MG tablet Take 500 mg by mouth every 6 (six) hours as needed. Patient used this medication for her pain.   Yes Historical Provider, MD  amitriptyline (ELAVIL) 25 MG tablet Take 25 mg by mouth at bedtime.    Yes Historical Provider, MD  atorvastatin (LIPITOR) 20 MG tablet Take 20 mg by mouth daily.     Yes Historical Provider, MD  benazepril (LOTENSIN) 20 MG tablet Take 20 mg by mouth daily.     Yes Historical Provider, MD  brimonidine-timolol (COMBIGAN) 0.2-0.5 % ophthalmic solution Place 1 drop into the right eye every 12 (twelve) hours.   Yes Historical Provider, MD  cholecalciferol (VITAMIN D) 1000 UNITS tablet Take 1,000 Units by mouth daily.     Yes Historical Provider, MD  hydrochlorothiazide (,MICROZIDE/HYDRODIURIL,) 12.5 MG capsule Take 12.5 mg by mouth daily. Take 1 tab every other day.   Yes Historical Provider, MD  omeprazole (PRILOSEC) 20 MG capsule Take 20 mg by mouth daily.    Yes Historical Provider, MD  Psyllium (METAMUCIL PO) Take 1 each by mouth at bedtime.   Yes Historical Provider, MD  traMADol (ULTRAM) 50 MG tablet Take 50 mg by mouth every 6 (six) hours as needed for moderate pain.    Yes Historical Provider, MD  denosumab (PROLIA) 60 MG/ML SOLN injection Inject 60 mg into the skin every 6 (six) months. Administer in upper arm, thigh, or abdomen    Historical Provider, MD    Review of Systems  Vital Signs: BP 150/62 mmHg  Pulse 75  Temp(Src) 98.6 F (37 C) (Oral)  Resp 16  Ht 5\' 1"  (1.549 m)  Wt 126 lb (57.153 kg)  BMI 23.82 kg/m2  SpO2 98%  Physical Exam General: A&Ox3, NAD Abd: Soft, NT, ND, RUQ drain C/D/I, minimal 24 hour serous  output  Imaging: Dg Abd Portable 1v  10/31/2014   CLINICAL DATA:  Abdominal distension, small bowel obstruction  EXAM: PORTABLE ABDOMEN - 1 VIEW  COMPARISON:  CT abdomen and pelvis 10/29/2014  FINDINGS: Peritoneal drain and RIGHT upper quadrant.  Persistent dilatation of small bowel loops throughout the abdomen.  Small amount retained contrast within small bowel loops in the LEFT mid abdomen.  No significant colonic gas.  No definite bowel wall thickening.  Bones demineralized with degenerative changes and scoliosis lumbar spine.  IMPRESSION: Persistent small bowel obstruction.   Electronically Signed   By: Lavonia Dana M.D.   On: 10/31/2014 16:31    Labs:  CBC:  Recent Labs  10/31/14 0458 11/01/14 0446 11/02/14 0538 11/03/14 0557  WBC 12.0* 12.7* 11.3* 8.5  HGB 11.3* 10.1* 11.1* 9.8*  HCT 34.5* 30.7* 33.1* 29.2*  PLT 314 313 351 422*    COAGS:  Recent Labs  10/30/14 0420  INR 1.11  APTT 30    BMP:  Recent Labs  11/01/14 0446 11/02/14 0538 11/03/14 0557 11/04/14 0403  NA 130* 130* 129* 129*  K 4.0 3.8 3.6* 3.3*  CL 98 96 96 95*  CO2 20 22 21 24   GLUCOSE 68* 88 119* 93  BUN 6 3* <3* <3*  CALCIUM 7.2* 7.3* 7.2* 7.6*  CREATININE 0.42* 0.43* 0.43* 0.43*  GFRNONAA >90 >90 >90 >90  GFRAA >90 >90 >  90 >90    LIVER FUNCTION TESTS:  Recent Labs  10/27/14 0519 10/29/14 0433 10/30/14 0420 10/31/14 0458  BILITOT 0.6 0.4 0.6 0.7  AST 20 37 18 17  ALT 11 60* 41* 25  ALKPHOS 57 54 52 36*  PROT 7.0 6.3 6.3 5.5*  ALBUMIN 3.5 2.7* 2.5* 2.2*    Assessment and Plan: Perihepatic fluid collection s/p cholecystectomy  S/p perc drain placed 11/11, minimal serous output, wbc nl, afebrile. Post operative Ileus resolved.  Patient ready for discharge home and request for drain removal. Successful RUQ drain removed, no immediate complications, sterile dressing placed and may be removed in 24 hours.     I spent a total of 15 minutes face to face in clinical  consultation/evaluation, greater than 50% of which was counseling/coordinating care  Signed: Hedy Jacob 11/04/2014, 12:03 PM

## 2014-11-04 NOTE — Progress Notes (Signed)
8 Days Post-Op  Subjective: Ileus has resolved she is eating normally and ready to go home.  Objective: Vital signs in last 24 hours: Temp:  [98.2 F (36.8 C)-98.6 F (37 C)] 98.6 F (37 C) (11/16 0612) Pulse Rate:  [75-80] 75 (11/16 0612) Resp:  [16-18] 16 (11/16 0612) BP: (150-168)/(62-77) 150/62 mmHg (11/16 0612) SpO2:  [98 %-100 %] 98 % (11/16 0612) Last BM Date: 11/04/14 1080 PO Soft diet Afebrile, VSS NA 129 -  No films last WBC was normal off abx Intake/Output from previous day: 11/15 0701 - 11/16 0700 In: 1095 [P.O.:1080] Out: 3380 [Urine:3200; Drains:180] Intake/Output this shift: Total I/O In: -  Out: 310 [Urine:300; Drains:10]  General appearance: alert, cooperative and no distress Resp: clear to auscultation bilaterally GI: soft, non-tender; bowel sounds normal; no masses,  no organomegaly Port site looks good.  Waiting on drain to be removed. Lab Results:   Recent Labs  11/02/14 0538 11/03/14 0557  WBC 11.3* 8.5  HGB 11.1* 9.8*  HCT 33.1* 29.2*  PLT 351 422*    BMET  Recent Labs  11/03/14 0557 11/04/14 0403  NA 129* 129*  K 3.6* 3.3*  CL 96 95*  CO2 21 24  GLUCOSE 119* 93  BUN <3* <3*  CREATININE 0.43* 0.43*  CALCIUM 7.2* 7.6*   PT/INR No results for input(s): LABPROT, INR in the last 72 hours.   Recent Labs Lab 10/29/14 0433 10/30/14 0420 10/31/14 0458  AST 37 18 17  ALT 60* 41* 25  ALKPHOS 54 52 36*  BILITOT 0.4 0.6 0.7  PROT 6.3 6.3 5.5*  ALBUMIN 2.7* 2.5* 2.2*     Lipase     Component Value Date/Time   LIPASE 29 10/26/2014 1540     Studies/Results: No results found.  Medications: . amitriptyline  25 mg Oral QHS  . atorvastatin  20 mg Oral q1800  . benazepril  20 mg Oral Daily  . brimonidine  1 drop Right Eye Q12H  . cholecalciferol  1,000 Units Oral Daily  . feeding supplement (ENSURE)  1 Container Oral TID BM  . heparin  5,000 Units Subcutaneous 3 times per day  . lip balm  1 application Topical BID  .  pantoprazole  40 mg Oral Daily  . saccharomyces boulardii  250 mg Oral BID  . timolol  1 drop Right Eye Q12H    Assessment/Plan Acute calculous cholecystitis LAPAROSCOPIC CHOLECYSTECTOMY SINGLE SITE WITH INTRAOPERATIVE CHOLANGIOGRAM,LAPAROSCOPIC LYSIS OF ADHESIONS WEDGE LIVER BIOPSY, Dr. Michael Boston, 10/27/14. Post op ileus and fluid collection  GERD  Chest pain  Barrett esophagus  History of GI diverticular bleed  Hypertension Fibromyalgia  Hyponatremia, low phosphate, phosphate being replaced.  Plan:  Home later today after drain is removed     LOS: 9 days    Terri Shah 11/04/2014

## 2014-11-04 NOTE — Progress Notes (Signed)
Patient is alert and oriented, vital signs are stable, incisions are within normal limits, patient is tolerating his diet without complaints of nausea or vomiting, discharge instructions reviewed with patient, drain removed by IR prior to discharge, questions and concerns answered Neta Mends RN 11-04-2014 13:04pm

## 2014-11-06 LAB — TOTAL BILIRUBIN, BODY FLUID: Total bilirubin, fluid: 0.6 mg/dL

## 2014-11-25 ENCOUNTER — Telehealth: Payer: Self-pay | Admitting: Internal Medicine

## 2014-11-25 NOTE — Telephone Encounter (Signed)
Patient states she had a lap chole on 10/27/14 and was in hospital for 8 days after due to ileus. She states she has been doing pretty good. This weekend on Friday and Saturday she had nausea and vomiting. No problems on Sunday. She states she would like to be seen because she does not feel she is getting back to normal. Scheduled with Alonza Bogus, PA on 11/27/14 at 10:30 AM.

## 2014-11-25 NOTE — Telephone Encounter (Signed)
Left a message for patient to call back. 

## 2014-11-25 NOTE — Telephone Encounter (Signed)
Pt said she was returning your call [phone]

## 2014-11-25 NOTE — Telephone Encounter (Signed)
I agree with her seeing Janett Billow.

## 2014-11-27 ENCOUNTER — Ambulatory Visit (INDEPENDENT_AMBULATORY_CARE_PROVIDER_SITE_OTHER)
Admission: RE | Admit: 2014-11-27 | Discharge: 2014-11-27 | Disposition: A | Discharge status: Home / Self Care | Payer: Commercial Managed Care - HMO | Source: Ambulatory Visit | Attending: Gastroenterology | Admitting: Gastroenterology

## 2014-11-27 ENCOUNTER — Encounter: Payer: Self-pay | Admitting: Gastroenterology

## 2014-11-27 ENCOUNTER — Ambulatory Visit (INDEPENDENT_AMBULATORY_CARE_PROVIDER_SITE_OTHER): Payer: Commercial Managed Care - HMO | Admitting: Gastroenterology

## 2014-11-27 VITALS — BP 144/74 | HR 72 | Ht 61.0 in | Wt 123.1 lb

## 2014-11-27 DIAGNOSIS — R1110 Vomiting, unspecified: Secondary | ICD-10-CM

## 2014-11-27 NOTE — Patient Instructions (Signed)
Please go to the basement for an x-ray before leaving today

## 2014-12-02 ENCOUNTER — Encounter: Payer: Self-pay | Admitting: Gastroenterology

## 2014-12-02 DIAGNOSIS — R112 Nausea with vomiting, unspecified: Secondary | ICD-10-CM | POA: Insufficient documentation

## 2014-12-02 NOTE — Progress Notes (Signed)
     12/02/2014 Terri Shah [id number] [birth year]   History of Present Illness:  This is a pleasant 80 year old female who is known to Dr. Olevia Perches.  She underwent lap cholecystectomy with IOC, LOA, and wedge liver biopsy on 11/8 by Dr. Johney [place].  This was complicated by an ileus/PSBO.  Pathology showed acute cholecystitis and cholelithiasis.  She says that she was doing well, but then this past weekend she had sudden nausea and vomiting.  Vomited large amounts.  She has now been feeling better, but is asking what she can eat.  Denies abdominal pain.  Is moving her bowels and passing flatus.  Says that she is frustrated with the surgeon because he really did not answer her questions regarding diet, etc.   Current Medications, Allergies, Past Medical History, Past Surgical History, Family History and Social History were reviewed in [employer] record.   Physical Exam: BP 144/74 mmHg  Pulse 72  Ht 5\' 1"  (1.549 m)  Wt 123 lb 2 oz (55.849 kg)  BMI 23.28 kg/m2 General: Well developed white female in no acute distress Head: Normocephalic and atraumatic Eyes:  Sclerae anicteric, conjunctiva pink  Ears: Normal auditory acuity Lungs: Clear throughout to auscultation Heart: Regular rate and rhythm Abdomen: Soft, non-distended.  Normal bowel sounds.  Non-tender. Musculoskeletal: Symmetrical with no gross deformities  Extremities: No edema  Neurological: Alert oriented x 4, grossly non-focal Psychological:  Alert and cooperative. Normal mood and affect  Assessment and Recommendations: -Episode of nausea and vomiting a few days ago that has not resolved in a patient with recent cholecystectomy complicated by ileus/PSBO.  Although symptoms have about resolved at this point we will check a 2 view abdominal x-ray.  She is asking about what she should eat and says that she was not given much instruction from the surgeon.  Advised to drink plenty of liquids.  Avoid fatty/greasy  foods and very high fiber foods/roughage for a while.

## 2014-12-02 NOTE — Progress Notes (Signed)
Reviewed and agree. I would keep her on very bland diet, low roughage while the ileus is resolving.May take Peptobismol prn diarrhea/gas.

## 2015-01-28 ENCOUNTER — Other Ambulatory Visit: Payer: Self-pay | Admitting: Internal Medicine

## 2015-01-29 ENCOUNTER — Other Ambulatory Visit: Payer: Self-pay | Admitting: Internal Medicine

## 2015-01-29 DIAGNOSIS — R202 Paresthesia of skin: Secondary | ICD-10-CM

## 2015-02-01 ENCOUNTER — Ambulatory Visit (HOSPITAL_BASED_OUTPATIENT_CLINIC_OR_DEPARTMENT_OTHER)
Admission: RE | Admit: 2015-02-01 | Discharge: 2015-02-01 | Disposition: A | Discharge status: Home / Self Care | Payer: Commercial Managed Care - HMO | Source: Ambulatory Visit | Attending: Internal Medicine | Admitting: Internal Medicine

## 2015-02-01 DIAGNOSIS — M47892 Other spondylosis, cervical region: Secondary | ICD-10-CM | POA: Diagnosis not present

## 2015-02-01 DIAGNOSIS — R202 Paresthesia of skin: Secondary | ICD-10-CM

## 2015-02-01 DIAGNOSIS — I10 Essential (primary) hypertension: Secondary | ICD-10-CM | POA: Diagnosis not present

## 2015-02-01 DIAGNOSIS — G319 Degenerative disease of nervous system, unspecified: Secondary | ICD-10-CM | POA: Insufficient documentation

## 2015-02-01 MED ORDER — GADOBENATE DIMEGLUMINE 529 MG/ML IV SOLN: 10.0000 mL | Freq: Once | INTRAVENOUS | Status: DC

## 2015-02-03 ENCOUNTER — Other Ambulatory Visit (HOSPITAL_BASED_OUTPATIENT_CLINIC_OR_DEPARTMENT_OTHER): Payer: Self-pay | Admitting: Internal Medicine

## 2015-02-03 DIAGNOSIS — M4802 Spinal stenosis, cervical region: Secondary | ICD-10-CM

## 2015-02-05 ENCOUNTER — Ambulatory Visit (HOSPITAL_BASED_OUTPATIENT_CLINIC_OR_DEPARTMENT_OTHER)
Admission: RE | Admit: 2015-02-05 | Discharge: 2015-02-05 | Disposition: A | Discharge status: Home / Self Care | Payer: Commercial Managed Care - HMO | Source: Ambulatory Visit | Attending: Internal Medicine | Admitting: Internal Medicine

## 2015-02-05 DIAGNOSIS — M2578 Osteophyte, vertebrae: Secondary | ICD-10-CM | POA: Insufficient documentation

## 2015-02-05 DIAGNOSIS — M1288 Other specific arthropathies, not elsewhere classified, other specified site: Secondary | ICD-10-CM | POA: Insufficient documentation

## 2015-02-05 DIAGNOSIS — M4802 Spinal stenosis, cervical region: Secondary | ICD-10-CM | POA: Diagnosis not present

## 2015-02-05 DIAGNOSIS — M47892 Other spondylosis, cervical region: Secondary | ICD-10-CM | POA: Diagnosis not present

## 2015-02-05 DIAGNOSIS — M542 Cervicalgia: Secondary | ICD-10-CM | POA: Diagnosis present

## 2015-02-12 ENCOUNTER — Other Ambulatory Visit: Payer: Commercial Managed Care - HMO

## 2015-03-18 ENCOUNTER — Other Ambulatory Visit (HOSPITAL_COMMUNITY): Payer: Self-pay | Admitting: Internal Medicine

## 2015-03-18 ENCOUNTER — Ambulatory Visit (HOSPITAL_COMMUNITY)
Admission: RE | Admit: 2015-03-18 | Discharge: 2015-03-18 | Disposition: A | Discharge status: Home / Self Care | Payer: Commercial Managed Care - HMO | Source: Ambulatory Visit | Attending: Internal Medicine | Admitting: Internal Medicine

## 2015-03-18 ENCOUNTER — Encounter (HOSPITAL_COMMUNITY): Payer: Self-pay

## 2015-03-18 DIAGNOSIS — M810 Age-related osteoporosis without current pathological fracture: Secondary | ICD-10-CM | POA: Diagnosis not present

## 2015-03-18 MED ORDER — DENOSUMAB 60 MG/ML ~~LOC~~ SOLN
60.0000 mg | Freq: Once | SUBCUTANEOUS | Status: AC
  Administered 2015-03-18: 60 mg via SUBCUTANEOUS
  Filled 2015-03-18: qty 1

## 2015-03-18 NOTE — Discharge Instructions (Signed)
Denosumab injection What is this medicine? DENOSUMAB (den oh sue mab) slows bone breakdown. Prolia is used to treat osteoporosis in women after menopause and in men. Xgeva is used to prevent bone fractures and other bone problems caused by cancer bone metastases. Xgeva is also used to treat giant cell tumor of the bone. This medicine may be used for other purposes; ask your health care provider or pharmacist if you have questions. COMMON BRAND NAME(S): Prolia, XGEVA What should I tell my health care provider before I take this medicine? They need to know if you have any of these conditions: -dental disease -eczema -infection or history of infections -kidney disease or on dialysis -low blood calcium or vitamin D -malabsorption syndrome -scheduled to have surgery or tooth extraction -taking medicine that contains denosumab -thyroid or parathyroid disease -an unusual reaction to denosumab, other medicines, foods, dyes, or preservatives -pregnant or trying to get pregnant -breast-feeding How should I use this medicine? This medicine is for injection under the skin. It is given by a health care professional in a hospital or clinic setting. If you are getting Prolia, a special MedGuide will be given to you by the pharmacist with each prescription and refill. Be sure to read this information carefully each time. For Prolia, talk to your pediatrician regarding the use of this medicine in children. Special care may be needed. For Xgeva, talk to your pediatrician regarding the use of this medicine in children. While this drug may be prescribed for children as young as 13 years for selected conditions, precautions do apply. Overdosage: If you think you've taken too much of this medicine contact a poison control center or emergency room at once. Overdosage: If you think you have taken too much of this medicine contact a poison control center or emergency room at once. NOTE: This medicine is only for  you. Do not share this medicine with others. What if I miss a dose? It is important not to miss your dose. Call your doctor or health care professional if you are unable to keep an appointment. What may interact with this medicine? Do not take this medicine with any of the following medications: -other medicines containing denosumab This medicine may also interact with the following medications: -medicines that suppress the immune system -medicines that treat cancer -steroid medicines like prednisone or cortisone This list may not describe all possible interactions. Give your health care provider a list of all the medicines, herbs, non-prescription drugs, or dietary supplements you use. Also tell them if you smoke, drink alcohol, or use illegal drugs. Some items may interact with your medicine. What should I watch for while using this medicine? Visit your doctor or health care professional for regular checks on your progress. Your doctor or health care professional may order blood tests and other tests to see how you are doing. Call your doctor or health care professional if you get a cold or other infection while receiving this medicine. Do not treat yourself. This medicine may decrease your body's ability to fight infection. You should make sure you get enough calcium and vitamin D while you are taking this medicine, unless your doctor tells you not to. Discuss the foods you eat and the vitamins you take with your health care professional. See your dentist regularly. Brush and floss your teeth as directed. Before you have any dental work done, tell your dentist you are receiving this medicine. Do not become pregnant while taking this medicine or for 5 months after stopping   it. Women should inform their doctor if they wish to become pregnant or think they might be pregnant. There is a potential for serious side effects to an unborn child. Talk to your health care professional or pharmacist for more  information. What side effects may I notice from receiving this medicine? Side effects that you should report to your doctor or health care professional as soon as possible: -allergic reactions like skin rash, itching or hives, swelling of the face, lips, or tongue -breathing problems -chest pain -fast, irregular heartbeat -feeling faint or lightheaded, falls -fever, chills, or any other sign of infection -muscle spasms, tightening, or twitches -numbness or tingling -skin blisters or bumps, or is dry, peels, or red -slow healing or unexplained pain in the mouth or jaw -unusual bleeding or bruising Side effects that usually do not require medical attention (Report these to your doctor or health care professional if they continue or are bothersome.): -muscle pain -stomach upset, gas This list may not describe all possible side effects. Call your doctor for medical advice about side effects. You may report side effects to FDA at 1-800-FDA-1088. Where should I keep my medicine? This medicine is only given in a clinic, doctor's office, or other health care setting and will not be stored at home. NOTE: This sheet is a summary. It may not cover all possible information. If you have questions about this medicine, talk to your doctor, pharmacist, or health care provider.  2015, [employer]. (2012-06-05 12:37:47)  

## 2015-03-24 ENCOUNTER — Encounter (HOSPITAL_BASED_OUTPATIENT_CLINIC_OR_DEPARTMENT_OTHER): Payer: Self-pay

## 2015-03-24 ENCOUNTER — Emergency Department (HOSPITAL_BASED_OUTPATIENT_CLINIC_OR_DEPARTMENT_OTHER)
Admission: EM | Admit: 2015-03-24 | Discharge: 2015-03-25 | Disposition: A | Discharge status: Home / Self Care | Payer: Commercial Managed Care - HMO | Attending: Emergency Medicine | Admitting: Emergency Medicine

## 2015-03-24 ENCOUNTER — Emergency Department (HOSPITAL_BASED_OUTPATIENT_CLINIC_OR_DEPARTMENT_OTHER): Payer: Commercial Managed Care - HMO

## 2015-03-24 DIAGNOSIS — S3992XA Unspecified injury of lower back, initial encounter: Secondary | ICD-10-CM | POA: Diagnosis not present

## 2015-03-24 DIAGNOSIS — Y998 Other external cause status: Secondary | ICD-10-CM | POA: Diagnosis not present

## 2015-03-24 DIAGNOSIS — Z8719 Personal history of other diseases of the digestive system: Secondary | ICD-10-CM | POA: Diagnosis not present

## 2015-03-24 DIAGNOSIS — S0990XA Unspecified injury of head, initial encounter: Secondary | ICD-10-CM | POA: Diagnosis present

## 2015-03-24 DIAGNOSIS — W06XXXA Fall from bed, initial encounter: Secondary | ICD-10-CM | POA: Diagnosis not present

## 2015-03-24 DIAGNOSIS — Y9289 Other specified places as the place of occurrence of the external cause: Secondary | ICD-10-CM | POA: Insufficient documentation

## 2015-03-24 DIAGNOSIS — Z8669 Personal history of other diseases of the nervous system and sense organs: Secondary | ICD-10-CM | POA: Diagnosis not present

## 2015-03-24 DIAGNOSIS — Y9389 Activity, other specified: Secondary | ICD-10-CM | POA: Insufficient documentation

## 2015-03-24 DIAGNOSIS — Z79899 Other long term (current) drug therapy: Secondary | ICD-10-CM | POA: Diagnosis not present

## 2015-03-24 DIAGNOSIS — S161XXA Strain of muscle, fascia and tendon at neck level, initial encounter: Secondary | ICD-10-CM | POA: Diagnosis not present

## 2015-03-24 DIAGNOSIS — Z862 Personal history of diseases of the blood and blood-forming organs and certain disorders involving the immune mechanism: Secondary | ICD-10-CM | POA: Insufficient documentation

## 2015-03-24 DIAGNOSIS — I10 Essential (primary) hypertension: Secondary | ICD-10-CM | POA: Diagnosis not present

## 2015-03-24 DIAGNOSIS — E785 Hyperlipidemia, unspecified: Secondary | ICD-10-CM | POA: Diagnosis not present

## 2015-03-24 DIAGNOSIS — M1990 Unspecified osteoarthritis, unspecified site: Secondary | ICD-10-CM | POA: Diagnosis not present

## 2015-03-24 MED ORDER — ACETAMINOPHEN 325 MG PO TABS
650.0000 mg | ORAL_TABLET | Freq: Once | ORAL | Status: DC
  Filled 2015-03-24: qty 2

## 2015-03-24 NOTE — Discharge Instructions (Signed)
Cervical Sprain °A cervical sprain is an injury in the neck in which the strong, fibrous tissues (ligaments) that connect your neck bones stretch or tear. Cervical sprains can range from mild to severe. Severe cervical sprains can cause the neck vertebrae to be unstable. This can lead to damage of the spinal cord and can result in serious nervous system problems. The amount of time it takes for a cervical sprain to get better depends on the cause and extent of the injury. Most cervical sprains heal in 1 to 3 weeks. °CAUSES  °Severe cervical sprains may be caused by:  °· Contact sport injuries (such as from football, rugby, wrestling, hockey, auto racing, gymnastics, diving, martial arts, or boxing).   °· Motor vehicle collisions.   °· Whiplash injuries. This is an injury from a sudden forward and backward whipping movement of the head and neck.  °· Falls.   °Mild cervical sprains may be caused by:  °· Being in an awkward position, such as while cradling a telephone between your ear and shoulder.   °· Sitting in a chair that does not offer proper support.   °· Working at a poorly designed computer station.   °· Looking up or down for long periods of time.   °SYMPTOMS  °· Pain, soreness, stiffness, or a burning sensation in the front, back, or sides of the neck. This discomfort may develop immediately after the injury or slowly, 24 hours or more after the injury.   °· Pain or tenderness directly in the middle of the back of the neck.   °· Shoulder or upper back pain.   °· Limited ability to move the neck.   °· Headache.   °· Dizziness.   °· Weakness, numbness, or tingling in the hands or arms.   °· Muscle spasms.   °· Difficulty swallowing or chewing.   °· Tenderness and swelling of the neck.   °DIAGNOSIS  °Most of the time your health care provider can diagnose a cervical sprain by taking your history and doing a physical exam. Your health care provider will ask about previous neck injuries and any known neck  problems, such as arthritis in the neck. X-rays may be taken to find out if there are any other problems, such as with the bones of the neck. Other tests, such as a CT scan or MRI, may also be needed.  °TREATMENT  °Treatment depends on the severity of the cervical sprain. Mild sprains can be treated with rest, keeping the neck in place (immobilization), and pain medicines. Severe cervical sprains are immediately immobilized. Further treatment is done to help with pain, muscle spasms, and other symptoms and may include: °· Medicines, such as pain relievers, numbing medicines, or muscle relaxants.   °· Physical therapy. This may involve stretching exercises, strengthening exercises, and posture training. Exercises and improved posture can help stabilize the neck, strengthen muscles, and help stop symptoms from returning.   °HOME CARE INSTRUCTIONS  °· Put ice on the injured area.   °¨ Put ice in a plastic bag.   °¨ Place a towel between your skin and the bag.   °¨ Leave the ice on for 15-20 minutes, 3-4 times a day.   °· If your injury was severe, you may have been given a cervical collar to wear. A cervical collar is a two-piece collar designed to keep your neck from moving while it heals. °¨ Do not remove the collar unless instructed by your health care provider. °¨ If you have long hair, keep it outside of the collar. °¨ Ask your health care provider before making any adjustments to your collar. Minor   adjustments may be required over time to improve comfort and reduce pressure on your chin or on the back of your head.  Ifyou are allowed to remove the collar for cleaning or bathing, follow your health care provider's instructions on how to do so safely.  Keep your collar clean by wiping it with mild soap and water and drying it completely. If the collar you have been given includes removable pads, remove them every 1-2 days and hand wash them with soap and water. Allow them to air dry. They should be completely  dry before you wear them in the collar.  If you are allowed to remove the collar for cleaning and bathing, wash and dry the skin of your neck. Check your skin for irritation or sores. If you see any, tell your health care provider.  Do not drive while wearing the collar.   Only take over-the-counter or prescription medicines for pain, discomfort, or fever as directed by your health care provider.   Keep all follow-up appointments as directed by your health care provider.   Keep all physical therapy appointments as directed by your health care provider.   Make any needed adjustments to your workstation to promote good posture.   Avoid positions and activities that make your symptoms worse.   Warm up and stretch before being active to help prevent problems.  SEEK MEDICAL CARE IF:   Your pain is not controlled with medicine.   You are unable to decrease your pain medicine over time as planned.   Your activity level is not improving as expected.  SEEK IMMEDIATE MEDICAL CARE IF:   You develop any bleeding.  You develop stomach upset.  You have signs of an allergic reaction to your medicine.   Your symptoms get worse.   You develop new, unexplained symptoms.   You have numbness, tingling, weakness, or paralysis in any part of your body.  MAKE SURE YOU:   Understand these instructions.  Will watch your condition.  Will get help right away if you are not doing well or get worse. Document Released: 10/03/2007 Document Revised: 12/11/2013 Document Reviewed: 06/13/2013 [facility] Patient Information 2015 [place], [place]. This information is not intended to replace advice given to you by your health care provider. Make sure you discuss any questions you have with your health care provider.  Concussion A concussion, or closed-head injury, is a brain injury caused by a direct blow to the head or by a quick and sudden movement (jolt) of the head or neck. Concussions are  usually not life-threatening. Even so, the effects of a concussion can be serious. If you have had a concussion before, you are more likely to experience concussion-like symptoms after a direct blow to the head.  CAUSES  Direct blow to the head, such as from running into another player during a soccer game, being hit in a fight, or hitting your head on a hard surface.  A jolt of the head or neck that causes the brain to move back and forth inside the skull, such as in a car crash. SIGNS AND SYMPTOMS The signs of a concussion can be hard to notice. Early on, they may be missed by you, family members, and health care providers. You may look fine but act or feel differently. Symptoms are usually temporary, but they may last for days, weeks, or even longer. Some symptoms may appear right away while others may not show up for hours or days. Every head injury is different. Symptoms  include:  Mild to moderate headaches that will not go away.  A feeling of pressure inside your head.  Having more trouble than usual:  Learning or remembering things you have heard.  Answering questions.  Paying attention or concentrating.  Organizing daily tasks.  Making decisions and solving problems.  Slowness in thinking, acting or reacting, speaking, or reading.  Getting lost or being easily confused.  Feeling tired all the time or lacking energy (fatigued).  Feeling drowsy.  Sleep disturbances.  Sleeping more than usual.  Sleeping less than usual.  Trouble falling asleep.  Trouble sleeping (insomnia).  Loss of balance or feeling lightheaded or dizzy.  Nausea or vomiting.  Numbness or tingling.  Increased sensitivity to:  Sounds.  Lights.  Distractions.  Vision problems or eyes that tire easily.  Diminished sense of taste or smell.  Ringing in the ears.  Mood changes such as feeling sad or anxious.  Becoming easily irritated or angry for little or no reason.  Lack of  motivation.  Seeing or hearing things other people do not see or hear (hallucinations). DIAGNOSIS Your health care provider can usually diagnose a concussion based on a description of your injury and symptoms. He or she will ask whether you passed out (lost consciousness) and whether you are having trouble remembering events that happened right before and during your injury. Your evaluation might include:  A brain scan to look for signs of injury to the brain. Even if the test shows no injury, you may still have a concussion.  Blood tests to be sure other problems are not present. TREATMENT  Concussions are usually treated in an emergency department, in urgent care, or at a clinic. You may need to stay in the hospital overnight for further treatment.  Tell your health care provider if you are taking any medicines, including prescription medicines, over-the-counter medicines, and natural remedies. Some medicines, such as blood thinners (anticoagulants) and aspirin, may increase the chance of complications. Also tell your health care provider whether you have had alcohol or are taking illegal drugs. This information may affect treatment.  Your health care provider will send you home with important instructions to follow.  How fast you will recover from a concussion depends on many factors. These factors include how severe your concussion is, what part of your brain was injured, your age, and how healthy you were before the concussion.  Most people with mild injuries recover fully. Recovery can take time. In general, recovery is slower in older persons. Also, persons who have had a concussion in the past or have other medical problems may find that it takes longer to recover from their current injury. HOME CARE INSTRUCTIONS General Instructions  Carefully follow the directions your health care provider gave you.  Only take over-the-counter or prescription medicines for pain, discomfort, or  fever as directed by your health care provider.  Take only those medicines that your health care provider has approved.  Do not drink alcohol until your health care provider says you are well enough to do so. Alcohol and certain other drugs may slow your recovery and can put you at risk of further injury.  If it is harder than usual to remember things, write them down.  If you are easily distracted, try to do one thing at a time. For example, do not try to watch TV while fixing dinner.  Talk with family members or close friends when making important decisions.  Keep all follow-up appointments. Repeated evaluation of  your symptoms is recommended for your recovery.  Watch your symptoms and tell others to do the same. Complications sometimes occur after a concussion. Older adults with a brain injury may have a higher risk of serious complications, such as a blood clot on the brain.  Tell your teachers, school nurse, school counselor, coach, athletic trainer, or work Freight forwarder about your injury, symptoms, and restrictions. Tell them about what you can or cannot do. They should watch for:  Increased problems with attention or concentration.  Increased difficulty remembering or learning new information.  Increased time needed to complete tasks or assignments.  Increased irritability or decreased ability to cope with stress.  Increased symptoms.  Rest. Rest helps the brain to heal. Make sure you:  Get plenty of sleep at night. Avoid staying up late at night.  Keep the same bedtime hours on weekends and weekdays.  Rest during the day. Take daytime naps or rest breaks when you feel tired.  Limit activities that require a lot of thought or concentration. These include:  Doing homework or job-related work.  Watching TV.  Working on the computer.  Avoid any situation where there is potential for another head injury (football, hockey, soccer, basketball, martial arts, downhill snow  sports and horseback riding). Your condition will get worse every time you experience a concussion. You should avoid these activities until you are evaluated by the appropriate follow-up health care providers. Returning To Your Regular Activities You will need to return to your normal activities slowly, not all at once. You must give your body and brain enough time for recovery.  Do not return to sports or other athletic activities until your health care provider tells you it is safe to do so.  Ask your health care provider when you can drive, ride a bicycle, or operate heavy machinery. Your ability to react may be slower after a brain injury. Never do these activities if you are dizzy.  Ask your health care provider about when you can return to work or school. Preventing Another Concussion It is very important to avoid another brain injury, especially before you have recovered. In rare cases, another injury can lead to permanent brain damage, brain swelling, or death. The risk of this is greatest during the first 7-10 days after a head injury. Avoid injuries by:  Wearing a seat belt when riding in a car.  Drinking alcohol only in moderation.  Wearing a helmet when biking, skiing, skateboarding, skating, or doing similar activities.  Avoiding activities that could lead to a second concussion, such as contact or recreational sports, until your health care provider says it is okay.  Taking safety measures in your home.  Remove clutter and tripping hazards from floors and stairways.  Use grab bars in bathrooms and handrails by stairs.  Place non-slip mats on floors and in bathtubs.  Improve lighting in dim areas. SEEK MEDICAL CARE IF:  You have increased problems paying attention or concentrating.  You have increased difficulty remembering or learning new information.  You need more time to complete tasks or assignments than before.  You have increased irritability or decreased  ability to cope with stress.  You have more symptoms than before. Seek medical care if you have any of the following symptoms for more than 2 weeks after your injury:  Lasting (chronic) headaches.  Dizziness or balance problems.  Nausea.  Vision problems.  Increased sensitivity to noise or light.  Depression or mood swings.  Anxiety or irritability.  Memory  problems.  Difficulty concentrating or paying attention.  Sleep problems.  Feeling tired all the time. SEEK IMMEDIATE MEDICAL CARE IF:  You have severe or worsening headaches. These may be a sign of a blood clot in the brain.  You have weakness (even if only in one hand, leg, or part of the face).  You have numbness.  You have decreased coordination.  You vomit repeatedly.  You have increased sleepiness.  One pupil is larger than the other.  You have convulsions.  You have slurred speech.  You have increased confusion. This may be a sign of a blood clot in the brain.  You have increased restlessness, agitation, or irritability.  You are unable to recognize people or places.  You have neck pain.  It is difficult to wake you up.  You have unusual behavior changes.  You lose consciousness. MAKE SURE YOU:  Understand these instructions.  Will watch your condition.  Will get help right away if you are not doing well or get worse. Document Released: 02/26/2004 Document Revised: 12/11/2013 Document Reviewed: 06/28/2013 [facility] Patient Information 2015 [place], [place]. This information is not intended to replace advice given to you by your health care provider. Make sure you discuss any questions you have with your health care provider.

## 2015-03-24 NOTE — ED Notes (Addendum)
Patient transported to CT 

## 2015-03-24 NOTE — ED Provider Notes (Signed)
CSN: [id number]     Arrival date & time 03/24/15  1913 History  This chart was scribed for Malvin Johns, MD by Terri Shah, ED Scribe. This patient was seen in room MH01/MH01 and the patient's care was started at 10:17 PM.    Chief Complaint  Patient presents with  . Fall   The history is provided by the patient. No language interpreter was used.     HPI Comments: Terri Shah is a 81 y.o. female with past medical history of HTN, HLD, anemia, arthritis, Barrett esophagus, presbyesophagus,diverticulosis, gastritis, hiatal hernia, history of GI diverticular bleed,  palpitations, who presents to the Emergency Department complaining of fall. Patient explains she was attempting to climb into the bottom bunk of a bunk bed in the dark last night when she missed the bed and fell to the floor. She denies LOC with this fall. She reports that she felt well during the night and throughout the day today, but noted mild "tingling, vibrating" sensations to her entire body this afternoon. She checked her blood pressure and found it to be "high" for her, at 208/144, which prompted her to come to the ED. She currently complains of soreness to the back of her head. She also notes neck and back pain, but endorses both at baseline. She denies new numbness or tingling from baseline, no changes to her balance or gait. She denies any further injury.   She reports TIA several weeks PTA; symptoms at that time included numbness in her face and left hand. She explains that she was seen several days after that incident and was told that she had had a stroke.   Past Medical History  Diagnosis Date  . Hypertension   . Anemia   . Hyperlipidemia   . Arthritis   . Diverticulosis   . Gastritis   . Hiatal hernia   . Barrett esophagus   . Cataract   . History of GI diverticular bleed   . Presbyesophagus   . Iron deficiency anemia   . Palpitations 04/02/2010    Qualifier: Diagnosis of  By: Angelena Form, MD, Harrell Gave    .  ECTOPIC PREGNANCY 02/08/2008    Qualifier: Diagnosis of  By: Mat Carne    . Barrett's esophagus 11/02/2007    Qualifier: Diagnosis of  By: Mat Carne    . GASTRITIS 11/02/2007    Qualifier: Diagnosis of  By: Mat Carne     Past Surgical History  Procedure Laterality Date  . Cesarean section  1950-1960s    x 3  . Tubal ligation    . Facial cosmetic surgery    . Cataract extraction, bilateral    . Laparoscopic cholecystectomy single site with intraoperative cholangiogram N/A 10/27/2014    Procedure: LAPAROSCOPIC CHOLECYSTECTOMY SINGLE SITE WITH INTRAOPERATIVE CHOLANGIOGRAM;  Surgeon: Michael Boston, MD;  Location: [facility];  Service: General;  Laterality: N/A;   Family History  Problem Relation Age of Onset  . Heart disease Mother   . Heart disease Father   . Breast cancer Sister   . Colon cancer Neg Hx   . Diabetes Sister   . Kidney disease Sister   . Stroke Mother    History  Substance Use Topics  . Smoking status: Never Smoker   . Smokeless tobacco: Never Used  . Alcohol Use: Yes     Comment: occasional   OB History    No data available     Review of Systems  Constitutional: Negative for fever, chills,  diaphoresis and fatigue.  HENT: Negative for congestion, rhinorrhea and sneezing.   Eyes: Negative.   Respiratory: Negative for cough, chest tightness and shortness of breath.   Cardiovascular: Negative for chest pain and leg swelling.  Gastrointestinal: Negative for nausea, vomiting, abdominal pain, diarrhea and blood in stool.  Genitourinary: Negative for frequency, hematuria, flank pain and difficulty urinating.  Musculoskeletal: Positive for back pain (at baseline) and neck pain. Negative for arthralgias and gait problem.  Skin: Negative for rash.  Neurological: Positive for headaches. Negative for dizziness, speech difficulty, weakness and numbness.  All other systems reviewed and are negative.  Allergies  Codeine  Home Medications   Prior to  Admission medications   Medication Sig Start Date End Date Taking? Authorizing Provider  acetaminophen (TYLENOL) 500 MG tablet Take 500 mg by mouth every 6 (six) hours as needed. Patient used this medication for her pain.    Historical Provider, MD  amitriptyline (ELAVIL) 25 MG tablet Take 25 mg by mouth at bedtime.     Historical Provider, MD  atorvastatin (LIPITOR) 20 MG tablet Take 20 mg by mouth daily.      Historical Provider, MD  benazepril (LOTENSIN) 20 MG tablet Take 20 mg by mouth daily.      Historical Provider, MD  cholecalciferol (VITAMIN D) 1000 UNITS tablet Take 1,000 Units by mouth daily.      Historical Provider, MD  denosumab (PROLIA) 60 MG/ML SOLN injection Inject 60 mg into the skin every 6 (six) months. Administer in upper arm, thigh, or abdomen    Historical Provider, MD  dorzolamide-timolol (COSOPT) 22.3-6.8 MG/ML ophthalmic solution  11/20/14   Historical Provider, MD  hydrochlorothiazide (,MICROZIDE/HYDRODIURIL,) 12.5 MG capsule Take 12.5 mg by mouth daily. Take 1 tab every other day.    Historical Provider, MD  omeprazole (PRILOSEC) 20 MG capsule Take 20 mg by mouth daily.     Historical Provider, MD  Psyllium (METAMUCIL PO) Take 1 each by mouth at bedtime.    Historical Provider, MD  traMADol (ULTRAM) 50 MG tablet Take 50 mg by mouth every 6 (six) hours as needed for moderate pain.     Historical Provider, MD   BP 188/60 mmHg  Pulse 77  Temp(Src) 98.6 F (37 C) (Oral)  Resp 16  Ht 5\' 1"  (1.549 m)  Wt 122 lb (55.339 kg)  BMI 23.06 kg/m2  SpO2 98% Physical Exam  Constitutional: She is oriented to person, place, and time. She appears well-developed and well-nourished.  HENT:  Head: Normocephalic and atraumatic.  Eyes: Pupils are equal, round, and reactive to light.  Neck: Normal range of motion. Neck supple.  Cardiovascular: Normal rate, regular rhythm and normal heart sounds.   Pulmonary/Chest: Effort normal and breath sounds normal. No respiratory distress. She  has no wheezes. She has no rales. She exhibits no tenderness.  Abdominal: Soft. Bowel sounds are normal. There is no tenderness. There is no rebound and no guarding.  Musculoskeletal: Normal range of motion. She exhibits no edema.  Generalized tenderness throughout the cervical spine; no step offs or deformities. No pain to the thoracic spine, no point tenderness to the lumbosacral spine. No pain with palpation or ROM of the extremities.   Lymphadenopathy:    She has no cervical adenopathy.  Neurological: She is alert and oriented to person, place, and time. No cranial nerve deficit. Coordination normal.  Motor 5/5 in all extremities. Sensation grossly intact to light touch in all extremities. Cranial nerves 2-12 grossly intact. No pronator drift. Finger  to nose intact.   Skin: Skin is warm and dry. No rash noted.  Psychiatric: She has a normal mood and affect.    ED Course  Procedures   DIAGNOSTIC STUDIES: Oxygen Saturation is 99% on RA, normal by my interpretation.    COORDINATION OF CARE: 10:17 PM Discussed treatment plan with pt at bedside and pt agreed to plan.   Labs Review Labs Reviewed - No data to display  Imaging Review Ct Head Wo Contrast  03/24/2015   CLINICAL DATA:  Fall yesterday with right-sided head injury. Headache and neck pain. Diffuse tingling.  EXAM: CT HEAD WITHOUT CONTRAST  CT CERVICAL SPINE WITHOUT CONTRAST  TECHNIQUE: Multidetector CT imaging of the head and cervical spine was performed following the standard protocol without intravenous contrast. Multiplanar CT image reconstructions of the cervical spine were also generated.  COMPARISON:  Brain MRI 02/01/2015  FINDINGS: CT HEAD FINDINGS  Skull and Sinuses:Negative for fracture or destructive process.  Opacification and sclerosis of air cells in the right mastoid tip consistent with chronic mastoiditis.  Orbits: Bilateral cataract resection.  No traumatic findings.  Brain: No evidence of acute infarction,  hemorrhage, hydrocephalus, or mass lesion/mass effect. There is chronic small vessel disease with patchy ischemic gliosis throughout the cerebral white matter. Lacunar infarct in the right thalamus, acute on 02/01/2015 brain MR.  CT CERVICAL SPINE FINDINGS  No evidence of cervical spine fracture. There is chronic spinal malalignment (mild listheses and reversal cervical lordosis) from severe degenerative change at C4-5 to C6-7. No gross cervical canal hematoma or prevertebral edema.  IMPRESSION: No evidence of acute intracranial or cervical spine injury.   Electronically Signed   By: Monte Fantasia M.D.   On: 03/24/2015 23:15   Ct Cervical Spine Wo Contrast  03/24/2015   CLINICAL DATA:  Fall yesterday with right-sided head injury. Headache and neck pain. Diffuse tingling.  EXAM: CT HEAD WITHOUT CONTRAST  CT CERVICAL SPINE WITHOUT CONTRAST  TECHNIQUE: Multidetector CT imaging of the head and cervical spine was performed following the standard protocol without intravenous contrast. Multiplanar CT image reconstructions of the cervical spine were also generated.  COMPARISON:  Brain MRI 02/01/2015  FINDINGS: CT HEAD FINDINGS  Skull and Sinuses:Negative for fracture or destructive process.  Opacification and sclerosis of air cells in the right mastoid tip consistent with chronic mastoiditis.  Orbits: Bilateral cataract resection.  No traumatic findings.  Brain: No evidence of acute infarction, hemorrhage, hydrocephalus, or mass lesion/mass effect. There is chronic small vessel disease with patchy ischemic gliosis throughout the cerebral white matter. Lacunar infarct in the right thalamus, acute on 02/01/2015 brain MR.  CT CERVICAL SPINE FINDINGS  No evidence of cervical spine fracture. There is chronic spinal malalignment (mild listheses and reversal cervical lordosis) from severe degenerative change at C4-5 to C6-7. No gross cervical canal hematoma or prevertebral edema.  IMPRESSION: No evidence of acute intracranial  or cervical spine injury.   Electronically Signed   By: Monte Fantasia M.D.   On: 03/24/2015 23:15     EKG Interpretation None      MDM   Final diagnoses:  Head injury, initial encounter  Cervical strain, initial encounter   Patient has no evidence of acute intracranial or spinal injury. She has no neurologic deficits other than some baseline numbness to the left hand which is chronic and unchanged from her prior stroke. She does have some minor pain to her lower back but she states this is at her baseline and she does not feel  that she injured in the fall. She has an appointment to see her neurosurgeon, Dr. Ronnald Ramp tomorrow regarding her neck. Her blood pressure has improved during her ED stay. I encouraged her to keep an eye on this at home if it's staying elevated to follow-up with her primary care physician.  I personally performed the services described in this documentation, which was scribed in my presence.  The recorded information has been reviewed and considered.       Malvin Johns, MD 03/24/15 [phone]

## 2015-03-24 NOTE — ED Notes (Signed)
Pt fell getting into a bunk bed last night-hit back of head-break in skin, no LOC-c/o elevated BP and tingling to body started today

## 2015-06-12 ENCOUNTER — Other Ambulatory Visit: Payer: Self-pay | Admitting: Internal Medicine

## 2015-06-12 DIAGNOSIS — Z1231 Encounter for screening mammogram for malignant neoplasm of breast: Secondary | ICD-10-CM

## 2015-06-18 ENCOUNTER — Ambulatory Visit
Admission: RE | Admit: 2015-06-18 | Discharge: 2015-06-18 | Disposition: A | Discharge status: Home / Self Care | Payer: Commercial Managed Care - HMO | Source: Ambulatory Visit | Attending: Internal Medicine | Admitting: Internal Medicine

## 2015-06-18 DIAGNOSIS — Z1231 Encounter for screening mammogram for malignant neoplasm of breast: Secondary | ICD-10-CM

## 2015-08-04 ENCOUNTER — Encounter: Payer: Self-pay | Admitting: Internal Medicine

## 2015-10-13 ENCOUNTER — Encounter (HOSPITAL_COMMUNITY): Payer: Self-pay

## 2015-10-13 ENCOUNTER — Ambulatory Visit (HOSPITAL_COMMUNITY)
Admission: RE | Admit: 2015-10-13 | Discharge: 2015-10-13 | Disposition: A | Discharge status: Home / Self Care | Payer: Commercial Managed Care - HMO | Source: Ambulatory Visit | Attending: Internal Medicine | Admitting: Internal Medicine

## 2015-10-13 DIAGNOSIS — M810 Age-related osteoporosis without current pathological fracture: Secondary | ICD-10-CM | POA: Insufficient documentation

## 2015-10-13 MED ORDER — DENOSUMAB 60 MG/ML ~~LOC~~ SOLN
60.0000 mg | Freq: Once | SUBCUTANEOUS | Status: AC
  Administered 2015-10-13: 60 mg via SUBCUTANEOUS
  Filled 2015-10-13: qty 1

## 2015-10-13 NOTE — Discharge Instructions (Signed)
Denosumab injection  What is this medicine?  DENOSUMAB (den oh sue mab) slows bone breakdown. Prolia is used to treat osteoporosis in women after menopause and in men. Xgeva is used to prevent bone fractures and other bone problems caused by cancer bone metastases. Xgeva is also used to treat giant cell tumor of the bone.  This medicine may be used for other purposes; ask your health care provider or pharmacist if you have questions.  What should I tell my health care provider before I take this medicine?  They need to know if you have any of these conditions:  -dental disease  -eczema  -infection or history of infections  -kidney disease or on dialysis  -low blood calcium or vitamin D  -malabsorption syndrome  -scheduled to have surgery or tooth extraction  -taking medicine that contains denosumab  -thyroid or parathyroid disease  -an unusual reaction to denosumab, other medicines, foods, dyes, or preservatives  -pregnant or trying to get pregnant  -breast-feeding  How should I use this medicine?  This medicine is for injection under the skin. It is given by a health care professional in a hospital or clinic setting.  If you are getting Prolia, a special MedGuide will be given to you by the pharmacist with each prescription and refill. Be sure to read this information carefully each time.  For Prolia, talk to your pediatrician regarding the use of this medicine in children. Special care may be needed. For Xgeva, talk to your pediatrician regarding the use of this medicine in children. While this drug may be prescribed for children as young as 13 years for selected conditions, precautions do apply.  Overdosage: If you think you have taken too much of this medicine contact a poison control center or emergency room at once.  NOTE: This medicine is only for you. Do not share this medicine with others.  What if I miss a dose?  It is important not to miss your dose. Call your doctor or health care professional if you are  unable to keep an appointment.  What may interact with this medicine?  Do not take this medicine with any of the following medications:  -other medicines containing denosumab  This medicine may also interact with the following medications:  -medicines that suppress the immune system  -medicines that treat cancer  -steroid medicines like prednisone or cortisone  This list may not describe all possible interactions. Give your health care provider a list of all the medicines, herbs, non-prescription drugs, or dietary supplements you use. Also tell them if you smoke, drink alcohol, or use illegal drugs. Some items may interact with your medicine.  What should I watch for while using this medicine?  Visit your doctor or health care professional for regular checks on your progress. Your doctor or health care professional may order blood tests and other tests to see how you are doing.  Call your doctor or health care professional if you get a cold or other infection while receiving this medicine. Do not treat yourself. This medicine may decrease your body's ability to fight infection.  You should make sure you get enough calcium and vitamin D while you are taking this medicine, unless your doctor tells you not to. Discuss the foods you eat and the vitamins you take with your health care professional.  See your dentist regularly. Brush and floss your teeth as directed. Before you have any dental work done, tell your dentist you are receiving this medicine.  Do   not become pregnant while taking this medicine or for 5 months after stopping it. Women should inform their doctor if they wish to become pregnant or think they might be pregnant. There is a potential for serious side effects to an unborn child. Talk to your health care professional or pharmacist for more information.  What side effects may I notice from receiving this medicine?  Side effects that you should report to your doctor or health care professional as soon as  possible:  -allergic reactions like skin rash, itching or hives, swelling of the face, lips, or tongue  -breathing problems  -chest pain  -fast, irregular heartbeat  -feeling faint or lightheaded, falls  -fever, chills, or any other sign of infection  -muscle spasms, tightening, or twitches  -numbness or tingling  -skin blisters or bumps, or is dry, peels, or red  -slow healing or unexplained pain in the mouth or jaw  -unusual bleeding or bruising  Side effects that usually do not require medical attention (Report these to your doctor or health care professional if they continue or are bothersome.):  -muscle pain  -stomach upset, gas  This list may not describe all possible side effects. Call your doctor for medical advice about side effects. You may report side effects to FDA at 1-800-FDA-1088.  Where should I keep my medicine?  This medicine is only given in a clinic, doctor's office, or other health care setting and will not be stored at home.  NOTE: This sheet is a summary. It may not cover all possible information. If you have questions about this medicine, talk to your doctor, pharmacist, or health care provider.      2016, [employer]. (2012-06-05 12:37:47)

## 2015-12-16 ENCOUNTER — Telehealth: Payer: Self-pay | Admitting: Internal Medicine

## 2015-12-16 NOTE — Telephone Encounter (Signed)
Agree that this patient should go to the emergency room for acute GI bleeding. Not sure why she called if she is resistant to recommendations. In any event, to continue as a GI patient this practice she should establish with one of our new Associates as Dr. Olevia Perches has retired

## 2015-12-16 NOTE — Telephone Encounter (Signed)
Patient with a history with Dr. Olevia Perches and "multiple diverticular bleeds".  She reports that she has had 2 large episodes of rectal bleeding, one last night the other today at 11:00 am. Bright red "looks like a large amount".   She is feeling "a little weak, but admits " I haven't eaten in the last 2 days".  She wants to know what she should do.  She is advised that if she has any further bleeding she should proceed to the ER.  She states she does not see the need to go to the ER "even if I have more bleeding".  She reports in the past they have always been self limiting bleeds.  Dr. Nichola Sizer note from 06/12/14 states she has required transfusions in the past, the patient denies this "this must be a mistake".  She asked who will follow her as a patient? I reviewed with her our new experienced MDs, she wants to speak to her primary care and have them refer her here to the physician of their choice.  Patient is advised again if she has any additional bleeding she should go to the ER.  She plans on calling back her primary care.  She declined future appts with Dr. Havery Moros, Silverio Decamp, or [facility].     Dr. Henrene Pastor please review as MD of the day.

## 2015-12-17 ENCOUNTER — Encounter (HOSPITAL_COMMUNITY): Payer: Self-pay | Admitting: Emergency Medicine

## 2015-12-17 ENCOUNTER — Emergency Department (HOSPITAL_COMMUNITY): Payer: Commercial Managed Care - HMO

## 2015-12-17 ENCOUNTER — Other Ambulatory Visit: Payer: Self-pay

## 2015-12-17 ENCOUNTER — Emergency Department (HOSPITAL_COMMUNITY)
Admission: EM | Admit: 2015-12-17 | Discharge: 2015-12-17 | Disposition: A | Discharge status: Home / Self Care | Payer: Commercial Managed Care - HMO | Source: Home / Self Care | Attending: Emergency Medicine | Admitting: Emergency Medicine

## 2015-12-17 DIAGNOSIS — K625 Hemorrhage of anus and rectum: Secondary | ICD-10-CM

## 2015-12-17 LAB — COMPREHENSIVE METABOLIC PANEL
ALT: 14 U/L (ref 14–54)
AST: 20 U/L (ref 15–41)
Albumin: 4 g/dL (ref 3.5–5.0)
Alkaline Phosphatase: 39 U/L (ref 38–126)
Anion gap: 9 (ref 5–15)
BUN: 14 mg/dL (ref 6–20)
CO2: 28 mmol/L (ref 22–32)
Calcium: 8.7 mg/dL — ABNORMAL LOW (ref 8.9–10.3)
Chloride: 93 mmol/L — ABNORMAL LOW (ref 101–111)
Creatinine, Ser: 0.65 mg/dL (ref 0.44–1.00)
GFR calc Af Amer: >60 mL/min (ref >60)
GFR calc non Af Amer: >60 mL/min (ref >60)
Glucose, Bld: 130 mg/dL — ABNORMAL HIGH (ref 65–99)
Potassium: 3.3 mmol/L — ABNORMAL LOW (ref 3.5–5.1)
Sodium: 130 mmol/L — ABNORMAL LOW (ref 135–145)
Total Bilirubin: 0.4 mg/dL (ref 0.3–1.2)
Total Protein: 6.6 g/dL (ref 6.5–8.1)

## 2015-12-17 LAB — CBC
HCT: 29.8 % — ABNORMAL LOW (ref 36.0–46.0)
Hemoglobin: 10 g/dL — ABNORMAL LOW (ref 12.0–15.0)
MCH: 30.7 pg (ref 26.0–34.0)
MCHC: 33.6 g/dL (ref 30.0–36.0)
MCV: 91.4 fL (ref 78.0–100.0)
Platelets: 278 10*3/uL (ref 150–400)
RBC: 3.26 MIL/uL — ABNORMAL LOW (ref 3.87–5.11)
RDW: 12 % (ref 11.5–15.5)
WBC: 13.1 10*3/uL — ABNORMAL HIGH (ref 4.0–10.5)

## 2015-12-17 LAB — TYPE AND SCREEN
ABO/RH(D): O NEG
Antibody Screen: NEGATIVE

## 2015-12-17 LAB — POC OCCULT BLOOD, ED: Fecal Occult Bld: POSITIVE — AB

## 2015-12-17 LAB — ABO/RH: ABO/RH(D): O NEG

## 2015-12-17 MED ORDER — SODIUM CHLORIDE 0.9 % IV BOLUS (SEPSIS)
500.0000 mL | Freq: Once | INTRAVENOUS | Status: AC
  Administered 2015-12-17: 500 mL via INTRAVENOUS

## 2015-12-17 MED ORDER — IOHEXOL 300 MG/ML  SOLN
100.0000 mL | Freq: Once | INTRAMUSCULAR | Status: AC | PRN
  Administered 2015-12-17: 100 mL via INTRAVENOUS

## 2015-12-17 MED ORDER — IOHEXOL 300 MG/ML  SOLN
25.0000 mL | Freq: Once | INTRAMUSCULAR | Status: AC | PRN
Start: 2015-12-17 — End: 2015-12-17
  Administered 2015-12-17: 25 mL via ORAL

## 2015-12-17 NOTE — ED Notes (Signed)
Pt escorted to discharge window. Pt verbalized understanding discharge instructions. In no acute distress.  

## 2015-12-17 NOTE — ED Notes (Signed)
Pt back from CT

## 2015-12-17 NOTE — ED Provider Notes (Signed)
CSN: [id number]     Arrival date & time 12/17/15  0600 History   First MD Initiated Contact with Patient 12/17/15 [phone]     Chief Complaint  Patient presents with  . Rectal Bleeding     (Consider location/radiation/quality/duration/timing/severity/associated sxs/prior Treatment) HPI.... Several episodes of rectal bleeding since Monday evening. This has happened before. Patient feels weak, but is ambulatory at her home. She has a history of diverticulosis. She has been evaluated by Dr. Delfin Edis at [facility].  No mucus in the stool. No fever, sweats, chills, dysuria, abdominal pain.  Severity of symptoms is moderate.  Past Medical History  Diagnosis Date  . Hypertension   . Anemia   . Hyperlipidemia   . Arthritis   . Diverticulosis   . Gastritis   . Hiatal hernia   . Barrett esophagus   . Cataract   . History of GI diverticular bleed   . Presbyesophagus   . Iron deficiency anemia   . Palpitations 04/02/2010    Qualifier: Diagnosis of  By: Angelena Form, MD, Harrell Gave    . ECTOPIC PREGNANCY 02/08/2008    Qualifier: Diagnosis of  By: Mat Carne    . Barrett's esophagus 11/02/2007    Qualifier: Diagnosis of  By: Mat Carne    . GASTRITIS 11/02/2007    Qualifier: Diagnosis of  By: Mat Carne     Past Surgical History  Procedure Laterality Date  . Cesarean section  1950-1960s    x 3  . Tubal ligation    . Facial cosmetic surgery    . Cataract extraction, bilateral    . Laparoscopic cholecystectomy single site with intraoperative cholangiogram N/A 10/27/2014    Procedure: LAPAROSCOPIC CHOLECYSTECTOMY SINGLE SITE WITH INTRAOPERATIVE CHOLANGIOGRAM;  Surgeon: Michael Boston, MD;  Location: [facility];  Service: General;  Laterality: N/A;   Family History  Problem Relation Age of Onset  . Heart disease Mother   . Heart disease Father   . Breast cancer Sister   . Colon cancer Neg Hx   . Diabetes Sister   . Kidney disease Sister   . Stroke Mother    Social History   Substance Use Topics  . Smoking status: Never Smoker   . Smokeless tobacco: Never Used  . Alcohol Use: Yes     Comment: occasional   OB History    No data available     Review of Systems  All other systems reviewed and are negative.     Allergies  Codeine  Home Medications   Prior to Admission medications   Medication Sig Start Date End Date Taking? Authorizing Provider  acetaminophen (TYLENOL) 500 MG tablet Take 500 mg by mouth every 6 (six) hours as needed. Patient used this medication for her pain.   Yes Historical Provider, MD  amitriptyline (ELAVIL) 25 MG tablet Take 25 mg by mouth at bedtime as needed for sleep.    Yes Historical Provider, MD  aspirin EC 81 MG tablet Take 81 mg by mouth daily.   Yes Historical Provider, MD  atorvastatin (LIPITOR) 20 MG tablet Take 20 mg by mouth daily.     Yes Historical Provider, MD  benazepril (LOTENSIN) 40 MG tablet Take 40 mg by mouth daily. 09/18/15  Yes Historical Provider, MD  cholecalciferol (VITAMIN D) 1000 UNITS tablet Take 1,000 Units by mouth daily.     Yes Historical Provider, MD  denosumab (PROLIA) 60 MG/ML SOLN injection Inject 60 mg into the skin every 6 (six) months. Administer in  upper arm, thigh, or abdomen   Yes Historical Provider, MD  dorzolamide-timolol (COSOPT) 22.3-6.8 MG/ML ophthalmic solution Place 1 drop into the right eye 2 (two) times daily.  11/20/14  Yes Historical Provider, MD  hydrochlorothiazide (HYDRODIURIL) 25 MG tablet Take 25 mg by mouth every other day.  10/08/15  Yes Historical Provider, MD  omeprazole (PRILOSEC) 20 MG capsule Take 20 mg by mouth daily.    Yes Historical Provider, MD  polyvinyl alcohol (LIQUIFILM TEARS) 1.4 % ophthalmic solution Place 1 drop into both eyes daily as needed for dry eyes.   Yes Historical Provider, MD  Psyllium (METAMUCIL PO) Take 1 each by mouth at bedtime.   Yes Historical Provider, MD  Saline 2.65 % SOLN Place 1 each into the nose daily as needed (congestion).   Yes  Historical Provider, MD  traMADol (ULTRAM) 50 MG tablet Take 50 mg by mouth every 6 (six) hours as needed for moderate pain.    Yes Historical Provider, MD   BP 100/63 mmHg  Pulse 75  Temp(Src) 98.1 F (36.7 C) (Oral)  Resp 15  Ht 5\' 1"  (1.549 m)  Wt 128 lb (58.06 kg)  BMI 24.20 kg/m2  SpO2 99% Physical Exam  Constitutional: She is oriented to person, place, and time. She appears well-developed and well-nourished.  HENT:  Head: Normocephalic and atraumatic.  Eyes: Conjunctivae and EOM are normal. Pupils are equal, round, and reactive to light.  Neck: Normal range of motion. Neck supple.  Cardiovascular: Normal rate and regular rhythm.   Pulmonary/Chest: Effort normal and breath sounds normal.  Abdominal: Soft. Bowel sounds are normal.  Nontender  Genitourinary:  Rectal exam: No masses. Small amount of red maroon blood on glove. Heme positive  Musculoskeletal: Normal range of motion.  Neurological: She is alert and oriented to person, place, and time.  Skin: Skin is warm and dry.  Psychiatric: She has a normal mood and affect. Her behavior is normal.  Nursing note and vitals reviewed.   ED Course  Procedures (including critical care time) Labs Review Labs Reviewed  COMPREHENSIVE METABOLIC PANEL - Abnormal; Notable for the following:    Sodium 130 (*)    Potassium 3.3 (*)    Chloride 93 (*)    Glucose, Bld 130 (*)    Calcium 8.7 (*)    All other components within normal limits  CBC - Abnormal; Notable for the following:    WBC 13.1 (*)    RBC 3.26 (*)    Hemoglobin 10.0 (*)    HCT 29.8 (*)    All other components within normal limits  POC OCCULT BLOOD, ED - Abnormal; Notable for the following:    Fecal Occult Bld POSITIVE (*)    All other components within normal limits  TYPE AND SCREEN  ABO/RH    Imaging Review Ct Abdomen Pelvis W Contrast  12/17/2015  CLINICAL DATA:  Rectal bleeding for 2 days, bright red blood, history diverticulosis with bleeding,  gastritis, hypertension, hiatal hernia, hyperlipidemia EXAM: CT ABDOMEN AND PELVIS WITH CONTRAST TECHNIQUE: Multidetector CT imaging of the abdomen and pelvis was performed using the standard protocol following bolus administration of intravenous contrast. CONTRAST:  6mL OMNIPAQUE IOHEXOL 300 MG/ML SOLN, 143mL OMNIPAQUE IOHEXOL 300 MG/ML SOLN COMPARISON:  CT abdomen and pelvis 10/29/2014 FINDINGS: Probable BILATERAL posterior diaphragmatic defects of Bochdalek type containing herniated fat. Two liver cysts largest caudate lobe 3.2 x 2.6 cm image 14. Gallbladder surgically absent. Liver, spleen, pancreas, kidneys, and adrenal glands normal. Diffuse colonic diverticulosis without  wall thickening or pericolic inflammatory changes to suggest acute diverticulitis. Colon unopacified and under distended most notably at the splenic flexure and transverse colon. Normal appendix. Small hiatal hernia. Stomach and bowel loops otherwise normal appearance. Normal appearing bladder, ureters, uterus and adnexa. No mass, adenopathy, free air, free fluid, or inflammatory process. Bones demineralized without focal abnormality. IMPRESSION: Extensive colonic diverticulosis without definite evidence of diverticulitis. Hepatic cysts. 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 Small hiatal hernia. Electronically Signed   By: Lavonia Dana M.D.   On: 12/17/2015 08:54   I have personally reviewed and evaluated these images and lab results as part of my medical decision-making.   EKG Interpretation None      MDM   Final diagnoses:  Rectal bleeding    Patient is hemodynamically stable. CT scan reveals extensive diverticulosis but no diverticulitis. Discussed with our gastroenterology physician assistant.  She will follow-up with appointment on Thursday, December 29    Nat Christen, MD 12/17/15 1157

## 2015-12-17 NOTE — ED Notes (Signed)
Pt alert and oriented x4. Respirations even and unlabored, bilateral symmetrical rise and fall of chest. Skin warm and dry. In no acute distress. Denies needs.   

## 2015-12-17 NOTE — ED Notes (Signed)
Per md pt allowed to take daily morning meds. blood pressure, diuretic, vit d, and cholesterol medicine.

## 2015-12-17 NOTE — ED Notes (Signed)
Patient here with complaints of rectal bleeding x2 days, bright red blood. Reports that she has had this problem before. Denies n/v.

## 2015-12-17 NOTE — Discharge Instructions (Signed)
Tests showed no life-threatening condition. Liquids today including ensure. Follow-up with [facility] at 1:30 PM Thursday

## 2015-12-18 ENCOUNTER — Encounter: Payer: Self-pay | Admitting: Physician Assistant

## 2015-12-18 ENCOUNTER — Ambulatory Visit (INDEPENDENT_AMBULATORY_CARE_PROVIDER_SITE_OTHER): Payer: Self-pay | Admitting: Physician Assistant

## 2015-12-18 ENCOUNTER — Encounter (HOSPITAL_COMMUNITY): Payer: Self-pay

## 2015-12-18 ENCOUNTER — Other Ambulatory Visit (INDEPENDENT_AMBULATORY_CARE_PROVIDER_SITE_OTHER): Payer: Commercial Managed Care - HMO

## 2015-12-18 ENCOUNTER — Inpatient Hospital Stay (HOSPITAL_COMMUNITY)
Admission: AD | Admit: 2015-12-18 | Discharge: 2015-12-20 | DRG: 378 | Disposition: A | Discharge status: Home / Self Care | Payer: Commercial Managed Care - HMO | Source: Ambulatory Visit | Attending: Gastroenterology | Admitting: Gastroenterology

## 2015-12-18 VITALS — BP 80/50 | HR 96 | Ht 61.0 in | Wt 125.8 lb

## 2015-12-18 DIAGNOSIS — K2270 Barrett's esophagus without dysplasia: Secondary | ICD-10-CM | POA: Diagnosis present

## 2015-12-18 DIAGNOSIS — K625 Hemorrhage of anus and rectum: Secondary | ICD-10-CM

## 2015-12-18 DIAGNOSIS — E785 Hyperlipidemia, unspecified: Secondary | ICD-10-CM | POA: Diagnosis present

## 2015-12-18 DIAGNOSIS — I959 Hypotension, unspecified: Secondary | ICD-10-CM

## 2015-12-18 DIAGNOSIS — D120 Benign neoplasm of cecum: Secondary | ICD-10-CM | POA: Diagnosis present

## 2015-12-18 DIAGNOSIS — I10 Essential (primary) hypertension: Secondary | ICD-10-CM | POA: Diagnosis present

## 2015-12-18 DIAGNOSIS — Z8249 Family history of ischemic heart disease and other diseases of the circulatory system: Secondary | ICD-10-CM | POA: Diagnosis not present

## 2015-12-18 DIAGNOSIS — Z841 Family history of disorders of kidney and ureter: Secondary | ICD-10-CM

## 2015-12-18 DIAGNOSIS — K922 Gastrointestinal hemorrhage, unspecified: Secondary | ICD-10-CM | POA: Diagnosis not present

## 2015-12-18 DIAGNOSIS — Z885 Allergy status to narcotic agent status: Secondary | ICD-10-CM | POA: Diagnosis not present

## 2015-12-18 DIAGNOSIS — Z833 Family history of diabetes mellitus: Secondary | ICD-10-CM | POA: Diagnosis not present

## 2015-12-18 DIAGNOSIS — K5731 Diverticulosis of large intestine without perforation or abscess with bleeding: Principal | ICD-10-CM

## 2015-12-18 DIAGNOSIS — Z79899 Other long term (current) drug therapy: Secondary | ICD-10-CM

## 2015-12-18 DIAGNOSIS — E876 Hypokalemia: Secondary | ICD-10-CM | POA: Diagnosis present

## 2015-12-18 DIAGNOSIS — K449 Diaphragmatic hernia without obstruction or gangrene: Secondary | ICD-10-CM | POA: Diagnosis present

## 2015-12-18 DIAGNOSIS — K635 Polyp of colon: Secondary | ICD-10-CM | POA: Insufficient documentation

## 2015-12-18 DIAGNOSIS — Z803 Family history of malignant neoplasm of breast: Secondary | ICD-10-CM

## 2015-12-18 DIAGNOSIS — R42 Dizziness and giddiness: Secondary | ICD-10-CM | POA: Diagnosis present

## 2015-12-18 DIAGNOSIS — Z823 Family history of stroke: Secondary | ICD-10-CM | POA: Diagnosis not present

## 2015-12-18 DIAGNOSIS — D62 Acute posthemorrhagic anemia: Secondary | ICD-10-CM | POA: Diagnosis present

## 2015-12-18 DIAGNOSIS — Z7982 Long term (current) use of aspirin: Secondary | ICD-10-CM

## 2015-12-18 DIAGNOSIS — D126 Benign neoplasm of colon, unspecified: Secondary | ICD-10-CM | POA: Diagnosis not present

## 2015-12-18 DIAGNOSIS — M1990 Unspecified osteoarthritis, unspecified site: Secondary | ICD-10-CM | POA: Diagnosis present

## 2015-12-18 LAB — COMPREHENSIVE METABOLIC PANEL
ALT: 13 U/L — ABNORMAL LOW (ref 14–54)
AST: 17 U/L (ref 15–41)
Albumin: 3.7 g/dL (ref 3.5–5.0)
Alkaline Phosphatase: 34 U/L — ABNORMAL LOW (ref 38–126)
Anion gap: 9 (ref 5–15)
BUN: 11 mg/dL (ref 6–20)
CO2: 28 mmol/L (ref 22–32)
Calcium: 9.2 mg/dL (ref 8.9–10.3)
Chloride: 98 mmol/L — ABNORMAL LOW (ref 101–111)
Creatinine, Ser: 0.71 mg/dL (ref 0.44–1.00)
GFR calc Af Amer: >60 mL/min (ref >60)
GFR calc non Af Amer: >60 mL/min (ref >60)
Glucose, Bld: 89 mg/dL (ref 65–99)
Potassium: 3.2 mmol/L — ABNORMAL LOW (ref 3.5–5.1)
Sodium: 135 mmol/L (ref 135–145)
Total Bilirubin: 0.8 mg/dL (ref 0.3–1.2)
Total Protein: 6 g/dL — ABNORMAL LOW (ref 6.5–8.1)

## 2015-12-18 LAB — BASIC METABOLIC PANEL
BUN: 12 mg/dL (ref 6–23)
CO2: 27 mEq/L (ref 19–32)
Calcium: 9.4 mg/dL (ref 8.4–10.5)
Chloride: 96 mEq/L (ref 96–112)
Creatinine, Ser: 0.67 mg/dL (ref 0.40–1.20)
GFR: 90.14 mL/min (ref >60.00)
Glucose, Bld: 105 mg/dL — ABNORMAL HIGH (ref 70–99)
Potassium: 3.3 mEq/L — ABNORMAL LOW (ref 3.5–5.1)
Sodium: 133 mEq/L — ABNORMAL LOW (ref 135–145)

## 2015-12-18 LAB — CBC WITH DIFFERENTIAL/PLATELET
Basophils Absolute: 0 10*3/uL (ref 0.0–0.1)
Basophils Relative: 0.2 % (ref 0.0–3.0)
Eosinophils Absolute: 0 10*3/uL (ref 0.0–0.7)
Eosinophils Relative: 0.3 % (ref 0.0–5.0)
HCT: 28.5 % — ABNORMAL LOW (ref 36.0–46.0)
Hemoglobin: 9.5 g/dL — ABNORMAL LOW (ref 12.0–15.0)
Lymphocytes Relative: 17.7 % (ref 12.0–46.0)
Lymphs Abs: 2 10*3/uL (ref 0.7–4.0)
MCHC: 33.4 g/dL (ref 30.0–36.0)
MCV: 91.6 fl (ref 78.0–100.0)
Monocytes Absolute: 1.1 10*3/uL — ABNORMAL HIGH (ref 0.1–1.0)
Monocytes Relative: 9.9 % (ref 3.0–12.0)
Neutro Abs: 8.1 10*3/uL — ABNORMAL HIGH (ref 1.4–7.7)
Neutrophils Relative %: 71.9 % (ref 43.0–77.0)
Platelets: 284 10*3/uL (ref 150.0–400.0)
RBC: 3.11 Mil/uL — ABNORMAL LOW (ref 3.87–5.11)
RDW: 12.9 % (ref 11.5–15.5)
WBC: 11.3 10*3/uL — ABNORMAL HIGH (ref 4.0–10.5)

## 2015-12-18 LAB — PROTIME-INR
INR: 1.07 (ref 0.00–1.49)
Prothrombin Time: 14.1 seconds (ref 11.6–15.2)

## 2015-12-18 LAB — PREPARE RBC (CROSSMATCH)

## 2015-12-18 LAB — HEMOGLOBIN: Hemoglobin: 8.9 g/dL — ABNORMAL LOW (ref 12.0–15.0)

## 2015-12-18 MED ORDER — ONDANSETRON HCL 4 MG/2ML IJ SOLN: 4.0000 mg | Freq: Four times a day (QID) | INTRAMUSCULAR | Status: DC | PRN

## 2015-12-18 MED ORDER — DORZOLAMIDE HCL-TIMOLOL MAL 2-0.5 % OP SOLN
1.0000 [drp] | Freq: Two times a day (BID) | OPHTHALMIC | Status: DC
  Administered 2015-12-18 – 2015-12-20 (×4): 1 [drp] via OPHTHALMIC
  Filled 2015-12-18: qty 10

## 2015-12-18 MED ORDER — AMITRIPTYLINE HCL 25 MG PO TABS
25.0000 mg | ORAL_TABLET | Freq: Every day | ORAL | Status: DC
  Administered 2015-12-18 – 2015-12-19 (×2): 25 mg via ORAL
  Filled 2015-12-18 (×3): qty 1

## 2015-12-18 MED ORDER — PANTOPRAZOLE SODIUM 40 MG PO TBEC
40.0000 mg | DELAYED_RELEASE_TABLET | Freq: Every day | ORAL | Status: DC
  Administered 2015-12-18 – 2015-12-20 (×3): 40 mg via ORAL
  Filled 2015-12-18 (×3): qty 1

## 2015-12-18 MED ORDER — PEG 3350-KCL-NABCB-NACL-NASULF 236 G PO SOLR
4000.0000 mL | Freq: Once | ORAL | Status: AC
  Administered 2015-12-18: 4000 mL via ORAL
  Filled 2015-12-18: qty 4000

## 2015-12-18 MED ORDER — ACETAMINOPHEN 650 MG RE SUPP: 650.0000 mg | Freq: Four times a day (QID) | RECTAL | Status: DC | PRN

## 2015-12-18 MED ORDER — SODIUM CHLORIDE 0.9 % IV SOLN
INTRAVENOUS | Status: DC
  Administered 2015-12-18: 17:00:00 via INTRAVENOUS

## 2015-12-18 MED ORDER — KCL IN DEXTROSE-NACL 20-5-0.45 MEQ/L-%-% IV SOLN
INTRAVENOUS | Status: DC
  Filled 2015-12-18: qty 1000

## 2015-12-18 MED ORDER — ACETAMINOPHEN 325 MG PO TABS
650.0000 mg | ORAL_TABLET | Freq: Four times a day (QID) | ORAL | Status: DC | PRN
  Administered 2015-12-19: 650 mg via ORAL
  Filled 2015-12-18: qty 2

## 2015-12-18 MED ORDER — SODIUM CHLORIDE 0.9 % IV SOLN
Freq: Once | INTRAVENOUS | Status: AC
  Administered 2015-12-18: 16:00:00 via INTRAVENOUS

## 2015-12-18 MED ORDER — ONDANSETRON HCL 4 MG PO TABS: 4.0000 mg | ORAL_TABLET | Freq: Four times a day (QID) | ORAL | Status: DC | PRN

## 2015-12-18 NOTE — H&P (Signed)
Admission Note  Primary Care Physician:  Precious Reel, MD Primary Gastroenterologist:    Former Dr Olevia Perches  HPI: Terri Shah is a 81 y.o. female  Previous patient of Dr. Sydell Axon Brodie's. Her primary physician is Dr. Virgina Jock. Patient has history of recurrent diverticular bleeding and states that she has had multiple episodes over the past 24 years. She has only required admission twice and has not required transfusions. She had onset of bright red blood per rectum on Monday, 12/15/2015. She has had slow ongoing bleeding since. The following day he had 2 episodes of bright red blood and then yesterday was awakened at 3 AM with 1 episode of bright red blood per rectum. She had another episode about 6 AM and then decided to go to the emergency room for evaluation. She was seen and evaluated in the ER by the ER physician. Found to have a hemoglobin of 10 was otherwise quite stable and noted prior history of anemia. 2 of the abdomen and pelvis done showing extensive diverticular disease. Decision was made to send her home with chronic GI follow-up. She was then seen in office this afternoon. Labs had been repeated and hemoglobin 9.5 hematocrit of 28.5 WBC 11.3 sodium 133 potassium 3.3.  Patient noted to be hypotensive in the office with blood pressure of 80/50 pulse 96. She is on hydrochlorothiazide and benazepril at home and has continued to take both of these this week including today. She denies any abdominal pain and discomfort and nausea etc. She says she felt a little bit weak yesterday but today feels weaker and has been lightheaded with ambulation. He had 1 small episodes of bright red blood per rectum last evening and then had another episode this morning which she says with smaller volume and a lot of clot.  She is also on a baby aspirin which she had stopped on Monday when the bleeding started. Last colonoscopy 2011 which showed severe pandiverticulosis: Last EGD 2014 with small hiatal hernia  distal stricture dilated and no evidence of Barrett's on biopsy. Absolutely endoscopy had been done in 2011 for iron deficiency anemia and was negative.   Past Medical History  Diagnosis Date  . Hypertension   . Anemia   . Hyperlipidemia   . Arthritis   . Diverticulosis   . Gastritis   . Hiatal hernia   . Barrett esophagus   . Cataract   . History of GI diverticular bleed   . Presbyesophagus   . Iron deficiency anemia   . Palpitations 04/02/2010    Qualifier: Diagnosis of  By: Angelena Form, MD, Harrell Gave    . ECTOPIC PREGNANCY 02/08/2008    Qualifier: Diagnosis of  By: Mat Carne    . Barrett's esophagus 11/02/2007    Qualifier: Diagnosis of  By: Mat Carne    . GASTRITIS 11/02/2007    Qualifier: Diagnosis of  By: Mat Carne      Past Surgical History  Procedure Laterality Date  . Cesarean section  1950-1960s    x 3  . Tubal ligation    . Facial cosmetic surgery    . Cataract extraction, bilateral    . Laparoscopic cholecystectomy single site with intraoperative cholangiogram N/A 10/27/2014    Procedure: LAPAROSCOPIC CHOLECYSTECTOMY SINGLE SITE WITH INTRAOPERATIVE CHOLANGIOGRAM;  Surgeon: Michael Boston, MD;  Location: [facility];  Service: General;  Laterality: N/A;    Prior to Admission medications   Medication Sig Start Date End Date Taking? Authorizing Provider  acetaminophen (TYLENOL)  500 MG tablet Take 500 mg by mouth every 6 (six) hours as needed. Patient used this medication for her pain.    Historical Provider, MD  amitriptyline (ELAVIL) 25 MG tablet Take 25 mg by mouth at bedtime as needed for sleep.     Historical Provider, MD  aspirin EC 81 MG tablet Take 81 mg by mouth daily.    Historical Provider, MD  atorvastatin (LIPITOR) 20 MG tablet Take 20 mg by mouth daily.      Historical Provider, MD  benazepril (LOTENSIN) 40 MG tablet Take 40 mg by mouth daily. 09/18/15   Historical Provider, MD  cholecalciferol (VITAMIN D) 1000 UNITS tablet Take 1,000 Units by  mouth daily.      Historical Provider, MD  denosumab (PROLIA) 60 MG/ML SOLN injection Inject 60 mg into the skin every 6 (six) months. Administer in upper arm, thigh, or abdomen    Historical Provider, MD  dorzolamide-timolol (COSOPT) 22.3-6.8 MG/ML ophthalmic solution Place 1 drop into the right eye 2 (two) times daily.  11/20/14   Historical Provider, MD  hydrochlorothiazide (HYDRODIURIL) 25 MG tablet Take 25 mg by mouth every other day.  10/08/15   Historical Provider, MD  omeprazole (PRILOSEC) 20 MG capsule Take 20 mg by mouth daily.     Historical Provider, MD  polyvinyl alcohol (LIQUIFILM TEARS) 1.4 % ophthalmic solution Place 1 drop into both eyes daily as needed for dry eyes.    Historical Provider, MD  Psyllium (METAMUCIL PO) Take 1 each by mouth at bedtime.    Historical Provider, MD  Saline 2.65 % SOLN Place 1 each into the nose daily as needed (congestion).    Historical Provider, MD  traMADol (ULTRAM) 50 MG tablet Take 50 mg by mouth every 6 (six) hours as needed for moderate pain.     Historical Provider, MD    Current Facility-Administered Medications  Medication Dose Route Frequency Provider Last Rate Last Dose  . acetaminophen (TYLENOL) tablet 650 mg  650 mg Oral Q6H PRN Marica Trentham S Darin Redmann, PA-C       Or  . acetaminophen (TYLENOL) suppository 650 mg  650 mg Rectal Q6H PRN Conan Mcmanaway S Mak Bonny, PA-C      . amitriptyline (ELAVIL) tablet 25 mg  25 mg Oral QHS Payge Eppes S Allexus Ovens, PA-C      . dextrose 5 % and 0.45 % NaCl with KCl 20 mEq/L infusion   Intravenous Continuous Railynn Ballo S Aleksander Edmiston, PA-C      . dorzolamide-timolol (COSOPT) 22.3-6.8 MG/ML ophthalmic solution 1 drop  1 drop Right Eye BID Rhodes[place] Stanger S Brinleigh Tew, PA-C      . ondansetron (ZOFRAN) tablet 4 mg  4 mg Oral Q6H PRN Alger Kerstein S Jeffie Widdowson, PA-C       Or  . ondansetron (ZOFRAN) injection 4 mg  4 mg Intravenous Q6H PRN Peregrine Nolt S Abbeygail Igoe, PA-C      . pantoprazole (PROTONIX) EC tablet 40 mg  40 mg Oral Daily Blakeley Margraf S Norbert Malkin, PA-C      . polyethylene  glycol (GoLYTELY/NuLYTELY) solution 4,000 mL  4,000 mL Oral Once Lino Wickliff S Cejay Cambre, PA-C        Allergies as of 12/18/2015 - Review Complete 12/18/2015  Allergen Reaction Noted  . Codeine Other (See Comments) 10/17/2009    Family History  Problem Relation Age of Onset  . Heart disease Mother   . Heart disease Father   . Breast cancer Sister   . Colon cancer Neg Hx   . Diabetes Sister   .  Kidney disease Sister   . Stroke Mother     Social History   Social History  . Marital Status: Widowed    Spouse Name: N/A  . Number of Children: 3  . Years of Education: N/A   Occupational History  . Not on file.   Social History Main Topics  . Smoking status: Never Smoker   . Smokeless tobacco: Never Used  . Alcohol Use: Yes     Comment: occasional  . Drug Use: No  . Sexual Activity: Not on file   Other Topics Concern  . Not on file   Social History Narrative    Review of Systems:  All systems reviewed an negative except where noted in HPI.   Physical Exam: Vital signs in last 24 hours: Temp:  [98.2 F (36.8 C)] 98.2 F (36.8 C) (12/29 1512) Pulse Rate:  [92-96] 92 (12/29 1512) Resp:  [19] 19 (12/29 1512) BP: (80-127)/(42-50) 127/42 mmHg (12/29 1512) Weight:  [125 lb 12.8 oz (57.063 kg)] 125 lb 12.8 oz (57.063 kg) (12/29 1310)   General:  Pleasant, well-developed, elderly WF  in NAD Head:  Normocephalic and atraumatic. Eyes:  Sclera clear, no icterus.   Conjunctiva pink. Ears:  Normal auditory acuity. Mouth:  No deformity or lesions.  Neck:  Supple; no masses . Lungs:  Clear throughout to auscultation.   No wheezes, crackles, or rhonchi. No acute distress. Heart:  Regular rate and rhythm; no murmurs. Abdomen:  Soft, nondistended, nontender. No masses, hepatomegaly. No obvious masses.  Normal bowel .    Rectal:  Not done Msk:  Symmetrical without gross deformities.. Pulses:  Normal pulses noted. Extremities:  Without edema. Neurologic:  Alert and  oriented x4;   grossly normal neurologically. Skin:  Intact without significant lesions or rashes. Cervical Nodes:  No significant cervical adenopathy. Psych:  Alert and cooperative. Normal mood and affect.  Lab Results:  Recent Labs  12/17/15 0648 12/18/15 1254  WBC 13.1* 11.3*  HGB 10.0* 9.5*  HCT 29.8* 28.5*  PLT 278 284.0   BMET  Recent Labs  12/17/15 0648 12/18/15 1254  NA 130* 133*  K 3.3* 3.3*  CL 93* 96  CO2 28 27  GLUCOSE 130* 105*  BUN 14 12  CREATININE 0.65 0.67  CALCIUM 8.7* 9.4   LFT  Recent Labs  12/17/15 0648  PROT 6.6  ALBUMIN 4.0  AST 20  ALT 14  ALKPHOS 39  BILITOT 0.4   PT/INR No results for input(s): LABPROT, INR in the last 72 hours. Hepatitis Panel No results for input(s): HEPBSAG, HCVAB, HEPAIGM, HEPBIGM in the last 72 hours.  Studies/Results: Ct Abdomen Pelvis W Contrast  12/17/2015  CLINICAL DATA:  Rectal bleeding for 2 days, bright red blood, history diverticulosis with bleeding, gastritis, hypertension, hiatal hernia, hyperlipidemia EXAM: CT ABDOMEN AND PELVIS WITH CONTRAST TECHNIQUE: Multidetector CT imaging of the abdomen and pelvis was performed using the standard protocol following bolus administration of intravenous contrast. CONTRAST:  37mL OMNIPAQUE IOHEXOL 300 MG/ML SOLN, 153mL OMNIPAQUE IOHEXOL 300 MG/ML SOLN COMPARISON:  CT abdomen and pelvis 10/29/2014 FINDINGS: Probable BILATERAL posterior diaphragmatic defects of Bochdalek type containing herniated fat. Two liver cysts largest caudate lobe 3.2 x 2.6 cm image 14. Gallbladder surgically absent. Liver, spleen, pancreas, kidneys, and adrenal glands normal. Diffuse colonic diverticulosis without wall thickening or pericolic inflammatory changes to suggest acute diverticulitis. Colon unopacified and under distended most notably at the splenic flexure and transverse colon. Normal appendix. Small hiatal hernia. Stomach and bowel loops otherwise  normal appearance. Normal appearing bladder, ureters,  uterus and adnexa. No mass, adenopathy, free air, free fluid, or inflammatory process. Bones demineralized without focal abnormality. IMPRESSION: Extensive colonic diverticulosis without definite evidence of diverticulitis. Hepatic cysts. 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 Small hiatal hernia. Electronically Signed   By: Lavonia Dana M.D.   On: 12/17/2015 08:54    Impression / Plan:   #1 81 yo female with slow lower Gi bleed x 4 days consistent with diverticular bleed #2 hx of recurrent diverticular bleeding and known pandiverticular  Disease #3 anemia secondary to acute blood loss #4 hypotension- felt secondary to gi bleeding and antihypertensives #5 s/p GB  #6 GERD  #7hypokalemia  Plan; Admit to GI service Hydrate,replace k+ Hold BP meds, hold ASA  Serial hgbs and transfuse as indicated for hgb 8 or less  Liquid diet Bowel prep this evening to clear bowel- if continued evidence for bleed may proceed with colonoscopy tomorrow         LOS: 0 days   Armel Rabbani  12/18/2015, 3:18 PM

## 2015-12-18 NOTE — Patient Instructions (Signed)
You are being admitted to [place] to the front door of the hospital, go to registration and they will send you to admitting. Amy suggests you have your driver get a wheel chair to take you in the building.  You will be in room 1533. This is on the 5th floor. [place] section of the building.

## 2015-12-18 NOTE — Progress Notes (Signed)
Patient ID: TENEKA Shah, female   DOB: 11-Jun-1934, 81 y.o.   MRN: [id number]   Subjective:    Patient ID: Terri Shah, female    DOB: July 29, 1934, 81 y.o.   MRN: [id number]  HPI  Terri Shah is a very nice 81 year old white female seen in the office today after emergency room  Visit yesterday  With complaints of three-day history of rectal bleeding. Patient was seen by the ER physician yesterday. Hemoglobin was 10 hematocrit of 29.8. CT of the abdomen and pelvis was done which was negative with the exception of extensive diverticular disease. She was felt to be stable and admission was not recommended. We were contacted for early office follow-up.   patient relates multiple episodes of diverticular bleeding over the past 24 years. She says she is only required 2 admissions and has never required transfusion. Her last episode was September 2015. She says this episode is very similar started on Monday alt 26 late in the evening. She had 1-2 episodes of bright red blood per rectum on Tuesday on Wednesday a.m. About 3 AM had another episode of bright red blood followed by one more more in the early morning hours. She then presented to the emergency room for evaluation.  Since discharge from the ER yesterday she had one small episode last evening and then 1 episode this morning which she said was a smaller amount of blood with a lot of clots. She said she felt a bit weak yesterday but today feels weaker and is very lightheaded with ambulation.  She stopped her baby aspirin on Monday but has continued taking 2 blood pressure medications, benazepril and HCTZ and took both this morning as well. In the office blood pressure is 80/50 pulse 96  Repeat labs today potassium 3.3 WBC 11.3 hemoglobin 9.5 hematocrit of 28.5.  Last colonoscopy was done 2011 per Dr. Olevia Perches showing severe pandiverticulosis .EGD in 2011 small hiatal hernia and distal stricture dilated and she also had a capsule endoscopy in 2011 for iron  deficiency anemia which was negative.  Review of Systems Pertinent positive and negative review of systems were noted in the above HPI section.  All other review of systems was otherwise negative.  Outpatient Encounter Prescriptions as of 12/18/2015  Medication Sig  . acetaminophen (TYLENOL) 500 MG tablet Take 500 mg by mouth every 6 (six) hours as needed. Patient used this medication for her pain.  Marland Kitchen amitriptyline (ELAVIL) 25 MG tablet Take 25 mg by mouth at bedtime as needed for sleep.   Marland Kitchen aspirin EC 81 MG tablet Take 81 mg by mouth daily.  Marland Kitchen atorvastatin (LIPITOR) 20 MG tablet Take 20 mg by mouth daily.    . benazepril (LOTENSIN) 40 MG tablet Take 40 mg by mouth daily.  . cholecalciferol (VITAMIN D) 1000 UNITS tablet Take 1,000 Units by mouth daily.    Marland Kitchen denosumab (PROLIA) 60 MG/ML SOLN injection Inject 60 mg into the skin every 6 (six) months. Administer in upper arm, thigh, or abdomen  . dorzolamide-timolol (COSOPT) 22.3-6.8 MG/ML ophthalmic solution Place 1 drop into the right eye 2 (two) times daily.   . hydrochlorothiazide (HYDRODIURIL) 25 MG tablet Take 25 mg by mouth every other day.   Marland Kitchen omeprazole (PRILOSEC) 20 MG capsule Take 20 mg by mouth daily.   . polyvinyl alcohol (LIQUIFILM TEARS) 1.4 % ophthalmic solution Place 1 drop into both eyes daily as needed for dry eyes.  . Psyllium (METAMUCIL PO) Take 1 each by mouth at bedtime.  Marland Kitchen  Saline 2.65 % SOLN Place 1 each into the nose daily as needed (congestion).  . traMADol (ULTRAM) 50 MG tablet Take 50 mg by mouth every 6 (six) hours as needed for moderate pain.    No facility-administered encounter medications on file as of 12/18/2015.   Allergies  Allergen Reactions  . Codeine Other (See Comments)    "Perception off"    Patient Active Problem List   Diagnosis Date Noted  . Nausea with vomiting 12/02/2014  . Ileus, postoperative 11/04/2014  . Hyponatremia 11/04/2014  . Abdominal fluid collection   . Chest pain 10/26/2014  .  Acute calculous cholecystitis 10/26/2014  . Barrett esophagus   . History of GI diverticular bleed   . Hypertension   . Dyspnea 01/18/2012  . Aortic insufficiency 01/18/2012  . ANEMIA, IRON DEFICIENCY 06/04/2010  . HYPERCHOLESTEROLEMIA 02/08/2008  . GERD 02/08/2008  . ARTHRITIS 02/08/2008  . HIATAL HERNIA 11/02/2007  . DIVERTICULOSIS OF COLON 04/07/2004   Social History   Social History  . Marital Status: Widowed    Spouse Name: N/A  . Number of Children: 3  . Years of Education: N/A   Occupational History  . Not on file.   Social History Main Topics  . Smoking status: Never Smoker   . Smokeless tobacco: Never Used  . Alcohol Use: Yes     Comment: occasional  . Drug Use: No  . Sexual Activity: Not on file   Other Topics Concern  . Not on file   Social History Narrative    Terri Shah's family history includes Breast cancer in her sister; Diabetes in her sister; Heart disease in her father and mother; Kidney disease in her sister; Stroke in her mother. There is no history of Colon cancer.      Objective:    Filed Vitals:   12/18/15 1310  BP: 80/50  Pulse: 96    Physical Exam   Well-developed elderly white female in no acute distress , pleasant , blood pressure 80/50 pulse 96. HEENT; nontraumatic normocephalic EOMI PERRLA sclera anicteric, Cardiovascular; regular rate and rhythm with S1-S2 no murmur or gallop, Pulmonary clear bilaterally, Abdomen ;soft nontender nondistended bowel sounds are active there is no palpable mass or hepatosplenomegaly, Rectal ;exam not done, Extremities; no clubbing cyanosis or edema skin warm and dry, Neuropsych mood and affect appropriate       Assessment & Plan:   #13  81 -year-old female with slow stuttering diverticular bleed over the past 4 days very similar to prior episodes of diverticular bleeding.  #2 anemia acute secondary to blood loss  #3 hypotension- likely combination of blood loss and antihypertensives  #4  Status  post laparoscopic cholecystectomy [id number] complicated by small bowel obstruction   Plan; Pt will be admitted to the GI service today for observation , rehydration and serial hemoglobins and transfusion as indicated. Will hold antihypertensives.  Bowel prep planned and if persistent bleeding May proceed with colonoscopy tomorrow.  Discussed with Dr. Havery Moros who is covering the hospital.  Terri Ferguson PA-C 12/18/2015   Cc: Shon Baton, MD

## 2015-12-18 NOTE — Progress Notes (Signed)
Agree with assessment and plan as outlined.  

## 2015-12-19 ENCOUNTER — Encounter (HOSPITAL_COMMUNITY): Payer: Self-pay

## 2015-12-19 ENCOUNTER — Encounter (HOSPITAL_COMMUNITY)
Admission: AD | Disposition: A | Discharge status: Home / Self Care | Payer: Commercial Managed Care - HMO | Source: Ambulatory Visit | Attending: Gastroenterology

## 2015-12-19 DIAGNOSIS — D120 Benign neoplasm of cecum: Secondary | ICD-10-CM

## 2015-12-19 DIAGNOSIS — D126 Benign neoplasm of colon, unspecified: Secondary | ICD-10-CM

## 2015-12-19 DIAGNOSIS — K635 Polyp of colon: Secondary | ICD-10-CM | POA: Insufficient documentation

## 2015-12-19 HISTORY — PX: COLONOSCOPY: SHX5424

## 2015-12-19 LAB — CBC WITH DIFFERENTIAL/PLATELET
Basophils Absolute: 0 10*3/uL (ref 0.0–0.1)
Basophils Relative: 0 %
Eosinophils Absolute: 0 10*3/uL (ref 0.0–0.7)
Eosinophils Relative: 0 %
HCT: 22.4 % — ABNORMAL LOW (ref 36.0–46.0)
Hemoglobin: 7.8 g/dL — ABNORMAL LOW (ref 12.0–15.0)
Lymphocytes Relative: 12 %
Lymphs Abs: 1.1 10*3/uL (ref 0.7–4.0)
MCH: 31.5 pg (ref 26.0–34.0)
MCHC: 34.8 g/dL (ref 30.0–36.0)
MCV: 90.3 fL (ref 78.0–100.0)
Monocytes Absolute: 0.8 10*3/uL (ref 0.1–1.0)
Monocytes Relative: 8 %
Neutro Abs: 7.5 10*3/uL (ref 1.7–7.7)
Neutrophils Relative %: 80 %
Platelets: 204 10*3/uL (ref 150–400)
RBC: 2.48 MIL/uL — ABNORMAL LOW (ref 3.87–5.11)
RDW: 12 % (ref 11.5–15.5)
WBC: 9.4 10*3/uL (ref 4.0–10.5)

## 2015-12-19 LAB — BASIC METABOLIC PANEL
Anion gap: 10 (ref 5–15)
BUN: 7 mg/dL (ref 6–20)
CO2: 22 mmol/L (ref 22–32)
Calcium: 8.3 mg/dL — ABNORMAL LOW (ref 8.9–10.3)
Chloride: 99 mmol/L — ABNORMAL LOW (ref 101–111)
Creatinine, Ser: 0.58 mg/dL (ref 0.44–1.00)
GFR calc Af Amer: >60 mL/min (ref >60)
GFR calc non Af Amer: >60 mL/min (ref >60)
Glucose, Bld: 97 mg/dL (ref 65–99)
Potassium: 3.2 mmol/L — ABNORMAL LOW (ref 3.5–5.1)
Sodium: 131 mmol/L — ABNORMAL LOW (ref 135–145)

## 2015-12-19 SURGERY — COLONOSCOPY
Anesthesia: Moderate Sedation

## 2015-12-19 MED ORDER — MIDAZOLAM HCL 5 MG/5ML IJ SOLN
INTRAMUSCULAR | Status: DC | PRN
  Administered 2015-12-19: 2 mg via INTRAVENOUS
  Administered 2015-12-19 (×2): 1 mg via INTRAVENOUS
  Administered 2015-12-19: 2 mg via INTRAVENOUS
  Administered 2015-12-19: 1 mg via INTRAVENOUS

## 2015-12-19 MED ORDER — KCL IN DEXTROSE-NACL 30-5-0.45 MEQ/L-%-% IV SOLN
INTRAVENOUS | Status: DC
  Administered 2015-12-19: 09:00:00 via INTRAVENOUS
  Filled 2015-12-19: qty 1000

## 2015-12-19 MED ORDER — FENTANYL CITRATE (PF) 100 MCG/2ML IJ SOLN
INTRAMUSCULAR | Status: AC
  Filled 2015-12-19: qty 2

## 2015-12-19 MED ORDER — FENTANYL CITRATE (PF) 100 MCG/2ML IJ SOLN
INTRAMUSCULAR | Status: DC | PRN
  Administered 2015-12-19 (×3): 25 ug via INTRAVENOUS

## 2015-12-19 MED ORDER — POTASSIUM CHLORIDE CRYS ER 20 MEQ PO TBCR
40.0000 meq | EXTENDED_RELEASE_TABLET | Freq: Once | ORAL | Status: AC
  Administered 2015-12-19: 40 meq via ORAL
  Filled 2015-12-19 (×2): qty 2

## 2015-12-19 MED ORDER — MIDAZOLAM HCL 5 MG/ML IJ SOLN
INTRAMUSCULAR | Status: AC
  Filled 2015-12-19: qty 2

## 2015-12-19 NOTE — Op Note (Signed)
[facility] [place], [address]   COLONOSCOPY PROCEDURE REPORT  PATIENT: Terri Shah, Terri Shah  MR#: [id number] BIRTHDATE: Apr 29, 1934 , 81  yrs. old GENDER: female ENDOSCOPIST: Yetta Flock, MD REFERRED BY: PROCEDURE DATE:  12/19/2015 PROCEDURE:   Colonoscopy, diagnostic, Colonoscopy with snare polypectomy, and Colonoscopy with biopsy First Screening Colonoscopy - Avg.  risk and is 50 yrs.  old or older - No.  Prior Negative Screening - Now for repeat screening. Other: See Comments  History of Adenoma - Now for follow-up colonoscopy & has been > or = to 3 yrs.  N/A  Polyps removed today? Yes ASA CLASS:   Class III INDICATIONS:Evaluation of unexplained GI bleeding and Colorectal Neoplasm Risk Assessment for this procedure is average risk. MEDICATIONS: Fentanyl 75 mcg IV and Versed 7 mg IV  DESCRIPTION OF PROCEDURE:   After the risks benefits and alternatives of the procedure were thoroughly explained, informed consent was obtained.  The digital rectal exam revealed no abnormalities of the rectum.   The Pentax Ped Colon [id number] endoscope was introduced through the anus and advanced to the terminal ileum which was intubated for a short distance. No adverse events experienced.   The quality of the prep was adequate  The instrument was then slowly withdrawn as the colon was fully examined. Estimated blood loss is zero unless otherwise noted in this procedure report.      COLON FINDINGS: There was pancolonic diverticulosis noted.  Severe in the left colon with multiple angulated turns, making for a prolonged cecal intubation.  Moderate diverticulosis noted in the right colon.  No red blood or old blood appreciated.  There was a 28mm sessile cecal polyp noted that was removed via cold snare. Another 5-42mm sessile polyp was noted along the back side of the IC valve removed with cold snare, and another 41mm sessile polyp along the back side of the IC  valve removed with cold forceps.  The terminal ileum was intubated and normal without blood.  None of the diverticula had stigmata of recent bleeding and time was taken to inspect them all.  Following retroflexion a small mucosal wrent was noted in the distal rectum just proximal to the dentate line with some small oozing which resolved with observation.  Retroflexed views revealed no abnormalities. The time to cecum = 6.7 Withdrawal time = 35.4   The scope was withdrawn and the procedure completed. COMPLICATIONS: There were no immediate complications.  ENDOSCOPIC IMPRESSION: Pancolonic diverticulosis, severe in the left colon and moderate in the right colon. No evidence of active bleeding noted. Diverticulum were all inspected and none had high risk stigmata for bleeding to treat 3 polyps removed in the right colon as outlined above Overall, most likely diverticular bleed, unclear which location given bleeding has stopped, suspect left sided.  RECOMMENDATIONS: Return to the medical ward Clear liquid diet Repeat CBC tomorrow AM if no evidence of bleeding throughout today Call with any evidence of further bleeding today. If the patient rebleeds would recommend tagged RBC scan to localize which part of the colon is bleeding.  eSigned:  Yetta Flock, MD 12/19/2015 12:08 PM   cc: the patient   PATIENT NAME:  Terri Shah, Terri Shah MR#: [id number]

## 2015-12-19 NOTE — Interval H&P Note (Signed)
History and Physical Interval Note:  12/19/2015 10:37 AM  Terri Shah  has presented today for surgery, with the diagnosis of GI bleed  The various methods of treatment have been discussed with the patient and family. After consideration of risks, benefits and other options for treatment, the patient has consented to  Procedure(s): COLONOSCOPY (N/A) as a surgical intervention .  The patient's history has been reviewed, patient examined, no change in status, stable for surgery.  I have reviewed the patient's chart and labs.  Questions were answered to the patient's satisfaction.     [name]

## 2015-12-19 NOTE — Progress Notes (Signed)
1220 pts B/P low Dr.Armbruster aware, fluid challenge 500cc ordered and started.

## 2015-12-19 NOTE — Progress Notes (Signed)
Patient ID: Terri Shah, female   DOB: 1934/04/18, 81 y.o.   MRN: [id number]    Progress Note   Subjective   tired, up all night- finished prep around 4 am- passed red blood through most of prep - cleared by early am hours HGB down to 7.8 this am She feels ok- denies dizziness etc    Objective   Vital signs in last 24 hours: Temp:  [98.1 F (36.7 C)-98.4 F (36.9 C)] 98.4 F (36.9 C) (12/30 0515) Pulse Rate:  [71-96] 71 (12/30 0515) Resp:  [14-19] 14 (12/30 0515) BP: (80-149)/(42-69) 123/47 mmHg (12/30 0515) SpO2:  [94 %-100 %] 94 % (12/30 0515) Weight:  [125 lb 12.8 oz (57.063 kg)] 125 lb 12.8 oz (57.063 kg) (12/29 2009) Last BM Date: 12/18/15 General:  Elderly   white female in NAD Heart:  Regular rate and rhythm; no murmurs Lungs: Respirations even and unlabored, lungs CTA bilaterally Abdomen:  Soft, nontender and nondistended. Normal bowel sounds. Extremities:  Without edema. Neurologic:  Alert and oriented,  grossly normal neurologically. Psych:  Cooperative. Normal mood and affect.  Intake/Output from previous day: 12/29 0701 - 12/30 0700 In: -  Out: 300 [Urine:300] Intake/Output this shift:    Lab Results:  Recent Labs  12/17/15 0648 12/18/15 1254 12/18/15 2000 12/19/15 0512  WBC 13.1* 11.3*  --  9.4  HGB 10.0* 9.5* 8.9* 7.8*  HCT 29.8* 28.5*  --  22.4*  PLT 278 284.0  --  204   BMET  Recent Labs  12/18/15 1254 12/18/15 1625 12/19/15 0512  NA 133* 135 131*  K 3.3* 3.2* 3.2*  CL 96 98* 99*  CO2 27 28 22   GLUCOSE 105* 89 97  BUN 12 11 7   CREATININE 0.67 0.71 0.58  CALCIUM 9.4 9.2 8.3*   LFT  Recent Labs  12/18/15 1625  PROT 6.0*  ALBUMIN 3.7  AST 17  ALT 13*  ALKPHOS 34*  BILITOT 0.8   PT/INR  Recent Labs  12/18/15 1625  LABPROT 14.1  INR 1.07    Studies/Results: Ct Abdomen Pelvis W Contrast  12/17/2015  CLINICAL DATA:  Rectal bleeding for 2 days, bright red blood, history diverticulosis with bleeding, gastritis,  hypertension, hiatal hernia, hyperlipidemia EXAM: CT ABDOMEN AND PELVIS WITH CONTRAST TECHNIQUE: Multidetector CT imaging of the abdomen and pelvis was performed using the standard protocol following bolus administration of intravenous contrast. CONTRAST:  5mL OMNIPAQUE IOHEXOL 300 MG/ML SOLN, 117mL OMNIPAQUE IOHEXOL 300 MG/ML SOLN COMPARISON:  CT abdomen and pelvis 10/29/2014 FINDINGS: Probable BILATERAL posterior diaphragmatic defects of Bochdalek type containing herniated fat. Two liver cysts largest caudate lobe 3.2 x 2.6 cm image 14. Gallbladder surgically absent. Liver, spleen, pancreas, kidneys, and adrenal glands normal. Diffuse colonic diverticulosis without wall thickening or pericolic inflammatory changes to suggest acute diverticulitis. Colon unopacified and under distended most notably at the splenic flexure and transverse colon. Normal appendix. Small hiatal hernia. Stomach and bowel loops otherwise normal appearance. Normal appearing bladder, ureters, uterus and adnexa. No mass, adenopathy, free air, free fluid, or inflammatory process. Bones demineralized without focal abnormality. IMPRESSION: Extensive colonic diverticulosis without definite evidence of diverticulitis. Hepatic cysts. 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 Small hiatal hernia. Electronically Signed   By: Lavonia Dana M.D.   On: 12/17/2015 08:54       Assessment / Plan:    #1 81 yo WF with recurrent lower GI bleed- very likely diverticular- last colon 2011  Pt actively bleeding yesterday accounting for hypotension/dizziness- has  cleared with bowel prep as of this am Will discuss proceeding with colonoscopy , pt agreeable #2 anemia -secondary to acute blood loss- transfuse if drifts below 7.5 #3 hx HTN- off BP meds for now #4 hypokalemia- had been on diuretic- replacing with IV fluids, add oral supp  Active Problems:   Lower GI bleed   Diverticulosis of colon with hemorrhage     LOS: 1 day    Kholton Coate  12/19/2015, 8:28 AM

## 2015-12-19 NOTE — Progress Notes (Signed)
Utilization review completed. Tionna Gigante, RN, BSN. 

## 2015-12-20 LAB — BASIC METABOLIC PANEL
Anion gap: 7 (ref 5–15)
BUN: <5 mg/dL — ABNORMAL LOW (ref 6–20)
CO2: 24 mmol/L (ref 22–32)
Calcium: 8.2 mg/dL — ABNORMAL LOW (ref 8.9–10.3)
Chloride: 107 mmol/L (ref 101–111)
Creatinine, Ser: 0.49 mg/dL (ref 0.44–1.00)
GFR calc Af Amer: >60 mL/min (ref >60)
GFR calc non Af Amer: >60 mL/min (ref >60)
Glucose, Bld: 80 mg/dL (ref 65–99)
Potassium: 3.7 mmol/L (ref 3.5–5.1)
Sodium: 138 mmol/L (ref 135–145)

## 2015-12-20 LAB — CBC
HCT: 24.4 % — ABNORMAL LOW (ref 36.0–46.0)
Hemoglobin: 8.3 g/dL — ABNORMAL LOW (ref 12.0–15.0)
MCH: 31.7 pg (ref 26.0–34.0)
MCHC: 34 g/dL (ref 30.0–36.0)
MCV: 93.1 fL (ref 78.0–100.0)
Platelets: 243 10*3/uL (ref 150–400)
RBC: 2.62 MIL/uL — ABNORMAL LOW (ref 3.87–5.11)
RDW: 12.7 % (ref 11.5–15.5)
WBC: 5.4 10*3/uL (ref 4.0–10.5)

## 2015-12-20 NOTE — Discharge Summary (Signed)
[facility] Discharge Summary  Name: Terri Shah MRN: [id number] DOB: [birth year] 81 y.o. PCP:  Precious Reel, MD  Date of Admission: 12/18/2015  2:55 PM Date of Discharge: 12/20/2015 Attending Physician: [name], *  Discharge Diagnosis: Active Problems:   Lower GI bleed   Diverticulosis of colon with hemorrhage   Colon polyp  Anemia secondary to acute blood loss  hypokalemia  Consultations: none Procedures Performed:  Ct Abdomen Pelvis W Contrast  12/17/2015  CLINICAL DATA:  Rectal bleeding for 2 days, bright red blood, history diverticulosis with bleeding, gastritis, hypertension, hiatal hernia, hyperlipidemia EXAM: CT ABDOMEN AND PELVIS WITH CONTRAST TECHNIQUE: Multidetector CT imaging of the abdomen and pelvis was performed using the standard protocol following bolus administration of intravenous contrast. CONTRAST:  46mL OMNIPAQUE IOHEXOL 300 MG/ML SOLN, 163mL OMNIPAQUE IOHEXOL 300 MG/ML SOLN COMPARISON:  CT abdomen and pelvis 10/29/2014 FINDINGS: Probable BILATERAL posterior diaphragmatic defects of Bochdalek type containing herniated fat. Two liver cysts largest caudate lobe 3.2 x 2.6 cm image 14. Gallbladder surgically absent. Liver, spleen, pancreas, kidneys, and adrenal glands normal. Diffuse colonic diverticulosis without wall thickening or pericolic inflammatory changes to suggest acute diverticulitis. Colon unopacified and under distended most notably at the splenic flexure and transverse colon. Normal appendix. Small hiatal hernia. Stomach and bowel loops otherwise normal appearance. Normal appearing bladder, ureters, uterus and adnexa. No mass, adenopathy, free air, free fluid, or inflammatory process. Bones demineralized without focal abnormality. IMPRESSION: Extensive colonic diverticulosis without definite evidence of diverticulitis. Hepatic cysts. 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 Small hiatal hernia.  Electronically Signed   By: Lavonia Dana M.D.   On: 12/17/2015 08:54    GI Procedures:  Colonoscopy with polypectomy  History/Physical Exam:  See Admission H&P    Hospital Course by problem list:   &81 yo female admitted with acute lower GI bleed x 4 days. Pt presented with hgb 9.5 and dizzy in office. Found Hypotensive with Bp 80/50. She had been taking Benazepril and HCTZ.  Hx of previous Diverticular bleeds and known Pan-divertuculosis.  Pt had bowel prep on admit , and bleeding had cleared at end of prep. Hgb dropped to 7.8, did not require transfusion.  She underwent Colonoscopy on 12/30 with Dr Havery Moros with finding of pan-diverticulosis, no blood in colon. 3 polyps removed  from right colon.  Pt had very benign course, as bleeding had resolved and hgb up to 8.3 the following morning she is allowed discharge to home.  K + was low on admit and corrected  She will resume soft diet, stay off ASA x 2 weeks, hold HCTZ , and restart Benazepril in 48 hours. She will follow up With Dr Delsa Bern her PCP in a week to discuss BP meds, and can have HGB rechecked at that time.   Discharge Vitals:  BP 125/49 mmHg  Pulse 68  Temp(Src) 98.7 F (37.1 C) (Oral)  Resp 18  Ht 5\' 1"  (1.549 m)  Wt 125 lb 12.8 oz (57.063 kg)  BMI 23.78 kg/m2  SpO2 98%  Discharge Labs:  Results for orders placed or performed during the hospital encounter of 12/18/15 (from the past 24 hour(s))  CBC Once     Status: Abnormal   Collection Time: 12/20/15  7:26 AM  Result Value Ref Range   WBC 5.4 4.0 - 10.5 K/uL   RBC 2.62 (L) 3.87 - 5.11 MIL/uL   Hemoglobin 8.3 (L) 12.0 - 15.0 g/dL   HCT 24.4 (L) 36.0 - 46.0 %  MCV 93.1 78.0 - 100.0 fL   MCH 31.7 26.0 - 34.0 pg   MCHC 34.0 30.0 - 36.0 g/dL   RDW 12.7 11.5 - 15.5 %   Platelets 243 150 - 400 K/uL  Basic metabolic panel Once     Status: Abnormal   Collection Time: 12/20/15  7:26 AM  Result Value Ref Range   Sodium 138 135 - 145 mmol/L   Potassium 3.7 3.5 - 5.1  mmol/L   Chloride 107 101 - 111 mmol/L   CO2 24 22 - 32 mmol/L   Glucose, Bld 80 65 - 99 mg/dL   BUN <5 (L) 6 - 20 mg/dL   Creatinine, Ser 0.49 0.44 - 1.00 mg/dL   Calcium 8.2 (L) 8.9 - 10.3 mg/dL   GFR calc non Af Amer >60 >60 mL/min   GFR calc Af Amer >60 >60 mL/min   Anion gap 7 5 - 15    Disposition and follow-up:   Ms.Terri Shah was discharged from [facility] in stable condition.    Follow-up Appointments: Pt will make follow up with Dr Virgina Jock for one week Follow up Dr Havery Moros as needed   Discharge Medications:   Medication List    STOP taking these medications        aspirin EC 81 MG tablet      TAKE these medications        acetaminophen 500 MG tablet  Commonly known as:  TYLENOL  Take 500 mg by mouth every 6 (six) hours as needed. Patient used this medication for her pain.     amitriptyline 25 MG tablet  Commonly known as:  ELAVIL  Take 25 mg by mouth at bedtime as needed for sleep.     atorvastatin 20 MG tablet  Commonly known as:  LIPITOR  Take 20 mg by mouth daily.     benazepril 40 MG tablet  Commonly known as:  LOTENSIN  Take 40 mg by mouth daily.     calcium carbonate 750 MG chewable tablet  Commonly known as:  TUMS EX  Chew 1 tablet by mouth daily as needed for heartburn.     cholecalciferol 1000 units tablet  Commonly known as:  VITAMIN D  Take 1,000 Units by mouth daily.     denosumab 60 MG/ML Soln injection  Commonly known as:  PROLIA  Inject 60 mg into the skin every 6 (six) months. Administer in upper arm, thigh, or abdomen     dorzolamide-timolol 22.3-6.8 MG/ML ophthalmic solution  Commonly known as:  COSOPT  Place 1 drop into the right eye 2 (two) times daily.     hydrochlorothiazide 25 MG tablet  Commonly known as:  HYDRODIURIL  Take 25 mg by mouth every other day.     METAMUCIL PO  Take 1 each by mouth at bedtime.     omeprazole 20 MG capsule  Commonly known as:  PRILOSEC  Take 20 mg by mouth daily.      polyvinyl alcohol 1.4 % ophthalmic solution  Commonly known as:  LIQUIFILM TEARS  Place 1 drop into both eyes daily as needed for dry eyes.     Saline 2.65 % Soln  Place 1 each into the nose daily as needed (congestion).     SALONPAS GEL EX  Apply 1 application topically daily as needed (pain).     SALONPAS Pads  Apply 1-4 each topically daily as needed (pain).     traMADol 50 MG tablet  Commonly known as:  ULTRAM  Take 50 mg by mouth every 6 (six) hours as needed for moderate pain.        Signed: Nicoletta Ba 12/20/2015, 8:52 AM

## 2015-12-20 NOTE — Discharge Instructions (Signed)
No aspirin x 2 weeks, Stay off HCTZ until you see Dr Virgina Jock Have your blood counts checked in a week

## 2015-12-20 NOTE — Progress Notes (Signed)
Assessment unchanged. Pt verbalized understanding of dc instructions through teach back regarding follow up care and when to call the doctor. No scripts at dc. Discharged via wc to front entrance to meet awaiting vehicle to carry home. Accompanied by NT and son.

## 2015-12-20 NOTE — Progress Notes (Signed)
Patient ID: Terri Shah, female   DOB: Aug 07, 1934, 81 y.o.   MRN: [id number]    Progress Note   Subjective  Doing well-slept most of the day after procedure yesterday-feels much better No bleeding, no dizziness etc. Wants to go home. Hgb up to 8.3    Objective   Vital signs in last 24 hours: Temp:  [97.7 F (36.5 C)-98.7 F (37.1 C)] 98.7 F (37.1 C) (12/31 0600) Pulse Rate:  [67-82] 68 (12/31 0600) Resp:  [14-22] 18 (12/31 0600) BP: (59-166)/(31-87) 125/49 mmHg (12/31 0600) SpO2:  [92 %-100 %] 98 % (12/31 0600) Last BM Date: 12/19/15 General:    Elderly white female in NAD Heart:  Regular rate and rhythm; no murmurs Lungs: Respirations even and unlabored, lungs CTA bilaterally Abdomen:  Soft, nontender and nondistended. Normal bowel sounds. Extremities:  Without edema. Neurologic:  Alert and oriented,  grossly normal neurologically. Psych:  Cooperative. Normal mood and affect.  Intake/Output from previous day: 12/30 0701 - 12/31 0700 In: 740 [P.O.:240; I.V.:500] Out: -  Intake/Output this shift:    Lab Results:  Recent Labs  12/18/15 1254 12/18/15 2000 12/19/15 0512 12/20/15 0726  WBC 11.3*  --  9.4 5.4  HGB 9.5* 8.9* 7.8* 8.3*  HCT 28.5*  --  22.4* 24.4*  PLT 284.0  --  204 243   BMET  Recent Labs  12/18/15 1625 12/19/15 0512 12/20/15 0726  NA 135 131* 138  K 3.2* 3.2* 3.7  CL 98* 99* 107  CO2 28 22 24   GLUCOSE 89 97 80  BUN 11 7 <5*  CREATININE 0.71 0.58 0.49  CALCIUM 9.2 8.3* 8.2*   LFT  Recent Labs  12/18/15 1625  PROT 6.0*  ALBUMIN 3.7  AST 17  ALT 13*  ALKPHOS 34*  BILITOT 0.8   PT/INR  Recent Labs  12/18/15 1625  LABPROT 14.1  INR 1.07    Studies/Results: No results found.     Assessment / Plan:    #1 81 yo female with acute recurrent diverticular bleed-resolved #2 anemia- secondary to acute blood loss #3 Colon polyps X3- removed-path pending #4 Hypotension - resolved #5 hypokalemia  Plan; Discharge home  today No ASA x 2 weeks  Hold HCTZ until sees her primary- will resume benazepril at home Advance diet this am She plans to follow up with dr Virgina Jock regarding her BP meds etc and will have her hgb rechecked at his office in a week  Active Problems:   Lower GI bleed   Diverticulosis of colon with hemorrhage   Colon polyp     LOS: 2 days   Kjell Brannen  12/20/2015, 8:30 AM

## 2015-12-22 LAB — TYPE AND SCREEN
ABO/RH(D): O NEG
Antibody Screen: NEGATIVE
Unit division: 0
Unit division: 0

## 2015-12-23 ENCOUNTER — Encounter (HOSPITAL_COMMUNITY): Payer: Self-pay | Admitting: Gastroenterology

## 2016-04-06 ENCOUNTER — Other Ambulatory Visit (HOSPITAL_COMMUNITY): Payer: Self-pay | Admitting: Internal Medicine

## 2016-04-19 ENCOUNTER — Ambulatory Visit (HOSPITAL_COMMUNITY)
Admission: RE | Admit: 2016-04-19 | Discharge: 2016-04-19 | Disposition: A | Discharge status: Home / Self Care | Payer: Commercial Managed Care - HMO | Source: Ambulatory Visit | Attending: Internal Medicine | Admitting: Internal Medicine

## 2016-04-19 ENCOUNTER — Encounter (HOSPITAL_COMMUNITY): Payer: Self-pay

## 2016-04-19 DIAGNOSIS — M810 Age-related osteoporosis without current pathological fracture: Secondary | ICD-10-CM | POA: Insufficient documentation

## 2016-04-19 MED ORDER — DENOSUMAB 60 MG/ML ~~LOC~~ SOLN
60.0000 mg | Freq: Once | SUBCUTANEOUS | Status: AC
  Administered 2016-04-19: 60 mg via SUBCUTANEOUS
  Filled 2016-04-19: qty 1

## 2016-04-19 NOTE — Discharge Instructions (Signed)
Denosumab injection  What is this medicine?  DENOSUMAB (den oh sue mab) slows bone breakdown. Prolia is used to treat osteoporosis in women after menopause and in men. Xgeva is used to prevent bone fractures and other bone problems caused by cancer bone metastases. Xgeva is also used to treat giant cell tumor of the bone.  This medicine may be used for other purposes; ask your health care provider or pharmacist if you have questions.  What should I tell my health care provider before I take this medicine?  They need to know if you have any of these conditions:  -dental disease  -eczema  -infection or history of infections  -kidney disease or on dialysis  -low blood calcium or vitamin D  -malabsorption syndrome  -scheduled to have surgery or tooth extraction  -taking medicine that contains denosumab  -thyroid or parathyroid disease  -an unusual reaction to denosumab, other medicines, foods, dyes, or preservatives  -pregnant or trying to get pregnant  -breast-feeding  How should I use this medicine?  This medicine is for injection under the skin. It is given by a health care professional in a hospital or clinic setting.  If you are getting Prolia, a special MedGuide will be given to you by the pharmacist with each prescription and refill. Be sure to read this information carefully each time.  For Prolia, talk to your pediatrician regarding the use of this medicine in children. Special care may be needed. For Xgeva, talk to your pediatrician regarding the use of this medicine in children. While this drug may be prescribed for children as young as 13 years for selected conditions, precautions do apply.  Overdosage: If you think you have taken too much of this medicine contact a poison control center or emergency room at once.  NOTE: This medicine is only for you. Do not share this medicine with others.  What if I miss a dose?  It is important not to miss your dose. Call your doctor or health care professional if you are  unable to keep an appointment.  What may interact with this medicine?  Do not take this medicine with any of the following medications:  -other medicines containing denosumab  This medicine may also interact with the following medications:  -medicines that suppress the immune system  -medicines that treat cancer  -steroid medicines like prednisone or cortisone  This list may not describe all possible interactions. Give your health care provider a list of all the medicines, herbs, non-prescription drugs, or dietary supplements you use. Also tell them if you smoke, drink alcohol, or use illegal drugs. Some items may interact with your medicine.  What should I watch for while using this medicine?  Visit your doctor or health care professional for regular checks on your progress. Your doctor or health care professional may order blood tests and other tests to see how you are doing.  Call your doctor or health care professional if you get a cold or other infection while receiving this medicine. Do not treat yourself. This medicine may decrease your body's ability to fight infection.  You should make sure you get enough calcium and vitamin D while you are taking this medicine, unless your doctor tells you not to. Discuss the foods you eat and the vitamins you take with your health care professional.  See your dentist regularly. Brush and floss your teeth as directed. Before you have any dental work done, tell your dentist you are receiving this medicine.  Do   not become pregnant while taking this medicine or for 5 months after stopping it. Women should inform their doctor if they wish to become pregnant or think they might be pregnant. There is a potential for serious side effects to an unborn child. Talk to your health care professional or pharmacist for more information.  What side effects may I notice from receiving this medicine?  Side effects that you should report to your doctor or health care professional as soon as  possible:  -allergic reactions like skin rash, itching or hives, swelling of the face, lips, or tongue  -breathing problems  -chest pain  -fast, irregular heartbeat  -feeling faint or lightheaded, falls  -fever, chills, or any other sign of infection  -muscle spasms, tightening, or twitches  -numbness or tingling  -skin blisters or bumps, or is dry, peels, or red  -slow healing or unexplained pain in the mouth or jaw  -unusual bleeding or bruising  Side effects that usually do not require medical attention (Report these to your doctor or health care professional if they continue or are bothersome.):  -muscle pain  -stomach upset, gas  This list may not describe all possible side effects. Call your doctor for medical advice about side effects. You may report side effects to FDA at 1-800-FDA-1088.  Where should I keep my medicine?  This medicine is only given in a clinic, doctor's office, or other health care setting and will not be stored at home.  NOTE: This sheet is a summary. It may not cover all possible information. If you have questions about this medicine, talk to your doctor, pharmacist, or health care provider.      2016, [employer]. (2012-06-05 12:37:47)

## 2016-05-19 ENCOUNTER — Other Ambulatory Visit: Payer: Self-pay | Admitting: Internal Medicine

## 2016-05-19 DIAGNOSIS — Z1231 Encounter for screening mammogram for malignant neoplasm of breast: Secondary | ICD-10-CM

## 2016-06-18 ENCOUNTER — Ambulatory Visit
Admission: RE | Admit: 2016-06-18 | Discharge: 2016-06-18 | Disposition: A | Discharge status: Home / Self Care | Payer: Commercial Managed Care - HMO | Source: Ambulatory Visit | Attending: Internal Medicine | Admitting: Internal Medicine

## 2016-06-18 DIAGNOSIS — Z1231 Encounter for screening mammogram for malignant neoplasm of breast: Secondary | ICD-10-CM

## 2016-10-21 ENCOUNTER — Ambulatory Visit (HOSPITAL_COMMUNITY)
Admission: RE | Admit: 2016-10-21 | Discharge: 2016-10-21 | Disposition: A | Discharge status: Home / Self Care | Payer: Commercial Managed Care - HMO | Source: Ambulatory Visit | Attending: Internal Medicine | Admitting: Internal Medicine

## 2016-12-23 DIAGNOSIS — M7989 Other specified soft tissue disorders: Secondary | ICD-10-CM | POA: Diagnosis not present

## 2016-12-23 DIAGNOSIS — M810 Age-related osteoporosis without current pathological fracture: Secondary | ICD-10-CM | POA: Diagnosis not present

## 2016-12-23 DIAGNOSIS — I10 Essential (primary) hypertension: Secondary | ICD-10-CM | POA: Diagnosis not present

## 2016-12-23 DIAGNOSIS — H4089 Other specified glaucoma: Secondary | ICD-10-CM | POA: Diagnosis not present

## 2016-12-23 DIAGNOSIS — R931 Abnormal findings on diagnostic imaging of heart and coronary circulation: Secondary | ICD-10-CM | POA: Diagnosis not present

## 2016-12-23 DIAGNOSIS — Z6824 Body mass index (BMI) 24.0-24.9, adult: Secondary | ICD-10-CM | POA: Diagnosis not present

## 2016-12-23 DIAGNOSIS — M4802 Spinal stenosis, cervical region: Secondary | ICD-10-CM | POA: Diagnosis not present

## 2016-12-23 DIAGNOSIS — I6990 Unspecified sequelae of unspecified cerebrovascular disease: Secondary | ICD-10-CM | POA: Diagnosis not present

## 2016-12-23 DIAGNOSIS — R69 Illness, unspecified: Secondary | ICD-10-CM | POA: Diagnosis not present

## 2017-02-03 ENCOUNTER — Encounter (HOSPITAL_COMMUNITY): Payer: Self-pay

## 2017-02-03 ENCOUNTER — Ambulatory Visit (HOSPITAL_COMMUNITY)
Admission: RE | Admit: 2017-02-03 | Discharge: 2017-02-03 | Disposition: A | Discharge status: Home / Self Care | Payer: Medicare HMO | Source: Ambulatory Visit | Attending: Internal Medicine | Admitting: Internal Medicine

## 2017-02-03 DIAGNOSIS — M810 Age-related osteoporosis without current pathological fracture: Secondary | ICD-10-CM | POA: Insufficient documentation

## 2017-02-03 MED ORDER — DENOSUMAB 60 MG/ML ~~LOC~~ SOLN
60.0000 mg | Freq: Once | SUBCUTANEOUS | Status: AC
  Administered 2017-02-03: 60 mg via SUBCUTANEOUS
  Filled 2017-02-03: qty 1

## 2017-02-03 NOTE — Discharge Instructions (Signed)
Prolia   Denosumab injection What is this medicine? DENOSUMAB (den oh sue mab) slows bone breakdown. Prolia is used to treat osteoporosis in women after menopause and in men. Delton See is used to prevent bone fractures and other bone problems caused by cancer bone metastases. Delton See is also used to treat giant cell tumor of the bone. COMMON BRAND NAME(S): Prolia, XGEVA What should I tell my health care provider before I take this medicine? They need to know if you have any of these conditions: -dental disease -eczema -infection or history of infections -kidney disease or on dialysis -low blood calcium or vitamin D -malabsorption syndrome -scheduled to have surgery or tooth extraction -taking medicine that contains denosumab -thyroid or parathyroid disease -an unusual reaction to denosumab, other medicines, foods, dyes, or preservatives -pregnant or trying to get pregnant -breast-feeding How should I use this medicine? This medicine is for injection under the skin. It is given by a health care professional in a hospital or clinic setting. If you are getting Prolia, a special MedGuide will be given to you by the pharmacist with each prescription and refill. Be sure to read this information carefully each time. For Prolia, talk to your pediatrician regarding the use of this medicine in children. Special care may be needed. For Delton See, talk to your pediatrician regarding the use of this medicine in children. While this drug may be prescribed for children as young as 13 years for selected conditions, precautions do apply. What if I miss a dose? It is important not to miss your dose. Call your doctor or health care professional if you are unable to keep an appointment. What may interact with this medicine? Do not take this medicine with any of the following medications: -other medicines containing denosumab This medicine may also interact with the following medications: -medicines that suppress  the immune system -medicines that treat cancer -steroid medicines like prednisone or cortisone What should I watch for while using this medicine? Visit your doctor or health care professional for regular checks on your progress. Your doctor or health care professional may order blood tests and other tests to see how you are doing. Call your doctor or health care professional if you get a cold or other infection while receiving this medicine. Do not treat yourself. This medicine may decrease your body's ability to fight infection. You should make sure you get enough calcium and vitamin D while you are taking this medicine, unless your doctor tells you not to. Discuss the foods you eat and the vitamins you take with your health care professional. See your dentist regularly. Brush and floss your teeth as directed. Before you have any dental work done, tell your dentist you are receiving this medicine. Do not become pregnant while taking this medicine or for 5 months after stopping it. Women should inform their doctor if they wish to become pregnant or think they might be pregnant. There is a potential for serious side effects to an unborn child. Talk to your health care professional or pharmacist for more information. What side effects may I notice from receiving this medicine? Side effects that you should report to your doctor or health care professional as soon as possible: -allergic reactions like skin rash, itching or hives, swelling of the face, lips, or tongue -breathing problems -chest pain -fast, irregular heartbeat -feeling faint or lightheaded, falls -fever, chills, or any other sign of infection -muscle spasms, tightening, or twitches -numbness or tingling -skin blisters or bumps, or is dry, peels,  or red -slow healing or unexplained pain in the mouth or jaw -unusual bleeding or bruising Side effects that usually do not require medical attention (report to your doctor or health care  professional if they continue or are bothersome): -muscle pain -stomach upset, gas Where should I keep my medicine? This medicine is only given in a clinic, doctor's office, or other health care setting and will not be stored at home.  2017 [employer] (2016-01-08 10:06:55)

## 2017-02-14 DIAGNOSIS — Z961 Presence of intraocular lens: Secondary | ICD-10-CM | POA: Diagnosis not present

## 2017-02-14 DIAGNOSIS — T8529XD Other mechanical complication of intraocular lens, subsequent encounter: Secondary | ICD-10-CM | POA: Diagnosis not present

## 2017-02-14 DIAGNOSIS — H4051X1 Glaucoma secondary to other eye disorders, right eye, mild stage: Secondary | ICD-10-CM | POA: Diagnosis not present

## 2017-02-14 DIAGNOSIS — H1851 Endothelial corneal dystrophy: Secondary | ICD-10-CM | POA: Diagnosis not present

## 2017-04-05 DIAGNOSIS — J029 Acute pharyngitis, unspecified: Secondary | ICD-10-CM | POA: Diagnosis not present

## 2017-04-05 DIAGNOSIS — Z6825 Body mass index (BMI) 25.0-25.9, adult: Secondary | ICD-10-CM | POA: Diagnosis not present

## 2017-04-29 DIAGNOSIS — H4051X1 Glaucoma secondary to other eye disorders, right eye, mild stage: Secondary | ICD-10-CM | POA: Diagnosis not present

## 2017-05-19 DIAGNOSIS — E784 Other hyperlipidemia: Secondary | ICD-10-CM | POA: Diagnosis not present

## 2017-05-19 DIAGNOSIS — I10 Essential (primary) hypertension: Secondary | ICD-10-CM | POA: Diagnosis not present

## 2017-05-19 DIAGNOSIS — R8299 Other abnormal findings in urine: Secondary | ICD-10-CM | POA: Diagnosis not present

## 2017-05-19 DIAGNOSIS — M810 Age-related osteoporosis without current pathological fracture: Secondary | ICD-10-CM | POA: Diagnosis not present

## 2017-05-19 DIAGNOSIS — N390 Urinary tract infection, site not specified: Secondary | ICD-10-CM | POA: Diagnosis not present

## 2017-05-23 ENCOUNTER — Other Ambulatory Visit: Payer: Self-pay | Admitting: Internal Medicine

## 2017-05-23 DIAGNOSIS — Z1231 Encounter for screening mammogram for malignant neoplasm of breast: Secondary | ICD-10-CM

## 2017-05-26 ENCOUNTER — Other Ambulatory Visit: Payer: Self-pay | Admitting: Internal Medicine

## 2017-05-26 DIAGNOSIS — I4510 Unspecified right bundle-branch block: Secondary | ICD-10-CM | POA: Diagnosis not present

## 2017-05-26 DIAGNOSIS — R69 Illness, unspecified: Secondary | ICD-10-CM | POA: Diagnosis not present

## 2017-05-26 DIAGNOSIS — M5136 Other intervertebral disc degeneration, lumbar region: Secondary | ICD-10-CM | POA: Diagnosis not present

## 2017-05-26 DIAGNOSIS — M1990 Unspecified osteoarthritis, unspecified site: Secondary | ICD-10-CM | POA: Diagnosis not present

## 2017-05-26 DIAGNOSIS — M810 Age-related osteoporosis without current pathological fracture: Secondary | ICD-10-CM | POA: Diagnosis not present

## 2017-05-26 DIAGNOSIS — E784 Other hyperlipidemia: Secondary | ICD-10-CM | POA: Diagnosis not present

## 2017-05-26 DIAGNOSIS — Z0000 Encounter for general adult medical examination without abnormal findings: Secondary | ICD-10-CM | POA: Diagnosis not present

## 2017-05-26 DIAGNOSIS — I6990 Unspecified sequelae of unspecified cerebrovascular disease: Secondary | ICD-10-CM | POA: Diagnosis not present

## 2017-05-26 DIAGNOSIS — R3121 Asymptomatic microscopic hematuria: Secondary | ICD-10-CM

## 2017-05-26 DIAGNOSIS — R931 Abnormal findings on diagnostic imaging of heart and coronary circulation: Secondary | ICD-10-CM | POA: Diagnosis not present

## 2017-05-26 DIAGNOSIS — H4089 Other specified glaucoma: Secondary | ICD-10-CM | POA: Diagnosis not present

## 2017-05-27 DIAGNOSIS — Z1212 Encounter for screening for malignant neoplasm of rectum: Secondary | ICD-10-CM | POA: Diagnosis not present

## 2017-05-30 DIAGNOSIS — H4051X1 Glaucoma secondary to other eye disorders, right eye, mild stage: Secondary | ICD-10-CM | POA: Diagnosis not present

## 2017-05-31 ENCOUNTER — Ambulatory Visit
Admission: RE | Admit: 2017-05-31 | Discharge: 2017-05-31 | Disposition: A | Discharge status: Home / Self Care | Payer: Medicare HMO | Source: Ambulatory Visit | Attending: Internal Medicine | Admitting: Internal Medicine

## 2017-05-31 DIAGNOSIS — R3129 Other microscopic hematuria: Secondary | ICD-10-CM | POA: Diagnosis not present

## 2017-05-31 DIAGNOSIS — R3121 Asymptomatic microscopic hematuria: Secondary | ICD-10-CM

## 2017-06-13 DIAGNOSIS — G8929 Other chronic pain: Secondary | ICD-10-CM | POA: Diagnosis not present

## 2017-06-13 DIAGNOSIS — M5442 Lumbago with sciatica, left side: Secondary | ICD-10-CM | POA: Diagnosis not present

## 2017-06-13 DIAGNOSIS — M5136 Other intervertebral disc degeneration, lumbar region: Secondary | ICD-10-CM | POA: Diagnosis not present

## 2017-06-20 ENCOUNTER — Ambulatory Visit
Admission: RE | Admit: 2017-06-20 | Discharge: 2017-06-20 | Disposition: A | Discharge status: Home / Self Care | Payer: Medicare HMO | Source: Ambulatory Visit | Attending: Internal Medicine | Admitting: Internal Medicine

## 2017-06-20 DIAGNOSIS — Z1231 Encounter for screening mammogram for malignant neoplasm of breast: Secondary | ICD-10-CM | POA: Diagnosis not present

## 2017-06-23 DIAGNOSIS — M810 Age-related osteoporosis without current pathological fracture: Secondary | ICD-10-CM | POA: Diagnosis not present

## 2017-06-23 DIAGNOSIS — R3121 Asymptomatic microscopic hematuria: Secondary | ICD-10-CM | POA: Diagnosis not present

## 2017-07-18 DIAGNOSIS — H4051X1 Glaucoma secondary to other eye disorders, right eye, mild stage: Secondary | ICD-10-CM | POA: Diagnosis not present

## 2017-07-27 DIAGNOSIS — M5442 Lumbago with sciatica, left side: Secondary | ICD-10-CM | POA: Diagnosis not present

## 2017-08-04 ENCOUNTER — Ambulatory Visit (HOSPITAL_COMMUNITY): Payer: Medicare HMO

## 2017-08-09 DIAGNOSIS — M5442 Lumbago with sciatica, left side: Secondary | ICD-10-CM | POA: Diagnosis not present

## 2017-08-09 DIAGNOSIS — M5136 Other intervertebral disc degeneration, lumbar region: Secondary | ICD-10-CM | POA: Diagnosis not present

## 2017-08-09 DIAGNOSIS — G8929 Other chronic pain: Secondary | ICD-10-CM | POA: Diagnosis not present

## 2017-09-09 ENCOUNTER — Ambulatory Visit (HOSPITAL_COMMUNITY)
Admission: RE | Admit: 2017-09-09 | Discharge: 2017-09-09 | Disposition: A | Discharge status: Home / Self Care | Payer: Medicare HMO | Source: Ambulatory Visit | Attending: Internal Medicine | Admitting: Internal Medicine

## 2017-09-09 DIAGNOSIS — M810 Age-related osteoporosis without current pathological fracture: Secondary | ICD-10-CM | POA: Diagnosis not present

## 2017-09-09 MED ORDER — DENOSUMAB 60 MG/ML ~~LOC~~ SOLN
60.0000 mg | Freq: Once | SUBCUTANEOUS | Status: AC
  Administered 2017-09-09: 60 mg via SUBCUTANEOUS
  Filled 2017-09-09: qty 1

## 2017-09-09 NOTE — Discharge Instructions (Signed)
Prolia is every 6 months.  Call the office for your next appointment, around March 09, 2017.     Denosumab injection What is this medicine? DENOSUMAB (den oh sue mab) slows bone breakdown. Prolia is used to treat osteoporosis in women after menopause and in men. Delton See is used to treat a high calcium level due to cancer and to prevent bone fractures and other bone problems caused by multiple myeloma or cancer bone metastases. Delton See is also used to treat giant cell tumor of the bone. This medicine may be used for other purposes; ask your health care provider or pharmacist if you have questions. COMMON BRAND NAME(S): Prolia, XGEVA What should I tell my health care provider before I take this medicine? They need to know if you have any of these conditions: -dental disease -having surgery or tooth extraction -infection -kidney disease -low levels of calcium or Vitamin D in the blood -malnutrition -on hemodialysis -skin conditions or sensitivity -thyroid or parathyroid disease -an unusual reaction to denosumab, other medicines, foods, dyes, or preservatives -pregnant or trying to get pregnant -breast-feeding How should I use this medicine? This medicine is for injection under the skin. It is given by a health care professional in a hospital or clinic setting. If you are getting Prolia, a special MedGuide will be given to you by the pharmacist with each prescription and refill. Be sure to read this information carefully each time. For Prolia, talk to your pediatrician regarding the use of this medicine in children. Special care may be needed. For Delton See, talk to your pediatrician regarding the use of this medicine in children. While this drug may be prescribed for children as young as 13 years for selected conditions, precautions do apply. Overdosage: If you think you have taken too much of this medicine contact a poison control center or emergency room at once. NOTE: This medicine is only for  you. Do not share this medicine with others. What if I miss a dose? It is important not to miss your dose. Call your doctor or health care professional if you are unable to keep an appointment. What may interact with this medicine? Do not take this medicine with any of the following medications: -other medicines containing denosumab This medicine may also interact with the following medications: -medicines that lower your chance of fighting infection -steroid medicines like prednisone or cortisone This list may not describe all possible interactions. Give your health care provider a list of all the medicines, herbs, non-prescription drugs, or dietary supplements you use. Also tell them if you smoke, drink alcohol, or use illegal drugs. Some items may interact with your medicine. What should I watch for while using this medicine? Visit your doctor or health care professional for regular checks on your progress. Your doctor or health care professional may order blood tests and other tests to see how you are doing. Call your doctor or health care professional for advice if you get a fever, chills or sore throat, or other symptoms of a cold or flu. Do not treat yourself. This drug may decrease your body's ability to fight infection. Try to avoid being around people who are sick. You should make sure you get enough calcium and vitamin D while you are taking this medicine, unless your doctor tells you not to. Discuss the foods you eat and the vitamins you take with your health care professional. See your dentist regularly. Brush and floss your teeth as directed. Before you have any dental work done, tell  your dentist you are receiving this medicine. Do not become pregnant while taking this medicine or for 5 months after stopping it. Talk with your doctor or health care professional about your birth control options while taking this medicine. Women should inform their doctor if they wish to become pregnant  or think they might be pregnant. There is a potential for serious side effects to an unborn child. Talk to your health care professional or pharmacist for more information. What side effects may I notice from receiving this medicine? Side effects that you should report to your doctor or health care professional as soon as possible: -allergic reactions like skin rash, itching or hives, swelling of the face, lips, or tongue -bone pain -breathing problems -dizziness -jaw pain, especially after dental work -redness, blistering, peeling of the skin -signs and symptoms of infection like fever or chills; cough; sore throat; pain or trouble passing urine -signs of low calcium like fast heartbeat, muscle cramps or muscle pain; pain, tingling, numbness in the hands or feet; seizures -unusual bleeding or bruising -unusually weak or tired Side effects that usually do not require medical attention (report to your doctor or health care professional if they continue or are bothersome): -constipation -diarrhea -headache -joint pain -loss of appetite -muscle pain -runny nose -tiredness -upset stomach This list may not describe all possible side effects. Call your doctor for medical advice about side effects. You may report side effects to FDA at 1-800-FDA-1088. Where should I keep my medicine? This medicine is only given in a clinic, doctor's office, or other health care setting and will not be stored at home. NOTE: This sheet is a summary. It may not cover all possible information. If you have questions about this medicine, talk to your doctor, pharmacist, or health care provider.  2018 [employer] (2016-12-28 19:17:21)

## 2017-09-09 NOTE — Progress Notes (Signed)
prolia 60mg  sq given.  Pt given d/c instructions for prolia.  Informed pt to call office in March 2019 for next appointment.  Pt voiced understanding.

## 2017-09-12 DIAGNOSIS — T8529XD Other mechanical complication of intraocular lens, subsequent encounter: Secondary | ICD-10-CM | POA: Diagnosis not present

## 2017-09-12 DIAGNOSIS — Z961 Presence of intraocular lens: Secondary | ICD-10-CM | POA: Diagnosis not present

## 2017-09-12 DIAGNOSIS — Z9842 Cataract extraction status, left eye: Secondary | ICD-10-CM | POA: Diagnosis not present

## 2017-09-12 DIAGNOSIS — H4051X1 Glaucoma secondary to other eye disorders, right eye, mild stage: Secondary | ICD-10-CM | POA: Diagnosis not present

## 2017-09-12 DIAGNOSIS — H4311 Vitreous hemorrhage, right eye: Secondary | ICD-10-CM | POA: Diagnosis not present

## 2017-09-12 DIAGNOSIS — H1851 Endothelial corneal dystrophy: Secondary | ICD-10-CM | POA: Diagnosis not present

## 2017-10-06 DIAGNOSIS — M170 Bilateral primary osteoarthritis of knee: Secondary | ICD-10-CM | POA: Diagnosis not present

## 2017-10-06 DIAGNOSIS — M25562 Pain in left knee: Secondary | ICD-10-CM | POA: Diagnosis not present

## 2017-10-06 DIAGNOSIS — M25561 Pain in right knee: Secondary | ICD-10-CM | POA: Diagnosis not present

## 2017-10-20 DIAGNOSIS — R69 Illness, unspecified: Secondary | ICD-10-CM | POA: Diagnosis not present

## 2017-11-02 IMAGING — US US RENAL
1 series · 14 of 25 positions shown · non-contrast
Comparison: 12/17/2015 CT

CLINICAL DATA: 82-year-old female with microscopic hematuria.
Initial encounter.

EXAM:
RENAL / URINARY TRACT ULTRASOUND COMPLETE

[Series 1: us renal · 0.18mm/px · 14 of 37 slices shown]
[im 1/37]
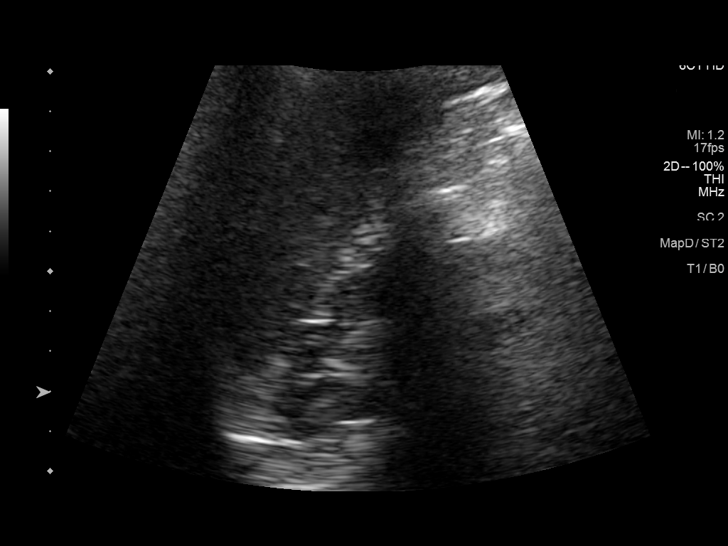
[im 4/37]
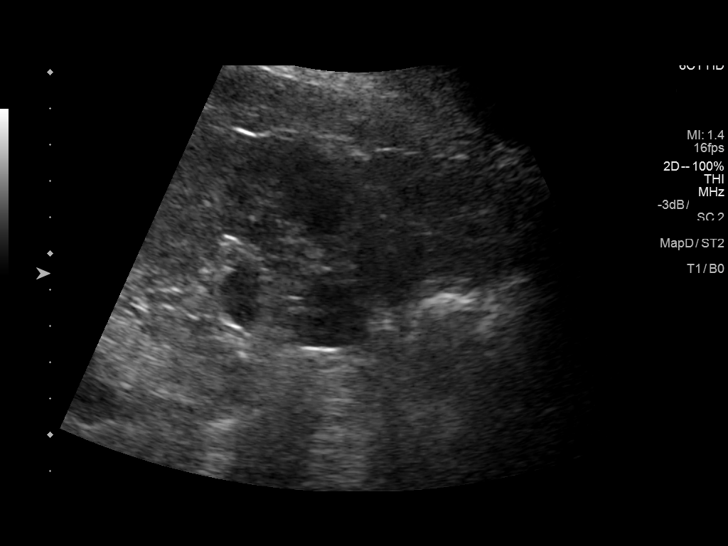
[im 7/37]
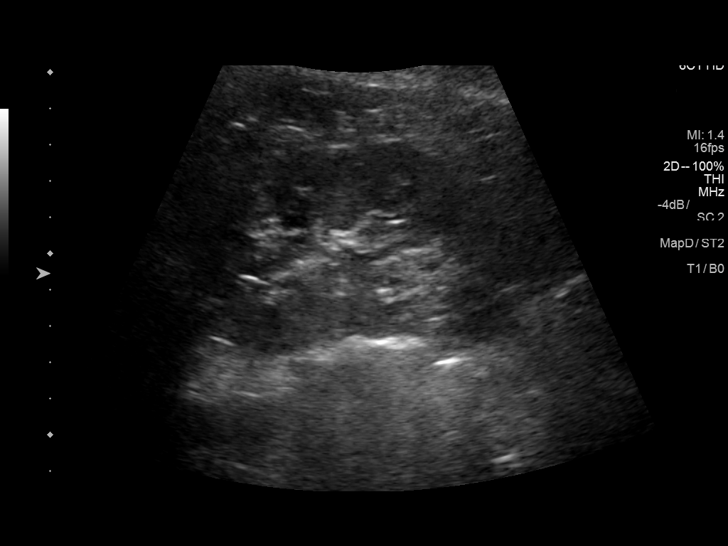
[im 10/37]
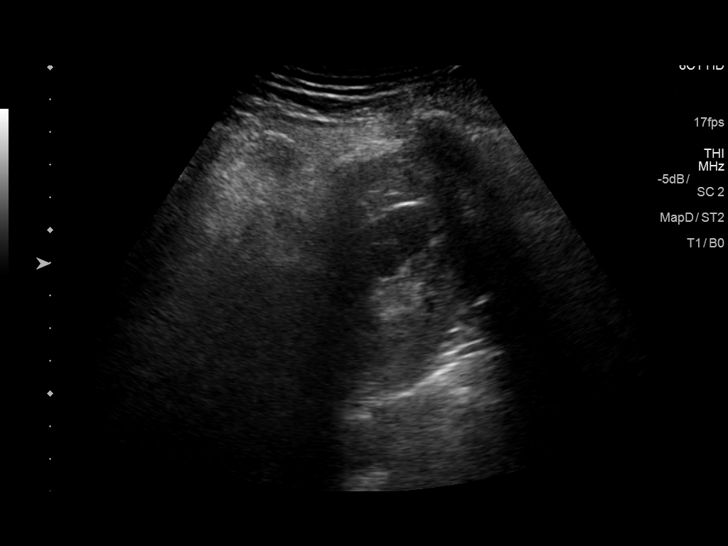
[im 13/37]
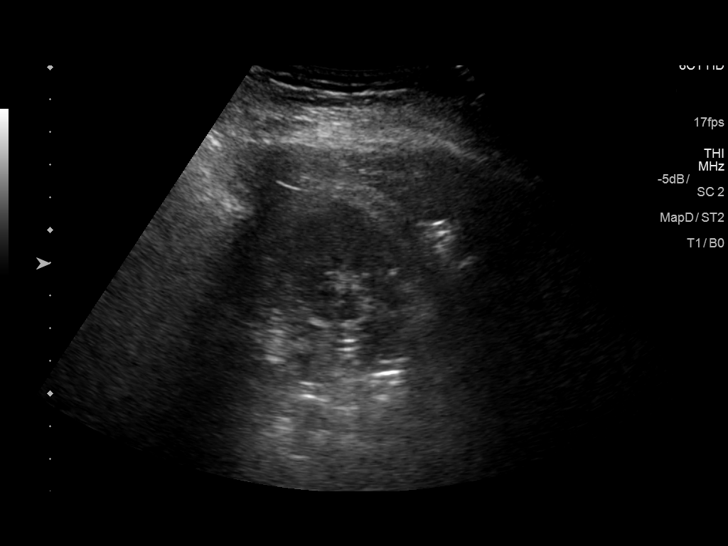
[im 14/37]
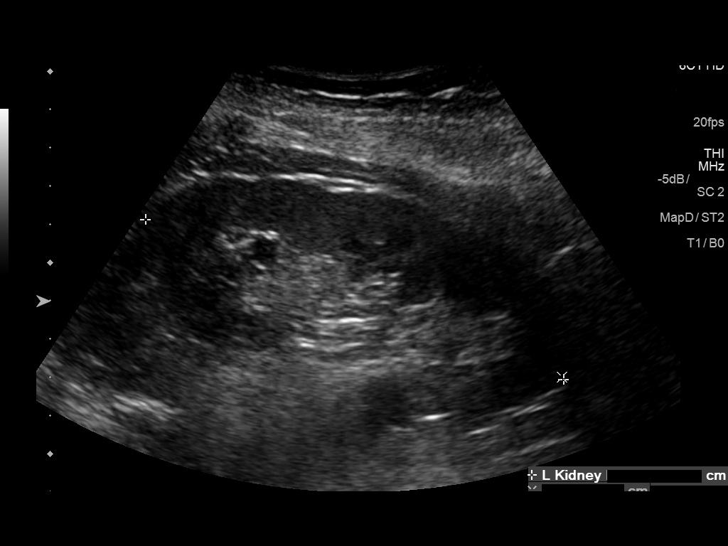
[im 17/37]
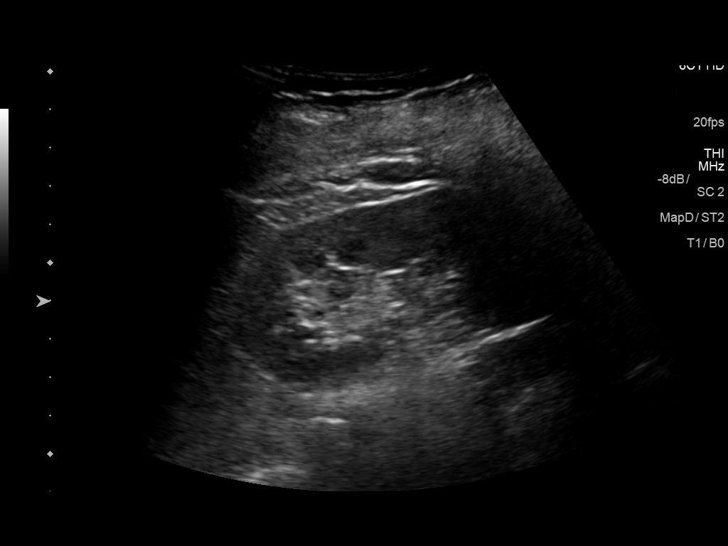
[im 20/37]
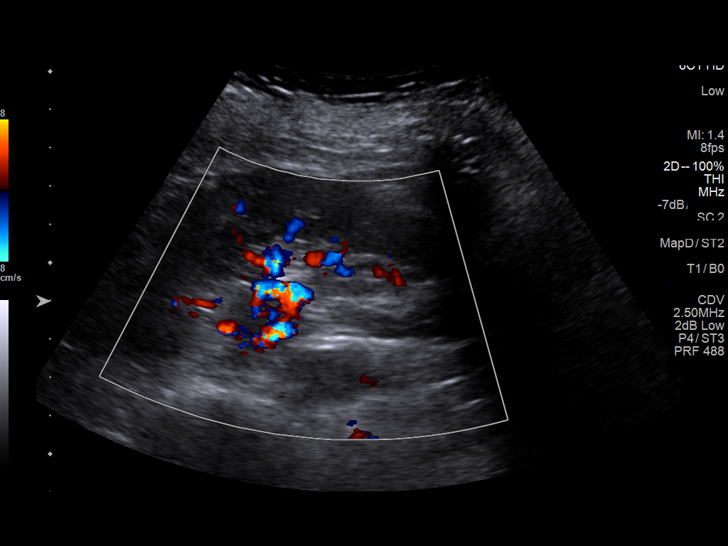
[im 23/37]
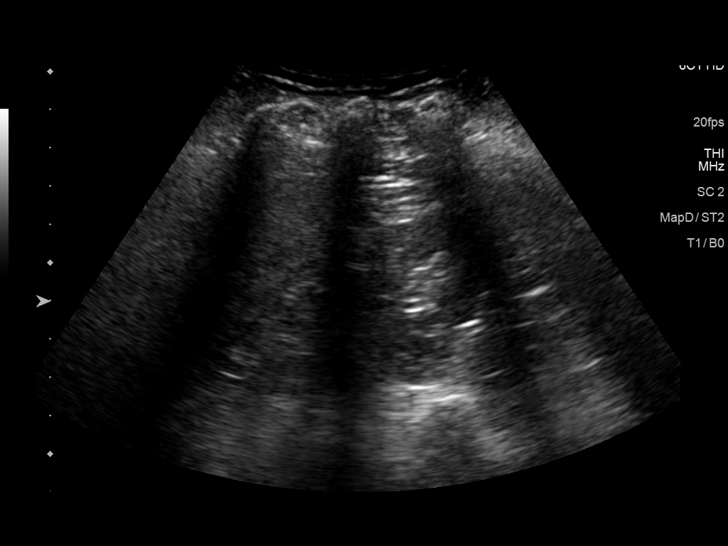
[im 25/37]
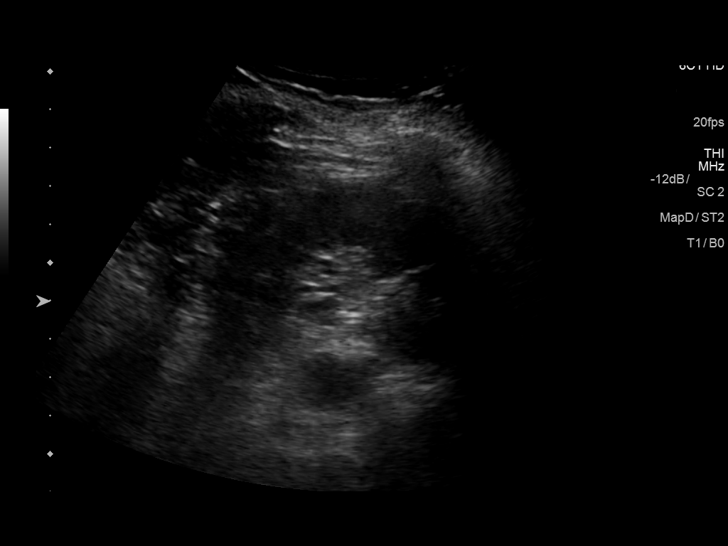
[im 28/37]
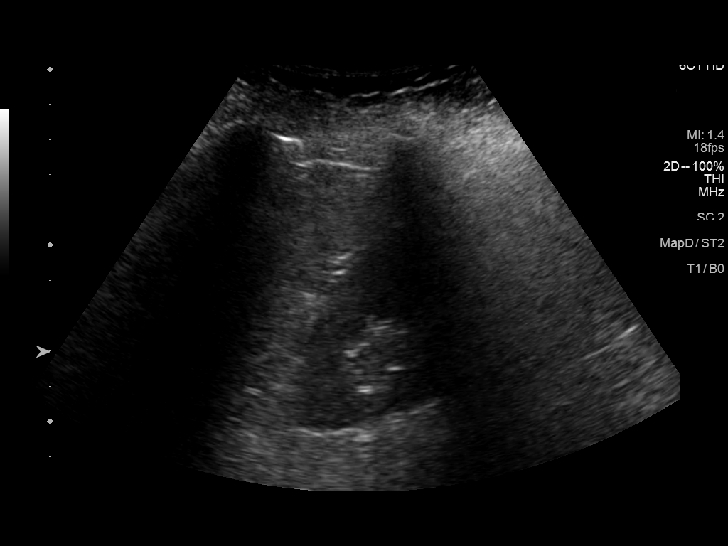
[im 31/37]
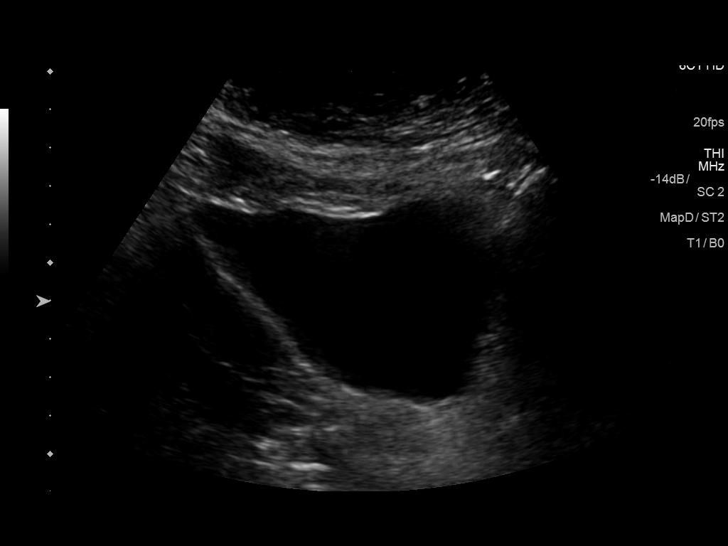
[im 34/37]
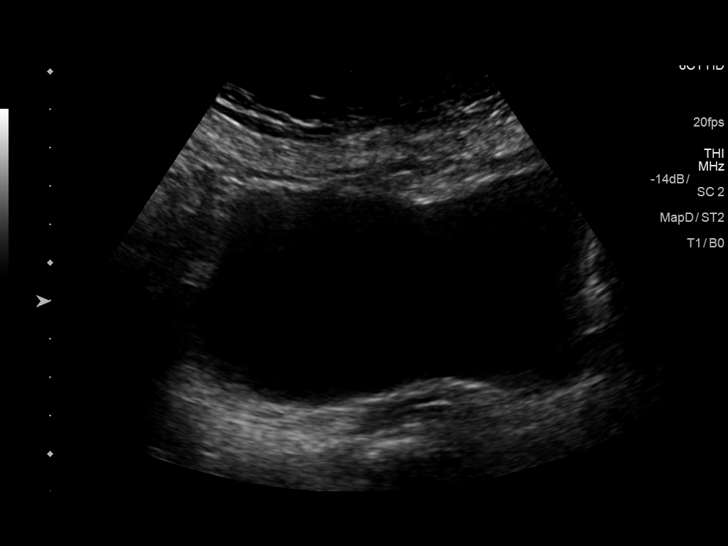
[im 37/37]
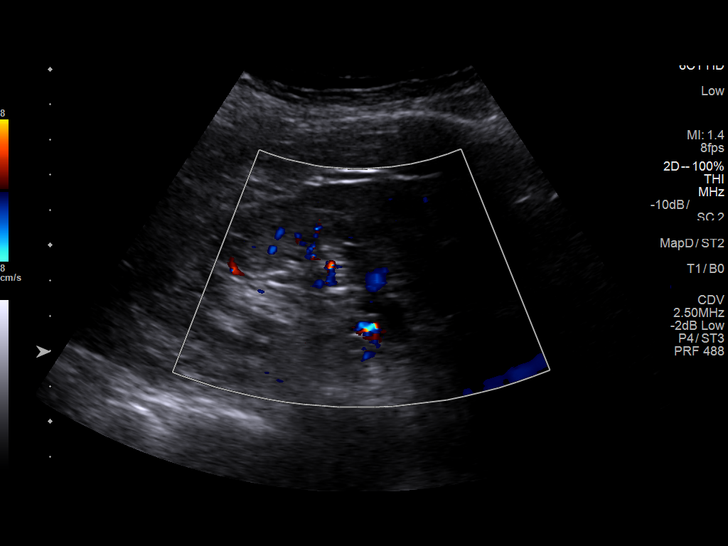

[14 of 25 positions shown; findings below may reference images not displayed]

FINDINGS: Right Kidney:

Length: 10.6 cm. Echogenicity within normal limits. No mass or
hydronephrosis visualized.

Left Kidney:

Length: 11.7 cm. No renal mass. Extra renal type pelvis as noted on
prior CT.

Bladder:

Appears normal for degree of bladder distention.
IMPRESSION: No renal mass or stone identified.

Extra renal pelvis without hydronephrosis.

## 2017-11-18 DIAGNOSIS — M25562 Pain in left knee: Secondary | ICD-10-CM | POA: Diagnosis not present

## 2017-11-18 DIAGNOSIS — M170 Bilateral primary osteoarthritis of knee: Secondary | ICD-10-CM | POA: Diagnosis not present

## 2017-11-18 DIAGNOSIS — M25561 Pain in right knee: Secondary | ICD-10-CM | POA: Diagnosis not present

## 2017-11-25 DIAGNOSIS — I10 Essential (primary) hypertension: Secondary | ICD-10-CM | POA: Diagnosis not present

## 2017-11-25 DIAGNOSIS — R69 Illness, unspecified: Secondary | ICD-10-CM | POA: Diagnosis not present

## 2017-11-25 DIAGNOSIS — H4089 Other specified glaucoma: Secondary | ICD-10-CM | POA: Diagnosis not present

## 2017-11-25 DIAGNOSIS — Z23 Encounter for immunization: Secondary | ICD-10-CM | POA: Diagnosis not present

## 2017-11-25 DIAGNOSIS — M810 Age-related osteoporosis without current pathological fracture: Secondary | ICD-10-CM | POA: Diagnosis not present

## 2017-11-25 DIAGNOSIS — R3121 Asymptomatic microscopic hematuria: Secondary | ICD-10-CM | POA: Diagnosis not present

## 2018-01-13 DIAGNOSIS — M25562 Pain in left knee: Secondary | ICD-10-CM | POA: Diagnosis not present

## 2018-01-13 DIAGNOSIS — M25561 Pain in right knee: Secondary | ICD-10-CM | POA: Diagnosis not present

## 2018-01-13 DIAGNOSIS — M170 Bilateral primary osteoarthritis of knee: Secondary | ICD-10-CM | POA: Diagnosis not present

## 2018-01-27 DIAGNOSIS — L812 Freckles: Secondary | ICD-10-CM | POA: Diagnosis not present

## 2018-01-27 DIAGNOSIS — L821 Other seborrheic keratosis: Secondary | ICD-10-CM | POA: Diagnosis not present

## 2018-01-27 DIAGNOSIS — D1801 Hemangioma of skin and subcutaneous tissue: Secondary | ICD-10-CM | POA: Diagnosis not present

## 2018-01-30 DIAGNOSIS — H4051X1 Glaucoma secondary to other eye disorders, right eye, mild stage: Secondary | ICD-10-CM | POA: Diagnosis not present

## 2018-03-09 DIAGNOSIS — M5136 Other intervertebral disc degeneration, lumbar region: Secondary | ICD-10-CM | POA: Diagnosis not present

## 2018-04-28 DIAGNOSIS — H4311 Vitreous hemorrhage, right eye: Secondary | ICD-10-CM | POA: Diagnosis not present

## 2018-04-28 DIAGNOSIS — H1851 Endothelial corneal dystrophy: Secondary | ICD-10-CM | POA: Diagnosis not present

## 2018-04-28 DIAGNOSIS — H4051X1 Glaucoma secondary to other eye disorders, right eye, mild stage: Secondary | ICD-10-CM | POA: Diagnosis not present

## 2018-04-28 DIAGNOSIS — T8529XD Other mechanical complication of intraocular lens, subsequent encounter: Secondary | ICD-10-CM | POA: Diagnosis not present

## 2018-04-28 DIAGNOSIS — Z961 Presence of intraocular lens: Secondary | ICD-10-CM | POA: Diagnosis not present

## 2018-04-28 DIAGNOSIS — Z9842 Cataract extraction status, left eye: Secondary | ICD-10-CM | POA: Diagnosis not present

## 2018-05-11 ENCOUNTER — Other Ambulatory Visit: Payer: Self-pay | Admitting: Internal Medicine

## 2018-05-11 DIAGNOSIS — Z1231 Encounter for screening mammogram for malignant neoplasm of breast: Secondary | ICD-10-CM

## 2018-05-18 DIAGNOSIS — Z0100 Encounter for examination of eyes and vision without abnormal findings: Secondary | ICD-10-CM | POA: Diagnosis not present

## 2018-05-25 DIAGNOSIS — I10 Essential (primary) hypertension: Secondary | ICD-10-CM | POA: Diagnosis not present

## 2018-05-25 DIAGNOSIS — R82998 Other abnormal findings in urine: Secondary | ICD-10-CM | POA: Diagnosis not present

## 2018-05-25 DIAGNOSIS — M810 Age-related osteoporosis without current pathological fracture: Secondary | ICD-10-CM | POA: Diagnosis not present

## 2018-05-25 DIAGNOSIS — E7849 Other hyperlipidemia: Secondary | ICD-10-CM | POA: Diagnosis not present

## 2018-06-01 DIAGNOSIS — H4089 Other specified glaucoma: Secondary | ICD-10-CM | POA: Diagnosis not present

## 2018-06-01 DIAGNOSIS — M1990 Unspecified osteoarthritis, unspecified site: Secondary | ICD-10-CM | POA: Diagnosis not present

## 2018-06-01 DIAGNOSIS — R3121 Asymptomatic microscopic hematuria: Secondary | ICD-10-CM | POA: Diagnosis not present

## 2018-06-01 DIAGNOSIS — M810 Age-related osteoporosis without current pathological fracture: Secondary | ICD-10-CM | POA: Diagnosis not present

## 2018-06-01 DIAGNOSIS — R931 Abnormal findings on diagnostic imaging of heart and coronary circulation: Secondary | ICD-10-CM | POA: Diagnosis not present

## 2018-06-01 DIAGNOSIS — Z0000 Encounter for general adult medical examination without abnormal findings: Secondary | ICD-10-CM | POA: Diagnosis not present

## 2018-06-01 DIAGNOSIS — M5136 Other intervertebral disc degeneration, lumbar region: Secondary | ICD-10-CM | POA: Diagnosis not present

## 2018-06-01 DIAGNOSIS — R69 Illness, unspecified: Secondary | ICD-10-CM | POA: Diagnosis not present

## 2018-06-01 DIAGNOSIS — I6990 Unspecified sequelae of unspecified cerebrovascular disease: Secondary | ICD-10-CM | POA: Diagnosis not present

## 2018-06-01 DIAGNOSIS — J3089 Other allergic rhinitis: Secondary | ICD-10-CM | POA: Diagnosis not present

## 2018-06-05 DIAGNOSIS — Z1212 Encounter for screening for malignant neoplasm of rectum: Secondary | ICD-10-CM | POA: Diagnosis not present

## 2018-06-09 DIAGNOSIS — M1711 Unilateral primary osteoarthritis, right knee: Secondary | ICD-10-CM | POA: Diagnosis not present

## 2018-06-09 DIAGNOSIS — M1712 Unilateral primary osteoarthritis, left knee: Secondary | ICD-10-CM | POA: Diagnosis not present

## 2018-06-13 DIAGNOSIS — M5136 Other intervertebral disc degeneration, lumbar region: Secondary | ICD-10-CM | POA: Diagnosis not present

## 2018-07-03 ENCOUNTER — Encounter (HOSPITAL_COMMUNITY): Payer: Self-pay

## 2018-07-03 ENCOUNTER — Ambulatory Visit (HOSPITAL_COMMUNITY)
Admission: RE | Admit: 2018-07-03 | Discharge: 2018-07-03 | Disposition: A | Discharge status: Home / Self Care | Payer: Medicare HMO | Source: Ambulatory Visit | Attending: Internal Medicine | Admitting: Internal Medicine

## 2018-07-03 DIAGNOSIS — M810 Age-related osteoporosis without current pathological fracture: Secondary | ICD-10-CM | POA: Insufficient documentation

## 2018-07-03 MED ORDER — DENOSUMAB 60 MG/ML ~~LOC~~ SOSY
60.0000 mg | PREFILLED_SYRINGE | Freq: Once | SUBCUTANEOUS | Status: AC
  Administered 2018-07-03: 60 mg via SUBCUTANEOUS
  Filled 2018-07-03: qty 1

## 2018-07-03 NOTE — Discharge Instructions (Signed)
Denosumab injection / Prolia What is this medicine? DENOSUMAB (den oh sue mab) slows bone breakdown. Prolia is used to treat osteoporosis in women after menopause and in men. Delton See is used to treat a high calcium level due to cancer and to prevent bone fractures and other bone problems caused by multiple myeloma or cancer bone metastases. Delton See is also used to treat giant cell tumor of the bone. This medicine may be used for other purposes; ask your health care provider or pharmacist if you have questions. COMMON BRAND NAME(S): Prolia, XGEVA What should I tell my health care provider before I take this medicine? They need to know if you have any of these conditions: -dental disease -having surgery or tooth extraction -infection -kidney disease -low levels of calcium or Vitamin D in the blood -malnutrition -on hemodialysis -skin conditions or sensitivity -thyroid or parathyroid disease -an unusual reaction to denosumab, other medicines, foods, dyes, or preservatives -pregnant or trying to get pregnant -breast-feeding How should I use this medicine? This medicine is for injection under the skin. It is given by a health care professional in a hospital or clinic setting. If you are getting Prolia, a special MedGuide will be given to you by the pharmacist with each prescription and refill. Be sure to read this information carefully each time. For Prolia, talk to your pediatrician regarding the use of this medicine in children. Special care may be needed. For Delton See, talk to your pediatrician regarding the use of this medicine in children. While this drug may be prescribed for children as young as 13 years for selected conditions, precautions do apply. Overdosage: If you think you have taken too much of this medicine contact a poison control center or emergency room at once. NOTE: This medicine is only for you. Do not share this medicine with others. What if I miss a dose? It is important not to  miss your dose. Call your doctor or health care professional if you are unable to keep an appointment. What may interact with this medicine? Do not take this medicine with any of the following medications: -other medicines containing denosumab This medicine may also interact with the following medications: -medicines that lower your chance of fighting infection -steroid medicines like prednisone or cortisone This list may not describe all possible interactions. Give your health care provider a list of all the medicines, herbs, non-prescription drugs, or dietary supplements you use. Also tell them if you smoke, drink alcohol, or use illegal drugs. Some items may interact with your medicine. What should I watch for while using this medicine? Visit your doctor or health care professional for regular checks on your progress. Your doctor or health care professional may order blood tests and other tests to see how you are doing. Call your doctor or health care professional for advice if you get a fever, chills or sore throat, or other symptoms of a cold or flu. Do not treat yourself. This drug may decrease your body's ability to fight infection. Try to avoid being around people who are sick. You should make sure you get enough calcium and vitamin D while you are taking this medicine, unless your doctor tells you not to. Discuss the foods you eat and the vitamins you take with your health care professional. See your dentist regularly. Brush and floss your teeth as directed. Before you have any dental work done, tell your dentist you are receiving this medicine. Do not become pregnant while taking this medicine or for 5 months  after stopping it. Talk with your doctor or health care professional about your birth control options while taking this medicine. Women should inform their doctor if they wish to become pregnant or think they might be pregnant. There is a potential for serious side effects to an unborn  child. Talk to your health care professional or pharmacist for more information. What side effects may I notice from receiving this medicine? Side effects that you should report to your doctor or health care professional as soon as possible: -allergic reactions like skin rash, itching or hives, swelling of the face, lips, or tongue -bone pain -breathing problems -dizziness -jaw pain, especially after dental work -redness, blistering, peeling of the skin -signs and symptoms of infection like fever or chills; cough; sore throat; pain or trouble passing urine -signs of low calcium like fast heartbeat, muscle cramps or muscle pain; pain, tingling, numbness in the hands or feet; seizures -unusual bleeding or bruising -unusually weak or tired Side effects that usually do not require medical attention (report to your doctor or health care professional if they continue or are bothersome): -constipation -diarrhea -headache -joint pain -loss of appetite -muscle pain -runny nose -tiredness -upset stomach This list may not describe all possible side effects. Call your doctor for medical advice about side effects. You may report side effects to FDA at 1-800-FDA-1088. Where should I keep my medicine? This medicine is only given in a clinic, doctor's office, or other health care setting and will not be stored at home. NOTE: This sheet is a summary. It may not cover all possible information. If you have questions about this medicine, talk to your doctor, pharmacist, or health care provider.  2018 [employer] (2016-12-28 19:17:21)

## 2018-07-12 ENCOUNTER — Ambulatory Visit: Payer: Self-pay

## 2018-07-18 ENCOUNTER — Ambulatory Visit: Payer: Self-pay

## 2018-07-20 DIAGNOSIS — R69 Illness, unspecified: Secondary | ICD-10-CM | POA: Diagnosis not present

## 2018-07-31 DIAGNOSIS — H4051X1 Glaucoma secondary to other eye disorders, right eye, mild stage: Secondary | ICD-10-CM | POA: Diagnosis not present

## 2018-08-04 ENCOUNTER — Ambulatory Visit: Payer: Self-pay

## 2018-08-14 DIAGNOSIS — M50322 Other cervical disc degeneration at C5-C6 level: Secondary | ICD-10-CM | POA: Diagnosis not present

## 2018-08-14 DIAGNOSIS — M9901 Segmental and somatic dysfunction of cervical region: Secondary | ICD-10-CM | POA: Diagnosis not present

## 2018-08-14 DIAGNOSIS — M5136 Other intervertebral disc degeneration, lumbar region: Secondary | ICD-10-CM | POA: Diagnosis not present

## 2018-08-14 DIAGNOSIS — M9903 Segmental and somatic dysfunction of lumbar region: Secondary | ICD-10-CM | POA: Diagnosis not present

## 2018-08-16 DIAGNOSIS — M5136 Other intervertebral disc degeneration, lumbar region: Secondary | ICD-10-CM | POA: Diagnosis not present

## 2018-08-16 DIAGNOSIS — M50322 Other cervical disc degeneration at C5-C6 level: Secondary | ICD-10-CM | POA: Diagnosis not present

## 2018-08-16 DIAGNOSIS — M9901 Segmental and somatic dysfunction of cervical region: Secondary | ICD-10-CM | POA: Diagnosis not present

## 2018-08-16 DIAGNOSIS — M9903 Segmental and somatic dysfunction of lumbar region: Secondary | ICD-10-CM | POA: Diagnosis not present

## 2018-08-22 DIAGNOSIS — M5136 Other intervertebral disc degeneration, lumbar region: Secondary | ICD-10-CM | POA: Diagnosis not present

## 2018-08-22 DIAGNOSIS — M9903 Segmental and somatic dysfunction of lumbar region: Secondary | ICD-10-CM | POA: Diagnosis not present

## 2018-08-22 DIAGNOSIS — M50322 Other cervical disc degeneration at C5-C6 level: Secondary | ICD-10-CM | POA: Diagnosis not present

## 2018-08-22 DIAGNOSIS — M9901 Segmental and somatic dysfunction of cervical region: Secondary | ICD-10-CM | POA: Diagnosis not present

## 2018-08-24 ENCOUNTER — Ambulatory Visit
Admission: RE | Admit: 2018-08-24 | Discharge: 2018-08-24 | Disposition: A | Discharge status: Home / Self Care | Payer: Medicare HMO | Source: Ambulatory Visit | Attending: Internal Medicine | Admitting: Internal Medicine

## 2018-08-24 ENCOUNTER — Encounter (INDEPENDENT_AMBULATORY_CARE_PROVIDER_SITE_OTHER): Payer: Self-pay

## 2018-08-24 DIAGNOSIS — M9903 Segmental and somatic dysfunction of lumbar region: Secondary | ICD-10-CM | POA: Diagnosis not present

## 2018-08-24 DIAGNOSIS — Z1231 Encounter for screening mammogram for malignant neoplasm of breast: Secondary | ICD-10-CM

## 2018-08-24 DIAGNOSIS — M50322 Other cervical disc degeneration at C5-C6 level: Secondary | ICD-10-CM | POA: Diagnosis not present

## 2018-08-24 DIAGNOSIS — M5136 Other intervertebral disc degeneration, lumbar region: Secondary | ICD-10-CM | POA: Diagnosis not present

## 2018-08-24 DIAGNOSIS — M9901 Segmental and somatic dysfunction of cervical region: Secondary | ICD-10-CM | POA: Diagnosis not present

## 2018-09-04 DIAGNOSIS — M5136 Other intervertebral disc degeneration, lumbar region: Secondary | ICD-10-CM | POA: Diagnosis not present

## 2018-09-04 DIAGNOSIS — M9903 Segmental and somatic dysfunction of lumbar region: Secondary | ICD-10-CM | POA: Diagnosis not present

## 2018-09-04 DIAGNOSIS — M9901 Segmental and somatic dysfunction of cervical region: Secondary | ICD-10-CM | POA: Diagnosis not present

## 2018-09-04 DIAGNOSIS — M50322 Other cervical disc degeneration at C5-C6 level: Secondary | ICD-10-CM | POA: Diagnosis not present

## 2018-09-07 DIAGNOSIS — M9901 Segmental and somatic dysfunction of cervical region: Secondary | ICD-10-CM | POA: Diagnosis not present

## 2018-09-07 DIAGNOSIS — M5136 Other intervertebral disc degeneration, lumbar region: Secondary | ICD-10-CM | POA: Diagnosis not present

## 2018-09-07 DIAGNOSIS — M9903 Segmental and somatic dysfunction of lumbar region: Secondary | ICD-10-CM | POA: Diagnosis not present

## 2018-09-07 DIAGNOSIS — M50322 Other cervical disc degeneration at C5-C6 level: Secondary | ICD-10-CM | POA: Diagnosis not present

## 2018-09-12 DIAGNOSIS — M5136 Other intervertebral disc degeneration, lumbar region: Secondary | ICD-10-CM | POA: Diagnosis not present

## 2018-09-12 DIAGNOSIS — M50322 Other cervical disc degeneration at C5-C6 level: Secondary | ICD-10-CM | POA: Diagnosis not present

## 2018-09-12 DIAGNOSIS — M9901 Segmental and somatic dysfunction of cervical region: Secondary | ICD-10-CM | POA: Diagnosis not present

## 2018-09-12 DIAGNOSIS — M9903 Segmental and somatic dysfunction of lumbar region: Secondary | ICD-10-CM | POA: Diagnosis not present

## 2018-09-18 DIAGNOSIS — M5136 Other intervertebral disc degeneration, lumbar region: Secondary | ICD-10-CM | POA: Diagnosis not present

## 2018-09-18 DIAGNOSIS — M50322 Other cervical disc degeneration at C5-C6 level: Secondary | ICD-10-CM | POA: Diagnosis not present

## 2018-09-18 DIAGNOSIS — M9903 Segmental and somatic dysfunction of lumbar region: Secondary | ICD-10-CM | POA: Diagnosis not present

## 2018-09-18 DIAGNOSIS — M9901 Segmental and somatic dysfunction of cervical region: Secondary | ICD-10-CM | POA: Diagnosis not present

## 2018-10-05 DIAGNOSIS — M50322 Other cervical disc degeneration at C5-C6 level: Secondary | ICD-10-CM | POA: Diagnosis not present

## 2018-10-05 DIAGNOSIS — M5136 Other intervertebral disc degeneration, lumbar region: Secondary | ICD-10-CM | POA: Diagnosis not present

## 2018-10-05 DIAGNOSIS — M9901 Segmental and somatic dysfunction of cervical region: Secondary | ICD-10-CM | POA: Diagnosis not present

## 2018-10-05 DIAGNOSIS — M9903 Segmental and somatic dysfunction of lumbar region: Secondary | ICD-10-CM | POA: Diagnosis not present

## 2018-10-10 DIAGNOSIS — M50322 Other cervical disc degeneration at C5-C6 level: Secondary | ICD-10-CM | POA: Diagnosis not present

## 2018-10-10 DIAGNOSIS — M9901 Segmental and somatic dysfunction of cervical region: Secondary | ICD-10-CM | POA: Diagnosis not present

## 2018-10-10 DIAGNOSIS — M5136 Other intervertebral disc degeneration, lumbar region: Secondary | ICD-10-CM | POA: Diagnosis not present

## 2018-10-10 DIAGNOSIS — M9903 Segmental and somatic dysfunction of lumbar region: Secondary | ICD-10-CM | POA: Diagnosis not present

## 2018-10-20 DIAGNOSIS — M1712 Unilateral primary osteoarthritis, left knee: Secondary | ICD-10-CM | POA: Diagnosis not present

## 2018-10-20 DIAGNOSIS — M25562 Pain in left knee: Secondary | ICD-10-CM | POA: Diagnosis not present

## 2018-10-20 DIAGNOSIS — M25561 Pain in right knee: Secondary | ICD-10-CM | POA: Diagnosis not present

## 2018-10-20 DIAGNOSIS — M1711 Unilateral primary osteoarthritis, right knee: Secondary | ICD-10-CM | POA: Diagnosis not present

## 2018-10-24 DIAGNOSIS — M9903 Segmental and somatic dysfunction of lumbar region: Secondary | ICD-10-CM | POA: Diagnosis not present

## 2018-10-24 DIAGNOSIS — M5136 Other intervertebral disc degeneration, lumbar region: Secondary | ICD-10-CM | POA: Diagnosis not present

## 2018-10-24 DIAGNOSIS — M5134 Other intervertebral disc degeneration, thoracic region: Secondary | ICD-10-CM | POA: Diagnosis not present

## 2018-10-24 DIAGNOSIS — M9902 Segmental and somatic dysfunction of thoracic region: Secondary | ICD-10-CM | POA: Diagnosis not present

## 2018-10-27 DIAGNOSIS — H4311 Vitreous hemorrhage, right eye: Secondary | ICD-10-CM | POA: Diagnosis not present

## 2018-10-27 DIAGNOSIS — Z9842 Cataract extraction status, left eye: Secondary | ICD-10-CM | POA: Diagnosis not present

## 2018-10-27 DIAGNOSIS — Z961 Presence of intraocular lens: Secondary | ICD-10-CM | POA: Diagnosis not present

## 2018-10-27 DIAGNOSIS — T8529XD Other mechanical complication of intraocular lens, subsequent encounter: Secondary | ICD-10-CM | POA: Diagnosis not present

## 2018-10-27 DIAGNOSIS — H4051X1 Glaucoma secondary to other eye disorders, right eye, mild stage: Secondary | ICD-10-CM | POA: Diagnosis not present

## 2018-10-27 DIAGNOSIS — H1851 Endothelial corneal dystrophy: Secondary | ICD-10-CM | POA: Diagnosis not present

## 2018-11-11 DIAGNOSIS — M5136 Other intervertebral disc degeneration, lumbar region: Secondary | ICD-10-CM | POA: Diagnosis not present

## 2018-11-23 DIAGNOSIS — M5136 Other intervertebral disc degeneration, lumbar region: Secondary | ICD-10-CM | POA: Diagnosis not present

## 2018-11-23 DIAGNOSIS — M5134 Other intervertebral disc degeneration, thoracic region: Secondary | ICD-10-CM | POA: Diagnosis not present

## 2018-11-23 DIAGNOSIS — M9903 Segmental and somatic dysfunction of lumbar region: Secondary | ICD-10-CM | POA: Diagnosis not present

## 2018-11-23 DIAGNOSIS — M9902 Segmental and somatic dysfunction of thoracic region: Secondary | ICD-10-CM | POA: Diagnosis not present

## 2018-11-30 DIAGNOSIS — M797 Fibromyalgia: Secondary | ICD-10-CM | POA: Diagnosis not present

## 2018-11-30 DIAGNOSIS — I10 Essential (primary) hypertension: Secondary | ICD-10-CM | POA: Diagnosis not present

## 2018-11-30 DIAGNOSIS — M5134 Other intervertebral disc degeneration, thoracic region: Secondary | ICD-10-CM | POA: Diagnosis not present

## 2018-11-30 DIAGNOSIS — M9902 Segmental and somatic dysfunction of thoracic region: Secondary | ICD-10-CM | POA: Diagnosis not present

## 2018-11-30 DIAGNOSIS — M9903 Segmental and somatic dysfunction of lumbar region: Secondary | ICD-10-CM | POA: Diagnosis not present

## 2018-11-30 DIAGNOSIS — M1990 Unspecified osteoarthritis, unspecified site: Secondary | ICD-10-CM | POA: Diagnosis not present

## 2018-11-30 DIAGNOSIS — Z6824 Body mass index (BMI) 24.0-24.9, adult: Secondary | ICD-10-CM | POA: Diagnosis not present

## 2018-11-30 DIAGNOSIS — R69 Illness, unspecified: Secondary | ICD-10-CM | POA: Diagnosis not present

## 2018-11-30 DIAGNOSIS — Z1389 Encounter for screening for other disorder: Secondary | ICD-10-CM | POA: Diagnosis not present

## 2018-11-30 DIAGNOSIS — E7849 Other hyperlipidemia: Secondary | ICD-10-CM | POA: Diagnosis not present

## 2018-11-30 DIAGNOSIS — Z23 Encounter for immunization: Secondary | ICD-10-CM | POA: Diagnosis not present

## 2018-11-30 DIAGNOSIS — I6990 Unspecified sequelae of unspecified cerebrovascular disease: Secondary | ICD-10-CM | POA: Diagnosis not present

## 2018-11-30 DIAGNOSIS — D509 Iron deficiency anemia, unspecified: Secondary | ICD-10-CM | POA: Diagnosis not present

## 2018-11-30 DIAGNOSIS — M5136 Other intervertebral disc degeneration, lumbar region: Secondary | ICD-10-CM | POA: Diagnosis not present

## 2018-12-06 DIAGNOSIS — M9902 Segmental and somatic dysfunction of thoracic region: Secondary | ICD-10-CM | POA: Diagnosis not present

## 2018-12-06 DIAGNOSIS — M5136 Other intervertebral disc degeneration, lumbar region: Secondary | ICD-10-CM | POA: Diagnosis not present

## 2018-12-06 DIAGNOSIS — M9903 Segmental and somatic dysfunction of lumbar region: Secondary | ICD-10-CM | POA: Diagnosis not present

## 2018-12-06 DIAGNOSIS — M5134 Other intervertebral disc degeneration, thoracic region: Secondary | ICD-10-CM | POA: Diagnosis not present

## 2018-12-20 DIAGNOSIS — E611 Iron deficiency: Secondary | ICD-10-CM

## 2018-12-20 HISTORY — DX: Iron deficiency: E61.1

## 2018-12-26 DIAGNOSIS — M9904 Segmental and somatic dysfunction of sacral region: Secondary | ICD-10-CM | POA: Diagnosis not present

## 2018-12-26 DIAGNOSIS — M9903 Segmental and somatic dysfunction of lumbar region: Secondary | ICD-10-CM | POA: Diagnosis not present

## 2018-12-26 DIAGNOSIS — M5136 Other intervertebral disc degeneration, lumbar region: Secondary | ICD-10-CM | POA: Diagnosis not present

## 2018-12-26 DIAGNOSIS — M9905 Segmental and somatic dysfunction of pelvic region: Secondary | ICD-10-CM | POA: Diagnosis not present

## 2019-01-02 DIAGNOSIS — M5136 Other intervertebral disc degeneration, lumbar region: Secondary | ICD-10-CM | POA: Diagnosis not present

## 2019-01-02 DIAGNOSIS — M9903 Segmental and somatic dysfunction of lumbar region: Secondary | ICD-10-CM | POA: Diagnosis not present

## 2019-01-02 DIAGNOSIS — M9905 Segmental and somatic dysfunction of pelvic region: Secondary | ICD-10-CM | POA: Diagnosis not present

## 2019-01-02 DIAGNOSIS — M9904 Segmental and somatic dysfunction of sacral region: Secondary | ICD-10-CM | POA: Diagnosis not present

## 2019-01-08 ENCOUNTER — Encounter (HOSPITAL_COMMUNITY): Payer: Self-pay

## 2019-01-08 ENCOUNTER — Ambulatory Visit (HOSPITAL_COMMUNITY)
Admission: RE | Admit: 2019-01-08 | Discharge: 2019-01-08 | Disposition: A | Discharge status: Home / Self Care | Payer: Medicare HMO | Source: Ambulatory Visit | Attending: Internal Medicine | Admitting: Internal Medicine

## 2019-01-08 DIAGNOSIS — M810 Age-related osteoporosis without current pathological fracture: Secondary | ICD-10-CM | POA: Insufficient documentation

## 2019-01-08 MED ORDER — DENOSUMAB 60 MG/ML ~~LOC~~ SOSY
60.0000 mg | PREFILLED_SYRINGE | Freq: Once | SUBCUTANEOUS | Status: AC
  Administered 2019-01-08: 60 mg via SUBCUTANEOUS
  Filled 2019-01-08: qty 1

## 2019-01-08 NOTE — Discharge Instructions (Signed)
Denosumab injection °What is this medicine? °DENOSUMAB (den oh sue mab) slows bone breakdown. Prolia is used to treat osteoporosis in women after menopause and in men, and in people who are taking corticosteroids for 6 months or more. Xgeva is used to treat a high calcium level due to cancer and to prevent bone fractures and other bone problems caused by multiple myeloma or cancer bone metastases. Xgeva is also used to treat giant cell tumor of the bone. °This medicine may be used for other purposes; ask your health care provider or pharmacist if you have questions. °COMMON BRAND NAME(S): Prolia, XGEVA °What should I tell my health care provider before I take this medicine? °They need to know if you have any of these conditions: °-dental disease °-having surgery or tooth extraction °-infection °-kidney disease °-low levels of calcium or Vitamin D in the blood °-malnutrition °-on hemodialysis °-skin conditions or sensitivity °-thyroid or parathyroid disease °-an unusual reaction to denosumab, other medicines, foods, dyes, or preservatives °-pregnant or trying to get pregnant °-breast-feeding °How should I use this medicine? °This medicine is for injection under the skin. It is given by a health care professional in a hospital or clinic setting. °A special MedGuide will be given to you before each treatment. Be sure to read this information carefully each time. °For Prolia, talk to your pediatrician regarding the use of this medicine in children. Special care may be needed. For Xgeva, talk to your pediatrician regarding the use of this medicine in children. While this drug may be prescribed for children as young as 13 years for selected conditions, precautions do apply. °Overdosage: If you think you have taken too much of this medicine contact a poison control center or emergency room at once. °NOTE: This medicine is only for you. Do not share this medicine with others. °What if I miss a dose? °It is important not to  miss your dose. Call your doctor or health care professional if you are unable to keep an appointment. °What may interact with this medicine? °Do not take this medicine with any of the following medications: °-other medicines containing denosumab °This medicine may also interact with the following medications: °-medicines that lower your chance of fighting infection °-steroid medicines like prednisone or cortisone °This list may not describe all possible interactions. Give your health care provider a list of all the medicines, herbs, non-prescription drugs, or dietary supplements you use. Also tell them if you smoke, drink alcohol, or use illegal drugs. Some items may interact with your medicine. °What should I watch for while using this medicine? °Visit your doctor or health care professional for regular checks on your progress. Your doctor or health care professional may order blood tests and other tests to see how you are doing. °Call your doctor or health care professional for advice if you get a fever, chills or sore throat, or other symptoms of a cold or flu. Do not treat yourself. This drug may decrease your body's ability to fight infection. Try to avoid being around people who are sick. °You should make sure you get enough calcium and vitamin D while you are taking this medicine, unless your doctor tells you not to. Discuss the foods you eat and the vitamins you take with your health care professional. °See your dentist regularly. Brush and floss your teeth as directed. Before you have any dental work done, tell your dentist you are receiving this medicine. °Do not become pregnant while taking this medicine or for 5 months   after stopping it. Talk with your doctor or health care professional about your birth control options while taking this medicine. Women should inform their doctor if they wish to become pregnant or think they might be pregnant. There is a potential for serious side effects to an unborn  child. Talk to your health care professional or pharmacist for more information. °What side effects may I notice from receiving this medicine? °Side effects that you should report to your doctor or health care professional as soon as possible: °-allergic reactions like skin rash, itching or hives, swelling of the face, lips, or tongue °-bone pain °-breathing problems °-dizziness °-jaw pain, especially after dental work °-redness, blistering, peeling of the skin °-signs and symptoms of infection like fever or chills; cough; sore throat; pain or trouble passing urine °-signs of low calcium like fast heartbeat, muscle cramps or muscle pain; pain, tingling, numbness in the hands or feet; seizures °-unusual bleeding or bruising °-unusually weak or tired °Side effects that usually do not require medical attention (report to your doctor or health care professional if they continue or are bothersome): °-constipation °-diarrhea °-headache °-joint pain °-loss of appetite °-muscle pain °-runny nose °-tiredness °-upset stomach °This list may not describe all possible side effects. Call your doctor for medical advice about side effects. You may report side effects to FDA at 1-800-FDA-1088. °Where should I keep my medicine? °This medicine is only given in a clinic, doctor's office, or other health care setting and will not be stored at home. °NOTE: This sheet is a summary. It may not cover all possible information. If you have questions about this medicine, talk to your doctor, pharmacist, or health care provider. °© 2019 [employer] (2018-04-14 16:10:44) ° °

## 2019-01-19 DIAGNOSIS — M25561 Pain in right knee: Secondary | ICD-10-CM | POA: Diagnosis not present

## 2019-01-19 DIAGNOSIS — M1712 Unilateral primary osteoarthritis, left knee: Secondary | ICD-10-CM | POA: Diagnosis not present

## 2019-01-19 DIAGNOSIS — M25562 Pain in left knee: Secondary | ICD-10-CM | POA: Diagnosis not present

## 2019-02-13 ENCOUNTER — Ambulatory Visit: Payer: Medicare HMO | Attending: Orthopedic Surgery | Admitting: Physical Therapy

## 2019-02-13 ENCOUNTER — Other Ambulatory Visit: Payer: Self-pay

## 2019-02-13 DIAGNOSIS — G8929 Other chronic pain: Secondary | ICD-10-CM

## 2019-02-13 DIAGNOSIS — M25562 Pain in left knee: Secondary | ICD-10-CM | POA: Diagnosis not present

## 2019-02-13 NOTE — Therapy (Signed)
[address] [place], [place], [address] Phone: [phone]   Fax:  [phone]  Physical Therapy Evaluation  Patient Details  Name: Terri Shah MRN: [id number] Date of Birth: [birth year] Referring Provider (PT): [name] [facility]   Encounter Date: 02/13/2019  PT End of Session - 02/13/19 0853    Visit Number  1    Number of Visits  1    Date for PT Re-Evaluation  02/13/19    PT Start Time  0853    PT Stop Time  0928    PT Time Calculation (min)  35 min    Activity Tolerance  Patient tolerated treatment well    Behavior During Therapy  [facility] for tasks assessed/performed       Past Medical History:  Diagnosis Date  . Anemia   . Arthritis   . Barrett esophagus   . Barrett's esophagus 11/02/2007   Qualifier: Diagnosis of  By: Mat Carne    . Cataract   . Diverticulosis   . ECTOPIC PREGNANCY 02/08/2008   Qualifier: Diagnosis of  By: Mat Carne    . Gastritis   . GASTRITIS 11/02/2007   Qualifier: Diagnosis of  By: Mat Carne    . Hiatal hernia   . History of GI diverticular bleed   . Hyperlipidemia   . Hypertension   . Iron deficiency anemia   . Palpitations 04/02/2010   Qualifier: Diagnosis of  By: Angelena Form, MD, Harrell Gave    . Presbyesophagus     Past Surgical History:  Procedure Laterality Date  . CATARACT EXTRACTION, BILATERAL    . CESAREAN SECTION  1950-1960s   x 3  . COLONOSCOPY N/A 12/19/2015   Procedure: COLONOSCOPY;  Surgeon: Manus Gunning, MD;  Location: [facility];  Service: Gastroenterology;  Laterality: N/A;  . FACIAL COSMETIC SURGERY    . LAPAROSCOPIC CHOLECYSTECTOMY SINGLE SITE WITH INTRAOPERATIVE CHOLANGIOGRAM N/A 10/27/2014   Procedure: LAPAROSCOPIC CHOLECYSTECTOMY SINGLE SITE WITH INTRAOPERATIVE CHOLANGIOGRAM;  Surgeon: Michael Boston, MD;  Location: [facility];  Service: General;  Laterality: N/A;  . TUBAL LIGATION      There were no vitals filed  for this visit.   Subjective Assessment - 02/13/19 0857    Subjective  Patient is scheduled for a left TKR on 03/20/19. She is here to get exercises to complete pre and post surgery.    Pertinent History  arthritis, OP, HTN,     Patient Stated Goals  to learn exercises    Currently in Pain?  Yes    Pain Score  4     Pain Location  Knee    Pain Orientation  Left    Pain Descriptors / Indicators  Aching    Pain Type  Chronic pain    Pain Onset  More than a month ago    Pain Frequency  Constant    Aggravating Factors   activity    Pain Relieving Factors  rest, meds    Effect of Pain on Daily Activities  limited         [facility] PT Assessment - 02/13/19 0001      Assessment   Medical Diagnosis  prmary OA left knee    Referring Provider (PT)  [name] [facility]    Onset Date/Surgical Date  01/20/18      Precautions   Precautions  None      Restrictions   Weight Bearing Restrictions  No  Balance Screen   Has the patient fallen in the past 6 months  Yes    How many times?  1   slipped getting into bed   Has the patient had a decrease in activity level because of a fear of falling?   No    Is the patient reluctant to leave their home because of a fear of falling?   No      Home Environment   Living Environment  Private residence    Living Arrangements  Alone    Type of [place] Access  Stairs to enter    Entrance Stairs-Number of Steps  1    Entrance Stairs-Rails  None    Home Layout  Two level;Able to live on main level with bedroom/bathroom    Home Equipment  None      Prior Function   Level of Independence  Independent    Vocation  Retired      ROM / Strength   AROM / PROM / Strength  AROM;PROM;Strength      AROM   AROM Assessment Site  Knee    Right/Left Knee  Left    Left Knee Extension  0    Left Knee Flexion  118      PROM   PROM Assessment Site  Knee    Right/Left Knee  Left    Left Knee Extension  0    Left Knee Flexion  125       Strength   Overall Strength Comments  left hip flex 4/5, knee ext/flex  4+/5 with pain      Flexibility   Soft Tissue Assessment /Muscle Length  --   HS WNL     Palpation   Palpation comment  medial knee                Objective measurements completed on examination: See above findings.              PT Education - 02/13/19 0933    Education Details  HEP    Person(s) Educated  Patient    Methods  Explanation;Demonstration;Verbal cues;Handout    Comprehension  Verbalized understanding;Returned demonstration       PT Short Term Goals - 02/13/19 0932      PT SHORT TERM GOAL #1   Title  Ind with intial HEP    Time  1    Period  Days    Status  Achieved                Plan - 02/13/19 0929    Clinical Impression Statement  Patient presents for one time visit to learn pre and post TKR exercises. She has mild ROM deficits in the left knee and some weakness as well. She was able to complete all TE independently after instruction.    History and Personal Factors relevant to plan of care:  arthritis, OP, HTN,     Clinical Presentation  Stable    Clinical Decision Making  Low    Rehab Potential  Excellent    PT Frequency  One time visit    PT Treatment/Interventions  Therapeutic exercise    PT Next Visit Plan  one time visit    PT Home Exercise Plan  TKR intial exercises [email or web address]    Consulted and Agree with Plan of Care  Patient       Patient will benefit from skilled therapeutic intervention in order to improve the following  deficits and impairments:  Pain, Decreased strength, Decreased range of motion  Visit Diagnosis: Chronic pain of left knee - Plan: PT plan of care cert/re-cert     Problem List Patient Active Problem List   Diagnosis Date Noted  . Colon polyp   . Lower GI bleed 12/18/2015  . Diverticulosis of colon with hemorrhage   . Nausea with vomiting 12/02/2014  . Ileus, postoperative ([place]) 11/04/2014  . Hyponatremia 11/04/2014   . Abdominal fluid collection   . Chest pain 10/26/2014  . Acute calculous cholecystitis 10/26/2014  . Barrett esophagus   . History of GI diverticular bleed   . Hypertension   . Dyspnea 01/18/2012  . Aortic insufficiency 01/18/2012  . ANEMIA, IRON DEFICIENCY 06/04/2010  . HYPERCHOLESTEROLEMIA 02/08/2008  . GERD 02/08/2008  . ARTHRITIS 02/08/2008  . HIATAL HERNIA 11/02/2007  . DIVERTICULOSIS OF COLON 04/07/2004    Madelyn Flavors PT 02/13/2019, 9:42 AM  [address] [place], [place], [address] Phone: [phone]   Fax:  [phone]  Name: DESHUNDA THACKSTON MRN: [id number] Date of Birth: 16-Jul-1934   PHYSICAL THERAPY DISCHARGE SUMMARY  Visits from Start of Care: 1  Current functional level related to goals / functional outcomes: See above   Remaining deficits: none   Education / Equipment: HEP Plan: Patient agrees to discharge.  Patient goals were met. Patient is being discharged due to meeting the stated rehab goals.  ?????    Madelyn Flavors, PT 02/13/19 9:43 AM; [address] [place], [place], [address] Phone: [phone]   Fax:  [phone]

## 2019-02-13 NOTE — Patient Instructions (Signed)
Access Code: [email or web address]  URL: [email or web address]  Date: 02/13/2019  Prepared by: Madelyn Flavors   Exercises  Supine Ankle Pumps - 15 reps - 3 sets - 1x daily - 7x weekly  Supine Quadricep Sets - 15 reps - 3 sets - 5 second hold - 1x daily - 7x weekly  Supine Hip Abduction - 15 reps - 3 sets - 1x daily - 7x weekly  Supine Active Straight Leg Raise - 15 reps - 3 sets - 1x daily - 7x weekly  Supine Short Arc Quad - 15 reps - 3 sets - 1x daily - 7x weekly  Supine Heel Slides - 15 reps - 3 sets - 1x daily - 7x weekly  Seated Long Arc Quad - 15 reps - 3 sets - 1x daily - 7x weekly  Seated Knee Flexion Stretch - 15 reps - 3 sets - 1x daily - 7x weekly  Patient Education  Total Knee Replacement Handout

## 2019-02-16 ENCOUNTER — Encounter: Payer: Self-pay | Admitting: Gastroenterology

## 2019-02-22 ENCOUNTER — Encounter: Payer: Self-pay | Admitting: Cardiovascular Disease

## 2019-02-22 ENCOUNTER — Ambulatory Visit: Payer: Medicare HMO | Admitting: Cardiovascular Disease

## 2019-02-22 DIAGNOSIS — E7800 Pure hypercholesterolemia, unspecified: Secondary | ICD-10-CM | POA: Diagnosis not present

## 2019-02-22 DIAGNOSIS — Z01818 Encounter for other preprocedural examination: Secondary | ICD-10-CM | POA: Diagnosis not present

## 2019-02-22 DIAGNOSIS — I10 Essential (primary) hypertension: Secondary | ICD-10-CM | POA: Diagnosis not present

## 2019-02-22 NOTE — Assessment & Plan Note (Signed)
History of hyperlipidemia on statin therapy with lipid profile performed 05/25/2018 revealing total cholesterol 157, LDL 77 and HDL 57.

## 2019-02-22 NOTE — Progress Notes (Signed)
02/22/2019 DARLYS BUIS   [birth year]  [id number]  Primary Physician Shon Baton, MD Primary Cardiologist: Lorretta Harp MD FACP, [place], [place], [place]  HPI:  Terri Shah is a 85 y.o. mildly overweight widowed Caucasian female mother of 3 children, grandmother and 3 great grandchildren referred by Dr. Virgina Jock for preoperative cardiovascular clearance before elective left total knee replacement by Dr. Alvan Dame.  She did see Dr. Angelena Form remotely back in 2013.  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  She is never had a heart attack but has had a TIA in the past.  She is otherwise asymptomatic denying chest pain or shortness of breath.  She does have treated hypertension and hyperlipidemia.  She apparently had a negative Myoview and 2D echo back in 2013 when she saw Dr. Angelena Form.   Current Meds  Medication Sig  . acetaminophen (TYLENOL) 500 MG tablet Take 500 mg by mouth every 6 (six) hours as needed. Patient used this medication for her pain.  Marland Kitchen amitriptyline (ELAVIL) 25 MG tablet Take 25 mg by mouth at bedtime as needed for sleep.   Marland Kitchen atorvastatin (LIPITOR) 20 MG tablet Take 20 mg by mouth daily.    . benazepril (LOTENSIN) 40 MG tablet Take 40 mg by mouth daily.  . calcium carbonate (TUMS EX) 750 MG chewable tablet Chew 1 tablet by mouth daily as needed for heartburn.  . Capsaicin-Menthol (SALONPAS GEL EX) Apply 1 application topically daily as needed (pain).  . cholecalciferol (VITAMIN D) 1000 UNITS tablet Take 1,000 Units by mouth daily.    Marland Kitchen denosumab (PROLIA) 60 MG/ML SOLN injection Inject 60 mg into the skin every 6 (six) months. Administer in upper arm, thigh, or abdomen  . dorzolamide-timolol (COSOPT) 22.3-6.8 MG/ML ophthalmic solution Place 1 drop into the right eye 2 (two) times daily.   . hydrochlorothiazide (HYDRODIURIL) 25 MG tablet Take 25 mg by mouth every other day.   . latanoprost (XALATAN) 0.005 % ophthalmic solution Place 1 drop into the right eye daily.    . Liniments (SALONPAS) PADS Apply 1-4 each topically daily as needed (pain).  Marland Kitchen omeprazole (PRILOSEC) 20 MG capsule Take 20 mg by mouth daily.   . polyvinyl alcohol (LIQUIFILM TEARS) 1.4 % ophthalmic solution Place 1 drop into both eyes daily as needed for dry eyes.  . prednisoLONE acetate (PRED FORTE) 1 % ophthalmic suspension prednisolone acetate 1 % eye drops,suspension  . Psyllium (METAMUCIL PO) Take 1 each by mouth at bedtime.  . Saline 2.65 % SOLN Place 1 each into the nose daily as needed (congestion).  . traMADol (ULTRAM) 50 MG tablet Take 50 mg by mouth every 6 (six) hours as needed for moderate pain.   . [DISCONTINUED] prednisoLONE 5 MG TABS tablet Take 5 mg by mouth daily. Pt not sure on exact dosage.  But takes once a day.  Pt will double check dosage for next visit.     Allergies  Allergen Reactions  . Codeine Other (See Comments)    "Perception off"     Social History   Socioeconomic History  . Marital status: Widowed    Spouse name: Not on file  . Number of children: 3  . Years of education: Not on file  . Highest education level: Not on file  Occupational History  . Not on file  Social Needs  . Financial resource strain: Not on file  . Food insecurity:    Worry: Not on file  Inability: Not on file  . Transportation needs:    Medical: Not on file    Non-medical: Not on file  Tobacco Use  . Smoking status: Never Smoker  . Smokeless tobacco: Never Used  Substance and Sexual Activity  . Alcohol use: Yes    Comment: occasional  . Drug use: No  . Sexual activity: Not on file  Lifestyle  . Physical activity:    Days per week: Not on file    Minutes per session: Not on file  . Stress: Not on file  Relationships  . Social connections:    Talks on phone: Not on file    Gets together: Not on file    Attends religious service: Not on file    Active member of club or organization: Not on file    Attends meetings of clubs or organizations: Not on file     Relationship status: Not on file  . Intimate partner violence:    Fear of current or ex partner: Not on file    Emotionally abused: Not on file    Physically abused: Not on file    Forced sexual activity: Not on file  Other Topics Concern  . Not on file  Social History Narrative  . Not on file     Review of Systems: General: negative for chills, fever, night sweats or weight changes.  Cardiovascular: negative for chest pain, dyspnea on exertion, edema, orthopnea, palpitations, paroxysmal nocturnal dyspnea or shortness of breath Dermatological: negative for rash Respiratory: negative for cough or wheezing Urologic: negative for hematuria Abdominal: negative for nausea, vomiting, diarrhea, bright red blood per rectum, melena, or hematemesis Neurologic: negative for visual changes, syncope, or dizziness All other systems reviewed and are otherwise negative except as noted above.    Blood pressure 110/64, pulse 62, height 5\' 1"  (1.549 m), weight 130 lb (59 kg).  General appearance: alert and no distress Neck: no adenopathy, no carotid bruit, no JVD, supple, symmetrical, trachea midline and thyroid not enlarged, symmetric, no tenderness/mass/nodules Lungs: clear to auscultation bilaterally Heart: regular rate and rhythm, S1, S2 normal, no murmur, click, rub or gallop Extremities: extremities normal, atraumatic, no cyanosis or edema Pulses: 2+ and symmetric Skin: Skin color, texture, turgor normal. No rashes or lesions Neurologic: Alert and oriented X 3, normal strength and tone. Normal symmetric reflexes. Normal coordination and gait  EKG sinus rhythm at 62 with right bundle branch block and inferior Q waves extending onto the lateral leads.  This is unchanged from prior EKGs.  I personally reviewed this EKG.  ASSESSMENT AND PLAN:   Preoperative clearance Ms. Ensz was referred for preoperative clearance before elective left total knee replacement by Dr. Alvan Dame.  She had a negative  Myoview approximately 7 years ago.  Her cardiac risk factors include hypertension and hyperlipidemia.  She is asymptomatic.  I am clearing her low risk without the need for functional study.  HYPERCHOLESTEROLEMIA History of hyperlipidemia on statin therapy with lipid profile performed 05/25/2018 revealing total cholesterol 157, LDL 77 and HDL 57.  Essential hypertension History of essential hypertension with blood pressure measured today at 110/64.  She is on hydrochlorothiazide.      Lorretta Harp MD FACP,FACC,FAHA, [facility] 02/22/2019 2:59 PM

## 2019-02-22 NOTE — Patient Instructions (Signed)
Medication Instructions:  Your physician recommends that you continue on your current medications as directed. Please refer to the Current Medication list given to you today.  If you need a refill on your cardiac medications before your next appointment, please call your pharmacy.   Lab work: NONE If you have labs (blood work) drawn today and your tests are completely normal, you will receive your results only by: Marland Kitchen MyChart Message (if you have MyChart) OR . A paper copy in the mail If you have any lab test that is abnormal or we need to change your treatment, we will call you to review the results.  Testing/Procedures: NONE  Follow-Up: At [facility], you and your health needs are our priority.  As part of our continuing mission to provide you with exceptional heart care, we have created designated Provider Care Teams.  These Care Teams include your primary Cardiologist (physician) and Advanced Practice Providers (APPs -  Physician Assistants and Nurse Practitioners) who all work together to provide you with the care you need, when you need it. . You may schedule a follow up appointment AS NEEDED. You may see Dr. Gwenlyn Found or one of the following Advanced Practice Providers on your designated Care Team:   . Kerin Ransom, [place] . Almyra Deforest, PA-C . Fabian Sharp, PA-C . Jory Sims, DNP . Rosaria Ferries, PA-C . Roby Lofts, PA-C . Sande Rives, PA-C  Any Other Special Instructions Will Be Listed Below (If Applicable). YOU HAVE BEEN CLEARED AT LOW RISK, FROM A CARDIAC STANDPOINT, FOR YOUR UPCOMING PROCEDURE.

## 2019-02-22 NOTE — Assessment & Plan Note (Signed)
Terri Shah was referred for preoperative clearance before elective left total knee replacement by Dr. Alvan Dame.  She had a negative Myoview approximately 7 years ago.  Her cardiac risk factors include hypertension and hyperlipidemia.  She is asymptomatic.  I am clearing her low risk without the need for functional study.

## 2019-02-22 NOTE — Assessment & Plan Note (Signed)
History of essential hypertension with blood pressure measured today at 110/64.  She is on hydrochlorothiazide.

## 2019-03-07 ENCOUNTER — Ambulatory Visit: Payer: Self-pay | Admitting: Cardiovascular Disease

## 2019-03-13 ENCOUNTER — Other Ambulatory Visit: Payer: Self-pay

## 2019-03-13 ENCOUNTER — Telehealth: Payer: Self-pay | Admitting: Gastroenterology

## 2019-03-13 DIAGNOSIS — K625 Hemorrhage of anus and rectum: Secondary | ICD-10-CM

## 2019-03-13 NOTE — Telephone Encounter (Signed)
Pt states last night she had a diverticular bleed. She does not want to go to the hospital. Pt wants to stay at home and see if this resolves, states the last time she went to he bathroom she saw some clots so she feels it is slowing down and will stop. She would like to know what type of diet she should be following and what Dr. Havery Moros recommends. Please advise.

## 2019-03-13 NOTE — Telephone Encounter (Signed)
Pt called in stated that she is having some bleeding little nausea but no pain. Will like for the nurse to call and advised.

## 2019-03-13 NOTE — Telephone Encounter (Signed)
If she is having any significant bleeding that is persisting, she should go to the hospital. If bleeding is worsening, she should go to the hospital. Hopefully this is slowing down as you report. I would recommend she stay on a liquid diet for today and then low residual diet for a few days. She should go to the lab for a CBC to ensure no significant anemia has developed.

## 2019-03-13 NOTE — Telephone Encounter (Signed)
Spoke with pt and she is aware, order for lab in epic.

## 2019-03-14 ENCOUNTER — Other Ambulatory Visit (INDEPENDENT_AMBULATORY_CARE_PROVIDER_SITE_OTHER): Payer: Medicare HMO

## 2019-03-14 DIAGNOSIS — K625 Hemorrhage of anus and rectum: Secondary | ICD-10-CM

## 2019-03-14 LAB — CBC WITH DIFFERENTIAL/PLATELET
Basophils Absolute: 0.1 10*3/uL (ref 0.0–0.1)
Basophils Relative: 0.6 % (ref 0.0–3.0)
Eosinophils Absolute: 0 10*3/uL (ref 0.0–0.7)
Eosinophils Relative: 0.4 % (ref 0.0–5.0)
HCT: 29.7 % — ABNORMAL LOW (ref 36.0–46.0)
Hemoglobin: 10 g/dL — ABNORMAL LOW (ref 12.0–15.0)
Lymphocytes Relative: 13.9 % (ref 12.0–46.0)
Lymphs Abs: 1.5 10*3/uL (ref 0.7–4.0)
MCHC: 33.7 g/dL (ref 30.0–36.0)
MCV: 90.4 fl (ref 78.0–100.0)
Monocytes Absolute: 1 10*3/uL (ref 0.1–1.0)
Monocytes Relative: 9.3 % (ref 3.0–12.0)
Neutro Abs: 8 10*3/uL — ABNORMAL HIGH (ref 1.4–7.7)
Neutrophils Relative %: 75.8 % (ref 43.0–77.0)
Platelets: 333 10*3/uL (ref 150.0–400.0)
RBC: 3.28 Mil/uL — ABNORMAL LOW (ref 3.87–5.11)
RDW: 13.2 % (ref 11.5–15.5)
WBC: 10.6 10*3/uL — ABNORMAL HIGH (ref 4.0–10.5)

## 2019-03-17 ENCOUNTER — Telehealth: Payer: Self-pay | Admitting: Gastroenterology

## 2019-03-17 NOTE — Telephone Encounter (Signed)
Patient called with concerns about her blood pressure.   She had two days of BRBPR on 3/23 and 3/24. Has persistent weakness after the bleeding. Drinking Ensure and Pedialyte to improve her weakness.   Notes similar episodes that have been occurring since 1992. Findings that it is harder to rebound after bleeding as she gets older.  Today her blood pressure has been 102/30 and 117/67. Continues on daily benazepril and hydrochlorathiazide.  She was thinking her blood pressure was low and was looking for guidance. Review of EPIC shows that on her cardiology evaluation earlier this month Dr. Gwenlyn Found recorded a blood pressure of 110/64.  She denied any GI complaints at this time. I asked her to call Dr. Virgina Jock who prescribed her antihypertensives this afternoon to review her concerns.  I assured her that I would update Dr. Havery Moros, as she requested.    I spent 13 minutes on the phone with the patient this afternoon.

## 2019-03-19 NOTE — Telephone Encounter (Signed)
Thanks Joelene Millin for taking the time to call this patient, I agree with your recommendations.

## 2019-03-20 ENCOUNTER — Ambulatory Visit: Admit: 2019-03-20 | Payer: Medicare HMO | Admitting: Orthopedic Surgery

## 2019-03-20 SURGERY — ARTHROPLASTY, KNEE, TOTAL
Anesthesia: Spinal | Laterality: Left

## 2019-03-26 ENCOUNTER — Ambulatory Visit: Payer: Medicare HMO | Admitting: Physical Therapy

## 2019-05-10 DIAGNOSIS — M9902 Segmental and somatic dysfunction of thoracic region: Secondary | ICD-10-CM | POA: Diagnosis not present

## 2019-05-10 DIAGNOSIS — M5134 Other intervertebral disc degeneration, thoracic region: Secondary | ICD-10-CM | POA: Diagnosis not present

## 2019-05-10 DIAGNOSIS — M5136 Other intervertebral disc degeneration, lumbar region: Secondary | ICD-10-CM | POA: Diagnosis not present

## 2019-05-10 DIAGNOSIS — M9903 Segmental and somatic dysfunction of lumbar region: Secondary | ICD-10-CM | POA: Diagnosis not present

## 2019-05-22 DIAGNOSIS — M9903 Segmental and somatic dysfunction of lumbar region: Secondary | ICD-10-CM | POA: Diagnosis not present

## 2019-05-22 DIAGNOSIS — M5136 Other intervertebral disc degeneration, lumbar region: Secondary | ICD-10-CM | POA: Diagnosis not present

## 2019-05-22 DIAGNOSIS — M5134 Other intervertebral disc degeneration, thoracic region: Secondary | ICD-10-CM | POA: Diagnosis not present

## 2019-05-22 DIAGNOSIS — M9902 Segmental and somatic dysfunction of thoracic region: Secondary | ICD-10-CM | POA: Diagnosis not present

## 2019-05-29 DIAGNOSIS — E7849 Other hyperlipidemia: Secondary | ICD-10-CM | POA: Diagnosis not present

## 2019-05-29 DIAGNOSIS — I10 Essential (primary) hypertension: Secondary | ICD-10-CM | POA: Diagnosis not present

## 2019-05-29 DIAGNOSIS — M810 Age-related osteoporosis without current pathological fracture: Secondary | ICD-10-CM | POA: Diagnosis not present

## 2019-05-29 DIAGNOSIS — D649 Anemia, unspecified: Secondary | ICD-10-CM | POA: Diagnosis not present

## 2019-05-30 DIAGNOSIS — Z961 Presence of intraocular lens: Secondary | ICD-10-CM | POA: Diagnosis not present

## 2019-05-30 DIAGNOSIS — Z9842 Cataract extraction status, left eye: Secondary | ICD-10-CM | POA: Diagnosis not present

## 2019-05-30 DIAGNOSIS — H4051X1 Glaucoma secondary to other eye disorders, right eye, mild stage: Secondary | ICD-10-CM | POA: Diagnosis not present

## 2019-05-30 DIAGNOSIS — H5213 Myopia, bilateral: Secondary | ICD-10-CM | POA: Diagnosis not present

## 2019-05-30 DIAGNOSIS — H524 Presbyopia: Secondary | ICD-10-CM | POA: Diagnosis not present

## 2019-05-30 DIAGNOSIS — H1851 Endothelial corneal dystrophy: Secondary | ICD-10-CM | POA: Diagnosis not present

## 2019-05-30 DIAGNOSIS — T8529XD Other mechanical complication of intraocular lens, subsequent encounter: Secondary | ICD-10-CM | POA: Diagnosis not present

## 2019-06-04 DIAGNOSIS — M9903 Segmental and somatic dysfunction of lumbar region: Secondary | ICD-10-CM | POA: Diagnosis not present

## 2019-06-04 DIAGNOSIS — I10 Essential (primary) hypertension: Secondary | ICD-10-CM | POA: Diagnosis not present

## 2019-06-04 DIAGNOSIS — M5134 Other intervertebral disc degeneration, thoracic region: Secondary | ICD-10-CM | POA: Diagnosis not present

## 2019-06-04 DIAGNOSIS — M9902 Segmental and somatic dysfunction of thoracic region: Secondary | ICD-10-CM | POA: Diagnosis not present

## 2019-06-04 DIAGNOSIS — M5136 Other intervertebral disc degeneration, lumbar region: Secondary | ICD-10-CM | POA: Diagnosis not present

## 2019-06-04 DIAGNOSIS — R82998 Other abnormal findings in urine: Secondary | ICD-10-CM | POA: Diagnosis not present

## 2019-06-05 DIAGNOSIS — Z1331 Encounter for screening for depression: Secondary | ICD-10-CM | POA: Diagnosis not present

## 2019-06-05 DIAGNOSIS — I10 Essential (primary) hypertension: Secondary | ICD-10-CM | POA: Diagnosis not present

## 2019-06-05 DIAGNOSIS — R490 Dysphonia: Secondary | ICD-10-CM | POA: Diagnosis not present

## 2019-06-05 DIAGNOSIS — J309 Allergic rhinitis, unspecified: Secondary | ICD-10-CM | POA: Diagnosis not present

## 2019-06-05 DIAGNOSIS — R69 Illness, unspecified: Secondary | ICD-10-CM | POA: Diagnosis not present

## 2019-06-05 DIAGNOSIS — K5903 Drug induced constipation: Secondary | ICD-10-CM | POA: Diagnosis not present

## 2019-06-05 DIAGNOSIS — Z1339 Encounter for screening examination for other mental health and behavioral disorders: Secondary | ICD-10-CM | POA: Diagnosis not present

## 2019-06-05 DIAGNOSIS — D509 Iron deficiency anemia, unspecified: Secondary | ICD-10-CM | POA: Diagnosis not present

## 2019-06-05 DIAGNOSIS — H9190 Unspecified hearing loss, unspecified ear: Secondary | ICD-10-CM | POA: Diagnosis not present

## 2019-06-05 DIAGNOSIS — R3121 Asymptomatic microscopic hematuria: Secondary | ICD-10-CM | POA: Diagnosis not present

## 2019-06-05 DIAGNOSIS — H409 Unspecified glaucoma: Secondary | ICD-10-CM | POA: Diagnosis not present

## 2019-06-05 DIAGNOSIS — Z0000 Encounter for general adult medical examination without abnormal findings: Secondary | ICD-10-CM | POA: Diagnosis not present

## 2019-06-12 ENCOUNTER — Other Ambulatory Visit: Payer: Self-pay

## 2019-06-12 ENCOUNTER — Telehealth: Payer: Self-pay | Admitting: Gastroenterology

## 2019-06-12 DIAGNOSIS — K625 Hemorrhage of anus and rectum: Secondary | ICD-10-CM

## 2019-06-12 NOTE — Telephone Encounter (Signed)
I called patient to give lab results from Dr. Keane Police office (she already had them). Scheduled patient for In Person visit with Tye Savoy NP 06/26/19 at 2:00pm. Order in [place] for CBC and TIBC/Ferritin. Patient says she can come in Friday for the labs

## 2019-06-12 NOTE — Telephone Encounter (Signed)
Records arrived from Dr. Keane Police office:  Labs 05/29/19:  BUN 8, Cr 0.6 Hgb 8.1, HCV 27.2, plt 385, WBC 4.84, MCV 80.5 TSH 0.57  Terri Shah this patient needs a follow up visit for anemia if you can help coordinate. If no appointments open with me within 2 weeks, please book with one of the PAs. She should come back to the lab for CBC and TIBC / ferritin panel now if you can order that and ask her to do that. Thanks

## 2019-06-15 ENCOUNTER — Other Ambulatory Visit (INDEPENDENT_AMBULATORY_CARE_PROVIDER_SITE_OTHER): Payer: Medicare HMO

## 2019-06-15 DIAGNOSIS — K625 Hemorrhage of anus and rectum: Secondary | ICD-10-CM | POA: Diagnosis not present

## 2019-06-15 DIAGNOSIS — H4051X1 Glaucoma secondary to other eye disorders, right eye, mild stage: Secondary | ICD-10-CM | POA: Diagnosis not present

## 2019-06-15 LAB — CBC WITH DIFFERENTIAL/PLATELET
Basophils Absolute: 0.1 10*3/uL (ref 0.0–0.1)
Basophils Relative: 1 % (ref 0.0–3.0)
Eosinophils Absolute: 0.1 10*3/uL (ref 0.0–0.7)
Eosinophils Relative: 1 % (ref 0.0–5.0)
HCT: 29.6 % — ABNORMAL LOW (ref 36.0–46.0)
Hemoglobin: 9.2 g/dL — ABNORMAL LOW (ref 12.0–15.0)
Lymphocytes Relative: 15.8 % (ref 12.0–46.0)
Lymphs Abs: 0.9 10*3/uL (ref 0.7–4.0)
MCHC: 31 g/dL (ref 30.0–36.0)
MCV: 81.4 fl (ref 78.0–100.0)
Monocytes Absolute: 0.9 10*3/uL (ref 0.1–1.0)
Monocytes Relative: 14.8 % — ABNORMAL HIGH (ref 3.0–12.0)
Neutro Abs: 4 10*3/uL (ref 1.4–7.7)
Neutrophils Relative %: 67.4 % (ref 43.0–77.0)
Platelets: 360 10*3/uL (ref 150.0–400.0)
RBC: 3.63 Mil/uL — ABNORMAL LOW (ref 3.87–5.11)
RDW: 24.1 % — ABNORMAL HIGH (ref 11.5–15.5)
WBC: 6 10*3/uL (ref 4.0–10.5)

## 2019-06-16 LAB — IRON,TIBC AND FERRITIN PANEL
%SAT: 33 % (calc) (ref 16–45)
Ferritin: 11 ng/mL — ABNORMAL LOW (ref 16–288)
Iron: 138 ug/dL (ref 45–160)
TIBC: 421 mcg/dL (calc) (ref 250–450)

## 2019-06-18 DIAGNOSIS — M9903 Segmental and somatic dysfunction of lumbar region: Secondary | ICD-10-CM | POA: Diagnosis not present

## 2019-06-18 DIAGNOSIS — M5134 Other intervertebral disc degeneration, thoracic region: Secondary | ICD-10-CM | POA: Diagnosis not present

## 2019-06-18 DIAGNOSIS — M5136 Other intervertebral disc degeneration, lumbar region: Secondary | ICD-10-CM | POA: Diagnosis not present

## 2019-06-18 DIAGNOSIS — M9902 Segmental and somatic dysfunction of thoracic region: Secondary | ICD-10-CM | POA: Diagnosis not present

## 2019-06-19 ENCOUNTER — Telehealth: Payer: Self-pay | Admitting: Gastroenterology

## 2019-06-19 NOTE — Telephone Encounter (Signed)
Pt is requesting a copy of her labs that she just had done to her PCP Dr. Virgina Jock. His fax is: [phone].

## 2019-06-19 NOTE — Telephone Encounter (Signed)
Spoke to patient and let her know her PCP has access to Epic, and he can see her lab results

## 2019-06-25 ENCOUNTER — Telehealth: Payer: Self-pay

## 2019-06-25 NOTE — Telephone Encounter (Signed)
Error

## 2019-06-25 NOTE — Telephone Encounter (Signed)
Covid-19 screening questions   Do you now or have you had a fever in the last 14 days no   Do you have any respiratory symptoms of shortness of breath or cough now or in the last 14 days no  Do you have any family members or close contacts with diagnosed or suspected Covid-19 in the past 14 days no  Have you been tested for Covid-19 and found to be positive no          

## 2019-06-26 ENCOUNTER — Encounter: Payer: Self-pay | Admitting: Nurse Practitioner

## 2019-06-26 ENCOUNTER — Other Ambulatory Visit: Payer: Self-pay

## 2019-06-26 ENCOUNTER — Ambulatory Visit (INDEPENDENT_AMBULATORY_CARE_PROVIDER_SITE_OTHER): Payer: Medicare HMO | Admitting: Nurse Practitioner

## 2019-06-26 VITALS — BP 126/66 | HR 65 | Temp 98.0°F | Ht 61.0 in | Wt 127.4 lb

## 2019-06-26 DIAGNOSIS — D509 Iron deficiency anemia, unspecified: Secondary | ICD-10-CM | POA: Diagnosis not present

## 2019-06-26 NOTE — Progress Notes (Signed)
Chief Complaint:   Recurrent iron deficiency anemia   IMPRESSION and PLAN:     85 year old female with chronic iron deficiency anemia previously evaluated in 2011.  She has also had multiple, recurrent episodes of hematochezia through the years, felt to be diverticular bleeds.  None of these episodes since March but she has recurrent IDA.  -Patient would be due for colonoscopy on the basis of adenomatous polyps removed at time of last colonoscopy December 2016.  However, given age and absence of overt GI bleeding will not pursue colonoscopy right now.  She is open to repeat small bowel video capsule study to look for AVMs or other potential causes for anemia.  There was a "bulge" at 2 hours and 15 minutes found on last capsule study in 2011, probably not clinically significant but can be reevaluated at time of capsule study. -continue oral iron as prescribed by PCP.    HPI:     Patient is an 85 year old female previously followed by Dr. Olevia Perches, now known to Dr. Havery Moros.  She has a history of esophageal stricture, Barrett's esophagus (EGD 2011 but not on follow up in 2014),  duodenal diverticulum , multiple diverticular bleeds and chronic IDA dating back to at least 2008 .   Workup for IDA in 2011 was unrevealing including colonoscopy and small bowel capsule study which suggested possible submucosal bulge at 2 hours 15 minutes but no AVMs or other findings. Patient has continued to have periodic episodes of what has been felt to be diverticular hemorrhages.  Apparently declined surgery at some point.   December 2016 patient was hospitalized with hematochezia felt to be another diverticular hemorrhage. Inpatient colonoscopy revealed and colonic diverticulosis and 3 polyps which were removed and path c/w adenomas.  Plan was for repeat surveillance colonoscopy in 3 years if she was medically stable and willing.   We received a phone call from patient in late March of this year with  complaints of significant rectal bleeding.  Patient did not want to go to the hospital, she felt like the bleeding had started to slow down.  We brought her in for labs which showed hemoglobin of 10.    Patient has been worked in for worsening anemia.  Recent hemoglobin 8.1, MCV 80.5.  Ferritin is low at 11.  TIBC 421, iron percent saturation 33.  He has not had any overt bleeding since March as described above.  No NSAID use.  No abdominal pain, nausea or vomiting.  Review of systems:     No chest pain, no SOB, no fevers, no urinary sx   Past Medical History:  Diagnosis Date  . Anemia   . Arthritis   . Barrett esophagus   . Barrett's esophagus 11/02/2007   Qualifier: Diagnosis of  By: Mat Carne    . Cataract   . Diverticulosis   . ECTOPIC PREGNANCY 02/08/2008   Qualifier: Diagnosis of  By: Mat Carne    . Gastritis   . GASTRITIS 11/02/2007   Qualifier: Diagnosis of  By: Mat Carne    . Hiatal hernia   . History of GI diverticular bleed   . Hyperlipidemia   . Hypertension   . Iron deficiency anemia   . Palpitations 04/02/2010   Qualifier: Diagnosis of  By: Angelena Form, MD, Harrell Gave    . Presbyesophagus     Patient's surgical history, family medical history, social history, medications and allergies were all reviewed in Epic   Creatinine clearance  cannot be calculated (Patient's most recent lab result is older than the maximum 21 days allowed.)  Current Outpatient Medications  Medication Sig Dispense Refill  . acetaminophen (TYLENOL) 500 MG tablet Take 500 mg by mouth every 6 (six) hours as needed. Patient used this medication for her pain.    Marland Kitchen atorvastatin (LIPITOR) 20 MG tablet Take 20 mg by mouth daily.      . benazepril (LOTENSIN) 40 MG tablet Take 20 mg by mouth daily. Takes 1/2 tablet    . denosumab (PROLIA) 60 MG/ML SOLN injection Inject 60 mg into the skin every 6 (six) months. Administer in upper arm, thigh, or abdomen    . dorzolamide-timolol (COSOPT)  22.3-6.8 MG/ML ophthalmic solution Place 1 drop into the right eye 2 (two) times daily.     . Ferrous Sulfate (SLOW FE) 142 (45 Fe) MG TBCR Take 1 tablet by mouth daily.    . hydrochlorothiazide (HYDRODIURIL) 25 MG tablet Take 25 mg by mouth every other day.     . latanoprost (XALATAN) 0.005 % ophthalmic solution Place 1 drop into the right eye daily.    . Menthol, Topical Analgesic, (BIOFREEZE ROLL-ON EX) Apply topically as needed.    Marland Kitchen omeprazole (PRILOSEC) 20 MG capsule Take 20 mg by mouth daily.     . polyvinyl alcohol (LIQUIFILM TEARS) 1.4 % ophthalmic solution Place 1 drop into both eyes daily as needed for dry eyes.    . prednisoLONE acetate (PRED FORTE) 1 % ophthalmic suspension Place 1 drop into the right eye daily.     . Psyllium (METAMUCIL PO) Take 1 each by mouth daily. 1 heaping teaspoon daily    . Saline 2.65 % SOLN Place 1 each into the nose daily as needed (congestion).    . traMADol (ULTRAM) 50 MG tablet Take 50 mg by mouth every 6 (six) hours as needed for moderate pain.     . Vitamin D, Cholecalciferol, 50 MCG (2000 UT) CAPS Take 1 capsule by mouth daily.    Marland Kitchen amitriptyline (ELAVIL) 25 MG tablet Take 25 mg by mouth at bedtime as needed for sleep.     . calcium carbonate (TUMS EX) 750 MG chewable tablet Chew 1 tablet by mouth daily as needed for heartburn.     No current facility-administered medications for this visit.     Physical Exam:     BP 126/66   Pulse 65   Temp 98 F (36.7 C)   Ht 5\' 1"  (1.549 m)   Wt 127 lb 6 oz (57.8 kg)   BMI 24.07 kg/m   GENERAL:  Pleasant female in NAD PSYCH: : Cooperative, normal affect EENT:  conjunctiva pink, mucous membranes moist, neck supple without masses CARDIAC:  RRR,  no peripheral edema PULM: Normal respiratory effort, lungs CTA bilaterally, no wheezing ABDOMEN:  Nondistended, soft, nontender. No obvious masses, no hepatomegaly,  normal bowel sounds SKIN:  turgor, no lesions seen Musculoskeletal:  Normal muscle tone,  normal strength NEURO: Alert and oriented x 3, no focal neurologic deficits   Tye Savoy , NP 06/26/2019, 2:49 PM

## 2019-06-26 NOTE — Patient Instructions (Signed)
If you are age 83 or older, your body mass index should be between 23-30. Your Body mass index is 24.07 kg/m. If this is out of the aforementioned range listed, please consider follow up with your Primary Care Provider.  If you are age 104 or younger, your body mass index should be between 19-25. Your Body mass index is 24.07 kg/m. If this is out of the aformentioned range listed, please consider follow up with your Primary Care Provider.     [facility] CAPSULE ENDOSCOPY PATIENT INSTRUCTION SHEET  Terri Shah June 12, 1934 [id number]   1. 07/04/19 Seven (7) days prior to capsule endoscopy stop taking iron supplements and carafate.  2. 07/09/19 Two (2) days prior to capsule endoscopy stop taking aspirin or any arthritis drugs.  3. 07/10/19 Day before capsule endoscopy purchase a 238 gram bottle of Miralax from the laxative section of your drug store, and a 32 oz. bottle of Gatorade (no red).    4. 07/10/19 One (1) day prior to capsule endoscopy: a) Stop smoking. b) Eat a regular diet until 12:00 Noon. c) After 12:00 Noon take only the following: Black coffee  Jell-O (no fruit or red Jell-o) Water   Bouillon (chicken or beef) 7-Up   Cranberry Juice Tea   Kool-Aid Popsicle (not red) Sprite   Coke Ginger Ale  Pepsi Mountain Dew Gatorade d) At 6:00 pm the evening before your appointment, drink 7 capfuls (105 grams) of Miralax with 32 oz. Gatorade. Drink 8 oz every 15 minutes until gone. e) Nothing to eat or drink after midnight except medications with a sip of water.  5. 07/11/19 Day of capsule endoscopy:            Do not have anything to eat or drink after midnight No medications for 2 hours prior to your test.  6. Please arrive at [facility]  3rd floor patient registration area by 8:30 am on: 07/11/19.   For any questions: Call [facility] at [phone] and ask to speak with one of the capsule endoscopy nurses.     What you should know.   You have been scheduled for  a Capsule Endoscopy with Capsocam.  You will swallow a vitamin size pill that contains cameras that will take pictures of your small intestine (bowel).  Your small bowel connects to your stomach on one end and the large intestine or bowel on the other. The capsule moves through the gastrointestinal tract taking pictures along the way. The images are stored on the capsule.  1-5 days after the capsule ingestion you will retrieve the capsule and mail it in a prepaid envelope to [employer] to download the images.  Your physician will be provided a copy of the images to review.   Who should have a capsule endoscopy?  You may need a small bowel capsule endoscopy if you have symptoms, such as blood in your stool, chronic pain, diarrhea, or unexplained anemia. The small intestine is a hollow organ that cannot be easily visualized with a scope due to its length.  The capsule endoscopy allows your physician to directly visualize the intestine.  The pictures may show intestine growths, inflammation, or bleeding in the small intestine.   What are the risks with a capsule endoscopy?  The pictures may not be clear or give [place] a clear cause of your symptoms The capsule may become trapped in the esophagus or intestines.  You may need surgery or endoscopic procedures to remove the capsule. You may not have a  MRI until the capsule endoscopy has been retrieved.   How do I retrieve the capsule?  You will be provided a kit with step-by-step instructions for capsule retrieval and mailing.  You should expect to retrieve the capsule in 1-5 days after ingestion.  This will depend on your individual bowel habits.   Thank you for choosing me and [facility].   Terri Savoy, NP

## 2019-06-27 ENCOUNTER — Encounter: Payer: Self-pay | Admitting: Nurse Practitioner

## 2019-06-27 NOTE — Progress Notes (Signed)
Agree with assessment as outlined. Colonoscopy previously done a few years ago for anemia did not show a clear cause, incidentally a few small polyps removed and she has declined surveillance colonoscopy. Her iron deficiency anemia dates back years, no cause on prior EGDs. There was a subtle "bulge" on prior capsule endoscopy in mid small bowel years ago. Patient has opted to avoid anesthesia / EGD / colonoscopy if possible and wanted to proceed with capsule initially - this will reassess her small bowel and should get some images of her stomach and right colon as well. Continue iron, trend Hgb. Further recommendations pending that result

## 2019-06-28 DIAGNOSIS — M5134 Other intervertebral disc degeneration, thoracic region: Secondary | ICD-10-CM | POA: Diagnosis not present

## 2019-06-28 DIAGNOSIS — M9902 Segmental and somatic dysfunction of thoracic region: Secondary | ICD-10-CM | POA: Diagnosis not present

## 2019-06-28 DIAGNOSIS — M5136 Other intervertebral disc degeneration, lumbar region: Secondary | ICD-10-CM | POA: Diagnosis not present

## 2019-06-28 DIAGNOSIS — M9903 Segmental and somatic dysfunction of lumbar region: Secondary | ICD-10-CM | POA: Diagnosis not present

## 2019-07-09 ENCOUNTER — Other Ambulatory Visit: Payer: Self-pay

## 2019-07-09 ENCOUNTER — Encounter (HOSPITAL_COMMUNITY): Payer: Self-pay

## 2019-07-09 ENCOUNTER — Ambulatory Visit (HOSPITAL_COMMUNITY)
Admission: RE | Admit: 2019-07-09 | Discharge: 2019-07-09 | Disposition: A | Discharge status: Home / Self Care | Payer: Medicare HMO | Source: Ambulatory Visit | Attending: Internal Medicine | Admitting: Internal Medicine

## 2019-07-09 DIAGNOSIS — M810 Age-related osteoporosis without current pathological fracture: Secondary | ICD-10-CM | POA: Diagnosis not present

## 2019-07-09 MED ORDER — DENOSUMAB 60 MG/ML ~~LOC~~ SOSY
60.0000 mg | PREFILLED_SYRINGE | Freq: Once | SUBCUTANEOUS | Status: AC
  Administered 2019-07-09: 60 mg via SUBCUTANEOUS
  Filled 2019-07-09: qty 1

## 2019-07-09 NOTE — Discharge Instructions (Signed)
Denosumab injection °What is this medicine? °DENOSUMAB (den oh sue mab) slows bone breakdown. Prolia is used to treat osteoporosis in women after menopause and in men, and in people who are taking corticosteroids for 6 months or more. Xgeva is used to treat a high calcium level due to cancer and to prevent bone fractures and other bone problems caused by multiple myeloma or cancer bone metastases. Xgeva is also used to treat giant cell tumor of the bone. °This medicine may be used for other purposes; ask your health care provider or pharmacist if you have questions. °COMMON BRAND NAME(S): Prolia, XGEVA °What should I tell my health care provider before I take this medicine? °They need to know if you have any of these conditions: °· dental disease °· having surgery or tooth extraction °· infection °· kidney disease °· low levels of calcium or Vitamin D in the blood °· malnutrition °· on hemodialysis °· skin conditions or sensitivity °· thyroid or parathyroid disease °· an unusual reaction to denosumab, other medicines, foods, dyes, or preservatives °· pregnant or trying to get pregnant °· breast-feeding °How should I use this medicine? °This medicine is for injection under the skin. It is given by a health care professional in a hospital or clinic setting. °A special MedGuide will be given to you before each treatment. Be sure to read this information carefully each time. °For Prolia, talk to your pediatrician regarding the use of this medicine in children. Special care may be needed. For Xgeva, talk to your pediatrician regarding the use of this medicine in children. While this drug may be prescribed for children as young as 13 years for selected conditions, precautions do apply. °Overdosage: If you think you have taken too much of this medicine contact a poison control center or emergency room at once. °NOTE: This medicine is only for you. Do not share this medicine with others. °What if I miss a dose? °It is  important not to miss your dose. Call your doctor or health care professional if you are unable to keep an appointment. °What may interact with this medicine? °Do not take this medicine with any of the following medications: °· other medicines containing denosumab °This medicine may also interact with the following medications: °· medicines that lower your chance of fighting infection °· steroid medicines like prednisone or cortisone °This list may not describe all possible interactions. Give your health care provider a list of all the medicines, herbs, non-prescription drugs, or dietary supplements you use. Also tell them if you smoke, drink alcohol, or use illegal drugs. Some items may interact with your medicine. °What should I watch for while using this medicine? °Visit your doctor or health care professional for regular checks on your progress. Your doctor or health care professional may order blood tests and other tests to see how you are doing. °Call your doctor or health care professional for advice if you get a fever, chills or sore throat, or other symptoms of a cold or flu. Do not treat yourself. This drug may decrease your body's ability to fight infection. Try to avoid being around people who are sick. °You should make sure you get enough calcium and vitamin D while you are taking this medicine, unless your doctor tells you not to. Discuss the foods you eat and the vitamins you take with your health care professional. °See your dentist regularly. Brush and floss your teeth as directed. Before you have any dental work done, tell your dentist you are   receiving this medicine. Do not become pregnant while taking this medicine or for 5 months after stopping it. Talk with your doctor or health care professional about your birth control options while taking this medicine. Women should inform their doctor if they wish to become pregnant or think they might be pregnant. There is a potential for serious side  effects to an unborn child. Talk to your health care professional or pharmacist for more information. What side effects may I notice from receiving this medicine? Side effects that you should report to your doctor or health care professional as soon as possible:  allergic reactions like skin rash, itching or hives, swelling of the face, lips, or tongue  bone pain  breathing problems  dizziness  jaw pain, especially after dental work  redness, blistering, peeling of the skin  signs and symptoms of infection like fever or chills; cough; sore throat; pain or trouble passing urine  signs of low calcium like fast heartbeat, muscle cramps or muscle pain; pain, tingling, numbness in the hands or feet; seizures  unusual bleeding or bruising  unusually weak or tired Side effects that usually do not require medical attention (report to your doctor or health care professional if they continue or are bothersome):  constipation  diarrhea  headache  joint pain  loss of appetite  muscle pain  runny nose  tiredness  upset stomach This list may not describe all possible side effects. Call your doctor for medical advice about side effects. You may report side effects to FDA at 1-800-FDA-1088. Where should I keep my medicine? This medicine is only given in a clinic, doctor's office, or other health care setting and will not be stored at home. NOTE: This sheet is a summary. It may not cover all possible information. If you have questions about this medicine, talk to your doctor, pharmacist, or health care provider.  2020 Elsevier/Gold Standard (2018-04-14 16:10:44)

## 2019-07-11 ENCOUNTER — Encounter: Payer: Self-pay | Admitting: Gastroenterology

## 2019-07-11 ENCOUNTER — Ambulatory Visit (INDEPENDENT_AMBULATORY_CARE_PROVIDER_SITE_OTHER): Payer: Medicare HMO | Admitting: Gastroenterology

## 2019-07-11 DIAGNOSIS — D509 Iron deficiency anemia, unspecified: Secondary | ICD-10-CM | POA: Diagnosis not present

## 2019-07-11 NOTE — Progress Notes (Signed)
Patient tolerated ingestion of capsule without difficulty. Retrieval instructions verbally and visually demonstrated by this RN. Patient verbalized understanding. Instructions included in kit.  LOT: [id number] SN: [id number] EXP: 2020-11-06

## 2019-07-19 ENCOUNTER — Telehealth: Payer: Self-pay | Admitting: Gastroenterology

## 2019-07-19 DIAGNOSIS — R7989 Other specified abnormal findings of blood chemistry: Secondary | ICD-10-CM | POA: Diagnosis not present

## 2019-07-19 DIAGNOSIS — D509 Iron deficiency anemia, unspecified: Secondary | ICD-10-CM | POA: Diagnosis not present

## 2019-07-19 DIAGNOSIS — I10 Essential (primary) hypertension: Secondary | ICD-10-CM | POA: Diagnosis not present

## 2019-07-19 NOTE — Telephone Encounter (Signed)
Called patient and gave capsule endo results. Patient didn't want to do her EGD on 07/31/19, so scheduled her on 08/09/19 at 9:00am in the [place] [facility]. Pre visit scheduled 08/02/19 patient to arrive at 9:45am.

## 2019-07-19 NOTE — Telephone Encounter (Signed)
Capsule endoscopy performed to evaluate iron deficiency, results as outlined:  Prominent / erythematous gastric folds. Capsule appeared to be near the junction of the stomach and duodenum for a few hours, perhaps got stuck in the duodenal diverticulum. The prep was poor and while no obvious small bowel lesions were noted, the study was not high quality.   Given the gastric findings, and her recurrent iron deficiency, I think EGD is reasonable at this point.    Sherlynn Stalls can you please let the patient know there is some abnormalities in her stomach on the capsule test, for which an EGD is recommended. Small bowel evaluation was limited due to the prep but nothing obvious noted otherwise to account for her anemia. Can you schedule for EGD if she is agreeable. I have openings on 8/11. Thanks

## 2019-07-20 ENCOUNTER — Telehealth: Payer: Self-pay | Admitting: Gastroenterology

## 2019-07-20 NOTE — Telephone Encounter (Signed)
Labs arrived from 07/20/19 - Dr. Keane Police office:  Hgb 11.4, HCT 36.5, iron 57, iron sat 16%, TIBC 362

## 2019-07-26 DIAGNOSIS — R69 Illness, unspecified: Secondary | ICD-10-CM | POA: Diagnosis not present

## 2019-07-31 ENCOUNTER — Other Ambulatory Visit: Payer: Self-pay | Admitting: Internal Medicine

## 2019-07-31 DIAGNOSIS — Z1231 Encounter for screening mammogram for malignant neoplasm of breast: Secondary | ICD-10-CM

## 2019-08-02 ENCOUNTER — Other Ambulatory Visit: Payer: Self-pay

## 2019-08-02 ENCOUNTER — Ambulatory Visit (AMBULATORY_SURGERY_CENTER): Payer: Self-pay | Admitting: *Deleted

## 2019-08-02 VITALS — Temp 97.3°F | Ht 61.0 in | Wt 127.0 lb

## 2019-08-02 DIAGNOSIS — D509 Iron deficiency anemia, unspecified: Secondary | ICD-10-CM

## 2019-08-02 NOTE — Progress Notes (Signed)
Pt is aware that care partner will wait in the car during procedure; if they feel like they will be too hot to wait in the car; they may wait in the lobby.  We want them to wear a mask (we do not have any that we can provide them), practice social distancing, and we will check their temperatures when they get here.  I did remind patient that their care partner needs to stay in the parking lot the entire time. Pt will wear mask into building.  No egg or soy allergy  No home oxygen use or problems with anesthesia  No medications for weight loss taken  emmi information given 

## 2019-08-03 DIAGNOSIS — M810 Age-related osteoporosis without current pathological fracture: Secondary | ICD-10-CM | POA: Diagnosis not present

## 2019-08-08 ENCOUNTER — Telehealth: Payer: Self-pay | Admitting: Gastroenterology

## 2019-08-08 NOTE — Telephone Encounter (Signed)

## 2019-08-09 ENCOUNTER — Ambulatory Visit (AMBULATORY_SURGERY_CENTER): Payer: Medicare HMO | Admitting: Gastroenterology

## 2019-08-09 ENCOUNTER — Other Ambulatory Visit: Payer: Self-pay

## 2019-08-09 ENCOUNTER — Encounter: Payer: Self-pay | Admitting: Gastroenterology

## 2019-08-09 VITALS — BP 143/59 | HR 62 | Temp 97.7°F | Resp 14 | Ht 61.0 in | Wt 127.0 lb

## 2019-08-09 DIAGNOSIS — D509 Iron deficiency anemia, unspecified: Secondary | ICD-10-CM

## 2019-08-09 DIAGNOSIS — K219 Gastro-esophageal reflux disease without esophagitis: Secondary | ICD-10-CM

## 2019-08-09 DIAGNOSIS — K449 Diaphragmatic hernia without obstruction or gangrene: Secondary | ICD-10-CM | POA: Diagnosis not present

## 2019-08-09 DIAGNOSIS — K5710 Diverticulosis of small intestine without perforation or abscess without bleeding: Secondary | ICD-10-CM

## 2019-08-09 DIAGNOSIS — K225 Diverticulum of esophagus, acquired: Secondary | ICD-10-CM

## 2019-08-09 DIAGNOSIS — K2950 Unspecified chronic gastritis without bleeding: Secondary | ICD-10-CM | POA: Diagnosis not present

## 2019-08-09 MED ORDER — SODIUM CHLORIDE 0.9 % IV SOLN: 500.0000 mL | Freq: Once | INTRAVENOUS | Status: DC

## 2019-08-09 NOTE — Progress Notes (Signed)
Called to room to assist during endoscopic procedure.  Patient ID and intended procedure confirmed with present staff. Received instructions for my participation in the procedure from the performing physician.  

## 2019-08-09 NOTE — Op Note (Signed)
[place] Patient Name: Terri Shah Procedure Date: 08/09/2019 8:44 AM MRN: [id number] Endoscopist: Remo Lipps P. Havery Moros , MD Age: 85 Referring MD:  Date of Birth: 1934/03/14 Gender: Female Account #: [id number] Procedure:                Upper GI endoscopy Indications:              Iron deficiency anemia, abnormal capsule endoscopy                            - capsule retained in proximal duodenum / junction                            of gastric outlet of majority of study, thickened /                            erythematous folds of the stomach noted on capsule Medicines:                Monitored Anesthesia Care Procedure:                Pre-Anesthesia Assessment:                           - Prior to the procedure, a History and Physical                            was performed, and patient medications and                            allergies were reviewed. The patient's tolerance of                            previous anesthesia was also reviewed. The risks                            and benefits of the procedure and the sedation                            options and risks were discussed with the patient.                            All questions were answered, and informed consent                            was obtained. Prior Anticoagulants: The patient has                            taken no previous anticoagulant or antiplatelet                            agents. ASA Grade Assessment: II - A patient with                            mild systemic disease. After reviewing the risks  and benefits, the patient was deemed in                            satisfactory condition to undergo the procedure.                           After obtaining informed consent, the endoscope was                            passed under direct vision. Throughout the                            procedure, the patient's blood pressure, pulse, and      oxygen saturations were monitored continuously. The                            Endoscope was introduced through the mouth, and                            advanced to the second part of duodenum. The upper                            GI endoscopy was accomplished without difficulty.                            The patient tolerated the procedure well. Scope In: Scope Out: Findings:                 Esophagogastric landmarks were identified: the                            Z-line was found at 35 cm, the gastroesophageal                            junction was found at 35 cm and the upper extent of                            the gastric folds was found at 37 cm from the                            incisors.                           A 2 cm hiatal hernia was present.                           The exam of the esophagus was otherwise normal.                           Patchy mildly erythematous mucosa was found in the                            gastric antrum without focal ulceration.  The exam of the stomach was otherwise normal.                           Biopsies were taken with a cold forceps in the                            gastric body, at the incisura and in the gastric                            antrum for Helicobacter pylori testing.                           Two diverticulum were found in the duodenum - one                            in the sweep and another one in the mid second                            portion.                           The exam of the duodenum was otherwise normal. Complications:            No immediate complications. Estimated blood loss:                            Minimal. Estimated Blood Loss:     Estimated blood loss was minimal. Impression:               - Esophagogastric landmarks identified.                           - 2 cm hiatal hernia.                           - Normal esophagus otherwise                           - Erythematous  mucosa in the antrum.                           - Stomach otherwise normal - biopsies taken to rule                            out H pylori                           - 2 duodenal diverticuli - I suspect the capsule                            got retained in one of these leading to inadequate                            small bowel study.                           -  Normal duodenum otherwise.                           No overt cause for iron deficiency on EGD, will                            await pathology results. Recommendation:           - Patient has a contact number available for                            emergencies. The signs and symptoms of potential                            delayed complications were discussed with the                            patient. Return to normal activities tomorrow.                            Written discharge instructions were provided to the                            patient.                           - Resume previous diet.                           - Continue present medications.                           - Await pathology results. Remo Lipps P. Havery Moros, MD 08/09/2019 9:23:09 AM This report has been signed electronically.

## 2019-08-09 NOTE — Progress Notes (Signed)
Temperature- June Bullock VS- Courtney Washington  Pt's states no medical or surgical changes since previsit or office visit.  

## 2019-08-09 NOTE — Progress Notes (Signed)
To PACU, VSS. Report to [email or web address] 

## 2019-08-09 NOTE — Patient Instructions (Signed)
Discharge instructions given. Handout on hiatal Hernia. Biopsies taken. Resume previous medications. YOU HAD AN ENDOSCOPIC PROCEDURE TODAY AT [facility]:   Refer to the procedure report that was given to you for any specific questions about what was found during the examination.  If the procedure report does not answer your questions, please call your gastroenterologist to clarify.  If you requested that your care partner not be given the details of your procedure findings, then the procedure report has been included in a sealed envelope for you to review at your convenience later.  YOU SHOULD EXPECT: Some feelings of bloating in the abdomen. Passage of more gas than usual.  Walking can help get rid of the air that was put into your GI tract during the procedure and reduce the bloating. If you had a lower endoscopy (such as a colonoscopy or flexible sigmoidoscopy) you may notice spotting of blood in your stool or on the toilet paper. If you underwent a bowel prep for your procedure, you may not have a normal bowel movement for a few days.  Please Note:  You might notice some irritation and congestion in your nose or some drainage.  This is from the oxygen used during your procedure.  There is no need for concern and it should clear up in a day or so.  SYMPTOMS TO REPORT IMMEDIATELY:   Following upper endoscopy (EGD)  Vomiting of blood or coffee ground material  New chest pain or pain under the shoulder blades  Painful or persistently difficult swallowing  New shortness of breath  Fever of 100F or higher  Black, tarry-looking stools  For urgent or emergent issues, a gastroenterologist can be reached at any hour by calling [phone].   DIET:  We do recommend a small meal at first, but then you may proceed to your regular diet.  Drink plenty of fluids but you should avoid alcoholic beverages for 24 hours.  ACTIVITY:  You should plan to take it easy for the rest of today  and you should NOT DRIVE or use heavy machinery until tomorrow (because of the sedation medicines used during the test).    FOLLOW UP: Our staff will call the number listed on your records 48-72 hours following your procedure to check on you and address any questions or concerns that you may have regarding the information given to you following your procedure. If we do not reach you, we will leave a message.  We will attempt to reach you two times.  During this call, we will ask if you have developed any symptoms of COVID 19. If you develop any symptoms (ie: fever, flu-like symptoms, shortness of breath, cough etc.) before then, please call [phone].  If you test positive for Covid 19 in the 2 weeks post procedure, please call and report this information to [place].    If any biopsies were taken you will be contacted by phone or by letter within the next 1-3 weeks.  Please call us at [phone] if you have not heard about the biopsies in 3 weeks.    SIGNATURES/CONFIDENTIALITY: You and/or your care partner have signed paperwork which will be entered into your electronic medical record.  These signatures attest to the fact that that the information above on your After Visit Summary has been reviewed and is understood.  Full responsibility of the confidentiality of this discharge information lies with you and/or your care-partner.

## 2019-08-13 ENCOUNTER — Telehealth: Payer: Self-pay | Admitting: *Deleted

## 2019-08-13 NOTE — Telephone Encounter (Signed)
  Follow up Call-  Call back number 08/09/2019  Post procedure Call Back phone  # [phone]  Permission to leave phone message Yes  Some recent data might be hidden     Patient questions:  Message left to call us if necessary.  Second call.

## 2019-08-13 NOTE — Telephone Encounter (Signed)
No answer, message left for the patient. 

## 2019-08-16 DIAGNOSIS — I10 Essential (primary) hypertension: Secondary | ICD-10-CM | POA: Diagnosis not present

## 2019-08-21 ENCOUNTER — Telehealth: Payer: Self-pay | Admitting: Gastroenterology

## 2019-08-21 NOTE — Telephone Encounter (Signed)
Good to hear the Hgb is stable. I would repeat in 3 months. Sounds like she does not want to take the iron which is recommended. If that is the case she will need to have this closely monitored moving forward. Thanks

## 2019-08-21 NOTE — Telephone Encounter (Signed)
Pt called to inform that she had lab work with PCP and her hemoglobin was good so she is not taking medications that Dr. Havery Moros recommended anymore.

## 2019-09-12 ENCOUNTER — Ambulatory Visit: Payer: Medicare HMO

## 2019-09-12 DIAGNOSIS — H4051X1 Glaucoma secondary to other eye disorders, right eye, mild stage: Secondary | ICD-10-CM | POA: Diagnosis not present

## 2019-09-24 ENCOUNTER — Telehealth: Payer: Self-pay | Admitting: Gastroenterology

## 2019-09-24 ENCOUNTER — Other Ambulatory Visit: Payer: Self-pay

## 2019-09-24 DIAGNOSIS — D509 Iron deficiency anemia, unspecified: Secondary | ICD-10-CM

## 2019-09-24 NOTE — Telephone Encounter (Signed)
Spoke to patient and she says 2 times this morning she had a small amt. Of bright red blood clots mixed in with her stool. No fever, dizziness, nor SOB. States she has a little pain in her LLQ. Said she would go on clear liquids for a day or 2.  And let us know if it gets worse. Please advise if you want her to do anything else

## 2019-09-24 NOTE — Telephone Encounter (Signed)
Thanks Sherlynn Stalls, sorry to hear this. She should have a CBC drawn to ensure stable. If she has persistent bleeding she needs to let us know, she has a history of suspected diverticular bleeding in the past. If this gets worse she will need to go to the ED.

## 2019-09-24 NOTE — Telephone Encounter (Signed)
Called patient with Dr.Armbruster's request to have a CBC drawn. Put order in Epic. Patient said if she has anymore bleeding she will come in and have it drawn. And agrees to let us know if any further bleeding occurs

## 2019-09-27 ENCOUNTER — Ambulatory Visit
Admission: RE | Admit: 2019-09-27 | Discharge: 2019-09-27 | Disposition: A | Discharge status: Home / Self Care | Payer: Medicare HMO | Source: Ambulatory Visit | Attending: Internal Medicine | Admitting: Internal Medicine

## 2019-09-27 ENCOUNTER — Other Ambulatory Visit: Payer: Self-pay

## 2019-09-27 ENCOUNTER — Other Ambulatory Visit (INDEPENDENT_AMBULATORY_CARE_PROVIDER_SITE_OTHER): Payer: Medicare HMO

## 2019-09-27 DIAGNOSIS — D509 Iron deficiency anemia, unspecified: Secondary | ICD-10-CM | POA: Diagnosis not present

## 2019-09-27 DIAGNOSIS — Z1231 Encounter for screening mammogram for malignant neoplasm of breast: Secondary | ICD-10-CM | POA: Diagnosis not present

## 2019-09-27 LAB — CBC WITH DIFFERENTIAL/PLATELET
Basophils Absolute: 0 10*3/uL (ref 0.0–0.1)
Basophils Relative: 0.6 % (ref 0.0–3.0)
Eosinophils Absolute: 0 10*3/uL (ref 0.0–0.7)
Eosinophils Relative: 0.5 % (ref 0.0–5.0)
HCT: 34.9 % — ABNORMAL LOW (ref 36.0–46.0)
Hemoglobin: 11.4 g/dL — ABNORMAL LOW (ref 12.0–15.0)
Lymphocytes Relative: 12.8 % (ref 12.0–46.0)
Lymphs Abs: 0.9 10*3/uL (ref 0.7–4.0)
MCHC: 32.6 g/dL (ref 30.0–36.0)
MCV: 90.4 fl (ref 78.0–100.0)
Monocytes Absolute: 0.7 10*3/uL (ref 0.1–1.0)
Monocytes Relative: 10.5 % (ref 3.0–12.0)
Neutro Abs: 5.3 10*3/uL (ref 1.4–7.7)
Neutrophils Relative %: 75.6 % (ref 43.0–77.0)
Platelets: 323 10*3/uL (ref 150.0–400.0)
RBC: 3.86 Mil/uL — ABNORMAL LOW (ref 3.87–5.11)
RDW: 13.7 % (ref 11.5–15.5)
WBC: 7.1 10*3/uL (ref 4.0–10.5)

## 2019-10-01 ENCOUNTER — Telehealth: Payer: Self-pay | Admitting: Gastroenterology

## 2019-10-01 DIAGNOSIS — M170 Bilateral primary osteoarthritis of knee: Secondary | ICD-10-CM | POA: Diagnosis not present

## 2019-10-08 DIAGNOSIS — R69 Illness, unspecified: Secondary | ICD-10-CM | POA: Diagnosis not present

## 2019-10-10 DIAGNOSIS — H6123 Impacted cerumen, bilateral: Secondary | ICD-10-CM | POA: Diagnosis not present

## 2019-10-10 DIAGNOSIS — Z7289 Other problems related to lifestyle: Secondary | ICD-10-CM | POA: Diagnosis not present

## 2019-10-10 DIAGNOSIS — H93293 Other abnormal auditory perceptions, bilateral: Secondary | ICD-10-CM | POA: Diagnosis not present

## 2019-11-20 DIAGNOSIS — M5136 Other intervertebral disc degeneration, lumbar region: Secondary | ICD-10-CM | POA: Diagnosis not present

## 2019-12-05 ENCOUNTER — Ambulatory Visit: Payer: Medicare HMO | Admitting: Physical Therapy

## 2019-12-07 DIAGNOSIS — M810 Age-related osteoporosis without current pathological fracture: Secondary | ICD-10-CM | POA: Diagnosis not present

## 2019-12-07 DIAGNOSIS — I10 Essential (primary) hypertension: Secondary | ICD-10-CM | POA: Diagnosis not present

## 2019-12-07 DIAGNOSIS — R490 Dysphonia: Secondary | ICD-10-CM | POA: Diagnosis not present

## 2019-12-07 DIAGNOSIS — K5903 Drug induced constipation: Secondary | ICD-10-CM | POA: Diagnosis not present

## 2019-12-07 DIAGNOSIS — H9190 Unspecified hearing loss, unspecified ear: Secondary | ICD-10-CM | POA: Diagnosis not present

## 2019-12-07 DIAGNOSIS — R69 Illness, unspecified: Secondary | ICD-10-CM | POA: Diagnosis not present

## 2019-12-07 DIAGNOSIS — M1990 Unspecified osteoarthritis, unspecified site: Secondary | ICD-10-CM | POA: Diagnosis not present

## 2019-12-07 DIAGNOSIS — D509 Iron deficiency anemia, unspecified: Secondary | ICD-10-CM | POA: Diagnosis not present

## 2019-12-07 DIAGNOSIS — E559 Vitamin D deficiency, unspecified: Secondary | ICD-10-CM | POA: Diagnosis not present

## 2019-12-07 DIAGNOSIS — H409 Unspecified glaucoma: Secondary | ICD-10-CM | POA: Diagnosis not present

## 2019-12-17 DIAGNOSIS — G8929 Other chronic pain: Secondary | ICD-10-CM | POA: Diagnosis not present

## 2019-12-17 DIAGNOSIS — E785 Hyperlipidemia, unspecified: Secondary | ICD-10-CM | POA: Diagnosis not present

## 2019-12-17 DIAGNOSIS — J309 Allergic rhinitis, unspecified: Secondary | ICD-10-CM | POA: Diagnosis not present

## 2019-12-17 DIAGNOSIS — K219 Gastro-esophageal reflux disease without esophagitis: Secondary | ICD-10-CM | POA: Diagnosis not present

## 2019-12-17 DIAGNOSIS — H04129 Dry eye syndrome of unspecified lacrimal gland: Secondary | ICD-10-CM | POA: Diagnosis not present

## 2019-12-17 DIAGNOSIS — I739 Peripheral vascular disease, unspecified: Secondary | ICD-10-CM | POA: Diagnosis not present

## 2019-12-17 DIAGNOSIS — H409 Unspecified glaucoma: Secondary | ICD-10-CM | POA: Diagnosis not present

## 2019-12-17 DIAGNOSIS — M1990 Unspecified osteoarthritis, unspecified site: Secondary | ICD-10-CM | POA: Diagnosis not present

## 2019-12-17 DIAGNOSIS — M810 Age-related osteoporosis without current pathological fracture: Secondary | ICD-10-CM | POA: Diagnosis not present

## 2019-12-17 DIAGNOSIS — Z008 Encounter for other general examination: Secondary | ICD-10-CM | POA: Diagnosis not present

## 2019-12-17 DIAGNOSIS — I10 Essential (primary) hypertension: Secondary | ICD-10-CM | POA: Diagnosis not present

## 2019-12-18 ENCOUNTER — Telehealth: Payer: Self-pay | Admitting: Gastroenterology

## 2019-12-18 ENCOUNTER — Ambulatory Visit: Payer: Medicare HMO | Attending: Physical Medicine and Rehabilitation | Admitting: Physical Therapy

## 2019-12-18 ENCOUNTER — Other Ambulatory Visit: Payer: Self-pay

## 2019-12-18 ENCOUNTER — Telehealth: Payer: Self-pay

## 2019-12-18 ENCOUNTER — Encounter: Payer: Self-pay | Admitting: Physical Therapy

## 2019-12-18 DIAGNOSIS — M25662 Stiffness of left knee, not elsewhere classified: Secondary | ICD-10-CM | POA: Diagnosis not present

## 2019-12-18 DIAGNOSIS — M810 Age-related osteoporosis without current pathological fracture: Secondary | ICD-10-CM | POA: Diagnosis not present

## 2019-12-18 DIAGNOSIS — E7849 Other hyperlipidemia: Secondary | ICD-10-CM | POA: Diagnosis not present

## 2019-12-18 DIAGNOSIS — M5442 Lumbago with sciatica, left side: Secondary | ICD-10-CM | POA: Insufficient documentation

## 2019-12-18 DIAGNOSIS — D509 Iron deficiency anemia, unspecified: Secondary | ICD-10-CM

## 2019-12-18 DIAGNOSIS — M25562 Pain in left knee: Secondary | ICD-10-CM

## 2019-12-18 DIAGNOSIS — R262 Difficulty in walking, not elsewhere classified: Secondary | ICD-10-CM | POA: Insufficient documentation

## 2019-12-18 DIAGNOSIS — Z7689 Persons encountering health services in other specified circumstances: Secondary | ICD-10-CM | POA: Diagnosis not present

## 2019-12-18 NOTE — Therapy (Signed)
[address] [place], [place], [address] Phone: [phone]   Fax:  [phone]  Physical Therapy Evaluation  Patient Details  Name: Terri Shah MRN: [id number] Date of Birth: Sep 12, 1934 Referring Provider (PT): Nelva Bush   Encounter Date: 12/18/2019  PT End of Session - 12/18/19 1345    Visit Number  1    Date for PT Re-Evaluation  02/18/20    PT Start Time  [id number]    PT Stop Time  1400    PT Time Calculation (min)  52 min    Activity Tolerance  Patient tolerated treatment well    Behavior During Therapy  [facility] for tasks assessed/performed       Past Medical History:  Diagnosis Date  . Allergy   . Anemia   . Arthritis   . Barrett esophagus   . Barrett's esophagus 11/02/2007   Qualifier: Diagnosis of  By: Mat Carne    . Blood transfusion without reported diagnosis   . Cataract   . Diverticulosis   . ECTOPIC PREGNANCY 02/08/2008   Qualifier: Diagnosis of  By: Mat Carne    . Gastritis   . GASTRITIS 11/02/2007   Qualifier: Diagnosis of  By: Mat Carne    . Glaucoma   . Heart murmur   . Hiatal hernia   . History of GI diverticular bleed   . Hyperlipidemia   . Hypertension   . Iron deficiency 2020  . Iron deficiency anemia   . Osteoporosis   . Palpitations 04/02/2010   Qualifier: Diagnosis of  By: Angelena Form, MD, Harrell Gave    . Presbyesophagus   . Stroke [facility])    "small stroke" 2010- no residual per pt    Past Surgical History:  Procedure Laterality Date  . CATARACT EXTRACTION, BILATERAL    . CESAREAN SECTION  1950-1960s   x 3  . CHOLECYSTECTOMY    . COLONOSCOPY N/A 12/19/2015   Procedure: COLONOSCOPY;  Surgeon: Manus Gunning, MD;  Location: [facility];  Service: Gastroenterology;  Laterality: N/A;  . COLONOSCOPY    . [place]  . FACIAL COSMETIC SURGERY    . LAPAROSCOPIC CHOLECYSTECTOMY SINGLE SITE WITH INTRAOPERATIVE CHOLANGIOGRAM  N/A 10/27/2014   Procedure: LAPAROSCOPIC CHOLECYSTECTOMY SINGLE SITE WITH INTRAOPERATIVE CHOLANGIOGRAM;  Surgeon: Idalee Foxworthy Boston, MD;  Location: [facility];  Service: General;  Laterality: N/A;  . TUBAL LIGATION    . UPPER GASTROINTESTINAL ENDOSCOPY      There were no vitals filed for this visit.   Subjective Assessment - 12/18/19 1312    Subjective  Patient was seen here in February to get ready for a TKA.  This was cancelled due to covid, she comes in today for LBP possibly due to the left knee pain.  She reports that she will need the need to be replaced but has not scheduled this yet.  Has DDD and arthritis in the lumbar spine    Pertinent History  arthritis, OP, HTN,  "small stroke"    How long can you walk comfortably?  difficulty walking in the past year due to pain    Patient Stated Goals  have less back and knee pain    Currently in Pain?  Yes    Pain Score  2     Pain Location  Back   left knee   Pain Orientation  Lower;Left    Pain Descriptors / Indicators  Aching;Dull  Pain Type  Acute pain    Pain Radiating Towards  reports some sciatica in the left thigh    Pain Onset  More than a month ago    Pain Frequency  Constant    Aggravating Factors   stif in the AM, , standing pain up to 8/10, weather may affect me    Pain Relieving Factors  pain meds, rest at best pain a 2/10    Effect of Pain on Daily Activities  reports limits walking and standing, yardwork and housework         [facility] PT Assessment - 12/18/19 0001      Assessment   Medical Diagnosis  LBP with left knee OA    Referring Provider (PT)  Ramos    Onset Date/Surgical Date  11/18/19      Precautions   Precautions  None      Restrictions   Weight Bearing Restrictions  No      Balance Screen   Has the patient fallen in the past 6 months  No    Has the patient had a decrease in activity level because of a fear of falling?   No    Is the patient reluctant to leave their home because of a fear of falling?   No       Home Environment   Additional Comments  has stairs, does some housework and yardwork      Prior Function   Level of Independence  Independent    Vocation  Retired    Leisure  she reports that she did like to walk for exercise but has not been able to due to knee and back pain      Posture/Postural Control   Posture Comments  slouched posture, decreased lordosis      AROM   Overall AROM Comments  crepitus with left knee extension, lumbar ROM is limited 50% for extension and side bending, flexion is WNL's    Left Knee Extension  5    Left Knee Flexion  110      Strength   Overall Strength Comments  strength is 4-/5 for th eLE's pain with MMT of the left knee      Flexibility   Soft Tissue Assessment /Muscle Length  yes   calf is tight   Hamstrings  mild tightness    Piriformis  mild tightness      Palpation   Palpation comment  crepitus with movements of the knee, very tight mild tenderness in the lumbar parapsinals                Objective measurements completed on examination: See above findings.      [place] Adult PT Treatment/Exercise - 12/18/19 0001      Modalities   Modalities  Moist Heat;Electrical Stimulation      Moist Heat Therapy   Number Minutes Moist Heat  12 Minutes    Moist Heat Location  Lumbar Spine      Electrical Stimulation   Electrical Stimulation Location  lumbar area    Electrical Stimulation Action  IFC    Electrical Stimulation Parameters  sitting    Electrical Stimulation Goals  Pain             PT Education - 12/18/19 1345    Education Details  HEP for WMs flexion    Person(s) Educated  Patient    Methods  Explanation;Demonstration;Handout    Comprehension  Verbalized understanding  PT Short Term Goals - 12/18/19 1354      PT SHORT TERM GOAL #1   Title  Ind with intial HEP    Time  1    Period  Weeks    Status  New        PT Long Term Goals - 12/18/19 1354      PT LONG TERM GOAL #1   Title   understnad posture and body mechanics    Time  8    Period  Weeks    Status  New      PT LONG TERM GOAL #2   Title  decrease pain 50%    Time  8    Period  Weeks    Status  New      PT LONG TERM GOAL #3   Title  increasre lumbar ROM 50%    Time  8    Period  Weeks    Status  New      PT LONG TERM GOAL #4   Title  increase left knee ROM to WNL's    Time  8    Period  Weeks    Status  New             Plan - 12/18/19 1346    Clinical Impression Statement  Patient was going to have a left TKA in March of last year, reports that due to covid that was postponed, reports that due to the limp she is having more and more low back pain, reports that she has stenosis and arthritis in the lumbar area.  She has difficulty walking and standing, has not been able to walk or do much exercise over the past 10 months    Personal Factors and Comorbidities  Comorbidity 3+    Comorbidities  OP, HTN, arthritis, "small stroke"    Stability/Clinical Decision Making  Stable/Uncomplicated    Clinical Decision Making  Low    Rehab Potential  Good    PT Frequency  2x / week    PT Duration  8 weeks    PT Treatment/Interventions  ADLs/Self Care Home Management;Cryotherapy;Electrical Stimulation;Moist Heat;Ultrasound;Therapeutic activities;Therapeutic exercise;Neuromuscular re-education;Manual techniques;Patient/family education    PT Next Visit Plan  start movements working on stability of the core    Consulted and Agree with Plan of Care  Patient       Patient will benefit from skilled therapeutic intervention in order to improve the following deficits and impairments:  Pain, Decreased strength, Decreased range of motion, Abnormal gait, Improper body mechanics, Increased muscle spasms, Decreased mobility, Cardiopulmonary status limiting activity, Postural dysfunction, Difficulty walking, Impaired flexibility  Visit Diagnosis: Acute pain of left knee - Plan: PT plan of care  cert/re-cert  Stiffness of left knee, not elsewhere classified - Plan: PT plan of care cert/re-cert  Difficulty in walking, not elsewhere classified - Plan: PT plan of care cert/re-cert  Acute bilateral low back pain with left-sided sciatica - Plan: PT plan of care cert/re-cert     Problem List Patient Active Problem List   Diagnosis Date Noted  . Preoperative clearance 02/22/2019  . Colon polyp   . Lower GI bleed 12/18/2015  . Diverticulosis of colon with hemorrhage   . Nausea with vomiting 12/02/2014  . Ileus, postoperative ([place]) 11/04/2014  . Hyponatremia 11/04/2014  . Abdominal fluid collection   . Chest pain 10/26/2014  . Acute calculous cholecystitis 10/26/2014  . Barrett esophagus   . History of GI diverticular bleed   . Essential  hypertension   . Dyspnea 01/18/2012  . Aortic insufficiency 01/18/2012  . ANEMIA, IRON DEFICIENCY 06/04/2010  . HYPERCHOLESTEROLEMIA 02/08/2008  . GERD 02/08/2008  . ARTHRITIS 02/08/2008  . HIATAL HERNIA 11/02/2007  . DIVERTICULOSIS OF COLON 04/07/2004    Sumner Boast., PT 12/18/2019, 1:57 PM  [address], [place], [address] Phone: [phone]   Fax:  [phone]  Name: Terri Shah MRN: [id number] Date of Birth: [birth year]

## 2019-12-18 NOTE — Telephone Encounter (Signed)
Patient is returning your call said she will be stepping out again but will be available after 3pm

## 2019-12-18 NOTE — Telephone Encounter (Signed)
Called patient to ask her to come in for repeat CBC, and she  states she was at her PCP today and had labs done, including a CBC. Scheduled a routine office visit with Dr. Havery Moros on 01/23/20

## 2019-12-25 ENCOUNTER — Ambulatory Visit: Payer: Medicare HMO | Attending: Physical Medicine and Rehabilitation | Admitting: Physical Therapy

## 2019-12-25 ENCOUNTER — Encounter: Payer: Self-pay | Admitting: Physical Therapy

## 2019-12-25 ENCOUNTER — Other Ambulatory Visit: Payer: Self-pay

## 2019-12-25 DIAGNOSIS — M5442 Lumbago with sciatica, left side: Secondary | ICD-10-CM | POA: Diagnosis not present

## 2019-12-25 DIAGNOSIS — R262 Difficulty in walking, not elsewhere classified: Secondary | ICD-10-CM | POA: Diagnosis not present

## 2019-12-25 DIAGNOSIS — M25662 Stiffness of left knee, not elsewhere classified: Secondary | ICD-10-CM | POA: Diagnosis not present

## 2019-12-25 DIAGNOSIS — M25562 Pain in left knee: Secondary | ICD-10-CM | POA: Diagnosis not present

## 2019-12-25 NOTE — Therapy (Signed)
[place], [place], [address] Phone: [phone]   Fax:  [phone]  Physical Therapy Treatment  Patient Details  Name: Terri Shah MRN: [id number] Date of Birth: 1934-05-13 Referring Provider (PT): Nelva Bush   Encounter Date: 12/25/2019  PT End of Session - 12/25/19 1603    Visit Number  2    Date for PT Re-Evaluation  02/18/20    PT Start Time  1528    PT Stop Time  1616    PT Time Calculation (min)  48 min    Activity Tolerance  Patient tolerated treatment well    Behavior During Therapy  [facility] for tasks assessed/performed       Past Medical History:  Diagnosis Date  . Allergy   . Anemia   . Arthritis   . Barrett esophagus   . Barrett's esophagus 11/02/2007   Qualifier: Diagnosis of  By: Mat Carne    . Blood transfusion without reported diagnosis   . Cataract   . Diverticulosis   . ECTOPIC PREGNANCY 02/08/2008   Qualifier: Diagnosis of  By: Mat Carne    . Gastritis   . GASTRITIS 11/02/2007   Qualifier: Diagnosis of  By: Mat Carne    . Glaucoma   . Heart murmur   . Hiatal hernia   . History of GI diverticular bleed   . Hyperlipidemia   . Hypertension   . Iron deficiency 2020  . Iron deficiency anemia   . Osteoporosis   . Palpitations 04/02/2010   Qualifier: Diagnosis of  By: Angelena Form, MD, Harrell Gave    . Presbyesophagus   . Stroke [facility])    "small stroke" 2010- no residual per pt    Past Surgical History:  Procedure Laterality Date  . CATARACT EXTRACTION, BILATERAL    . CESAREAN SECTION  1950-1960s   x 3  . CHOLECYSTECTOMY    . COLONOSCOPY N/A 12/19/2015   Procedure: COLONOSCOPY;  Surgeon: Manus Gunning, MD;  Location: [facility];  Service: Gastroenterology;  Laterality: N/A;  . COLONOSCOPY    . [place]  . FACIAL COSMETIC SURGERY    . LAPAROSCOPIC CHOLECYSTECTOMY SINGLE SITE WITH INTRAOPERATIVE CHOLANGIOGRAM N/A  10/27/2014   Procedure: LAPAROSCOPIC CHOLECYSTECTOMY SINGLE SITE WITH INTRAOPERATIVE CHOLANGIOGRAM;  Surgeon: Ryanna Teschner Boston, MD;  Location: [facility];  Service: General;  Laterality: N/A;  . TUBAL LIGATION    . UPPER GASTROINTESTINAL ENDOSCOPY      There were no vitals filed for this visit.  Subjective Assessment - 12/25/19 1531    Subjective  Back feels a little better with the exercises, the knees are really hurting    Currently in Pain?  Yes    Pain Score  3     Pain Location  Back   knee   Pain Relieving Factors  the stretches feel good                       [facility] Adult PT Treatment/Exercise - 12/25/19 0001      Exercises   Exercises  Lumbar      Lumbar Exercises: Stretches   Passive Hamstring Stretch  Right;Left;4 reps;10 seconds    Piriformis Stretch  Right;Left;4 reps;10 seconds      Lumbar Exercises: Aerobic   Nustep  level 3 x 6 minutes      Lumbar Exercises: Supine   Bridge with Ball Squeeze  20 reps;1  second    Bridge with [employer] Limitations  very small bridges as bigger bridges caused an increase of pain    Other Supine Lumbar Exercises  feet on ball K2C, rotation, small bridges and isometric abdominals      Modalities   Modalities  Moist Heat;Electrical Stimulation      Moist Heat Therapy   Number Minutes Moist Heat  12 Minutes    Moist Heat Location  Lumbar Spine      Electrical Stimulation   Electrical Stimulation Location  lumbar area    Electrical Stimulation Action  IFC    Electrical Stimulation Parameters  supine    Electrical Stimulation Goals  Pain               PT Short Term Goals - 12/25/19 1605      PT SHORT TERM GOAL #1   Title  Ind with intial HEP    Status  Partially Met        PT Long Term Goals - 12/18/19 1354      PT LONG TERM GOAL #1   Title  understnad posture and body mechanics    Time  8    Period  Weeks    Status  New      PT LONG TERM GOAL #2   Title  decrease pain 50%    Time  8     Period  Weeks    Status  New      PT LONG TERM GOAL #3   Title  increasre lumbar ROM 50%    Time  8    Period  Weeks    Status  New      PT LONG TERM GOAL #4   Title  increase left knee ROM to WNL's    Time  8    Period  Weeks    Status  New            Plan - 12/25/19 1604    Clinical Impression Statement  Patient tolerated a start to exercise, the exercises need cues as to not do big motions since this was causing pain, also with stretches she needs me to decrease the torque on the knee when we do the piriformis due to knee pain.  Much of the exercises had to be altered to decresae her pain    PT Next Visit Plan  slowly progress    Consulted and Agree with Plan of Care  Patient       Patient will benefit from skilled therapeutic intervention in order to improve the following deficits and impairments:  Pain, Decreased strength, Decreased range of motion, Abnormal gait, Improper body mechanics, Increased muscle spasms, Decreased mobility, Cardiopulmonary status limiting activity, Postural dysfunction, Difficulty walking, Impaired flexibility  Visit Diagnosis: Acute pain of left knee  Stiffness of left knee, not elsewhere classified  Difficulty in walking, not elsewhere classified  Acute bilateral low back pain with left-sided sciatica     Problem List Patient Active Problem List   Diagnosis Date Noted  . Preoperative clearance 02/22/2019  . Colon polyp   . Lower GI bleed 12/18/2015  . Diverticulosis of colon with hemorrhage   . Nausea with vomiting 12/02/2014  . Ileus, postoperative ([place]) 11/04/2014  . Hyponatremia 11/04/2014  . Abdominal fluid collection   . Chest pain 10/26/2014  . Acute calculous cholecystitis 10/26/2014  . Barrett esophagus   . History of GI diverticular bleed   . Essential hypertension   . Dyspnea 01/18/2012  .  Aortic insufficiency 01/18/2012  . ANEMIA, IRON DEFICIENCY 06/04/2010  . HYPERCHOLESTEROLEMIA 02/08/2008  . GERD 02/08/2008   . ARTHRITIS 02/08/2008  . HIATAL HERNIA 11/02/2007  . DIVERTICULOSIS OF COLON 04/07/2004    Sumner Boast., PT 12/25/2019, 4:06 PM  [address] [place], [place], [address] Phone: [phone]   Fax:  [phone]  Name: Terri Shah MRN: [id number] Date of Birth: 1934-05-20

## 2019-12-27 ENCOUNTER — Other Ambulatory Visit: Payer: Self-pay

## 2019-12-27 ENCOUNTER — Encounter: Payer: Self-pay | Admitting: Physical Therapy

## 2019-12-27 ENCOUNTER — Ambulatory Visit: Payer: Medicare HMO | Admitting: Physical Therapy

## 2019-12-27 DIAGNOSIS — M5442 Lumbago with sciatica, left side: Secondary | ICD-10-CM | POA: Diagnosis not present

## 2019-12-27 DIAGNOSIS — M25662 Stiffness of left knee, not elsewhere classified: Secondary | ICD-10-CM | POA: Diagnosis not present

## 2019-12-27 DIAGNOSIS — M25562 Pain in left knee: Secondary | ICD-10-CM

## 2019-12-27 DIAGNOSIS — R262 Difficulty in walking, not elsewhere classified: Secondary | ICD-10-CM | POA: Diagnosis not present

## 2019-12-27 NOTE — Therapy (Signed)
[place], [place], [address] Phone: [phone]   Fax:  [phone]  Physical Therapy Treatment  Patient Details  Name: Terri Shah MRN: [id number] Date of Birth: 1934/09/20 Referring Provider (PT): Nelva Bush   Encounter Date: 12/27/2019  PT End of Session - 12/27/19 1515    Visit Number  3    Date for PT Re-Evaluation  02/18/20    PT Start Time  5929    PT Stop Time  1539    PT Time Calculation (min)  53 min    Activity Tolerance  Patient tolerated treatment well    Behavior During Therapy  [facility] for tasks assessed/performed       Past Medical History:  Diagnosis Date  . Allergy   . Anemia   . Arthritis   . Barrett esophagus   . Barrett's esophagus 11/02/2007   Qualifier: Diagnosis of  By: Mat Carne    . Blood transfusion without reported diagnosis   . Cataract   . Diverticulosis   . ECTOPIC PREGNANCY 02/08/2008   Qualifier: Diagnosis of  By: Mat Carne    . Gastritis   . GASTRITIS 11/02/2007   Qualifier: Diagnosis of  By: Mat Carne    . Glaucoma   . Heart murmur   . Hiatal hernia   . History of GI diverticular bleed   . Hyperlipidemia   . Hypertension   . Iron deficiency 2020  . Iron deficiency anemia   . Osteoporosis   . Palpitations 04/02/2010   Qualifier: Diagnosis of  By: Angelena Form, MD, Harrell Gave    . Presbyesophagus   . Stroke [facility])    "small stroke" 2010- no residual per pt    Past Surgical History:  Procedure Laterality Date  . CATARACT EXTRACTION, BILATERAL    . CESAREAN SECTION  1950-1960s   x 3  . CHOLECYSTECTOMY    . COLONOSCOPY N/A 12/19/2015   Procedure: COLONOSCOPY;  Surgeon: Manus Gunning, MD;  Location: [facility];  Service: Gastroenterology;  Laterality: N/A;  . COLONOSCOPY    . [place]  . FACIAL COSMETIC SURGERY    . LAPAROSCOPIC CHOLECYSTECTOMY SINGLE SITE WITH INTRAOPERATIVE CHOLANGIOGRAM N/A  10/27/2014   Procedure: LAPAROSCOPIC CHOLECYSTECTOMY SINGLE SITE WITH INTRAOPERATIVE CHOLANGIOGRAM;  Surgeon: Dawnette Mione Boston, MD;  Location: [facility];  Service: General;  Laterality: N/A;  . TUBAL LIGATION    . UPPER GASTROINTESTINAL ENDOSCOPY      There were no vitals filed for this visit.  Subjective Assessment - 12/27/19 1451    Subjective  Reports that the back comes and goes and the knees always hurting    Currently in Pain?  Yes    Pain Score  3     Pain Location  Back   knee   Aggravating Factors   just hurt all over                       [facility] Adult PT Treatment/Exercise - 12/27/19 0001      Lumbar Exercises: Stretches   Passive Hamstring Stretch  Right;Left;4 reps;10 seconds    Piriformis Stretch  Right;Left;4 reps;10 seconds    Gastroc Stretch  Right;Left;3 reps;10 seconds      Lumbar Exercises: Aerobic   Recumbent Bike  x 4 minutes    Nustep  level 3 x 6 minutes      Lumbar Exercises: Standing   Other  Standing Lumbar Exercises  HS curls 2.5# 2x10 bilaterally      Lumbar Exercises: Seated   Long Arc Quad on Chair  Both;2 sets;10 reps;Weights    LAQ on Chair Weights (lbs)  2.5    Other Seated Lumbar Exercises  ball b/n knees squeeeze      Lumbar Exercises: Supine   Bridge with [employer]  20 reps;1 second    Bridge with [employer] Limitations  very small bridges as bigger bridges caused an increase of pain    Other Supine Lumbar Exercises  feet on ball K2C, rotation, small bridges and isometric abdominals      Modalities   Modalities  Moist Heat;Electrical Stimulation      Moist Heat Therapy   Number Minutes Moist Heat  12 Minutes    Moist Heat Location  Lumbar Spine      Electrical Stimulation   Electrical Stimulation Location  lumbar area    Electrical Stimulation Action  IFC    Electrical Stimulation Parameters  supine    Electrical Stimulation Goals  Pain               PT Short Term Goals - 12/25/19 1605      PT SHORT TERM  GOAL #1   Title  Ind with intial HEP    Status  Partially Met        PT Long Term Goals - 12/27/19 1517      PT LONG TERM GOAL #1   Title  understnad posture and body mechanics    Status  On-going      PT LONG TERM GOAL #2   Title  decrease pain 50%    Status  On-going            Plan - 12/27/19 1516    Clinical Impression Statement  Patient able to make full revolutions on the bike stopped at 4 minutes due to some pain in the hips and low back area.  She fatigues wasily    PT Next Visit Plan  slowly progress    Consulted and Agree with Plan of Care  Patient       Patient will benefit from skilled therapeutic intervention in order to improve the following deficits and impairments:  Pain, Decreased strength, Decreased range of motion, Abnormal gait, Improper body mechanics, Increased muscle spasms, Decreased mobility, Cardiopulmonary status limiting activity, Postural dysfunction, Difficulty walking, Impaired flexibility  Visit Diagnosis: Acute pain of left knee  Stiffness of left knee, not elsewhere classified  Difficulty in walking, not elsewhere classified  Acute bilateral low back pain with left-sided sciatica     Problem List Patient Active Problem List   Diagnosis Date Noted  . Preoperative clearance 02/22/2019  . Colon polyp   . Lower GI bleed 12/18/2015  . Diverticulosis of colon with hemorrhage   . Nausea with vomiting 12/02/2014  . Ileus, postoperative ([place]) 11/04/2014  . Hyponatremia 11/04/2014  . Abdominal fluid collection   . Chest pain 10/26/2014  . Acute calculous cholecystitis 10/26/2014  . Barrett esophagus   . History of GI diverticular bleed   . Essential hypertension   . Dyspnea 01/18/2012  . Aortic insufficiency 01/18/2012  . ANEMIA, IRON DEFICIENCY 06/04/2010  . HYPERCHOLESTEROLEMIA 02/08/2008  . GERD 02/08/2008  . ARTHRITIS 02/08/2008  . HIATAL HERNIA 11/02/2007  . DIVERTICULOSIS OF COLON 04/07/2004    Terri Shah.,  PT 12/27/2019, 3:21 PM  [place] [phone]  [place], [place], [address] Phone: [phone]   Fax:  [phone]  Name: Terri Shah MRN: [id number] Date of Birth: 06-19-1934

## 2019-12-28 ENCOUNTER — Encounter (HOSPITAL_COMMUNITY): Payer: Medicare HMO

## 2019-12-31 DIAGNOSIS — M25561 Pain in right knee: Secondary | ICD-10-CM | POA: Diagnosis not present

## 2019-12-31 DIAGNOSIS — M25562 Pain in left knee: Secondary | ICD-10-CM | POA: Diagnosis not present

## 2019-12-31 DIAGNOSIS — M170 Bilateral primary osteoarthritis of knee: Secondary | ICD-10-CM | POA: Diagnosis not present

## 2019-12-31 DIAGNOSIS — M1712 Unilateral primary osteoarthritis, left knee: Secondary | ICD-10-CM | POA: Diagnosis not present

## 2019-12-31 DIAGNOSIS — M1711 Unilateral primary osteoarthritis, right knee: Secondary | ICD-10-CM | POA: Diagnosis not present

## 2020-01-03 ENCOUNTER — Ambulatory Visit: Payer: Medicare HMO | Admitting: Physical Therapy

## 2020-01-03 ENCOUNTER — Encounter: Payer: Self-pay | Admitting: Physical Therapy

## 2020-01-03 ENCOUNTER — Other Ambulatory Visit: Payer: Self-pay

## 2020-01-03 DIAGNOSIS — M25562 Pain in left knee: Secondary | ICD-10-CM | POA: Diagnosis not present

## 2020-01-03 DIAGNOSIS — R262 Difficulty in walking, not elsewhere classified: Secondary | ICD-10-CM | POA: Diagnosis not present

## 2020-01-03 DIAGNOSIS — M25662 Stiffness of left knee, not elsewhere classified: Secondary | ICD-10-CM | POA: Diagnosis not present

## 2020-01-03 DIAGNOSIS — M5442 Lumbago with sciatica, left side: Secondary | ICD-10-CM

## 2020-01-03 NOTE — Therapy (Signed)
[place], [place], [address] Phone: [phone]   Fax:  [phone]  Physical Therapy Treatment  Patient Details  Name: Terri Shah MRN: [id number] Date of Birth: 04/01/1934 Referring Provider (PT): Nelva Bush   Encounter Date: 01/03/2020  PT End of Session - 01/03/20 1020    Visit Number  4    Date for PT Re-Evaluation  02/18/20    PT Start Time  0927    PT Stop Time  1027    PT Time Calculation (min)  60 min    Activity Tolerance  Patient tolerated treatment well    Behavior During Therapy  [facility] for tasks assessed/performed       Past Medical History:  Diagnosis Date  . Allergy   . Anemia   . Arthritis   . Barrett esophagus   . Barrett's esophagus 11/02/2007   Qualifier: Diagnosis of  By: Mat Carne    . Blood transfusion without reported diagnosis   . Cataract   . Diverticulosis   . ECTOPIC PREGNANCY 02/08/2008   Qualifier: Diagnosis of  By: Mat Carne    . Gastritis   . GASTRITIS 11/02/2007   Qualifier: Diagnosis of  By: Mat Carne    . Glaucoma   . Heart murmur   . Hiatal hernia   . History of GI diverticular bleed   . Hyperlipidemia   . Hypertension   . Iron deficiency 2020  . Iron deficiency anemia   . Osteoporosis   . Palpitations 04/02/2010   Qualifier: Diagnosis of  By: Angelena Form, MD, Harrell Gave    . Presbyesophagus   . Stroke [facility])    "small stroke" 2010- no residual per pt    Past Surgical History:  Procedure Laterality Date  . CATARACT EXTRACTION, BILATERAL    . CESAREAN SECTION  1950-1960s   x 3  . CHOLECYSTECTOMY    . COLONOSCOPY N/A 12/19/2015   Procedure: COLONOSCOPY;  Surgeon: Manus Gunning, MD;  Location: [facility];  Service: Gastroenterology;  Laterality: N/A;  . COLONOSCOPY    . [place]  . FACIAL COSMETIC SURGERY    . LAPAROSCOPIC CHOLECYSTECTOMY SINGLE SITE WITH INTRAOPERATIVE CHOLANGIOGRAM N/A  10/27/2014   Procedure: LAPAROSCOPIC CHOLECYSTECTOMY SINGLE SITE WITH INTRAOPERATIVE CHOLANGIOGRAM;  Surgeon: Rayssa Atha Boston, MD;  [facility];  Service: General;  Laterality: N/A;  . TUBAL LIGATION    . UPPER GASTROINTESTINAL ENDOSCOPY      There were no vitals filed for this visit.  Subjective Assessment - 01/03/20 0934    Subjective  Patient reports that the back is feeling a little better, reports that she saw the MD Monday about her knee, she is planning to have a TKA this year but unsure when that will be scheduled    Currently in Pain?  Yes    Pain Score  2     Pain Location  Back    Pain Relieving Factors  the treatment is helping                       [facility] Adult PT Treatment/Exercise - 01/03/20 0001      Lumbar Exercises: Stretches   Passive Hamstring Stretch  Right;Left;4 reps;10 seconds    Piriformis Stretch  Right;Left;4 reps;10 seconds    Gastroc Stretch  Right;Left;3 reps;10 seconds      Lumbar Exercises: Aerobic   Recumbent Bike  x 4  minutes    Nustep  level 3 x 6 minutes      Lumbar Exercises: Machines for Strengthening   Cybex Knee Flexion  10# 2x10      Lumbar Exercises: Seated   Long Arc Quad on Chair  Both;2 sets;10 reps;Weights    LAQ on Chair Weights (lbs)  2.5    Other Seated Lumbar Exercises  ball b/n knees squeeeze    Other Seated Lumbar Exercises  2.5# marching 2x10      Lumbar Exercises: Supine   Bridge with Ball Squeeze  20 reps;1 second    Other Supine Lumbar Exercises  feet on ball K2C, rotation, small bridges and isometric abdominals      Modalities   Modalities  Moist Heat;Electrical Stimulation      Moist Heat Therapy   Number Minutes Moist Heat  12 Minutes    Moist Heat Location  Lumbar Spine      Electrical Stimulation   Electrical Stimulation Location  lumbar area    Electrical Stimulation Action  IFC    Electrical Stimulation Parameters  supine    Electrical Stimulation Goals  Pain               PT  Short Term Goals - 01/03/20 1037      PT SHORT TERM GOAL #1   Title  Ind with intial HEP    Status  Achieved        PT Long Term Goals - 12/27/19 1517      PT LONG TERM GOAL #1   Title  understnad posture and body mechanics    Status  On-going      PT LONG TERM GOAL #2   Title  decrease pain 50%    Status  On-going            Plan - 01/03/20 1021    Clinical Impression Statement  Patient reports that she is feeling better, had some questionsabout her balance and being unsteady.  I tried a few balance activities with her and she did okay had issues with eyes closed and on dynamic surfaces    PT Next Visit Plan  slowly progress may add balance    Consulted and Agree with Plan of Care  Patient       Patient will benefit from skilled therapeutic intervention in order to improve the following deficits and impairments:  Pain, Decreased strength, Decreased range of motion, Abnormal gait, Improper body mechanics, Increased muscle spasms, Decreased mobility, Cardiopulmonary status limiting activity, Postural dysfunction, Difficulty walking, Impaired flexibility  Visit Diagnosis: Acute pain of left knee  Acute bilateral low back pain with left-sided sciatica  Stiffness of left knee, not elsewhere classified  Difficulty in walking, not elsewhere classified     Problem List Patient Active Problem List   Diagnosis Date Noted  . Preoperative clearance 02/22/2019  . Colon polyp   . Lower GI bleed 12/18/2015  . Diverticulosis of colon with hemorrhage   . Nausea with vomiting 12/02/2014  . Ileus, postoperative ([place]) 11/04/2014  . Hyponatremia 11/04/2014  . Abdominal fluid collection   . Chest pain 10/26/2014  . Acute calculous cholecystitis 10/26/2014  . Barrett esophagus   . History of GI diverticular bleed   . Essential hypertension   . Dyspnea 01/18/2012  . Aortic insufficiency 01/18/2012  . ANEMIA, IRON DEFICIENCY 06/04/2010  . HYPERCHOLESTEROLEMIA 02/08/2008  .  GERD 02/08/2008  . ARTHRITIS 02/08/2008  . HIATAL HERNIA 11/02/2007  . DIVERTICULOSIS OF COLON 04/07/2004  Sumner Boast., PT 01/03/2020, 10:58 AM  [place], [place], [address] Phone: [phone]   Fax:  [phone]  Name: AMYRA DELAROSA MRN: [id number] Date of Birth: [birth year]

## 2020-01-09 DIAGNOSIS — Z961 Presence of intraocular lens: Secondary | ICD-10-CM | POA: Diagnosis not present

## 2020-01-09 DIAGNOSIS — H18519 Endothelial corneal dystrophy, unspecified eye: Secondary | ICD-10-CM | POA: Diagnosis not present

## 2020-01-09 DIAGNOSIS — T8529XD Other mechanical complication of intraocular lens, subsequent encounter: Secondary | ICD-10-CM | POA: Diagnosis not present

## 2020-01-09 DIAGNOSIS — H4051X1 Glaucoma secondary to other eye disorders, right eye, mild stage: Secondary | ICD-10-CM | POA: Diagnosis not present

## 2020-01-10 ENCOUNTER — Encounter (HOSPITAL_COMMUNITY): Payer: Self-pay

## 2020-01-10 ENCOUNTER — Other Ambulatory Visit: Payer: Self-pay

## 2020-01-10 ENCOUNTER — Ambulatory Visit (HOSPITAL_COMMUNITY)
Admission: RE | Admit: 2020-01-10 | Discharge: 2020-01-10 | Disposition: A | Discharge status: Home / Self Care | Payer: Medicare HMO | Source: Ambulatory Visit | Attending: Internal Medicine | Admitting: Internal Medicine

## 2020-01-10 DIAGNOSIS — M810 Age-related osteoporosis without current pathological fracture: Secondary | ICD-10-CM | POA: Diagnosis not present

## 2020-01-10 MED ORDER — DENOSUMAB 60 MG/ML ~~LOC~~ SOSY
60.0000 mg | PREFILLED_SYRINGE | Freq: Once | SUBCUTANEOUS | Status: AC
  Administered 2020-01-10: 60 mg via SUBCUTANEOUS
  Filled 2020-01-10: qty 1

## 2020-01-10 NOTE — Discharge Instructions (Signed)
Denosumab injection What is this medicine? DENOSUMAB (den oh sue mab) slows bone breakdown. Prolia is used to treat osteoporosis in women after menopause and in men, and in people who are taking corticosteroids for 6 months or more. Xgeva is used to treat a high calcium level due to cancer and to prevent bone fractures and other bone problems caused by multiple myeloma or cancer bone metastases. Xgeva is also used to treat giant cell tumor of the bone. This medicine may be used for other purposes; ask your health care provider or pharmacist if you have questions. COMMON BRAND NAME(S): Prolia, XGEVA What should I tell my health care provider before I take this medicine? They need to know if you have any of these conditions:  dental disease  having surgery or tooth extraction  infection  kidney disease  low levels of calcium or Vitamin D in the blood  malnutrition  on hemodialysis  skin conditions or sensitivity  thyroid or parathyroid disease  an unusual reaction to denosumab, other medicines, foods, dyes, or preservatives  pregnant or trying to get pregnant  breast-feeding How should I use this medicine? This medicine is for injection under the skin. It is given by a health care professional in a hospital or clinic setting. A special MedGuide will be given to you before each treatment. Be sure to read this information carefully each time. For Prolia, talk to your pediatrician regarding the use of this medicine in children. Special care may be needed. For Xgeva, talk to your pediatrician regarding the use of this medicine in children. While this drug may be prescribed for children as young as 13 years for selected conditions, precautions do apply. Overdosage: If you think you have taken too much of this medicine contact a poison control center or emergency room at once. NOTE: This medicine is only for you. Do not share this medicine with others. What if I miss a dose? It is  important not to miss your dose. Call your doctor or health care professional if you are unable to keep an appointment. What may interact with this medicine? Do not take this medicine with any of the following medications:  other medicines containing denosumab This medicine may also interact with the following medications:  medicines that lower your chance of fighting infection  steroid medicines like prednisone or cortisone This list may not describe all possible interactions. Give your health care provider a list of all the medicines, herbs, non-prescription drugs, or dietary supplements you use. Also tell them if you smoke, drink alcohol, or use illegal drugs. Some items may interact with your medicine. What should I watch for while using this medicine? Visit your doctor or health care professional for regular checks on your progress. Your doctor or health care professional may order blood tests and other tests to see how you are doing. Call your doctor or health care professional for advice if you get a fever, chills or sore throat, or other symptoms of a cold or flu. Do not treat yourself. This drug may decrease your body's ability to fight infection. Try to avoid being around people who are sick. You should make sure you get enough calcium and vitamin D while you are taking this medicine, unless your doctor tells you not to. Discuss the foods you eat and the vitamins you take with your health care professional. See your dentist regularly. Brush and floss your teeth as directed. Before you have any dental work done, tell your dentist you are   receiving this medicine. Do not become pregnant while taking this medicine or for 5 months after stopping it. Talk with your doctor or health care professional about your birth control options while taking this medicine. Women should inform their doctor if they wish to become pregnant or think they might be pregnant. There is a potential for serious side  effects to an unborn child. Talk to your health care professional or pharmacist for more information. What side effects may I notice from receiving this medicine? Side effects that you should report to your doctor or health care professional as soon as possible:  allergic reactions like skin rash, itching or hives, swelling of the face, lips, or tongue  bone pain  breathing problems  dizziness  jaw pain, especially after dental work  redness, blistering, peeling of the skin  signs and symptoms of infection like fever or chills; cough; sore throat; pain or trouble passing urine  signs of low calcium like fast heartbeat, muscle cramps or muscle pain; pain, tingling, numbness in the hands or feet; seizures  unusual bleeding or bruising  unusually weak or tired Side effects that usually do not require medical attention (report to your doctor or health care professional if they continue or are bothersome):  constipation  diarrhea  headache  joint pain  loss of appetite  muscle pain  runny nose  tiredness  upset stomach This list may not describe all possible side effects. Call your doctor for medical advice about side effects. You may report side effects to FDA at 1-800-FDA-1088. Where should I keep my medicine? This medicine is only given in a clinic, doctor's office, or other health care setting and will not be stored at home. NOTE: This sheet is a summary. It may not cover all possible information. If you have questions about this medicine, talk to your doctor, pharmacist, or health care provider.  2020 Elsevier/Gold Standard (2018-04-14 16:10:44)

## 2020-01-11 ENCOUNTER — Encounter: Payer: Self-pay | Admitting: Physical Therapy

## 2020-01-11 ENCOUNTER — Ambulatory Visit: Payer: Medicare HMO | Admitting: Physical Therapy

## 2020-01-11 DIAGNOSIS — M25662 Stiffness of left knee, not elsewhere classified: Secondary | ICD-10-CM

## 2020-01-11 DIAGNOSIS — M5442 Lumbago with sciatica, left side: Secondary | ICD-10-CM | POA: Diagnosis not present

## 2020-01-11 DIAGNOSIS — R262 Difficulty in walking, not elsewhere classified: Secondary | ICD-10-CM | POA: Diagnosis not present

## 2020-01-11 DIAGNOSIS — M25562 Pain in left knee: Secondary | ICD-10-CM | POA: Diagnosis not present

## 2020-01-11 NOTE — Therapy (Signed)
[place], [place], [address] Phone: [phone]   Fax:  [phone]  Physical Therapy Treatment  Patient Details  Name: Terri Shah MRN: [id number] Date of Birth: [birth year] Referring Provider (PT): Nelva Bush   Encounter Date: 01/11/2020  PT End of Session - 01/11/20 1018    Visit Number  5    PT Start Time  0930    PT Stop Time  1030    PT Time Calculation (min)  60 min    Activity Tolerance  Patient tolerated treatment well    Behavior During Therapy  [facility] for tasks assessed/performed       Past Medical History:  Diagnosis Date  . Allergy   . Anemia   . Arthritis   . Barrett esophagus   . Barrett's esophagus 11/02/2007   Qualifier: Diagnosis of  By: Mat Carne    . Blood transfusion without reported diagnosis   . Cataract   . Diverticulosis   . ECTOPIC PREGNANCY 02/08/2008   Qualifier: Diagnosis of  By: Mat Carne    . Gastritis   . GASTRITIS 11/02/2007   Qualifier: Diagnosis of  By: Mat Carne    . Glaucoma   . Heart murmur   . Hiatal hernia   . History of GI diverticular bleed   . Hyperlipidemia   . Hypertension   . Iron deficiency 2020  . Iron deficiency anemia   . Osteoporosis   . Palpitations 04/02/2010   Qualifier: Diagnosis of  By: Angelena Form, MD, Harrell Gave    . Presbyesophagus   . Stroke [facility])    "small stroke" 2010- no residual per pt    Past Surgical History:  Procedure Laterality Date  . CATARACT EXTRACTION, BILATERAL    . CESAREAN SECTION  1950-1960s   x 3  . CHOLECYSTECTOMY    . COLONOSCOPY N/A 12/19/2015   Procedure: COLONOSCOPY;  Surgeon: Manus Gunning, MD;  Location: [facility];  Service: Gastroenterology;  Laterality: N/A;  . COLONOSCOPY    . [place]  . FACIAL COSMETIC SURGERY    . LAPAROSCOPIC CHOLECYSTECTOMY SINGLE SITE WITH INTRAOPERATIVE CHOLANGIOGRAM N/A 10/27/2014   Procedure: LAPAROSCOPIC  CHOLECYSTECTOMY SINGLE SITE WITH INTRAOPERATIVE CHOLANGIOGRAM;  Surgeon: Keyana Guevara Boston, MD;  [facility];  Service: General;  Laterality: N/A;  . TUBAL LIGATION    . UPPER GASTROINTESTINAL ENDOSCOPY      There were no vitals filed for this visit.  Subjective Assessment - 01/11/20 0935    Subjective  Patient is doing well, just some pain in the left low back    Currently in Pain?  Yes    Pain Score  3     Pain Location  Back    Pain Orientation  Left;Lower    Pain Relieving Factors  the treatment does help                       [facility] Adult PT Treatment/Exercise - 01/11/20 0001      Lumbar Exercises: Stretches   Passive Hamstring Stretch  Right;Left;4 reps;10 seconds    Piriformis Stretch  Right;Left;4 reps;10 seconds    Gastroc Stretch  Right;Left;3 reps;10 seconds      Lumbar Exercises: Aerobic   Recumbent Bike  x 4 minutes    Nustep  level 4 x 6 minutes      Lumbar Exercises: Standing   Other Standing Lumbar Exercises  HS curls 2.5# 2x10 bilaterally    Other Standing Lumbar Exercises  red taband scapular rows and extension, hip extension and abduction      Lumbar Exercises: Seated   Long Arc Quad on Chair  Both;2 sets;10 reps;Weights    LAQ on Chair Weights (lbs)  2.5    Other Seated Lumbar Exercises  ball b/n knees squeeeze      Lumbar Exercises: Supine   Bridge with Ball Squeeze  20 reps;1 second    Other Supine Lumbar Exercises  feet on ball K2C, rotation, small bridges and isometric abdominals      Modalities   Modalities  Moist Heat;Electrical Stimulation      Moist Heat Therapy   Number Minutes Moist Heat  12 Minutes    Moist Heat Location  Lumbar Spine      Electrical Stimulation   Electrical Stimulation Location  lumbar area    Electrical Stimulation Action  IFC    Electrical Stimulation Parameters  supine    Electrical Stimulation Goals  Pain               PT Short Term Goals - 01/03/20 1037      PT SHORT TERM GOAL #1    Title  Ind with intial HEP    Status  Achieved        PT Long Term Goals - 01/11/20 1038      PT LONG TERM GOAL #1   Title  understnad posture and body mechanics    Status  On-going      PT LONG TERM GOAL #2   Title  decrease pain 50%    Status  On-going            Plan - 01/11/20 1030    Clinical Impression Statement  Patient reports that she is feeling a little better and a little stronger, she still has back pain and knee pain. She reports difficulty standing for any length of time.  Very weak in the core and the hips    PT Next Visit Plan  slowly progress may add balance    Consulted and Agree with Plan of Care  Patient       Patient will benefit from skilled therapeutic intervention in order to improve the following deficits and impairments:  Pain, Decreased strength, Decreased range of motion, Abnormal gait, Improper body mechanics, Increased muscle spasms, Decreased mobility, Cardiopulmonary status limiting activity, Postural dysfunction, Difficulty walking, Impaired flexibility  Visit Diagnosis: Acute bilateral low back pain with left-sided sciatica  Acute pain of left knee  Stiffness of left knee, not elsewhere classified  Difficulty in walking, not elsewhere classified     Problem List Patient Active Problem List   Diagnosis Date Noted  . Preoperative clearance 02/22/2019  . Colon polyp   . Lower GI bleed 12/18/2015  . Diverticulosis of colon with hemorrhage   . Nausea with vomiting 12/02/2014  . Ileus, postoperative ([place]) 11/04/2014  . Hyponatremia 11/04/2014  . Abdominal fluid collection   . Chest pain 10/26/2014  . Acute calculous cholecystitis 10/26/2014  . Barrett esophagus   . History of GI diverticular bleed   . Essential hypertension   . Dyspnea 01/18/2012  . Aortic insufficiency 01/18/2012  . ANEMIA, IRON DEFICIENCY 06/04/2010  . HYPERCHOLESTEROLEMIA 02/08/2008  . GERD 02/08/2008  . ARTHRITIS 02/08/2008  . HIATAL HERNIA 11/02/2007   . DIVERTICULOSIS OF COLON 04/07/2004    Sumner Boast., PT 01/11/2020, 10:38 AM  [place], [place], [address] Phone: [phone]   Fax:  [phone]  Name: Terri Shah MRN: [id number] Date of Birth: 1934/10/16

## 2020-01-15 DIAGNOSIS — Z0100 Encounter for examination of eyes and vision without abnormal findings: Secondary | ICD-10-CM | POA: Diagnosis not present

## 2020-01-17 ENCOUNTER — Ambulatory Visit: Payer: Medicare HMO | Admitting: Physical Therapy

## 2020-01-17 ENCOUNTER — Other Ambulatory Visit: Payer: Self-pay

## 2020-01-17 ENCOUNTER — Telehealth: Payer: Self-pay | Admitting: *Deleted

## 2020-01-17 DIAGNOSIS — M25562 Pain in left knee: Secondary | ICD-10-CM | POA: Diagnosis not present

## 2020-01-17 DIAGNOSIS — R262 Difficulty in walking, not elsewhere classified: Secondary | ICD-10-CM

## 2020-01-17 DIAGNOSIS — M25662 Stiffness of left knee, not elsewhere classified: Secondary | ICD-10-CM

## 2020-01-17 DIAGNOSIS — M5442 Lumbago with sciatica, left side: Secondary | ICD-10-CM

## 2020-01-17 NOTE — Telephone Encounter (Signed)
   Primary Cardiologist:Jonathan Gwenlyn Found, MD  Chart reviewed as part of pre-operative protocol coverage. Because of Terri Shah's past medical history and time since last visit, he/she will require a follow-up visit in order to better assess preoperative cardiovascular risk.  Pre-op covering staff: - Please schedule appointment and call patient to inform them. - Please contact requesting surgeon's office via preferred method (i.e, phone, fax) to inform them of need for appointment prior to surgery.   [place], [place]  01/17/2020, 3:32 PM

## 2020-01-17 NOTE — Therapy (Signed)
[place], [place], [address] Phone: [phone]   Fax:  [phone]  Physical Therapy Treatment  Patient Details  Name: Terri Shah MRN: [id number] Date of Birth: [birth year] Referring Provider (PT): Nelva Bush   Encounter Date: 01/17/2020  PT End of Session - 01/17/20 1029    Visit Number  6    Date for PT Re-Evaluation  02/18/20    PT Start Time  [id number]    PT Stop Time  1110    PT Time Calculation (min)  55 min       Past Medical History:  Diagnosis Date  . Allergy   . Anemia   . Arthritis   . Barrett esophagus   . Barrett's esophagus 11/02/2007   Qualifier: Diagnosis of  By: Mat Carne    . Blood transfusion without reported diagnosis   . Cataract   . Diverticulosis   . ECTOPIC PREGNANCY 02/08/2008   Qualifier: Diagnosis of  By: Mat Carne    . Gastritis   . GASTRITIS 11/02/2007   Qualifier: Diagnosis of  By: Mat Carne    . Glaucoma   . Heart murmur   . Hiatal hernia   . History of GI diverticular bleed   . Hyperlipidemia   . Hypertension   . Iron deficiency 2020  . Iron deficiency anemia   . Osteoporosis   . Palpitations 04/02/2010   Qualifier: Diagnosis of  By: Angelena Form, MD, Harrell Gave    . Presbyesophagus   . Stroke [facility])    "small stroke" 2010- no residual per pt    Past Surgical History:  Procedure Laterality Date  . CATARACT EXTRACTION, BILATERAL    . CESAREAN SECTION  1950-1960s   x 3  . CHOLECYSTECTOMY    . COLONOSCOPY N/A 12/19/2015   Procedure: COLONOSCOPY;  Surgeon: Manus Gunning, MD;  Location: [facility];  Service: Gastroenterology;  Laterality: N/A;  . COLONOSCOPY    . [place]  . FACIAL COSMETIC SURGERY    . LAPAROSCOPIC CHOLECYSTECTOMY SINGLE SITE WITH INTRAOPERATIVE CHOLANGIOGRAM N/A 10/27/2014   Procedure: LAPAROSCOPIC CHOLECYSTECTOMY SINGLE SITE WITH INTRAOPERATIVE CHOLANGIOGRAM;  Surgeon: Michael Boston, MD;  Location[facility];  Service: General;  Laterality: N/A;  . TUBAL LIGATION    . UPPER GASTROINTESTINAL ENDOSCOPY      There were no vitals filed for this visit.  Subjective Assessment - 01/17/20 1014    Subjective  doing okay, knee and back hurt alittle. did fine after last session ,estim/MH helps    Currently in Pain?  Yes    Pain Score  2     Pain Location  Back                       [facility] Adult PT Treatment/Exercise - 01/17/20 0001      Lumbar Exercises: Stretches   Passive Hamstring Stretch  Right;Left;4 reps;10 seconds    Piriformis Stretch  Right;Left;4 reps;10 seconds    Gastroc Stretch  Right;Left;3 reps;10 seconds      Lumbar Exercises: Aerobic   Recumbent Bike  x 4 minutes    Nustep  level 4 x 6 minutes      Lumbar Exercises: Standing   Other Standing Lumbar Exercises  3# hip ext, abd and HS curl 2 sets 10 BIL    Other Standing Lumbar Exercises  red tband scap ext, row , abd and ER  10 each      Lumbar Exercises: Seated   Long Arc Quad on Chair  Strengthening;Both;2 sets;10 reps;Weights    LAQ on Chair Weights (lbs)  3    Other Seated Lumbar Exercises  ball b/n knees squeeeze 15      Lumbar Exercises: Supine   Other Supine Lumbar Exercises  isometric abdominals with ball 15 times    Other Supine Lumbar Exercises  feet on ball small bridge, KTC, obl  15 each      Modalities   Modalities  Moist Heat;Electrical Stimulation      Moist Heat Therapy   Number Minutes Moist Heat  15 Minutes    Moist Heat Location  Lumbar Spine      Electrical Stimulation   Electrical Stimulation Location  lumbar area    Electrical Stimulation Action  IFC    Electrical Stimulation Parameters  supine    Electrical Stimulation Goals  Pain               PT Short Term Goals - 01/03/20 1037      PT SHORT TERM GOAL #1   Title  Ind with intial HEP    Status  Achieved        PT Long Term Goals - 01/11/20 1038      PT LONG TERM GOAL #1   Title   understnad posture and body mechanics    Status  On-going      PT LONG TERM GOAL #2   Title  decrease pain 50%    Status  On-going            Plan - 01/17/20 1041    Clinical Impression Statement  increased weight from 2.5 to 3# as well as added reps to ex today and pt tolerated well. pt verb left leg weakness vs right and some back soreness with standing ex. progresssing hip and core strength    PT Treatment/Interventions  ADLs/Self Care Home Management;Cryotherapy;Electrical Stimulation;Moist Heat;Ultrasound;Therapeutic activities;Therapeutic exercise;Neuromuscular re-education;Manual techniques;Patient/family education    PT Next Visit Plan  slowly progress may add balance       Patient will benefit from skilled therapeutic intervention in order to improve the following deficits and impairments:  Pain, Decreased strength, Decreased range of motion, Abnormal gait, Improper body mechanics, Increased muscle spasms, Decreased mobility, Cardiopulmonary status limiting activity, Postural dysfunction, Difficulty walking, Impaired flexibility  Visit Diagnosis: Acute bilateral low back pain with left-sided sciatica  Acute pain of left knee  Stiffness of left knee, not elsewhere classified  Difficulty in walking, not elsewhere classified     Problem List Patient Active Problem List   Diagnosis Date Noted  . Preoperative clearance 02/22/2019  . Colon polyp   . Lower GI bleed 12/18/2015  . Diverticulosis of colon with hemorrhage   . Nausea with vomiting 12/02/2014  . Ileus, postoperative ([place]) 11/04/2014  . Hyponatremia 11/04/2014  . Abdominal fluid collection   . Chest pain 10/26/2014  . Acute calculous cholecystitis 10/26/2014  . Barrett esophagus   . History of GI diverticular bleed   . Essential hypertension   . Dyspnea 01/18/2012  . Aortic insufficiency 01/18/2012  . ANEMIA, IRON DEFICIENCY 06/04/2010  . HYPERCHOLESTEROLEMIA 02/08/2008  . GERD 02/08/2008  .  ARTHRITIS 02/08/2008  . HIATAL HERNIA 11/02/2007  . DIVERTICULOSIS OF COLON 04/07/2004    Zera Markwardt,ANGIE PTA 01/17/2020, 10:43 AM  [place] [address] [place], [place], [address] Phone: [phone]   Fax:  [phone]  Name: CURTINA ATLAS MRN: [id number] Date of Birth: 24-Feb-1934

## 2020-01-17 NOTE — Telephone Encounter (Signed)
Cleared at low risk for her ortho procedure

## 2020-01-17 NOTE — Telephone Encounter (Signed)
Pt has been scheduled to see Dr. Gwenlyn Found 02/12/20 @ 2:15 for pre op clearance. Pt thanked me for the call. I will send clearance note to Dr. Gwenlyn Found for upcoming appt. Will send FYI to the surgeon Dr. Paralee Cancel pt has appt 02/12/20. Will remove from the pre op call back pool.

## 2020-01-17 NOTE — Telephone Encounter (Signed)
   [facility] Pre-operative Risk Assessment    Request for surgical clearance:  1. What type of surgery is being performed? TOTAL KNEE ARTHROPLASTY   2. When is this surgery scheduled?  03/11/20   3. What type of clearance is required (medical clearance vs. Pharmacy clearance to hold med vs. Both)? MEDICAL  4. Are there any medications that need to be held prior to surgery and how long? NONE   5. Practice name and name of physician performing surgery?  [facility]THE[facility]   6. What is your office phone number [phone]    7.   What is your office fax number [phone]  8.   Anesthesia type (None, local, MAC, general) ? SPINAL   Devra Dopp 01/17/2020, 3:26 PM  _________________________________________________________________   (provider comments below)

## 2020-01-23 ENCOUNTER — Ambulatory Visit: Payer: Medicare HMO | Admitting: Gastroenterology

## 2020-01-23 ENCOUNTER — Ambulatory Visit: Payer: Medicare HMO | Attending: Physical Medicine and Rehabilitation | Admitting: Physical Therapy

## 2020-01-23 ENCOUNTER — Other Ambulatory Visit: Payer: Self-pay

## 2020-01-23 ENCOUNTER — Encounter: Payer: Self-pay | Admitting: Physical Therapy

## 2020-01-23 ENCOUNTER — Encounter: Payer: Self-pay | Admitting: Gastroenterology

## 2020-01-23 VITALS — BP 142/70 | HR 67 | Temp 96.9°F | Ht 61.0 in | Wt 128.0 lb

## 2020-01-23 DIAGNOSIS — M25662 Stiffness of left knee, not elsewhere classified: Secondary | ICD-10-CM | POA: Insufficient documentation

## 2020-01-23 DIAGNOSIS — D509 Iron deficiency anemia, unspecified: Secondary | ICD-10-CM | POA: Diagnosis not present

## 2020-01-23 DIAGNOSIS — M5442 Lumbago with sciatica, left side: Secondary | ICD-10-CM | POA: Insufficient documentation

## 2020-01-23 DIAGNOSIS — R262 Difficulty in walking, not elsewhere classified: Secondary | ICD-10-CM | POA: Diagnosis not present

## 2020-01-23 DIAGNOSIS — M25562 Pain in left knee: Secondary | ICD-10-CM | POA: Insufficient documentation

## 2020-01-23 DIAGNOSIS — Z86010 Personal history of colonic polyps: Secondary | ICD-10-CM | POA: Diagnosis not present

## 2020-01-23 NOTE — Progress Notes (Signed)
86 year old female here for follow-up visit.  She had a history of chronic iron deficiency anemia previously evaluated in 2011.  She has had history of lower GI bleeds in the past thought to be due to diverticulosis.  This past March 2020 she had recurrence of iron deficiency.  Her last colonoscopy was in December 2016.  She has severe diverticulosis with angulated turns, she had a few small polyps removed that were consistent with adenomas.  Patient at the last visit had declined colonoscopy given her relatively recent exam and no high risk lesions.  She previously had an abnormal capsule endoscopy in 2011 showing a suggested submucosal bulge in the mid small bowel.  She was agreeable to a capsule in July to reevaluate this area.  Unfortunately the prep was poor in the study was somewhat limited but nothing obvious was noted.  She did have some prominent folds in the stomach and patient proceeded with a EGD in August 2020.  Remarkable findings included a 2 cm hiatal hernia, mild gastritis and 2 diverticulum in the duodenum.  Otherwise exam was normal.  Biopsies negative for H. pylori and gastric intestinal metaplasia.  The patient has been taking Prilosec 20 mg once a day.  She since then has been taking oral iron supplementation every other day.  If she takes it every day she has upset stomach and some constipation.  Last year her hemoglobin nadired at 8.1 with an MCV of 80, with a ferritin of 11.  She did have overt rectal bleeding in March 2020 thought to be related to bleeding from diverticulosis which she has had in the past.  She has not had any bleeding symptoms since then.  Denies any blood in the stools.  No abdominal pain.  She in general feels very well at this time.  She denies any NSAID use.  She is going to be having a knee replacement in the next few months.  She states her last labs were done by Dr. Virgina Jock in December.  Labs: 12/18/19 - Hgb 12.4, MCV 95.3, plt 355, WBC 6.83  Capsule  07/11/19 - poor prep of small bowel, limited exam, unclear if capsule got caught in diverticulum  EGD 08/09/19 -  Findings: - A 2 cm hiatal hernia was present. - The exam of the esophagus was otherwise normal. - Patchy mildly erythematous mucosa was found in the gastric antrum without focal ulceration. - The exam of the stomach was otherwise normal. - Biopsies were taken with a cold forceps in the gastric body, at the incisura and in the gastric antrum for Helicobacter pylori testing. - Two diverticulum were found in the duodenum - one in the sweep and another one in the mid second portion. - The exam of the duodenum was otherwise normal.  Biopsies negative for HP  Colonoscopy 12/19/2015 - There was pancolonic diverticulosis noted. Severe in the left colon with multiple angulated turns, making for a prolonged cecal intubation. Moderate diverticulosis noted in the right colon. No red blood or old blood appreciated. There was a 65mm sessile cecal polyp noted that was removed via cold snare. Another 5-60mm sessile polyp was noted along the back side of the IC valve removed with cold snare, and another 80mm sessile polyp along the back side of the IC valve removed with cold forceps. The terminal ileum was intubated and normal without blood. None of the diverticula had stigmata of recent bleeding and time was taken to inspect them all. Following retroflexion a small mucosal  wrent was noted in the distal rectum just proximal to the dentate line with some small oozing which resolved with observation. Retroflexed views revealed no abnormalities. The time to cecum = 6.7 Withdrawal time = 35.4 The scope was withdrawn and the procedure completed.     Past Medical History:  Diagnosis Date  . Allergy   . Anemia   . Arthritis   . Barrett esophagus   . Barrett's esophagus 11/02/2007   Qualifier: Diagnosis of  By: Mat Carne    . Blood transfusion without reported diagnosis   . Cataract   .  Diverticulosis   . ECTOPIC PREGNANCY 02/08/2008   Qualifier: Diagnosis of  By: Mat Carne    . Gastritis   . GASTRITIS 11/02/2007   Qualifier: Diagnosis of  By: Mat Carne    . Glaucoma   . Heart murmur   . Hiatal hernia   . History of GI diverticular bleed   . Hyperlipidemia   . Hypertension   . Iron deficiency 2020  . Iron deficiency anemia   . Osteoporosis   . Palpitations 04/02/2010   Qualifier: Diagnosis of  By: Angelena Form, MD, Harrell Gave    . Presbyesophagus   . Stroke [facility])    "small stroke" 2010- no residual per pt     Past Surgical History:  Procedure Laterality Date  . CATARACT EXTRACTION, BILATERAL    . CESAREAN SECTION  1950-1960s   x 3  . CHOLECYSTECTOMY    . COLONOSCOPY N/A 12/19/2015   Procedure: COLONOSCOPY;  Surgeon: Manus Gunning, MD;  Location: [facility];  Service: Gastroenterology;  Laterality: N/A;  . COLONOSCOPY    . [place]  . FACIAL COSMETIC SURGERY    . LAPAROSCOPIC CHOLECYSTECTOMY SINGLE SITE WITH INTRAOPERATIVE CHOLANGIOGRAM N/A 10/27/2014   Procedure: LAPAROSCOPIC CHOLECYSTECTOMY SINGLE SITE WITH INTRAOPERATIVE CHOLANGIOGRAM;  Surgeon: Michael Boston, MD;  Location[facility];  Service: General;  Laterality: N/A;  . TUBAL LIGATION    . UPPER GASTROINTESTINAL ENDOSCOPY     Family History  Problem Relation Age of Onset  . Heart disease Mother   . Stroke Mother   . Heart disease Father   . Breast cancer Sister   . Diabetes Sister   . Kidney disease Sister   . Colon cancer Neg Hx   . Esophageal cancer Neg Hx   . Rectal cancer Neg Hx   . Stomach cancer Neg Hx    Social History   Tobacco Use  . Smoking status: Never Smoker  . Smokeless tobacco: Never Used  Substance Use Topics  . Alcohol use: Yes    Comment: occasional  . Drug use: No   Current Outpatient Medications  Medication Sig Dispense Refill  . acetaminophen (TYLENOL) 500 MG tablet Take 500 mg by mouth every 6 (six) hours as needed.  Patient used this medication for her pain.    Marland Kitchen amitriptyline (ELAVIL) 25 MG tablet Take 25 mg by mouth at bedtime as needed for sleep.     Marland Kitchen atorvastatin (LIPITOR) 20 MG tablet Take 20 mg by mouth daily.      . benazepril (LOTENSIN) 40 MG tablet Take 20 mg by mouth daily. Takes 1/2 tablet    . calcium carbonate (TUMS EX) 750 MG chewable tablet Chew 1 tablet by mouth daily as needed for heartburn.    . denosumab (PROLIA) 60 MG/ML SOLN injection Inject 60 mg into the skin every 6 (six) months. Administer in upper arm, thigh,  or abdomen    . dorzolamide-timolol (COSOPT) 22.3-6.8 MG/ML ophthalmic solution Place 1 drop into the right eye 2 (two) times daily.     . Ferrous Sulfate (SLOW FE) 142 (45 Fe) MG TBCR Take 1 tablet by mouth daily.     . hydrochlorothiazide (HYDRODIURIL) 25 MG tablet Take 25 mg by mouth every other day.     . latanoprost (XALATAN) 0.005 % ophthalmic solution Place 1 drop into the right eye daily.    Marland Kitchen loratadine (CLARITIN) 10 MG tablet Take 10 mg by mouth daily.    . Magnesium 250 MG TABS Take 1 tablet by mouth every other day.    . Menthol, Topical Analgesic, (BIOFREEZE ROLL-ON EX) Apply topically as needed.    Marland Kitchen omeprazole (PRILOSEC) 20 MG capsule Take 20 mg by mouth daily.     . polyvinyl alcohol (LIQUIFILM TEARS) 1.4 % ophthalmic solution Place 1 drop into both eyes daily as needed for dry eyes.    . prednisoLONE acetate (PRED FORTE) 1 % ophthalmic suspension Place 1 drop into the right eye every other day.     . Psyllium (METAMUCIL PO) Take 1 each by mouth daily. 1 heaping teaspoon daily    . Saline 2.65 % SOLN Place 1 each into the nose daily as needed (congestion).    . traMADol (ULTRAM) 50 MG tablet Take 50 mg by mouth daily as needed for moderate pain. Patient states she can have 1-2 tablets a day    . vitamin B-12 (CYANOCOBALAMIN) 1000 MCG tablet Take 1,000 mcg by mouth daily.    . Vitamin D, Cholecalciferol, 50 MCG (2000 UT) CAPS Take 1 capsule by mouth daily.      No current facility-administered medications for this visit.   Allergies  Allergen Reactions  . Codeine Other (See Comments)    "Perception off"      Review of Systems: All systems reviewed and negative except where noted in HPI.   Lab Results  Component Value Date   WBC 7.1 09/27/2019   HGB 11.4 (L) 09/27/2019   HCT 34.9 (L) 09/27/2019   MCV 90.4 09/27/2019   PLT 323.0 09/27/2019    Lab Results  Component Value Date   CREATININE 0.49 12/20/2015   BUN <5 (L) 12/20/2015   NA 138 12/20/2015   K 3.7 12/20/2015   CL 107 12/20/2015   CO2 24 12/20/2015    Lab Results  Component Value Date   ALT 13 (L) 12/18/2015   AST 17 12/18/2015   ALKPHOS 34 (L) 12/18/2015   BILITOT 0.8 12/18/2015      Physical Exam: BP (!) 142/70   Pulse 67   Temp (!) 96.9 F (36.1 C)   Ht 5\' 1"  (1.549 m)   Wt 128 lb (58.1 kg)   BMI 24.19 kg/m  Constitutional: Pleasant,well-developed, female in no acute distress. Neurological: Alert and oriented to person place and time. Skin: Skin is warm and dry. No rashes noted. Psychiatric: Normal mood and affect. Behavior is normal.   ASSESSMENT AND PLAN: 86 year old female here for reassessment of the following:  Iron deficiency anemia / History of colon polyps - longstanding intermittent iron deficiency anemia.  She had this evaluated in 2011 without clear cause, colonoscopy again in 2016 showed a few small polyps but no high risk pathology.  Recent capsule endoscopy and EGD as outlined above.  She fortunately has responded to oral iron therapy every other day, and is on a low-dose PPI for history of gastritis.  Her hemoglobin  has since normalized as of the very end of December as above.  She is doing well.  The patient does not wish to have any further colonoscopy exams after discussion of her prior exams with her, as she would normally be due for surveillance exam around now for history of small polyps.  Given her age and that her last exam was  difficult technically due to severe diverticulosis, I think this is reasonable.  I would continue to trend her hemoglobin and repeat CBC again in 3 to 4 months.  If for some reason she has recurrent anemia or fails to respond to oral iron she would be willing to consider another colonoscopy, however if her blood counts remained stable she wants to avoid this moving forward.  She has been anxious about having her knee replaced and may need anticoagulation for a very short period of time.  I counseled her that she does have a history of diverticulosis related bleeding, and recurrence is possible, however if she is on anticoagulation for a short period of time around her operation hopefully the risk of bleeding is low, and I think the benefits of avoiding a blood clot outweigh the potential risks in this scenario for short-term use.  She agreed with the plan he can follow-up as needed with any other questions.  We will plan on repeating her labs in 3 to 4 months.  Emmet Cellar, MD [facility]  CC: Shon Baton, MD

## 2020-01-23 NOTE — Therapy (Signed)
[place] [address], [place], [address] Phone: [phone]   Fax:  [phone]  Physical Therapy Treatment  Patient Details  Name: Terri Shah MRN: [id number] Date of Birth: 09-12-1934 Referring Provider (PT): Nelva Bush   Encounter Date: 01/23/2020  PT End of Session - 01/23/20 1012    Visit Number  7    Date for PT Re-Evaluation  02/18/20    PT Start Time  0929    PT Stop Time  1022    PT Time Calculation (min)  53 min    Activity Tolerance  Patient tolerated treatment well    Behavior During Therapy  [facility] for tasks assessed/performed       Past Medical History:  Diagnosis Date  . Allergy   . Anemia   . Arthritis   . Barrett esophagus   . Barrett's esophagus 11/02/2007   Qualifier: Diagnosis of  By: Mat Carne    . Blood transfusion without reported diagnosis   . Cataract   . Diverticulosis   . ECTOPIC PREGNANCY 02/08/2008   Qualifier: Diagnosis of  By: Mat Carne    . Gastritis   . GASTRITIS 11/02/2007   Qualifier: Diagnosis of  By: Mat Carne    . Glaucoma   . Heart murmur   . Hiatal hernia   . History of GI diverticular bleed   . Hyperlipidemia   . Hypertension   . Iron deficiency 2020  . Iron deficiency anemia   . Osteoporosis   . Palpitations 04/02/2010   Qualifier: Diagnosis of  By: Angelena Form, MD, Harrell Gave    . Presbyesophagus   . Stroke [facility])    "small stroke" 2010- no residual per pt    Past Surgical History:  Procedure Laterality Date  . CATARACT EXTRACTION, BILATERAL    . CESAREAN SECTION  1950-1960s   x 3  . CHOLECYSTECTOMY    . COLONOSCOPY N/A 12/19/2015   Procedure: COLONOSCOPY;  Surgeon: Manus Gunning, MD;  Location: [facility];  Service: Gastroenterology;  Laterality: N/A;  . COLONOSCOPY    . [place]  . FACIAL COSMETIC SURGERY    . LAPAROSCOPIC CHOLECYSTECTOMY SINGLE SITE WITH INTRAOPERATIVE CHOLANGIOGRAM N/A  10/27/2014   Procedure: LAPAROSCOPIC CHOLECYSTECTOMY SINGLE SITE WITH INTRAOPERATIVE CHOLANGIOGRAM;  Surgeon: Boomer Winders Boston, MD;  [facility];  Service: General;  Laterality: N/A;  . TUBAL LIGATION    . UPPER GASTROINTESTINAL ENDOSCOPY      There were no vitals filed for this visit.  Subjective Assessment - 01/23/20 0930    Subjective  [name] reports that she may have scheduled her TKA for March 23, she has a lot of questions about this, the after care, home PT, SNF or OPRehab.  Driving, walking etc...    Currently in Pain?  Yes    Pain Score  3     Pain Location  Back    Pain Orientation  Left;Lower    Aggravating Factors   cold weather                       [facility] Adult PT Treatment/Exercise - 01/23/20 0001      Lumbar Exercises: Aerobic   Recumbent Bike  x 4 minutes    Nustep  level 4 x 6 minutes      Lumbar Exercises: Machines for Strengthening   Cybex Knee Flexion  10# 2x10  Lumbar Exercises: Standing   Other Standing Lumbar Exercises  3# hip ext, abd and HS curl 2 sets 10 BIL    Other Standing Lumbar Exercises  red tband scap ext, row , abd and ER 10 each      Lumbar Exercises: Seated   Long Arc Quad on Chair  Strengthening;Both;2 sets;10 reps;Weights    LAQ on Chair Weights (lbs)  3      Lumbar Exercises: Supine   Bridge with [employer]  20 reps;1 second    Bridge with [employer] Limitations  very small bridges as bigger bridges caused an increase of pain    Other Supine Lumbar Exercises  isometric abdominals with ball 15 times    Other Supine Lumbar Exercises  feet on ball small bridge, KTC, obl  15 each      Moist Heat Therapy   Number Minutes Moist Heat  12 Minutes    Moist Heat Location  Lumbar Spine      Electrical Stimulation   Electrical Stimulation Location  lumbar area    Electrical Stimulation Action  IFC    Electrical Stimulation Parameters  supine    Electrical Stimulation Goals  Pain               PT Short Term  Goals - 01/03/20 1037      PT SHORT TERM GOAL #1   Title  Ind with intial HEP    Status  Achieved        PT Long Term Goals - 01/23/20 1013      PT LONG TERM GOAL #1   Title  understnad posture and body mechanics    Status  Partially Met      PT LONG TERM GOAL #2   Title  decrease pain 50%    Status  Partially Met            Plan - 01/23/20 1012    Clinical Impression Statement  Patient with a lot of questions about TKA and the protocols and what to expect, I tried to answer her quesitons about this, she is planning on March 23, has worries about coming home the next day and not having home PT or no SNF saty.    PT Next Visit Plan  slowly progress may add balance    Consulted and Agree with Plan of Care  Patient       Patient will benefit from skilled therapeutic intervention in order to improve the following deficits and impairments:  Pain, Decreased strength, Decreased range of motion, Abnormal gait, Improper body mechanics, Increased muscle spasms, Decreased mobility, Cardiopulmonary status limiting activity, Postural dysfunction, Difficulty walking, Impaired flexibility  Visit Diagnosis: Acute bilateral low back pain with left-sided sciatica  Acute pain of left knee     Problem List Patient Active Problem List   Diagnosis Date Noted  . Preoperative clearance 02/22/2019  . Colon polyp   . Lower GI bleed 12/18/2015  . Diverticulosis of colon with hemorrhage   . Nausea with vomiting 12/02/2014  . Ileus, postoperative ([place]) 11/04/2014  . Hyponatremia 11/04/2014  . Abdominal fluid collection   . Chest pain 10/26/2014  . Acute calculous cholecystitis 10/26/2014  . Barrett esophagus   . History of GI diverticular bleed   . Essential hypertension   . Dyspnea 01/18/2012  . Aortic insufficiency 01/18/2012  . ANEMIA, IRON DEFICIENCY 06/04/2010  . HYPERCHOLESTEROLEMIA 02/08/2008  . GERD 02/08/2008  . ARTHRITIS 02/08/2008  . HIATAL HERNIA 11/02/2007  .  DIVERTICULOSIS  OF COLON 04/07/2004    Sumner Boast., PT 01/23/2020, 10:14 AM  [place], [place], [address] Phone: [phone]   Fax:  [phone]  Name: NUSAYBAH IVIE MRN: [id number] Date of Birth: 1934/03/10

## 2020-01-23 NOTE — Patient Instructions (Addendum)
If you are age 84 or older, your body mass index should be between 23-30. Your Body mass index is 24.19 kg/m. If this is out of the aforementioned range listed, please consider follow up with your Primary Care Provider.  If you are age 56 or younger, your body mass index should be between 19-25. Your Body mass index is 24.19 kg/m. If this is out of the aformentioned range listed, please consider follow up with your Primary Care Provider.    Thank you for entrusting me with your care and for choosing [facility], Dr. Van Cellar

## 2020-01-30 ENCOUNTER — Other Ambulatory Visit: Payer: Self-pay

## 2020-01-30 ENCOUNTER — Encounter: Payer: Self-pay | Admitting: Physical Therapy

## 2020-01-30 ENCOUNTER — Ambulatory Visit: Payer: Medicare HMO | Admitting: Physical Therapy

## 2020-01-30 DIAGNOSIS — M25562 Pain in left knee: Secondary | ICD-10-CM | POA: Diagnosis not present

## 2020-01-30 DIAGNOSIS — R262 Difficulty in walking, not elsewhere classified: Secondary | ICD-10-CM

## 2020-01-30 DIAGNOSIS — M5442 Lumbago with sciatica, left side: Secondary | ICD-10-CM

## 2020-01-30 DIAGNOSIS — M25662 Stiffness of left knee, not elsewhere classified: Secondary | ICD-10-CM

## 2020-01-30 NOTE — Therapy (Signed)
[place], [place], [address] Phone: [phone]   Fax:  [phone]  Physical Therapy Treatment  Patient Details  Name: Terri Shah MRN: [id number] Date of Birth: [birth year] Referring Provider (PT): Nelva Bush   Encounter Date: 01/30/2020  PT End of Session - 01/30/20 1040    Visit Number  8    Date for PT Re-Evaluation  02/18/20    PT Start Time  3664    PT Stop Time  1101    PT Time Calculation (min)  46 min    Activity Tolerance  Patient tolerated treatment well    Behavior During Therapy  [facility] for tasks assessed/performed       Past Medical History:  Diagnosis Date  . Allergy   . Anemia   . Arthritis   . Barrett esophagus   . Barrett's esophagus 11/02/2007   Qualifier: Diagnosis of  By: Mat Carne    . Blood transfusion without reported diagnosis   . Cataract   . Diverticulosis   . ECTOPIC PREGNANCY 02/08/2008   Qualifier: Diagnosis of  By: Mat Carne    . Gastritis   . GASTRITIS 11/02/2007   Qualifier: Diagnosis of  By: Mat Carne    . Glaucoma   . Heart murmur   . Hiatal hernia   . History of GI diverticular bleed   . Hyperlipidemia   . Hypertension   . Iron deficiency 2020  . Iron deficiency anemia   . Osteoporosis   . Palpitations 04/02/2010   Qualifier: Diagnosis of  By: Angelena Form, MD, Harrell Gave    . Presbyesophagus   . Stroke [facility])    "small stroke" 2010- no residual per pt    Past Surgical History:  Procedure Laterality Date  . CATARACT EXTRACTION, BILATERAL    . CESAREAN SECTION  1950-1960s   x 3  . CHOLECYSTECTOMY    . COLONOSCOPY N/A 12/19/2015   Procedure: COLONOSCOPY;  Surgeon: Manus Gunning, MD;  Location: [facility];  Service: Gastroenterology;  Laterality: N/A;  . COLONOSCOPY    . [place]  . FACIAL COSMETIC SURGERY    . LAPAROSCOPIC CHOLECYSTECTOMY SINGLE SITE WITH INTRAOPERATIVE CHOLANGIOGRAM N/A  10/27/2014   Procedure: LAPAROSCOPIC CHOLECYSTECTOMY SINGLE SITE WITH INTRAOPERATIVE CHOLANGIOGRAM;  Surgeon: Jencarlo Bonadonna Boston, MD;  Location: [facility];  Service: General;  Laterality: N/A;  . TUBAL LIGATION    . UPPER GASTROINTESTINAL ENDOSCOPY      There were no vitals filed for this visit.  Subjective Assessment - 01/30/20 1016    Subjective  Patient has to leave early as she will be travelling to get her vaccine.  She reports that she still has a lot of questions about the after care of TKA in March    Currently in Pain?  Yes    Pain Score  4     Pain Location  Back    Pain Orientation  Lower    Aggravating Factors   walking and standing                       [facility] Adult PT Treatment/Exercise - 01/30/20 0001      Lumbar Exercises: Stretches   Passive Hamstring Stretch  Right;Left;4 reps;10 seconds      Lumbar Exercises: Aerobic   Recumbent Bike  x 4 minutes    Nustep  level 4 x 6 minutes  Lumbar Exercises: Machines for Strengthening   Cybex Knee Flexion  15# 2x10      Lumbar Exercises: Seated   Long Arc Quad on Chair  Strengthening;Both;2 sets;10 reps;Weights    LAQ on Chair Weights (lbs)  4    Other Seated Lumbar Exercises  ball b/n knees squeeeze 20      Lumbar Exercises: Supine   Other Supine Lumbar Exercises  isometric abdominals with ball 15 times    Other Supine Lumbar Exercises  feet on ball small bridge, KTC, obl  15 each      Moist Heat Therapy   Number Minutes Moist Heat  12 Minutes    Moist Heat Location  Lumbar Spine      Electrical Stimulation   Electrical Stimulation Location  lumbar area    Electrical Stimulation Action  IFC    Electrical Stimulation Parameters  supine    Electrical Stimulation Goals  Pain               PT Short Term Goals - 01/03/20 1037      PT SHORT TERM GOAL #1   Title  Ind with intial HEP    Status  Achieved        PT Long Term Goals - 01/30/20 1043      PT LONG TERM GOAL #2   Title  decrease  pain 50%    Status  Partially Met      PT LONG TERM GOAL #3   Title  increasre lumbar ROM 50%    Status  Partially Met      PT LONG TERM GOAL #4   Title  increase left knee ROM to WNL's    Status  On-going            Plan - 01/30/20 1040    Clinical Impression Statement  Patient continues to have worries about the TKA and the after care, she is worried about going home from hospital without assist, she is talking with her family and trying to arrange.  She would prefer to have home health or have a SNF placement but has been told she may not get that, I have tried to help educate her on what we are seeing recently and how the patients are advancing with the hospital to home to outpatient, she reports feeling stronger but still having some LBP    PT Next Visit Plan  patient had to leave early today to get vaccine.  Will continue to work on strength and stability for her TKA    Consulted and Agree with Plan of Care  Patient       Patient will benefit from skilled therapeutic intervention in order to improve the following deficits and impairments:  Pain, Decreased strength, Decreased range of motion, Abnormal gait, Improper body mechanics, Increased muscle spasms, Decreased mobility, Cardiopulmonary status limiting activity, Postural dysfunction, Difficulty walking, Impaired flexibility  Visit Diagnosis: Acute bilateral low back pain with left-sided sciatica  Acute pain of left knee  Stiffness of left knee, not elsewhere classified  Difficulty in walking, not elsewhere classified     Problem List Patient Active Problem List   Diagnosis Date Noted  . Preoperative clearance 02/22/2019  . Colon polyp   . Lower GI bleed 12/18/2015  . Diverticulosis of colon with hemorrhage   . Nausea with vomiting 12/02/2014  . Ileus, postoperative ([place]) 11/04/2014  . Hyponatremia 11/04/2014  . Abdominal fluid collection   . Chest pain 10/26/2014  . Acute calculous cholecystitis 10/26/2014   .  Barrett esophagus   . History of GI diverticular bleed   . Essential hypertension   . Dyspnea 01/18/2012  . Aortic insufficiency 01/18/2012  . ANEMIA, IRON DEFICIENCY 06/04/2010  . HYPERCHOLESTEROLEMIA 02/08/2008  . GERD 02/08/2008  . ARTHRITIS 02/08/2008  . HIATAL HERNIA 11/02/2007  . DIVERTICULOSIS OF COLON 04/07/2004    Sumner Boast., PT 01/30/2020, 10:44 AM  [facility] [place] [address] [place], [place], [address] Phone: [phone]   Fax:  [phone]  Name: Terri Shah MRN: [id number] Date of Birth: 05-Dec-1934

## 2020-02-07 ENCOUNTER — Ambulatory Visit: Payer: Medicare HMO | Admitting: Physical Therapy

## 2020-02-12 ENCOUNTER — Other Ambulatory Visit: Payer: Self-pay

## 2020-02-12 ENCOUNTER — Encounter: Payer: Self-pay | Admitting: Cardiovascular Disease

## 2020-02-12 ENCOUNTER — Ambulatory Visit: Payer: Medicare HMO | Admitting: Cardiovascular Disease

## 2020-02-12 DIAGNOSIS — I351 Nonrheumatic aortic (valve) insufficiency: Secondary | ICD-10-CM

## 2020-02-12 DIAGNOSIS — Z01818 Encounter for other preprocedural examination: Secondary | ICD-10-CM

## 2020-02-12 DIAGNOSIS — E7800 Pure hypercholesterolemia, unspecified: Secondary | ICD-10-CM

## 2020-02-12 DIAGNOSIS — I10 Essential (primary) hypertension: Secondary | ICD-10-CM | POA: Diagnosis not present

## 2020-02-12 NOTE — Assessment & Plan Note (Signed)
History of essential hypertension a blood pressure measured today at 144/76 although it home her blood pressures in the 130/70 range usually when she measures it.  She is on Lotensin and hydrochlorothiazide.

## 2020-02-12 NOTE — Assessment & Plan Note (Signed)
History of hyperlipidemia on statin therapy with lipid profile performed 05/29/2019 revealing total cholesterol 134, LDL 58 and HDL of 55.

## 2020-02-12 NOTE — Progress Notes (Signed)
02/12/2020 Terri Shah   08/08/1934  [id number]  Primary Physician Shon Baton, MD Primary Cardiologist: Lorretta Harp MD FACP, [place], [place], [place]  HPI:  Terri Shah is a 86 y.o.  mildly overweight widowed Caucasian female mother of 3 children, grandmother and 3 great grandchildren referred by Dr. Virgina Jock for preoperative cardiovascular clearance before elective left total knee replacement by Dr. Alvan Dame.  I last saw her in the office 02/22/2019. She did see Dr. Angelena Form remotely back in 2013.  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  She is never had a heart attack but has had a TIA in the past.  She is otherwise asymptomatic denying chest pain or shortness of breath.  She does have treated hypertension and hyperlipidemia.  She apparently had a negative Myoview and 2D echo back in 2013 when she saw Dr. Angelena Form.  Since I saw her a year ago she ended up not having her knee replacement because of COVID-19.  She is now scheduled to have this done at the end of next month.  She has been asymptomatic since I last saw her.  There is a question of aortic insufficiency in the past but she has not had a 2D echo recently.   Current Meds  Medication Sig  . acetaminophen (TYLENOL) 500 MG tablet Take 500 mg by mouth every 6 (six) hours as needed. Patient used this medication for her pain.  Marland Kitchen amitriptyline (ELAVIL) 25 MG tablet Take 25 mg by mouth at bedtime as needed for sleep.   Marland Kitchen atorvastatin (LIPITOR) 20 MG tablet Take 20 mg by mouth daily.    . benazepril (LOTENSIN) 40 MG tablet Take 20 mg by mouth daily. Takes 1/2 tablet  . calcium carbonate (TUMS EX) 750 MG chewable tablet Chew 1 tablet by mouth daily as needed for heartburn.  . denosumab (PROLIA) 60 MG/ML SOLN injection Inject 60 mg into the skin every 6 (six) months. Administer in upper arm, thigh, or abdomen  . dorzolamide-timolol (COSOPT) 22.3-6.8 MG/ML ophthalmic solution Place 1 drop into the right eye 2 (two) times  daily.   . Ferrous Sulfate (SLOW FE) 142 (45 Fe) MG TBCR Take 1 tablet by mouth every other day.   . hydrochlorothiazide (HYDRODIURIL) 25 MG tablet Take 25 mg by mouth every other day.   . latanoprost (XALATAN) 0.005 % ophthalmic solution Place 1 drop into the right eye daily.  Marland Kitchen loratadine (CLARITIN) 10 MG tablet Take 10 mg by mouth every other day.   . Magnesium 250 MG TABS Take 1 tablet by mouth every other day.  . Menthol, Topical Analgesic, (BIOFREEZE ROLL-ON EX) Apply topically as needed.  Marland Kitchen omeprazole (PRILOSEC) 20 MG capsule Take 20 mg by mouth daily.   . polyvinyl alcohol (LIQUIFILM TEARS) 1.4 % ophthalmic solution Place 1 drop into both eyes daily as needed for dry eyes.  . prednisoLONE acetate (PRED FORTE) 1 % ophthalmic suspension Place 1 drop into the right eye every other day.   . Psyllium (METAMUCIL PO) Take 1 each by mouth daily. 1 heaping teaspoon daily  . Saline 2.65 % SOLN Place 1 each into the nose daily as needed (congestion).  . traMADol (ULTRAM) 50 MG tablet Take 50 mg by mouth daily as needed for moderate pain. Patient states she can have 1-2 tablets a day  . vitamin B-12 (CYANOCOBALAMIN) 1000 MCG tablet Take 1,000 mcg by mouth daily.  . Vitamin D, Cholecalciferol, 50 MCG (2000 UT) CAPS Take  1 capsule by mouth daily.     Allergies  Allergen Reactions  . Codeine Other (See Comments)    "Perception off"     Social History   Socioeconomic History  . Marital status: Widowed    Spouse name: Not on file  . Number of children: 3  . Years of education: Not on file  . Highest education level: Not on file  Occupational History  . Not on file  Tobacco Use  . Smoking status: Never Smoker  . Smokeless tobacco: Never Used  Substance and Sexual Activity  . Alcohol use: Yes    Comment: occasional  . Drug use: No  . Sexual activity: Not on file  Other Topics Concern  . Not on file  Social History Narrative  . Not on file   Social Determinants of Health    Financial Resource Strain:   . Difficulty of Paying Living Expenses: Not on file  Food Insecurity:   . Worried About Charity fundraiser in the Last Year: Not on file  . Ran Out of Food in the Last Year: Not on file  Transportation Needs:   . Lack of Transportation (Medical): Not on file  . Lack of Transportation (Non-Medical): Not on file  Physical Activity:   . Days of Exercise per Week: Not on file  . Minutes of Exercise per Session: Not on file  Stress:   . Feeling of Stress : Not on file  Social Connections:   . Frequency of Communication with Friends and Family: Not on file  . Frequency of Social Gatherings with Friends and Family: Not on file  . Attends Religious Services: Not on file  . Active Member of Clubs or Organizations: Not on file  . Attends Archivist Meetings: Not on file  . Marital Status: Not on file  Intimate Partner Violence:   . Fear of Current or Ex-Partner: Not on file  . Emotionally Abused: Not on file  . Physically Abused: Not on file  . Sexually Abused: Not on file     Review of Systems: General: negative for chills, fever, night sweats or weight changes.  Cardiovascular: negative for chest pain, dyspnea on exertion, edema, orthopnea, palpitations, paroxysmal nocturnal dyspnea or shortness of breath Dermatological: negative for rash Respiratory: negative for cough or wheezing Urologic: negative for hematuria Abdominal: negative for nausea, vomiting, diarrhea, bright red blood per rectum, melena, or hematemesis Neurologic: negative for visual changes, syncope, or dizziness All other systems reviewed and are otherwise negative except as noted above.    Blood pressure (!) 144/76, pulse 61, temperature 98.1 F (36.7 C), height 5\' 1"  (1.549 m), weight 127 lb (57.6 kg).  General appearance: alert and no distress Neck: no adenopathy, no carotid bruit, no JVD, supple, symmetrical, trachea midline and thyroid not enlarged, symmetric, no  tenderness/mass/nodules Lungs: clear to auscultation bilaterally Heart: regular rate and rhythm, S1, S2 normal, no murmur, click, rub or gallop Extremities: extremities normal, atraumatic, no cyanosis or edema Pulses: 2+ and symmetric Skin: Skin color, texture, turgor normal. No rashes or lesions Neurologic: Alert and oriented X 3, normal strength and tone. Normal symmetric reflexes. Normal coordination and gait  EKG sinus rhythm at 61 with right bundle branch block.  I personally reviewed this EKG.  ASSESSMENT AND PLAN:   HYPERCHOLESTEROLEMIA History of hyperlipidemia on statin therapy with lipid profile performed 05/29/2019 revealing total cholesterol 134, LDL 58 and HDL of 55.  Aortic insufficiency History of aortic insufficiency.  We will recheck  a 2D echocardiogram.  Essential hypertension History of essential hypertension a blood pressure measured today at 144/76 although it home her blood pressures in the 130/70 range usually when she measures it.  She is on Lotensin and hydrochlorothiazide.  Preoperative clearance Patient scheduled to undergo elective total knee replacement next month by Dr. Alvan Dame.  Given the relatively low risk nature of the procedure and her lack of symptoms I am going to clear her without the need of functional study.      Lorretta Harp MD FACP,FACC,FAHA, [facility] 02/12/2020 2:39 PM

## 2020-02-12 NOTE — Assessment & Plan Note (Signed)
Patient scheduled to undergo elective total knee replacement next month by Dr. Alvan Dame.  Given the relatively low risk nature of the procedure and her lack of symptoms I am going to clear her without the need of functional study.

## 2020-02-12 NOTE — Patient Instructions (Signed)
Medication Instructions:  Your physician recommends that you continue on your current medications as directed. Please refer to the Current Medication list given to you today.  If you need a refill on your cardiac medications before your next appointment, please call your pharmacy.   Lab work: NONE  Testing/Procedures: Your physician has requested that you have an echocardiogram. Echocardiography is a painless test that uses sound waves to create images of your heart. It provides your doctor with information about the size and shape of your heart and how well your heart's chambers and valves are working. This procedure takes approximately one hour. There are no restrictions for this procedure. [address]  Follow-Up: At [facility], you and your health needs are our priority.  As part of our continuing mission to provide you with exceptional heart care, we have created designated Provider Care Teams.  These Care Teams include your primary Cardiologist (physician) and Advanced Practice Providers (APPs -  Physician Assistants and Nurse Practitioners) who all work together to provide you with the care you need, when you need it. You may see Jonathan Berry, MD or one of the following Advanced Practice Providers on your designated Care Team:    Luke Kilroy, PA-C  Callie Goodrich, PA-C  Jesse Cleaver, FNP  Your physician wants you to follow-up: as needed      

## 2020-02-12 NOTE — Assessment & Plan Note (Signed)
History of aortic insufficiency.  We will recheck a 2D echocardiogram.

## 2020-02-14 ENCOUNTER — Other Ambulatory Visit: Payer: Self-pay

## 2020-02-14 ENCOUNTER — Ambulatory Visit: Payer: Medicare HMO | Admitting: Physical Therapy

## 2020-02-14 ENCOUNTER — Encounter: Payer: Self-pay | Admitting: Physical Therapy

## 2020-02-14 DIAGNOSIS — M25562 Pain in left knee: Secondary | ICD-10-CM

## 2020-02-14 DIAGNOSIS — M25662 Stiffness of left knee, not elsewhere classified: Secondary | ICD-10-CM | POA: Diagnosis not present

## 2020-02-14 DIAGNOSIS — R262 Difficulty in walking, not elsewhere classified: Secondary | ICD-10-CM | POA: Diagnosis not present

## 2020-02-14 DIAGNOSIS — M5442 Lumbago with sciatica, left side: Secondary | ICD-10-CM | POA: Diagnosis not present

## 2020-02-14 NOTE — Therapy (Signed)
[place], [place], [address] Phone: [phone]   Fax:  [phone]  Physical Therapy Treatment  Patient Details  Name: Terri Shah MRN: [id number] Date of Birth: 1934-09-18 Referring Provider (PT): Nelva Bush   Encounter Date: 02/14/2020  PT End of Session - 02/14/20 1100    Visit Number  9    PT Start Time  6962    PT Stop Time  1111    PT Time Calculation (min)  56 min    Activity Tolerance  Patient tolerated treatment well    Behavior During Therapy  [facility] for tasks assessed/performed       Past Medical History:  Diagnosis Date  . Allergy   . Anemia   . Arthritis   . Barrett esophagus   . Barrett's esophagus 11/02/2007   Qualifier: Diagnosis of  By: Mat Carne    . Blood transfusion without reported diagnosis   . Cataract   . Diverticulosis   . ECTOPIC PREGNANCY 02/08/2008   Qualifier: Diagnosis of  By: Mat Carne    . Gastritis   . GASTRITIS 11/02/2007   Qualifier: Diagnosis of  By: Mat Carne    . Glaucoma   . Heart murmur   . Hiatal hernia   . History of GI diverticular bleed   . Hyperlipidemia   . Hypertension   . Iron deficiency 2020  . Iron deficiency anemia   . Osteoporosis   . Palpitations 04/02/2010   Qualifier: Diagnosis of  By: Angelena Form, MD, Harrell Gave    . Presbyesophagus   . Stroke [facility])    "small stroke" 2010- no residual per pt    Past Surgical History:  Procedure Laterality Date  . CATARACT EXTRACTION, BILATERAL    . CESAREAN SECTION  1950-1960s   x 3  . CHOLECYSTECTOMY    . COLONOSCOPY N/A 12/19/2015   Procedure: COLONOSCOPY;  Surgeon: Manus Gunning, MD;  Location: [facility];  Service: Gastroenterology;  Laterality: N/A;  . COLONOSCOPY    . [place]  . FACIAL COSMETIC SURGERY    . LAPAROSCOPIC CHOLECYSTECTOMY SINGLE SITE WITH INTRAOPERATIVE CHOLANGIOGRAM N/A 10/27/2014   Procedure: LAPAROSCOPIC  CHOLECYSTECTOMY SINGLE SITE WITH INTRAOPERATIVE CHOLANGIOGRAM;  Surgeon: Morgan Rennert Boston, MD;  Location[facility];  Service: General;  Laterality: N/A;  . TUBAL LIGATION    . UPPER GASTROINTESTINAL ENDOSCOPY      There were no vitals filed for this visit.  Subjective Assessment - 02/14/20 1019    Subjective  Patient has scheduled her left TKA for 03/11/20.  She reports that she is doing the HEP and feeling pretty good about it.    Currently in Pain?  Yes    Pain Score  2     Pain Location  Back    Pain Orientation  Lower    Pain Descriptors / Indicators  Aching    Aggravating Factors   worse as the day goes on                       [facility] Adult PT Treatment/Exercise - 02/14/20 0001      Transfers   Comments  worked with her on toilet transfers how to get up iif the surface is low, using take, toilet seat or the vanity      Ambulation/Gait   Gait Comments  worked with her on how to use a walker and a  SPC, for after her TKA, step to patter      Lumbar Exercises: Aerobic   Recumbent Bike  x 5 minutes    Nustep  level 4 x 6 minutes      Lumbar Exercises: Seated   Long Arc Quad on Chair  Strengthening;Both;2 sets;10 reps;Weights    LAQ on Chair Weights (lbs)  4    Other Seated Lumbar Exercises  low load long duration stretches      Moist Heat Therapy   Number Minutes Moist Heat  12 Minutes    Moist Heat Location  Lumbar Spine      Electrical Stimulation   Electrical Stimulation Location  lumbar area    Electrical Stimulation Action  IFC    Electrical Stimulation Parameters  supine    Electrical Stimulation Goals  Pain               PT Short Term Goals - 01/03/20 1037      PT SHORT TERM GOAL #1   Title  Ind with intial HEP    Status  Achieved        PT Long Term Goals - 02/14/20 1101      PT LONG TERM GOAL #1   Title  understnad posture and body mechanics    Status  Achieved      PT LONG TERM GOAL #2   Title  decrease pain 50%    Status   Achieved      PT LONG TERM GOAL #3   Title  increasre lumbar ROM 50%    Status  Achieved      PT LONG TERM GOAL #4   Title  increase left knee ROM to WNL's    Status  Achieved            Plan - 02/14/20 1100    Clinical Impression Statement  Patient reports that her back is feeling much better and she feels good about preparing for the TKA scheduled March 23.  She feels she understands the gait and the transfers but is a little scared about going hom eif she is made to go home from hospital    PT Next Visit Plan  will d/c with goals met    Consulted and Agree with Plan of Care  Patient       Patient will benefit from skilled therapeutic intervention in order to improve the following deficits and impairments:  Pain, Decreased strength, Decreased range of motion, Abnormal gait, Improper body mechanics, Increased muscle spasms, Decreased mobility, Cardiopulmonary status limiting activity, Postural dysfunction, Difficulty walking, Impaired flexibility  Visit Diagnosis: Acute bilateral low back pain with left-sided sciatica  Acute pain of left knee  Stiffness of left knee, not elsewhere classified  Difficulty in walking, not elsewhere classified     Problem List Patient Active Problem List   Diagnosis Date Noted  . Preoperative clearance 02/22/2019  . Colon polyp   . Lower GI bleed 12/18/2015  . Diverticulosis of colon with hemorrhage   . Nausea with vomiting 12/02/2014  . Ileus, postoperative ([place]) 11/04/2014  . Hyponatremia 11/04/2014  . Abdominal fluid collection   . Chest pain 10/26/2014  . Acute calculous cholecystitis 10/26/2014  . Barrett esophagus   . History of GI diverticular bleed   . Essential hypertension   . Dyspnea 01/18/2012  . Aortic insufficiency 01/18/2012  . ANEMIA, IRON DEFICIENCY 06/04/2010  . HYPERCHOLESTEROLEMIA 02/08/2008  . GERD 02/08/2008  . ARTHRITIS 02/08/2008  . HIATAL HERNIA 11/02/2007  .  DIVERTICULOSIS OF COLON 04/07/2004     Sumner Boast., PT 02/14/2020, 11:02 AM  [place] [address] [place], [place], [address] Phone: [phone]   Fax:  [phone]  Name: NINOSHKA WAINWRIGHT MRN: [id number] Date of Birth: 19-Aug-1934

## 2020-02-14 NOTE — Patient Instructions (Signed)
Access Code: [email or web address]  URL: [email or web address]  Date: 02/14/2020  Prepared by: Lum Babe   Exercises Seated Long Arc Quad - 10 reps - 3 sets - 3 hold - 1x daily - 7x weekly Seated Knee Flexion - 10 reps - 3 sets - 10 hold - 1x daily - 7x weekly Seated Ankle Dorsiflexion AROM - 10 reps - 3 sets - 3 hold - 1x daily - 7x weekly Seated Heel Raise - 10 reps - 3 sets - 3 hold - 1x daily - 7x weekly Seated March - 10 reps - 3 sets - 3 hold - 1x daily - 7x weekly

## 2020-02-28 NOTE — H&P (Addendum)
TOTAL KNEE ADMISSION H&P  Patient is being admitted for left total knee arthroplasty.  Subjective:  Chief Complaint:  Left knee primary OA / pain.  HPI: TANIQUE VANALLEN, 86 y.o. female, has a history of pain and functional disability in the left knee due to arthritis and has failed non-surgical conservative treatments for greater than 12 weeks to include NSAID's and/or analgesics, corticosteriod injections and activity modification.  Onset of symptoms was gradual, starting 3 years ago with gradually worsening course since that time. The patient noted no past surgery on the left knee(s).  Patient currently rates pain in the left knee(s) at 8 out of 10 with activity. Patient has night pain, worsening of pain with activity and weight bearing, pain that interferes with activities of daily living and pain with passive range of motion.  Patient has evidence of periarticular osteophytes and joint space narrowing by imaging studies.  There is no active infection.  Risks, benefits and expectations were discussed with the patient.  Risks including but not limited to the risk of anesthesia, blood clots, nerve damage, blood vessel damage, failure of the prosthesis, infection and up to and including death.  Patient understand the risks, benefits and expectations and wishes to proceed with surgery.   PCP: Shon Baton, MD  D/C Plans:       Home w/ HHPT  Post-op Meds:       No Rx given  Tranexamic Acid:      To be given - IV   Decadron:      Is to be given  FYI:      Xarelto 21 days  Norco  2 days in the hospital  DME:   Pt already has equipment  PT:   [facility] then OPPT      Patient Active Problem List   Diagnosis Date Noted  . Preoperative clearance 02/22/2019  . Colon polyp   . Lower GI bleed 12/18/2015  . Diverticulosis of colon with hemorrhage   . Nausea with vomiting 12/02/2014  . Ileus, postoperative ([place]) 11/04/2014  . Hyponatremia 11/04/2014  . Abdominal fluid collection   . Chest pain  10/26/2014  . Acute calculous cholecystitis 10/26/2014  . Barrett esophagus   . History of GI diverticular bleed   . Essential hypertension   . Dyspnea 01/18/2012  . Aortic insufficiency 01/18/2012  . ANEMIA, IRON DEFICIENCY 06/04/2010  . HYPERCHOLESTEROLEMIA 02/08/2008  . GERD 02/08/2008  . ARTHRITIS 02/08/2008  . HIATAL HERNIA 11/02/2007  . DIVERTICULOSIS OF COLON 04/07/2004   Past Medical History:  Diagnosis Date  . Allergy   . Anemia   . Arthritis   . Barrett esophagus   . Barrett's esophagus 11/02/2007   Qualifier: Diagnosis of  By: Mat Carne    . Blood transfusion without reported diagnosis   . Cataract   . Diverticulosis   . ECTOPIC PREGNANCY 02/08/2008   Qualifier: Diagnosis of  By: Mat Carne    . Gastritis   . GASTRITIS 11/02/2007   Qualifier: Diagnosis of  By: Mat Carne    . Glaucoma   . Heart murmur   . Hiatal hernia   . History of GI diverticular bleed   . Hyperlipidemia   . Hypertension   . Iron deficiency 2020  . Iron deficiency anemia   . Osteoporosis   . Palpitations 04/02/2010   Qualifier: Diagnosis of  By: Angelena Form, MD, Harrell Gave    . Presbyesophagus   . Stroke [facility])    "small stroke" 2010- no  residual per pt    Past Surgical History:  Procedure Laterality Date  . CATARACT EXTRACTION, BILATERAL    . CESAREAN SECTION  1950-1960s   x 3  . CHOLECYSTECTOMY    . COLONOSCOPY N/A 12/19/2015   Procedure: COLONOSCOPY;  Surgeon: Manus Gunning, MD;  Location: [facility];  Service: Gastroenterology;  Laterality: N/A;  . COLONOSCOPY    . [place]  . FACIAL COSMETIC SURGERY    . LAPAROSCOPIC CHOLECYSTECTOMY SINGLE SITE WITH INTRAOPERATIVE CHOLANGIOGRAM N/A 10/27/2014   Procedure: LAPAROSCOPIC CHOLECYSTECTOMY SINGLE SITE WITH INTRAOPERATIVE CHOLANGIOGRAM;  Surgeon: Michael Boston, MD;  Location: [facility];  Service: General;  Laterality: N/A;  . TUBAL LIGATION    . UPPER GASTROINTESTINAL ENDOSCOPY       No current facility-administered medications for this encounter.   Current Outpatient Medications  Medication Sig Dispense Refill Last Dose  . acetaminophen (TYLENOL) 500 MG tablet Take 500 mg by mouth every 6 (six) hours as needed (for pain.).      Marland Kitchen amitriptyline (ELAVIL) 25 MG tablet Take 25 mg by mouth at bedtime as needed for sleep.      Marland Kitchen atorvastatin (LIPITOR) 20 MG tablet Take 20 mg by mouth daily.       . benazepril (LOTENSIN) 20 MG tablet Take 20 mg by mouth daily.     . calcium carbonate (TUMS EX) 750 MG chewable tablet Chew 1 tablet by mouth daily as needed for heartburn.     . Camphor-Menthol-Methyl Sal (SALONPAS EX) Apply 1 application topically daily as needed (pain.).     Marland Kitchen denosumab (PROLIA) 60 MG/ML SOLN injection Inject 60 mg into the skin every 6 (six) months. Administer in upper arm, thigh, or abdomen     . dorzolamide-timolol (COSOPT) 22.3-6.8 MG/ML ophthalmic solution Place 1 drop into the right eye 2 (two) times daily.      . Ferrous Sulfate (SLOW FE) 142 (45 Fe) MG TBCR Take 45 mg by mouth every other day.      . fluticasone (FLONASE) 50 MCG/ACT nasal spray Place 1 spray into both nostrils every other day.     . hydrochlorothiazide (HYDRODIURIL) 25 MG tablet Take 25 mg by mouth every other day.      . latanoprost (XALATAN) 0.005 % ophthalmic solution Place 1 drop into the right eye at bedtime.      Marland Kitchen loratadine (CLARITIN) 10 MG tablet Take 10 mg by mouth every other day.      . Magnesium 250 MG TABS Take 250 mg by mouth every other day.      . Menthol, Topical Analgesic, (BIOFREEZE ROLL-ON EX) Apply 1 application topically 3 (three) times daily as needed (pain.).      Marland Kitchen omeprazole (PRILOSEC) 20 MG capsule Take 20 mg by mouth daily.      . polyvinyl alcohol (LIQUIFILM TEARS) 1.4 % ophthalmic solution Place 1 drop into both eyes 3 (three) times daily as needed for dry eyes.      . prednisoLONE acetate (PRED FORTE) 1 % ophthalmic suspension Place 1 drop into the right eye  every other day.      . Psyllium (METAMUCIL PO) Take 1 each by mouth daily. 1 heaping teaspoon daily     . Saline 2.65 % SOLN Place 1 each into the nose daily as needed (congestion).     . traMADol (ULTRAM) 50 MG tablet Take 50 mg by mouth See admin instructions. Take 1 tablet (50 mg) by mouth scheduled mid-morning &  may take an additional tablet if needed for pain.     . vitamin B-12 (CYANOCOBALAMIN) 1000 MCG tablet Take 1,000 mcg by mouth daily.     . Vitamin D, Cholecalciferol, 50 MCG (2000 UT) CAPS Take 2,000 Units by mouth daily.       Allergies  Allergen Reactions  . Codeine Other (See Comments)    "Perception off"     Social History   Tobacco Use  . Smoking status: Never Smoker  . Smokeless tobacco: Never Used  Substance Use Topics  . Alcohol use: Yes    Comment: occasional    Family History  Problem Relation Age of Onset  . Heart disease Mother   . Stroke Mother   . Heart disease Father   . Breast cancer Sister   . Diabetes Sister   . Kidney disease Sister   . Colon cancer Neg Hx   . Esophageal cancer Neg Hx   . Rectal cancer Neg Hx   . Stomach cancer Neg Hx      Review of Systems  Constitutional: Negative.   HENT: Negative.   Eyes: Negative.   Respiratory: Negative.   Cardiovascular: Negative.   Gastrointestinal: Positive for heartburn.  Genitourinary: Negative.   Musculoskeletal: Positive for joint pain.  Skin: Negative.   Neurological: Negative.   Endo/Heme/Allergies: Positive for environmental allergies.  Psychiatric/Behavioral: Negative.       Objective:  Physical Exam  Constitutional: She is oriented to person, place, and time. She appears well-developed.  HENT:  Head: Normocephalic.  Eyes: Pupils are equal, round, and reactive to light.  Neck: No JVD present. No tracheal deviation present. No thyromegaly present.  Cardiovascular: Normal rate, regular rhythm and intact distal pulses.  Murmur heard. Respiratory: Effort normal and breath sounds  normal. No respiratory distress. She has no wheezes.  GI: Soft. There is no abdominal tenderness. There is no guarding.  Musculoskeletal:     Cervical back: Neck supple.     Left knee: Swelling and bony tenderness present. No deformity, erythema, ecchymosis or lacerations. Decreased range of motion. Tenderness present.  Lymphadenopathy:    She has no cervical adenopathy.  Neurological: She is alert and oriented to person, place, and time.  Skin: Skin is warm and dry.  Psychiatric: She has a normal mood and affect.     Labs:  Estimated body mass index is 24 kg/m as calculated from the following:   Height as of 02/12/20: 5\' 1"  (1.549 m).   Weight as of 02/12/20: 57.6 kg.   Imaging Review Plain radiographs demonstrate severe degenerative joint disease of the left knee(s). The bone quality appears to be good for age and reported activity level.      Assessment/Plan:  End stage arthritis, left knee   The patient history, physical examination, clinical judgment of the provider and imaging studies are consistent with end stage degenerative joint disease of the left knee(s) and total knee arthroplasty is deemed medically necessary. The treatment options including medical management, injection therapy arthroscopy and arthroplasty were discussed at length. The risks and benefits of total knee arthroplasty were presented and reviewed. The risks due to aseptic loosening, infection, stiffness, patella tracking problems, thromboembolic complications and other imponderables were discussed. The patient acknowledged the explanation, agreed to proceed with the plan and consent was signed. Patient is being admitted for inpatient treatment for surgery, pain control, PT, OT, prophylactic antibiotics, VTE prophylaxis, progressive ambulation and ADL's and discharge planning. The patient is planning to be discharged home with home  health services    Anticipated LOS equal to or greater than 2 midnights due  to - Age 20 and older with one or more of the following:  - Obesity  - Expected need for hospital services (PT, OT, Nursing) required for safe  discharge  - Anticipated need for postoperative skilled nursing care or inpatient rehab  - Active co-morbidities: Stroke    [place]. Jayde Mcallister   PA-C  02/28/2020, 3:17 PM

## 2020-03-03 ENCOUNTER — Ambulatory Visit (HOSPITAL_COMMUNITY): Payer: Medicare HMO | Attending: Internal Medicine

## 2020-03-03 ENCOUNTER — Other Ambulatory Visit: Payer: Self-pay

## 2020-03-03 DIAGNOSIS — I351 Nonrheumatic aortic (valve) insufficiency: Secondary | ICD-10-CM | POA: Diagnosis not present

## 2020-03-03 NOTE — Progress Notes (Signed)
DUE TO COVID-19 ONLY ONE VISITOR IS ALLOWED TO COME WITH YOU AND STAY IN THE WAITING ROOM ONLY DURING PRE OP AND PROCEDURE DAY OF SURGERY. THE 1 VISITOR MAY VISIT WITH YOU AFTER SURGERY IN YOUR PRIVATE ROOM DURING VISITING HOURS ONLY!  YOU NEED TO HAVE A COVID 19 TEST ON_______ @_______ , THIS TEST MUST BE DONE BEFORE SURGERY, COME  [address] [place] , [address].  ([place]) ONCE YOUR COVID TEST IS COMPLETED, PLEASE BEGIN THE QUARANTINE INSTRUCTIONS AS OUTLINED IN YOUR HANDOUT.                Terri Shah  03/03/2020   Your procedure is scheduled on:  03/11/20   Report to [facility] Main  Entrance   Report to admitting at    0730AM  AM     Call this number if you have problems the morning of surgery [phone]    Remember: Do not eat food or drink liquids :After Midnight. BRUSH YOUR TEETH MORNING OF SURGERY AND RINSE YOUR MOUTH OUT, NO CHEWING GUM CANDY OR MINTS.     Take these medicines the morning of surgery with A SIP OF WATER:  EYE DROPS AS USUAL, FLONASE IF DUE, CLARITIN IF DUE, PRILOSEC                                 You may not have any metal on your body including hair pins and              piercings  Do not wear jewelry, make-up, lotions, powders or perfumes, deodorant             Do not wear nail polish on your fingernails.  Do not shave  48 hours prior to surgery.            Do not bring valuables to the hospital. [place].  Contacts, dentures or bridgework may not be worn into surgery.  Leave suitcase in the car. After surgery it may be brought to your room.     Patients discharged the day of surgery will not be allowed to drive home. IF YOU ARE HAVING SURGERY AND GOING HOME THE SAME DAY, YOU MUST HAVE AN ADULT TO DRIVE YOU HOME AND BE WITH YOU FOR 24 HOURS. YOU MAY GO HOME BY TAXI OR UBER OR ORTHERWISE, BUT AN ADULT MUST ACCOMPANY YOU HOME AND STAY WITH YOU FOR 24 HOURS.  Name  and phone number of your driver:                Please read over the following fact sheets you were given: _____________________________________________________________________             NO SOLID FOOD AFTER MIDNIGHT THE NIGHT PRIOR TO SURGERY. NOTHING BY MOUTH EXCEPT CLEAR LIQUIDS [id number]  . PLEASE FINISH ENSURE DRINK PER SURGEON ORDER  WHICH NEEDS TO BE COMPLETED AT 0700AM .   CLEAR LIQUID DIET   Foods Allowed  Foods Excluded  Coffee and tea, regular and decaf                             liquids that you cannot  Plain Jell-O any favor except red or purple                                           see through such as: Fruit ices (not with fruit pulp)                                     milk, soups, orange juice  Iced Popsicles                                    All solid food Carbonated beverages, regular and diet                                    Cranberry, grape and apple juices Sports drinks like Gatorade Lightly seasoned clear broth or consume(fat free) Sugar, honey syrup  Sample Menu Breakfast                                Lunch                                     Supper Cranberry juice                    Beef broth                            Chicken broth Jell-O                                     Grape juice                           Apple juice Coffee or tea                        Jell-O                                      Popsicle                                                Coffee or tea                        Coffee or tea  _____________________________________________________________________  [facility] - Preparing for Surgery Before surgery, you can play an important role.  Because skin is not sterile, your skin  needs to be as free of germs as possible.  You can reduce the number of germs on your skin by washing with CHG (chlorahexidine gluconate) soap before surgery.  CHG is an antiseptic  cleaner which kills germs and bonds with the skin to continue killing germs even after washing. Please DO NOT use if you have an allergy to CHG or antibacterial soaps.  If your skin becomes reddened/irritated stop using the CHG and inform your nurse when you arrive at Short Stay. Do not shave (including legs and underarms) for at least 48 hours prior to the first CHG shower.  You may shave your face/neck. Please follow these instructions carefully:  1.  Shower with CHG Soap the night before surgery and the  morning of Surgery.  2.  If you choose to wash your hair, wash your hair first as usual with your  normal  shampoo.  3.  After you shampoo, rinse your hair and body thoroughly to remove the  shampoo.                           4.  Use CHG as you would any other liquid soap.  You can apply chg directly  to the skin and wash                       Gently with a scrungie or clean washcloth.  5.  Apply the CHG Soap to your body ONLY FROM THE NECK DOWN.   Do not use on face/ open                           Wound or open sores. Avoid contact with eyes, ears mouth and genitals (private parts).                       Wash face,  Genitals (private parts) with your normal soap.             6.  Wash thoroughly, paying special attention to the area where your surgery  will be performed.  7.  Thoroughly rinse your body with warm water from the neck down.  8.  DO NOT shower/wash with your normal soap after using and rinsing off  the CHG Soap.                9.  Pat yourself dry with a clean towel.            10.  Wear clean pajamas.            11.  Place clean sheets on your bed the night of your first shower and do not  sleep with pets. Day of Surgery : Do not apply any lotions/deodorants the morning of surgery.  Please wear clean clothes to the hospital/surgery center.  FAILURE TO FOLLOW THESE INSTRUCTIONS MAY RESULT IN THE CANCELLATION OF YOUR SURGERY PATIENT  SIGNATURE_________________________________  NURSE SIGNATURE__________________________________  ________________________________________________________________________   Terri Shah  An incentive spirometer is a tool that can help keep your lungs clear and active. This tool measures how well you are filling your lungs with each breath. Taking long deep breaths may help reverse or decrease the chance of developing breathing (pulmonary) problems (especially infection) following:  A long period of time when you are unable to move or be active. BEFORE THE PROCEDURE   If the spirometer includes an indicator to  show your best effort, your nurse or respiratory therapist will set it to a desired goal.  If possible, sit up straight or lean slightly forward. Try not to slouch.  Hold the incentive spirometer in an upright position. INSTRUCTIONS FOR USE  1. Sit on the edge of your bed if possible, or sit up as far as you can in bed or on a chair. 2. Hold the incentive spirometer in an upright position. 3. Breathe out normally. 4. Place the mouthpiece in your mouth and seal your lips tightly around it. 5. Breathe in slowly and as deeply as possible, raising the piston or the ball toward the top of the column. 6. Hold your breath for 3-5 seconds or for as long as possible. Allow the piston or ball to fall to the bottom of the column. 7. Remove the mouthpiece from your mouth and breathe out normally. 8. Rest for a few seconds and repeat Steps 1 through 7 at least 10 times every 1-2 hours when you are awake. Take your time and take a few normal breaths between deep breaths. 9. The spirometer may include an indicator to show your best effort. Use the indicator as a goal to work toward during each repetition. 10. After each set of 10 deep breaths, practice coughing to be sure your lungs are clear. If you have an incision (the cut made at the time of surgery), support your incision when coughing  by placing a pillow or rolled up towels firmly against it. Once you are able to get out of bed, walk around indoors and cough well. You may stop using the incentive spirometer when instructed by your caregiver.  RISKS AND COMPLICATIONS  Take your time so you do not get dizzy or light-headed.  If you are in pain, you may need to take or ask for pain medication before doing incentive spirometry. It is harder to take a deep breath if you are having pain. AFTER USE  Rest and breathe slowly and easily.  It can be helpful to keep track of a log of your progress. Your caregiver can provide you with a simple table to help with this. If you are using the spirometer at home, follow these instructions: [place] IF:   You are having difficultly using the spirometer.  You have trouble using the spirometer as often as instructed.  Your pain medication is not giving enough relief while using the spirometer.  You develop fever of 100.5 F (38.1 C) or higher. SEEK IMMEDIATE MEDICAL CARE IF:   You cough up bloody sputum that had not been present before.  You develop fever of 102 F (38.9 C) or greater.  You develop worsening pain at or near the incision site. MAKE SURE YOU:   Understand these instructions.  Will watch your condition.  Will get help right away if you are not doing well or get worse. Document Released: 04/18/2007 Document Revised: 02/28/2012 Document Reviewed: 06/19/2007 ExitCare Patient Information 2014 [employer], [place].   ________________________________________________________________________  WHAT IS A BLOOD TRANSFUSION? Blood Transfusion Information  A transfusion is the replacement of blood or some of its parts. Blood is made up of multiple cells which provide different functions.  Red blood cells carry oxygen and are used for blood loss replacement.  White blood cells fight against infection.  Platelets control bleeding.  Plasma helps clot  blood.  Other blood products are available for specialized needs, such as hemophilia or other clotting disorders. BEFORE THE TRANSFUSION  Who gives  blood for transfusions?   Healthy volunteers who are fully evaluated to make sure their blood is safe. This is blood bank blood. Transfusion therapy is the safest it has ever been in the practice of medicine. Before blood is taken from a donor, a complete history is taken to make sure that person has no history of diseases nor engages in risky social behavior (examples are intravenous drug use or sexual activity with multiple partners). The donor's travel history is screened to minimize risk of transmitting infections, such as malaria. The donated blood is tested for signs of infectious diseases, such as HIV and hepatitis. The blood is then tested to be sure it is compatible with you in order to minimize the chance of a transfusion reaction. If you or a relative donates blood, this is often done in anticipation of surgery and is not appropriate for emergency situations. It takes many days to process the donated blood. RISKS AND COMPLICATIONS Although transfusion therapy is very safe and saves many lives, the main dangers of transfusion include:   Getting an infectious disease.  Developing a transfusion reaction. This is an allergic reaction to something in the blood you were given. Every precaution is taken to prevent this. The decision to have a blood transfusion has been considered carefully by your caregiver before blood is given. Blood is not given unless the benefits outweigh the risks. AFTER THE TRANSFUSION  Right after receiving a blood transfusion, you will usually feel much better and more energetic. This is especially true if your red blood cells have gotten low (anemic). The transfusion raises the level of the red blood cells which carry oxygen, and this usually causes an energy increase.  The nurse administering the transfusion will monitor  you carefully for complications. HOME CARE INSTRUCTIONS  No special instructions are needed after a transfusion. You may find your energy is better. Speak with your caregiver about any limitations on activity for underlying diseases you may have. SEEK MEDICAL CARE IF:   Your condition is not improving after your transfusion.  You develop redness or irritation at the intravenous (IV) site. SEEK IMMEDIATE MEDICAL CARE IF:  Any of the following symptoms occur over the next 12 hours:  Shaking chills.  You have a temperature by mouth above 102 F (38.9 C), not controlled by medicine.  Chest, back, or muscle pain.  People around you feel you are not acting correctly or are confused.  Shortness of breath or difficulty breathing.  Dizziness and fainting.  You get a rash or develop hives.  You have a decrease in urine output.  Your urine turns a dark color or changes to pink, red, or brown. Any of the following symptoms occur over the next 10 days:  You have a temperature by mouth above 102 F (38.9 C), not controlled by medicine.  Shortness of breath.  Weakness after normal activity.  The white part of the eye turns yellow (jaundice).  You have a decrease in the amount of urine or are urinating less often.  Your urine turns a dark color or changes to pink, red, or brown. Document Released: 12/03/2000 Document Revised: 02/28/2012 Document Reviewed: 07/22/2008 [facility] Patient Information 2014 [place], [place].  _______________________________________________________________________

## 2020-03-04 ENCOUNTER — Encounter (HOSPITAL_COMMUNITY): Payer: Self-pay

## 2020-03-04 ENCOUNTER — Encounter (HOSPITAL_COMMUNITY)
Admission: RE | Admit: 2020-03-04 | Discharge: 2020-03-04 | Disposition: A | Discharge status: Home / Self Care | Payer: Medicare HMO | Source: Ambulatory Visit | Attending: Orthopedic Surgery | Admitting: Orthopedic Surgery

## 2020-03-04 ENCOUNTER — Other Ambulatory Visit: Payer: Self-pay

## 2020-03-04 DIAGNOSIS — Z20822 Contact with and (suspected) exposure to covid-19: Secondary | ICD-10-CM | POA: Diagnosis not present

## 2020-03-04 DIAGNOSIS — Z01818 Encounter for other preprocedural examination: Secondary | ICD-10-CM | POA: Diagnosis present

## 2020-03-04 HISTORY — DX: Fibromyalgia: M79.7

## 2020-03-04 HISTORY — DX: Family history of other specified conditions: Z84.89

## 2020-03-04 HISTORY — DX: Other specified postprocedural states: Z98.890

## 2020-03-04 HISTORY — DX: Gastro-esophageal reflux disease without esophagitis: K21.9

## 2020-03-04 HISTORY — DX: Nausea with vomiting, unspecified: R11.2

## 2020-03-04 NOTE — Progress Notes (Signed)
PCP - Shon Baton Cardiologist - ovn Dr. Gwenlyn Found 02-12-20 epic  Clearance 01-17-20 telephone encounter  Chest x-ray -  EKG - 02-12-20 epic Stress Test -  ECHO - 03-03-20 Cardiac Cath -   Sleep Study -  CPAP -   Fasting Blood Sugar -  Checks Blood Sugar _____ times a day  Blood Thinner Instructions: Aspirin Instructions: Last Dose:  Anesthesia review:   Patient denies shortness of breath, fever, cough and chest pain at PAT appointment  none   Patient verbalized understanding of instructions that were given to them at the PAT appointment. Patient was also instructed that they will need to review over the PAT instructions again at home before surgery.

## 2020-03-04 NOTE — Patient Instructions (Addendum)
DUE TO COVID-19 ONLY ONE VISITOR IS ALLOWED TO COME WITH YOU AND STAY IN THE WAITING ROOM ONLY DURING PRE OP AND PROCEDURE DAY OF SURGERY. THE 1 VISITOR MAY VISIT WITH YOU AFTER SURGERY IN YOUR PRIVATE ROOM DURING VISITING HOURS ONLY!   10 am -8pm  YOU NEED TO HAVE A COVID 19 TEST ON_3-19-21______ @_1030______ , THIS TEST MUST BE DONE BEFORE SURGERY, COME  [address], [place] , [address].  ([place]) ONCE YOUR COVID TEST IS COMPLETED, PLEASE BEGIN THE QUARANTINE INSTRUCTIONS AS OUTLINED IN YOUR HANDOUT.                Terri Shah  03/04/2020   Your procedure is scheduled on: 03-11-20   Report to [facility] Main  Entrance   Report to admitting at    0730  AM     Call this number if you have problems the morning of surgery [phone]    Remember: Do not eat food :After [place], NO CHEWING GUM CANDY OR MINTS.     Take these medicines the morning of surgery with A SIP OF WATER:  EYE DROPS AS USUAL, FLONASE IF DUE, CLARITIN IF DUE, PRILOSEC                                 You may not have any metal on your body including hair pins and              piercings  Do not wear jewelry, make-up, lotions, powders or perfumes, deodorant             Do not wear nail polish on your fingernails.  Do not shave  48 hours prior to surgery.            Do not bring valuables to the hospital. [place].  Contacts, dentures or bridgework may not be worn into surgery.  Leave suitcase in the car. After surgery it may be brought to your room.     Patients discharged the day of surgery will not be allowed to drive home. IF YOU ARE HAVING SURGERY AND GOING HOME THE SAME DAY, YOU MUST HAVE AN ADULT TO DRIVE YOU HOME AND BE WITH YOU FOR 24 HOURS. YOU MAY GO HOME BY TAXI OR UBER OR ORTHERWISE, BUT AN ADULT MUST ACCOMPANY YOU HOME AND STAY WITH YOU FOR 24  HOURS.  Name and phone number of your driver:                Please read over the following fact sheets you were given: _____________________________________________________________________             NO SOLID FOOD AFTER MIDNIGHT THE NIGHT PRIOR TO SURGERY. NOTHING BY MOUTH EXCEPT CLEAR LIQUIDS UNTIL 0700AM  . PLEASE FINISH ENSURE DRINK PER SURGEON ORDER  WHICH NEEDS TO BE COMPLETED AT      0700AM .  THEN NOTHING BY MOUTH   CLEAR LIQUID DIET   Foods Allowed  Foods Excluded  Coffee and tea, regular and decaf                                            liquids that you cannot  Plain Jell-O any favor except red or purple                                           see through such as: Fruit ices (not with fruit pulp)                                                   milk, soups, orange juice  Iced Popsicles                                                       All solid food Carbonated beverages, regular and diet                                    Cranberry, grape and apple juices Sports drinks like Gatorade Lightly seasoned clear broth or consume(fat free) Sugar, honey syrup  Sample Menu Breakfast                                Lunch                                     Supper Cranberry juice                    Beef broth                            Chicken broth Jell-O                                     Grape juice                           Apple juice Coffee or tea                        Jell-O                                      Popsicle                                                Coffee or tea  Coffee or tea  _____________________________________________________________________  [facility] - Preparing for Surgery Before surgery, you can play an important role.  Because skin is not sterile, your skin needs to be as free of germs as possible.  You can reduce the number of germs on your  skin by washing with CHG (chlorahexidine gluconate) soap before surgery.  CHG is an antiseptic cleaner which kills germs and bonds with the skin to continue killing germs even after washing. Please DO NOT use if you have an allergy to CHG or antibacterial soaps.  If your skin becomes reddened/irritated stop using the CHG and inform your nurse when you arrive at Short Stay. Do not shave (including legs and underarms) for at least 48 hours prior to the first CHG shower.  You may shave your face/neck. Please follow these instructions carefully:  1.  Shower with CHG Soap the night before surgery and the  morning of Surgery.  2.  If you choose to wash your hair, wash your hair first as usual with your  normal  shampoo.  3.  After you shampoo, rinse your hair and body thoroughly to remove the  shampoo.                           4.  Use CHG as you would any other liquid soap.  You can apply chg directly  to the skin and wash                       Gently with a scrungie or clean washcloth.  5.  Apply the CHG Soap to your body ONLY FROM THE NECK DOWN.   Do not use on face/ open                           Wound or open sores. Avoid contact with eyes, ears mouth and genitals (private parts).                       Wash face,  Genitals (private parts) with your normal soap.             6.  Wash thoroughly, paying special attention to the area where your surgery  will be performed.  7.  Thoroughly rinse your body with warm water from the neck down.  8.  DO NOT shower/wash with your normal soap after using and rinsing off  the CHG Soap.                9.  Pat yourself dry with a clean towel.            10.  Wear clean pajamas.            11.  Place clean sheets on your bed the night of your first shower and do not  sleep with pets. Day of Surgery : Do not apply any lotions/deodorants the morning of surgery.  Please wear clean clothes to the hospital/surgery center.  FAILURE TO FOLLOW THESE INSTRUCTIONS MAY  RESULT IN THE CANCELLATION OF YOUR SURGERY PATIENT SIGNATURE_________________________________  NURSE SIGNATURE__________________________________  ________________________________________________________________________   Adam Phenix  An incentive spirometer is a tool that can help keep your lungs clear and active. This tool measures how well you are filling your lungs with each breath. Taking long deep breaths may help reverse or decrease the chance of developing breathing (pulmonary) problems (especially infection)  following:  A long period of time when you are unable to move or be active. BEFORE THE PROCEDURE   If the spirometer includes an indicator to show your best effort, your nurse or respiratory therapist will set it to a desired goal.  If possible, sit up straight or lean slightly forward. Try not to slouch.  Hold the incentive spirometer in an upright position. INSTRUCTIONS FOR USE  1. Sit on the edge of your bed if possible, or sit up as far as you can in bed or on a chair. 2. Hold the incentive spirometer in an upright position. 3. Breathe out normally. 4. Place the mouthpiece in your mouth and seal your lips tightly around it. 5. Breathe in slowly and as deeply as possible, raising the piston or the ball toward the top of the column. 6. Hold your breath for 3-5 seconds or for as long as possible. Allow the piston or ball to fall to the bottom of the column. 7. Remove the mouthpiece from your mouth and breathe out normally. 8. Rest for a few seconds and repeat Steps 1 through 7 at least 10 times every 1-2 hours when you are awake. Take your time and take a few normal breaths between deep breaths. 9. The spirometer may include an indicator to show your best effort. Use the indicator as a goal to work toward during each repetition. 10. After each set of 10 deep breaths, practice coughing to be sure your lungs are clear. If you have an incision (the cut made at the  time of surgery), support your incision when coughing by placing a pillow or rolled up towels firmly against it. Once you are able to get out of bed, walk around indoors and cough well. You may stop using the incentive spirometer when instructed by your caregiver.  RISKS AND COMPLICATIONS  Take your time so you do not get dizzy or light-headed.  If you are in pain, you may need to take or ask for pain medication before doing incentive spirometry. It is harder to take a deep breath if you are having pain. AFTER USE  Rest and breathe slowly and easily.  It can be helpful to keep track of a log of your progress. Your caregiver can provide you with a simple table to help with this. If you are using the spirometer at home, follow these instructions: [place] IF:   You are having difficultly using the spirometer.  You have trouble using the spirometer as often as instructed.  Your pain medication is not giving enough relief while using the spirometer.  You develop fever of 100.5 F (38.1 C) or higher. SEEK IMMEDIATE MEDICAL CARE IF:   You cough up bloody sputum that had not been present before.  You develop fever of 102 F (38.9 C) or greater.  You develop worsening pain at or near the incision site. MAKE SURE YOU:   Understand these instructions.  Will watch your condition.  Will get help right away if you are not doing well or get worse. Document Released: 04/18/2007 Document Revised: 02/28/2012 Document Reviewed: 06/19/2007 ExitCare Patient Information 2014 ExitCare, [place].   ________________________________________________________________________  WHAT IS A BLOOD TRANSFUSION? Blood Transfusion Information  A transfusion is the replacement of blood or some of its parts. Blood is made up of multiple cells which provide different functions.  Red blood cells carry oxygen and are used for blood loss replacement.  White blood cells fight against  infection.  Platelets control  bleeding.  Plasma helps clot blood.  Other blood products are available for specialized needs, such as hemophilia or other clotting disorders. BEFORE THE TRANSFUSION  Who gives blood for transfusions?   Healthy volunteers who are fully evaluated to make sure their blood is safe. This is blood bank blood. Transfusion therapy is the safest it has ever been in the practice of medicine. Before blood is taken from a donor, a complete history is taken to make sure that person has no history of diseases nor engages in risky social behavior (examples are intravenous drug use or sexual activity with multiple partners). The donor's travel history is screened to minimize risk of transmitting infections, such as malaria. The donated blood is tested for signs of infectious diseases, such as HIV and hepatitis. The blood is then tested to be sure it is compatible with you in order to minimize the chance of a transfusion reaction. If you or a relative donates blood, this is often done in anticipation of surgery and is not appropriate for emergency situations. It takes many days to process the donated blood. RISKS AND COMPLICATIONS Although transfusion therapy is very safe and saves many lives, the main dangers of transfusion include:   Getting an infectious disease.  Developing a transfusion reaction. This is an allergic reaction to something in the blood you were given. Every precaution is taken to prevent this. The decision to have a blood transfusion has been considered carefully by your caregiver before blood is given. Blood is not given unless the benefits outweigh the risks. AFTER THE TRANSFUSION  Right after receiving a blood transfusion, you will usually feel much better and more energetic. This is especially true if your red blood cells have gotten low (anemic). The transfusion raises the level of the red blood cells which carry oxygen, and this usually causes an energy  increase.  The nurse administering the transfusion will monitor you carefully for complications. HOME CARE INSTRUCTIONS  No special instructions are needed after a transfusion. You may find your energy is better. Speak with your caregiver about any limitations on activity for underlying diseases you may have. SEEK MEDICAL CARE IF:   Your condition is not improving after your transfusion.  You develop redness or irritation at the intravenous (IV) site. SEEK IMMEDIATE MEDICAL CARE IF:  Any of the following symptoms occur over the next 12 hours:  Shaking chills.  You have a temperature by mouth above 102 F (38.9 C), not controlled by medicine.  Chest, back, or muscle pain.  People around you feel you are not acting correctly or are confused.  Shortness of breath or difficulty breathing.  Dizziness and fainting.  You get a rash or develop hives.  You have a decrease in urine output.  Your urine turns a dark color or changes to pink, red, or brown. Any of the following symptoms occur over the next 10 days:  You have a temperature by mouth above 102 F (38.9 C), not controlled by medicine.  Shortness of breath.  Weakness after normal activity.  The white part of the eye turns yellow (jaundice).  You have a decrease in the amount of urine or are urinating less often.  Your urine turns a dark color or changes to pink, red, or brown. Document Released: 12/03/2000 Document Revised: 02/28/2012 Document Reviewed: 07/22/2008 [facility] Patient Information 2014 [place], [place].  _______________________________________________________________________

## 2020-03-06 ENCOUNTER — Encounter (HOSPITAL_COMMUNITY)
Admission: RE | Admit: 2020-03-06 | Discharge: 2020-03-06 | Disposition: A | Discharge status: Home / Self Care | Payer: Medicare HMO | Source: Ambulatory Visit | Attending: Orthopedic Surgery | Admitting: Orthopedic Surgery

## 2020-03-06 ENCOUNTER — Other Ambulatory Visit: Payer: Self-pay

## 2020-03-06 DIAGNOSIS — Z20822 Contact with and (suspected) exposure to covid-19: Secondary | ICD-10-CM | POA: Diagnosis not present

## 2020-03-06 DIAGNOSIS — Z01818 Encounter for other preprocedural examination: Secondary | ICD-10-CM | POA: Diagnosis not present

## 2020-03-06 LAB — BASIC METABOLIC PANEL
Anion gap: 10 (ref 5–15)
BUN: 7 mg/dL — ABNORMAL LOW (ref 8–23)
CO2: 26 mmol/L (ref 22–32)
Calcium: 9.5 mg/dL (ref 8.9–10.3)
Chloride: 96 mmol/L — ABNORMAL LOW (ref 98–111)
Creatinine, Ser: 0.51 mg/dL (ref 0.44–1.00)
GFR calc Af Amer: >60 mL/min (ref >60)
GFR calc non Af Amer: >60 mL/min (ref >60)
Glucose, Bld: 97 mg/dL (ref 70–99)
Potassium: 4 mmol/L (ref 3.5–5.1)
Sodium: 132 mmol/L — ABNORMAL LOW (ref 135–145)

## 2020-03-06 LAB — CBC
HCT: 38.5 % (ref 36.0–46.0)
Hemoglobin: 12.3 g/dL (ref 12.0–15.0)
MCH: 30.1 pg (ref 26.0–34.0)
MCHC: 31.9 g/dL (ref 30.0–36.0)
MCV: 94.4 fL (ref 80.0–100.0)
Platelets: 302 10*3/uL (ref 150–400)
RBC: 4.08 MIL/uL (ref 3.87–5.11)
RDW: 12.9 % (ref 11.5–15.5)
WBC: 6.7 10*3/uL (ref 4.0–10.5)
nRBC: 0 % (ref 0.0–0.2)

## 2020-03-06 LAB — SURGICAL PCR SCREEN
MRSA, PCR: NEGATIVE
Staphylococcus aureus: NEGATIVE

## 2020-03-07 ENCOUNTER — Other Ambulatory Visit (HOSPITAL_COMMUNITY)
Admission: RE | Admit: 2020-03-07 | Discharge: 2020-03-07 | Disposition: A | Discharge status: Home / Self Care | Payer: Medicare HMO | Source: Ambulatory Visit | Attending: Orthopedic Surgery | Admitting: Orthopedic Surgery

## 2020-03-07 DIAGNOSIS — Z20822 Contact with and (suspected) exposure to covid-19: Secondary | ICD-10-CM | POA: Insufficient documentation

## 2020-03-07 DIAGNOSIS — Z01812 Encounter for preprocedural laboratory examination: Secondary | ICD-10-CM | POA: Insufficient documentation

## 2020-03-07 LAB — SARS CORONAVIRUS 2 (TAT 6-24 HRS): SARS Coronavirus 2: NEGATIVE

## 2020-03-11 ENCOUNTER — Ambulatory Visit (HOSPITAL_COMMUNITY): Payer: Medicare HMO | Admitting: Certified Registered Nurse Anesthetist

## 2020-03-11 ENCOUNTER — Other Ambulatory Visit: Payer: Self-pay

## 2020-03-11 ENCOUNTER — Inpatient Hospital Stay (HOSPITAL_COMMUNITY)
Admission: RE | Admit: 2020-03-11 | Discharge: 2020-03-14 | DRG: 470 | Disposition: A | Discharge status: Home Health | Payer: Medicare HMO | Attending: Orthopedic Surgery | Admitting: Orthopedic Surgery

## 2020-03-11 ENCOUNTER — Encounter (HOSPITAL_COMMUNITY)
Admission: RE | Disposition: A | Discharge status: Home Health | Payer: Self-pay | Source: Home / Self Care | Attending: Orthopedic Surgery

## 2020-03-11 ENCOUNTER — Encounter (HOSPITAL_COMMUNITY): Payer: Self-pay | Admitting: Orthopedic Surgery

## 2020-03-11 DIAGNOSIS — M1712 Unilateral primary osteoarthritis, left knee: Principal | ICD-10-CM | POA: Diagnosis present

## 2020-03-11 DIAGNOSIS — M797 Fibromyalgia: Secondary | ICD-10-CM | POA: Diagnosis present

## 2020-03-11 DIAGNOSIS — G8918 Other acute postprocedural pain: Secondary | ICD-10-CM | POA: Diagnosis not present

## 2020-03-11 DIAGNOSIS — M659 Synovitis and tenosynovitis, unspecified: Secondary | ICD-10-CM | POA: Diagnosis not present

## 2020-03-11 DIAGNOSIS — M810 Age-related osteoporosis without current pathological fracture: Secondary | ICD-10-CM | POA: Diagnosis present

## 2020-03-11 DIAGNOSIS — Z96652 Presence of left artificial knee joint: Secondary | ICD-10-CM

## 2020-03-11 DIAGNOSIS — I10 Essential (primary) hypertension: Secondary | ICD-10-CM | POA: Diagnosis not present

## 2020-03-11 DIAGNOSIS — M25462 Effusion, left knee: Secondary | ICD-10-CM | POA: Diagnosis present

## 2020-03-11 DIAGNOSIS — E785 Hyperlipidemia, unspecified: Secondary | ICD-10-CM | POA: Diagnosis not present

## 2020-03-11 DIAGNOSIS — Z823 Family history of stroke: Secondary | ICD-10-CM

## 2020-03-11 DIAGNOSIS — K219 Gastro-esophageal reflux disease without esophagitis: Secondary | ICD-10-CM | POA: Diagnosis not present

## 2020-03-11 DIAGNOSIS — H409 Unspecified glaucoma: Secondary | ICD-10-CM | POA: Diagnosis present

## 2020-03-11 DIAGNOSIS — M25762 Osteophyte, left knee: Secondary | ICD-10-CM | POA: Diagnosis present

## 2020-03-11 DIAGNOSIS — Z8673 Personal history of transient ischemic attack (TIA), and cerebral infarction without residual deficits: Secondary | ICD-10-CM

## 2020-03-11 DIAGNOSIS — Z79899 Other long term (current) drug therapy: Secondary | ICD-10-CM

## 2020-03-11 DIAGNOSIS — Z885 Allergy status to narcotic agent status: Secondary | ICD-10-CM

## 2020-03-11 DIAGNOSIS — Z8249 Family history of ischemic heart disease and other diseases of the circulatory system: Secondary | ICD-10-CM

## 2020-03-11 HISTORY — PX: TOTAL KNEE ARTHROPLASTY: SHX125

## 2020-03-11 LAB — TYPE AND SCREEN
ABO/RH(D): O NEG
Antibody Screen: NEGATIVE

## 2020-03-11 SURGERY — ARTHROPLASTY, KNEE, TOTAL
Anesthesia: Spinal | Site: Knee | Laterality: Left

## 2020-03-11 MED ORDER — SODIUM CHLORIDE 0.9 % IR SOLN
Status: DC | PRN
  Administered 2020-03-11: 1000 mL

## 2020-03-11 MED ORDER — TRANEXAMIC ACID-NACL 1000-0.7 MG/100ML-% IV SOLN
1000.0000 mg | INTRAVENOUS | Status: AC
  Administered 2020-03-11: 1000 mg via INTRAVENOUS
  Filled 2020-03-11: qty 100

## 2020-03-11 MED ORDER — NON FORMULARY: 20.0000 mg | Freq: Every day | Status: DC

## 2020-03-11 MED ORDER — ONDANSETRON HCL 4 MG PO TABS: 4.0000 mg | ORAL_TABLET | Freq: Four times a day (QID) | ORAL | Status: DC | PRN

## 2020-03-11 MED ORDER — STERILE WATER FOR IRRIGATION IR SOLN
Status: DC | PRN
  Administered 2020-03-11: 2000 mL

## 2020-03-11 MED ORDER — DIPHENHYDRAMINE HCL 12.5 MG/5ML PO ELIX: 12.5000 mg | ORAL_SOLUTION | ORAL | Status: DC | PRN

## 2020-03-11 MED ORDER — PREDNISOLONE ACETATE 1 % OP SUSP
1.0000 [drp] | OPHTHALMIC | Status: DC
  Administered 2020-03-14: 1 [drp] via OPHTHALMIC
  Filled 2020-03-11: qty 5

## 2020-03-11 MED ORDER — METHOCARBAMOL 500 MG PO TABS
500.0000 mg | ORAL_TABLET | Freq: Four times a day (QID) | ORAL | Status: DC | PRN
  Administered 2020-03-11 – 2020-03-14 (×7): 500 mg via ORAL
  Filled 2020-03-11 (×7): qty 1

## 2020-03-11 MED ORDER — BUPIVACAINE HCL (PF) 0.5 % IJ SOLN
INTRAMUSCULAR | Status: DC | PRN
  Administered 2020-03-11: 20 mL via PERINEURAL

## 2020-03-11 MED ORDER — DEXAMETHASONE SODIUM PHOSPHATE 10 MG/ML IJ SOLN
10.0000 mg | Freq: Once | INTRAMUSCULAR | Status: AC
  Administered 2020-03-12: 10 mg via INTRAVENOUS
  Filled 2020-03-11: qty 1

## 2020-03-11 MED ORDER — MAGNESIUM CITRATE PO SOLN: 1.0000 | Freq: Once | ORAL | Status: DC | PRN

## 2020-03-11 MED ORDER — DOCUSATE SODIUM 100 MG PO CAPS
100.0000 mg | ORAL_CAPSULE | Freq: Two times a day (BID) | ORAL | Status: DC
  Administered 2020-03-11 – 2020-03-14 (×6): 100 mg via ORAL
  Filled 2020-03-11 (×6): qty 1

## 2020-03-11 MED ORDER — METHOCARBAMOL 500 MG IVPB - SIMPLE MED
500.0000 mg | Freq: Four times a day (QID) | INTRAVENOUS | Status: DC | PRN
  Administered 2020-03-11: 500 mg via INTRAVENOUS
  Filled 2020-03-11: qty 50

## 2020-03-11 MED ORDER — CELECOXIB 200 MG PO CAPS
200.0000 mg | ORAL_CAPSULE | Freq: Two times a day (BID) | ORAL | Status: DC
  Administered 2020-03-11 – 2020-03-14 (×4): 200 mg via ORAL
  Filled 2020-03-11 (×5): qty 1

## 2020-03-11 MED ORDER — HYDROCHLOROTHIAZIDE 25 MG PO TABS
25.0000 mg | ORAL_TABLET | ORAL | Status: DC
  Administered 2020-03-12: 25 mg via ORAL
  Filled 2020-03-11 (×2): qty 1

## 2020-03-11 MED ORDER — SODIUM CHLORIDE (PF) 0.9 % IJ SOLN
INTRAMUSCULAR | Status: AC
  Filled 2020-03-11: qty 50

## 2020-03-11 MED ORDER — HYDROCODONE-ACETAMINOPHEN 5-325 MG PO TABS
1.0000 | ORAL_TABLET | ORAL | Status: DC | PRN
  Administered 2020-03-11 – 2020-03-13 (×6): 2 via ORAL
  Administered 2020-03-14: 1 via ORAL
  Filled 2020-03-11 (×8): qty 2

## 2020-03-11 MED ORDER — DEXAMETHASONE SODIUM PHOSPHATE 10 MG/ML IJ SOLN
INTRAMUSCULAR | Status: AC
  Filled 2020-03-11: qty 1

## 2020-03-11 MED ORDER — POVIDONE-IODINE 10 % EX SWAB: 2.0000 "application " | Freq: Once | CUTANEOUS | Status: DC

## 2020-03-11 MED ORDER — HYDROMORPHONE HCL 1 MG/ML IJ SOLN
0.5000 mg | INTRAMUSCULAR | Status: DC | PRN
  Administered 2020-03-11: 1 mg via INTRAVENOUS
  Filled 2020-03-11: qty 1

## 2020-03-11 MED ORDER — GLYCOPYRROLATE 0.2 MG/ML IJ SOLN
INTRAMUSCULAR | Status: DC | PRN
  Administered 2020-03-11: .1 mg via INTRAVENOUS

## 2020-03-11 MED ORDER — ONDANSETRON HCL 4 MG/2ML IJ SOLN
INTRAMUSCULAR | Status: AC
  Filled 2020-03-11: qty 2

## 2020-03-11 MED ORDER — LORATADINE 10 MG PO TABS
10.0000 mg | ORAL_TABLET | ORAL | Status: DC
  Administered 2020-03-12 – 2020-03-14 (×2): 10 mg via ORAL
  Filled 2020-03-11 (×2): qty 1

## 2020-03-11 MED ORDER — SODIUM CHLORIDE (PF) 0.9 % IJ SOLN
INTRAMUSCULAR | Status: DC | PRN
  Administered 2020-03-11: 50 mL

## 2020-03-11 MED ORDER — ALUM & MAG HYDROXIDE-SIMETH 200-200-20 MG/5ML PO SUSP: 15.0000 mL | ORAL | Status: DC | PRN

## 2020-03-11 MED ORDER — FERROUS SULFATE 325 (65 FE) MG PO TABS
325.0000 mg | ORAL_TABLET | Freq: Two times a day (BID) | ORAL | Status: DC
  Administered 2020-03-12 – 2020-03-14 (×5): 325 mg via ORAL
  Filled 2020-03-11 (×5): qty 1

## 2020-03-11 MED ORDER — LACTATED RINGERS IV SOLN: INTRAVENOUS | Status: DC

## 2020-03-11 MED ORDER — CEFAZOLIN SODIUM-DEXTROSE 2-4 GM/100ML-% IV SOLN
2.0000 g | Freq: Four times a day (QID) | INTRAVENOUS | Status: AC
  Administered 2020-03-11 (×2): 2 g via INTRAVENOUS
  Filled 2020-03-11 (×2): qty 100

## 2020-03-11 MED ORDER — METOCLOPRAMIDE HCL 5 MG/ML IJ SOLN: 5.0000 mg | Freq: Three times a day (TID) | INTRAMUSCULAR | Status: DC | PRN

## 2020-03-11 MED ORDER — FLUTICASONE PROPIONATE 50 MCG/ACT NA SUSP
1.0000 | NASAL | Status: DC
  Filled 2020-03-11: qty 16

## 2020-03-11 MED ORDER — DEXAMETHASONE SODIUM PHOSPHATE 10 MG/ML IJ SOLN: 10.0000 mg | Freq: Once | INTRAMUSCULAR | Status: DC

## 2020-03-11 MED ORDER — DEXAMETHASONE SODIUM PHOSPHATE 10 MG/ML IJ SOLN
INTRAMUSCULAR | Status: DC | PRN
  Administered 2020-03-11: 10 mg via INTRAVENOUS

## 2020-03-11 MED ORDER — ATORVASTATIN CALCIUM 20 MG PO TABS
20.0000 mg | ORAL_TABLET | Freq: Every day | ORAL | Status: DC
  Administered 2020-03-11 – 2020-03-14 (×4): 20 mg via ORAL
  Filled 2020-03-11 (×4): qty 1

## 2020-03-11 MED ORDER — CALCIUM CARBONATE ANTACID 500 MG PO CHEW: 1.0000 | CHEWABLE_TABLET | Freq: Every day | ORAL | Status: DC | PRN

## 2020-03-11 MED ORDER — POLYETHYLENE GLYCOL 3350 17 G PO PACK
17.0000 g | PACK | Freq: Two times a day (BID) | ORAL | Status: DC
  Administered 2020-03-11 – 2020-03-14 (×4): 17 g via ORAL
  Filled 2020-03-11 (×5): qty 1

## 2020-03-11 MED ORDER — METHOCARBAMOL 500 MG IVPB - SIMPLE MED
INTRAVENOUS | Status: AC
  Filled 2020-03-11: qty 50

## 2020-03-11 MED ORDER — TRANEXAMIC ACID-NACL 1000-0.7 MG/100ML-% IV SOLN
1000.0000 mg | Freq: Once | INTRAVENOUS | Status: AC
  Administered 2020-03-11: 1000 mg via INTRAVENOUS
  Filled 2020-03-11: qty 100

## 2020-03-11 MED ORDER — PROMETHAZINE HCL 25 MG/ML IJ SOLN: 6.2500 mg | INTRAMUSCULAR | Status: DC | PRN

## 2020-03-11 MED ORDER — MENTHOL 3 MG MT LOZG: 1.0000 | LOZENGE | OROMUCOSAL | Status: DC | PRN

## 2020-03-11 MED ORDER — LIDOCAINE 2% (20 MG/ML) 5 ML SYRINGE
INTRAMUSCULAR | Status: AC
  Filled 2020-03-11: qty 5

## 2020-03-11 MED ORDER — LATANOPROST 0.005 % OP SOLN
1.0000 [drp] | Freq: Every day | OPHTHALMIC | Status: DC
  Administered 2020-03-11 – 2020-03-13 (×3): 1 [drp] via OPHTHALMIC
  Filled 2020-03-11: qty 2.5

## 2020-03-11 MED ORDER — CHLORHEXIDINE GLUCONATE 4 % EX LIQD: 60.0000 mL | Freq: Once | CUTANEOUS | Status: DC

## 2020-03-11 MED ORDER — METOCLOPRAMIDE HCL 5 MG PO TABS: 5.0000 mg | ORAL_TABLET | Freq: Three times a day (TID) | ORAL | Status: DC | PRN

## 2020-03-11 MED ORDER — BISACODYL 10 MG RE SUPP: 10.0000 mg | Freq: Every day | RECTAL | Status: DC | PRN

## 2020-03-11 MED ORDER — ONDANSETRON HCL 4 MG/2ML IJ SOLN: 4.0000 mg | Freq: Four times a day (QID) | INTRAMUSCULAR | Status: DC | PRN

## 2020-03-11 MED ORDER — PHENYLEPHRINE HCL-NACL 10-0.9 MG/250ML-% IV SOLN
INTRAVENOUS | Status: DC | PRN
  Administered 2020-03-11: 50 ug/min via INTRAVENOUS

## 2020-03-11 MED ORDER — LIDOCAINE HCL (CARDIAC) PF 100 MG/5ML IV SOSY
PREFILLED_SYRINGE | INTRAVENOUS | Status: DC | PRN
  Administered 2020-03-11: 80 mg via INTRAVENOUS

## 2020-03-11 MED ORDER — ONDANSETRON HCL 4 MG/2ML IJ SOLN
INTRAMUSCULAR | Status: DC | PRN
  Administered 2020-03-11: 4 mg via INTRAVENOUS

## 2020-03-11 MED ORDER — PHENYLEPHRINE HCL (PRESSORS) 10 MG/ML IV SOLN
INTRAVENOUS | Status: AC
  Filled 2020-03-11: qty 1

## 2020-03-11 MED ORDER — KETOROLAC TROMETHAMINE 30 MG/ML IJ SOLN
INTRAMUSCULAR | Status: DC | PRN
  Administered 2020-03-11: 30 mg

## 2020-03-11 MED ORDER — HYDROCODONE-ACETAMINOPHEN 7.5-325 MG PO TABS
1.0000 | ORAL_TABLET | ORAL | Status: DC | PRN
  Administered 2020-03-12 (×2): 2 via ORAL
  Filled 2020-03-11 (×2): qty 2

## 2020-03-11 MED ORDER — KETOROLAC TROMETHAMINE 30 MG/ML IJ SOLN
INTRAMUSCULAR | Status: AC
  Filled 2020-03-11: qty 1

## 2020-03-11 MED ORDER — DENOSUMAB 60 MG/ML ~~LOC~~ SOSY: 60.0000 mg | PREFILLED_SYRINGE | Freq: Once | SUBCUTANEOUS | Status: DC

## 2020-03-11 MED ORDER — BUPIVACAINE HCL 0.25 % IJ SOLN
INTRAMUSCULAR | Status: AC
  Filled 2020-03-11: qty 1

## 2020-03-11 MED ORDER — BUPIVACAINE HCL (PF) 0.25 % IJ SOLN
INTRAMUSCULAR | Status: DC | PRN
  Administered 2020-03-11: 30 mL

## 2020-03-11 MED ORDER — HYDROMORPHONE HCL 1 MG/ML IJ SOLN
INTRAMUSCULAR | Status: AC
  Filled 2020-03-11: qty 1

## 2020-03-11 MED ORDER — CLONIDINE HCL (ANALGESIA) 100 MCG/ML EP SOLN
EPIDURAL | Status: DC | PRN
  Administered 2020-03-11: 100 ug

## 2020-03-11 MED ORDER — MIDAZOLAM HCL 2 MG/2ML IJ SOLN
1.0000 mg | INTRAMUSCULAR | Status: DC
  Filled 2020-03-11: qty 2

## 2020-03-11 MED ORDER — PHENOL 1.4 % MT LIQD: 1.0000 | OROMUCOSAL | Status: DC | PRN

## 2020-03-11 MED ORDER — EPHEDRINE SULFATE-NACL 50-0.9 MG/10ML-% IV SOSY
PREFILLED_SYRINGE | INTRAVENOUS | Status: DC | PRN
  Administered 2020-03-11 (×2): 5 mg via INTRAVENOUS

## 2020-03-11 MED ORDER — ACETAMINOPHEN 325 MG PO TABS
325.0000 mg | ORAL_TABLET | Freq: Four times a day (QID) | ORAL | Status: DC | PRN
  Administered 2020-03-14: 650 mg via ORAL
  Filled 2020-03-11: qty 2

## 2020-03-11 MED ORDER — CALCIUM CARBONATE ANTACID 750 MG PO CHEW: 1.0000 | CHEWABLE_TABLET | Freq: Every day | ORAL | Status: DC | PRN

## 2020-03-11 MED ORDER — SODIUM CHLORIDE 0.9 % IV SOLN: INTRAVENOUS | Status: DC

## 2020-03-11 MED ORDER — PROPOFOL 500 MG/50ML IV EMUL
INTRAVENOUS | Status: DC | PRN
  Administered 2020-03-11: 60 ug/kg/min via INTRAVENOUS

## 2020-03-11 MED ORDER — RIVAROXABAN 10 MG PO TABS
10.0000 mg | ORAL_TABLET | ORAL | Status: DC
  Administered 2020-03-12 – 2020-03-14 (×3): 10 mg via ORAL
  Filled 2020-03-11 (×3): qty 1

## 2020-03-11 MED ORDER — HYDROMORPHONE HCL 1 MG/ML IJ SOLN
0.2500 mg | INTRAMUSCULAR | Status: DC | PRN
  Administered 2020-03-11: 0.5 mg via INTRAVENOUS

## 2020-03-11 MED ORDER — BUPIVACAINE IN DEXTROSE 0.75-8.25 % IT SOLN
INTRATHECAL | Status: DC | PRN
  Administered 2020-03-11: 1.2 mL via INTRATHECAL

## 2020-03-11 MED ORDER — PROPOFOL 1000 MG/100ML IV EMUL
INTRAVENOUS | Status: AC
  Filled 2020-03-11: qty 200

## 2020-03-11 MED ORDER — DORZOLAMIDE HCL-TIMOLOL MAL 2-0.5 % OP SOLN
1.0000 [drp] | Freq: Two times a day (BID) | OPHTHALMIC | Status: DC
  Administered 2020-03-11 – 2020-03-14 (×6): 1 [drp] via OPHTHALMIC
  Filled 2020-03-11: qty 10

## 2020-03-11 MED ORDER — FENTANYL CITRATE (PF) 100 MCG/2ML IJ SOLN
50.0000 ug | INTRAMUSCULAR | Status: DC
  Administered 2020-03-11: 75 ug via INTRAVENOUS
  Filled 2020-03-11: qty 2

## 2020-03-11 MED ORDER — OMEPRAZOLE 20 MG PO CPDR
20.0000 mg | DELAYED_RELEASE_CAPSULE | Freq: Every day | ORAL | Status: DC
  Administered 2020-03-12 – 2020-03-14 (×3): 20 mg via ORAL
  Filled 2020-03-11 (×3): qty 1

## 2020-03-11 MED ORDER — AMITRIPTYLINE HCL 25 MG PO TABS
25.0000 mg | ORAL_TABLET | Freq: Every evening | ORAL | Status: DC | PRN
  Filled 2020-03-11: qty 1

## 2020-03-11 MED ORDER — CEFAZOLIN SODIUM-DEXTROSE 2-4 GM/100ML-% IV SOLN
2.0000 g | INTRAVENOUS | Status: AC
  Administered 2020-03-11: 2 g via INTRAVENOUS
  Filled 2020-03-11: qty 100

## 2020-03-11 SURGICAL SUPPLY — 63 items
ADH SKN CLS APL DERMABOND .7 (GAUZE/BANDAGES/DRESSINGS) ×1
ATTUNE MED ANAT PAT 35 KNEE (Knees) ×1 IMPLANT
ATTUNE PS FEM LT SZ 4 CEM KNEE (Femur) ×1 IMPLANT
ATTUNE PSRP INSR SZ4 6 KNEE (Insert) ×1 IMPLANT
BAG SPEC THK2 15X12 ZIP CLS (MISCELLANEOUS)
BAG ZIPLOCK 12X15 (MISCELLANEOUS) IMPLANT
BASE TIBIAL ROT PLAT SZ 3 KNEE (Knees) IMPLANT
BLADE SAW SGTL 11.0X1.19X90.0M (BLADE) IMPLANT
BLADE SAW SGTL 13.0X1.19X90.0M (BLADE) ×2 IMPLANT
BLADE SURG SZ10 CARB STEEL (BLADE) ×4 IMPLANT
BNDG ELASTIC 6X5.8 VLCR STR LF (GAUZE/BANDAGES/DRESSINGS) ×2 IMPLANT
BOWL SMART MIX CTS (DISPOSABLE) ×2 IMPLANT
BSPLAT TIB 3 CMNT ROT PLAT STR (Knees) ×1 IMPLANT
CEMENT HV SMART SET (Cement) ×2 IMPLANT
COVER SURGICAL LIGHT HANDLE (MISCELLANEOUS) ×2 IMPLANT
COVER WAND RF STERILE (DRAPES) IMPLANT
CUFF TOURN SGL QUICK 34 (TOURNIQUET CUFF) ×2
CUFF TRNQT CYL 34X4.125X (TOURNIQUET CUFF) ×1 IMPLANT
DECANTER SPIKE VIAL GLASS SM (MISCELLANEOUS) ×4 IMPLANT
DERMABOND ADVANCED (GAUZE/BANDAGES/DRESSINGS) ×1
DERMABOND ADVANCED .7 DNX12 (GAUZE/BANDAGES/DRESSINGS) ×1 IMPLANT
DRAPE U-SHAPE 47X51 STRL (DRAPES) ×2 IMPLANT
DRESSING AQUACEL AG SP 3.5X10 (GAUZE/BANDAGES/DRESSINGS) ×1 IMPLANT
DRSG AQUACEL AG SP 3.5X10 (GAUZE/BANDAGES/DRESSINGS) ×2
DURAPREP 26ML APPLICATOR (WOUND CARE) ×4 IMPLANT
ELECT REM PT RETURN 15FT ADLT (MISCELLANEOUS) ×2 IMPLANT
GLOVE BIO SURGEON STRL SZ 6 (GLOVE) ×2 IMPLANT
GLOVE BIOGEL PI IND STRL 6.5 (GLOVE) ×1 IMPLANT
GLOVE BIOGEL PI IND STRL 7.5 (GLOVE) ×1 IMPLANT
GLOVE BIOGEL PI IND STRL 8.5 (GLOVE) ×1 IMPLANT
GLOVE BIOGEL PI INDICATOR 6.5 (GLOVE) ×1
GLOVE BIOGEL PI INDICATOR 7.5 (GLOVE) ×1
GLOVE BIOGEL PI INDICATOR 8.5 (GLOVE)
GLOVE ECLIPSE 8.0 STRL XLNG CF (GLOVE) ×1 IMPLANT
GLOVE ORTHO TXT STRL SZ7.5 (GLOVE) ×2 IMPLANT
GOWN STRL REUS W/ TWL LRG LVL3 (GOWN DISPOSABLE) ×1 IMPLANT
GOWN STRL REUS W/TWL 2XL LVL3 (GOWN DISPOSABLE) ×2 IMPLANT
GOWN STRL REUS W/TWL LRG LVL3 (GOWN DISPOSABLE) ×4 IMPLANT
HANDPIECE INTERPULSE COAX TIP (DISPOSABLE) ×2
HOLDER FOLEY CATH W/STRAP (MISCELLANEOUS) IMPLANT
KIT TURNOVER KIT A (KITS) IMPLANT
MANIFOLD NEPTUNE II (INSTRUMENTS) ×2 IMPLANT
NDL SAFETY ECLIPSE 18X1.5 (NEEDLE) IMPLANT
NEEDLE HYPO 18GX1.5 SHARP (NEEDLE)
NS IRRIG 1000ML POUR BTL (IV SOLUTION) ×2 IMPLANT
PACK TOTAL KNEE CUSTOM (KITS) ×2 IMPLANT
PENCIL SMOKE EVACUATOR (MISCELLANEOUS) ×1 IMPLANT
PIN DRILL FIX HALF THREAD (BIT) ×1 IMPLANT
PIN FIX SIGMA LCS THRD HI (PIN) ×1 IMPLANT
PROTECTOR NERVE ULNAR (MISCELLANEOUS) ×2 IMPLANT
SET HNDPC FAN SPRY TIP SCT (DISPOSABLE) ×1 IMPLANT
SET PAD KNEE POSITIONER (MISCELLANEOUS) ×2 IMPLANT
SUT MNCRL AB 4-0 PS2 18 (SUTURE) ×2 IMPLANT
SUT STRATAFIX PDS+ 0 24IN (SUTURE) ×2 IMPLANT
SUT VIC AB 1 CT1 36 (SUTURE) ×2 IMPLANT
SUT VIC AB 2-0 CT1 27 (SUTURE) ×6
SUT VIC AB 2-0 CT1 TAPERPNT 27 (SUTURE) ×3 IMPLANT
SYR 3ML LL SCALE MARK (SYRINGE) ×2 IMPLANT
TIBIAL BASE ROT PLAT SZ 3 KNEE (Knees) ×2 IMPLANT
TRAY FOLEY MTR SLVR 14FR STAT (SET/KITS/TRAYS/PACK) ×1 IMPLANT
WATER STERILE IRR 1000ML POUR (IV SOLUTION) ×4 IMPLANT
WRAP KNEE MAXI GEL POST OP (GAUZE/BANDAGES/DRESSINGS) ×2 IMPLANT
YANKAUER SUCT BULB TIP 10FT TU (MISCELLANEOUS) ×2 IMPLANT

## 2020-03-11 NOTE — Anesthesia Procedure Notes (Signed)
Spinal  Patient location during procedure: OR Start time: 03/11/2020 9:57 AM End time: 03/11/2020 10:02 AM Staffing Anesthesiologist: Myrtie Soman, MD Preanesthetic Checklist Completed: patient identified, IV checked, site marked, risks and benefits discussed, surgical consent, monitors and equipment checked, pre-op evaluation and timeout performed Spinal Block Patient position: sitting Prep: DuraPrep Patient monitoring: heart rate, cardiac monitor, continuous pulse ox and blood pressure Approach: midline Location: L3-4 Injection technique: single-shot Needle Needle type: Sprotte  Needle gauge: 24 G Needle length: 9 cm Assessment Sensory level: T6

## 2020-03-11 NOTE — Progress Notes (Signed)
Assisted Dr. Rose with left, ultrasound guided, adductor canal block. Side rails up, monitors on throughout procedure. See vital signs in flow sheet. Tolerated Procedure well.  

## 2020-03-11 NOTE — Anesthesia Procedure Notes (Signed)
Anesthesia Procedure Image    

## 2020-03-11 NOTE — Interval H&P Note (Signed)
History and Physical Interval Note:  03/11/2020 8:44 AM  Terri Shah  has presented today for surgery, with the diagnosis of Left knee osteoarthritis.  The various methods of treatment have been discussed with the patient and family. After consideration of risks, benefits and other options for treatment, the patient has consented to  Procedure(s) with comments: TOTAL KNEE ARTHROPLASTY (Left) - 70 mins as a surgical intervention.  The patient's history has been reviewed, patient examined, no change in status, stable for surgery.  I have reviewed the patient's chart and labs.  Questions were answered to the patient's satisfaction.     Mauri Pole

## 2020-03-11 NOTE — Anesthesia Preprocedure Evaluation (Signed)
Anesthesia Evaluation  Patient identified by MRN, date of birth, ID band Patient awake    Reviewed: Allergy & Precautions, NPO status , Patient's Chart, lab work & pertinent test results  Airway Mallampati: II  TM Distance: >3 FB Neck ROM: Full    Dental no notable dental hx.    Pulmonary neg pulmonary ROS,    Pulmonary exam normal breath sounds clear to auscultation       Cardiovascular hypertension, Normal cardiovascular exam Rhythm:Regular Rate:Normal     Neuro/Psych CVA negative psych ROS   GI/Hepatic Neg liver ROS, GERD  ,  Endo/Other  negative endocrine ROS  Renal/GU negative Renal ROS  negative genitourinary   Musculoskeletal  (+) Arthritis ,   Abdominal   Peds negative pediatric ROS (+)  Hematology negative hematology ROS (+)   Anesthesia Other Findings   Reproductive/Obstetrics negative OB ROS                             Anesthesia Physical Anesthesia Plan  ASA: III  Anesthesia Plan: Spinal   Post-op Pain Management:  Regional for Post-op pain   Induction: Intravenous  PONV Risk Score and Plan: 2 and Ondansetron, Dexamethasone and Treatment may vary due to age or medical condition  Airway Management Planned: Simple Face Mask  Additional Equipment:   Intra-op Plan:   Post-operative Plan:   Informed Consent: I have reviewed the patients History and Physical, chart, labs and discussed the procedure including the risks, benefits and alternatives for the proposed anesthesia with the patient or authorized representative who has indicated his/her understanding and acceptance.     Dental advisory given  Plan Discussed with: CRNA and Surgeon  Anesthesia Plan Comments:         Anesthesia Quick Evaluation

## 2020-03-11 NOTE — Anesthesia Postprocedure Evaluation (Signed)
Anesthesia Post Note  Patient: Terri Shah  Procedure(s) Performed: TOTAL KNEE ARTHROPLASTY (Left Knee)     Patient location during evaluation: PACU Anesthesia Type: Spinal Level of consciousness: oriented and awake and alert Pain management: pain level controlled Vital Signs Assessment: post-procedure vital signs reviewed and stable Respiratory status: spontaneous breathing, respiratory function stable and patient connected to nasal cannula oxygen Cardiovascular status: blood pressure returned to baseline and stable Postop Assessment: no headache, no backache and no apparent nausea or vomiting Anesthetic complications: no    Last Vitals:  Vitals:   03/11/20 1454 03/11/20 1548  BP: (!) 156/66 (!) 157/71  Pulse: (!) 56 (!) 57  Resp: 16 16  Temp:  36.6 C  SpO2: 99% (!) 82%    Last Pain:  Vitals:   03/11/20 1555  TempSrc:   PainSc: 5                  [name] S

## 2020-03-11 NOTE — Anesthesia Procedure Notes (Signed)
Anesthesia Regional Block: Adductor canal block   Pre-Anesthetic Checklist: ,, timeout performed, Correct Patient, Correct Site, Correct Laterality, Correct Procedure, Correct Position, site marked, Risks and benefits discussed,  Surgical consent,  Pre-op evaluation,  At surgeon's request and post-op pain management  Laterality: Left  Prep: chloraprep       Needles:  Injection technique: Single-shot  Needle Type: Stimiplex     Needle Length: 9cm      Additional Needles:   Procedures:,,,, ultrasound used (permanent image in chart),,,,  Narrative:  Start time: 03/11/2020 9:11 AM End time: 03/11/2020 9:19 AM Injection made incrementally with aspirations every 5 mL.  Performed by: Personally  Anesthesiologist: Myrtie Soman, MD  Additional Notes: Patient tolerated the procedure well without complications

## 2020-03-11 NOTE — Op Note (Signed)
NAME:  [place] RECORD NO.:  [id number]                             FACILITY:  [facility]      PHYSICIAN:  Pietro Cassis. Alvan Dame, M.D.  DATE OF BIRTH:  1934-01-11      DATE OF PROCEDURE:  03/11/2020                                     OPERATIVE REPORT         PREOPERATIVE DIAGNOSIS:  Left knee osteoarthritis.      POSTOPERATIVE DIAGNOSIS:  Left knee osteoarthritis.      FINDINGS:  The patient was noted to have complete loss of cartilage and   bone-on-bone arthritis with associated osteophytes in the medial and patellofemoral compartments of   the knee with a significant synovitis and associated effusion.  The patient had failed months of conservative treatment including medications, injection therapy, activity modification.     PROCEDURE:  Left total knee replacement.      COMPONENTS USED:  DePuy Attune rotating platform posterior stabilized knee   system, a size 4 femur, 3 tibia, size 6 mm PS AOX insert, and 35 anatomic patellar   button.      SURGEON:  Pietro Cassis. Alvan Dame, M.D.      ASSISTANT:  Griffith Citron, PA-C.      ANESTHESIA:  Regional and Spinal.      SPECIMENS:  None.      COMPLICATION:  None.      DRAINS:  None.  EBL: <100cc      TOURNIQUET TIME:   Total Tourniquet Time Documented: Thigh (Left) - 28 minutes Total: Thigh (Left) - 28 minutes  .      The patient was stable to the recovery room.      INDICATION FOR PROCEDURE:  Terri Shah is a 86 y.o. female patient of   mine.  The patient had been seen, evaluated, and treated for months conservatively in the   office with medication, activity modification, and injections.  The patient had   radiographic changes of bone-on-bone arthritis with endplate sclerosis and osteophytes noted.  Based on the radiographic changes and failed conservative measures, the patient   decided to proceed with definitive treatment, total knee replacement.  Risks of infection, DVT, component failure, need  for revision surgery, neurovascular injury were reviewed in the office setting.  The postop course was reviewed stressing the efforts to maximize post-operative satisfaction and function.  Consent was obtained for benefit of pain   relief.      PROCEDURE IN DETAIL:  The patient was brought to the operative theater.   Once adequate anesthesia, preoperative antibiotics, 2 gm of Ancef,1 gm of Tranexamic Acid, and 10 mg of Decadron administered, the patient was positioned supine with a left thigh tourniquet placed.  The  left lower extremity was prepped and draped in sterile fashion.  A time-   out was performed identifying the patient, planned procedure, and the appropriate extremity.      The left lower extremity was placed in the [facility] leg holder.  The leg was   exsanguinated, tourniquet elevated to 250 mmHg.  A midline incision was   made  followed by median parapatellar arthrotomy.  Following initial   exposure, attention was first directed to the patella.  Precut   measurement was noted to be 21 mm.  I resected down to 13-14 mm and used a   35 anatomic patellar button to restore patellar height as well as cover the cut surface.      The lug holes were drilled and a metal shim was placed to protect the   patella from retractors and saw blade during the procedure.      At this point, attention was now directed to the femur.  The femoral   canal was opened with a drill, irrigated to try to prevent fat emboli.  An   intramedullary rod was passed at 3 degrees valgus, 9 mm of bone was   resected off the distal femur.  Following this resection, the tibia was   subluxated anteriorly.  Using the extramedullary guide, 2 mm of bone was resected off   the proximal medial tibia.  We confirmed the gap would be   stable medially and laterally with a size 5 spacer block as well as confirmed that the tibial cut was perpendicular in the coronal plane, checking with an alignment rod.      Once this was  done, I sized the femur to be a size 4 in the anterior-   posterior dimension, chose a standard component based on medial and   lateral dimension.  The size 4 rotation block was then pinned in   position anterior referenced using the C-clamp to set rotation.  The   anterior, posterior, and  chamfer cuts were made without difficulty nor   notching making certain that I was along the anterior cortex to help   with flexion gap stability.      The final box cut was made off the lateral aspect of distal femur.      At this point, the tibia was sized to be a size 3.  The size 3 tray was   then pinned in position through the medial third of the tubercle,   drilled, and keel punched.  Trial reduction was now carried with a 4 femur,  3 tibia, a size 6 mm PS insert, and the 35 anatomic patella botton.  The knee was brought to full extension with good flexion stability with the patella   tracking through the trochlea without application of pressure.  Given   all these findings the trial components removed.  Final components were   opened and cement was mixed.  The knee was irrigated with normal saline solution and pulse lavage.  The synovial lining was   then injected with 30 cc of 0.25% Marcaine with epinephrine, 1 cc of Toradol and 30 cc of NS for a total of 61 cc.     Final implants were then cemented onto cleaned and dried cut surfaces of bone with the knee brought to extension with a size 6 mm PS trial insert.      Once the cement had fully cured, excess cement was removed   throughout the knee.  I confirmed that I was satisfied with the range of   motion and stability, and the final size 6 mm PS AOX insert was chosen.  It was   placed into the knee.      The tourniquet had been let down at 28 minutes.  No significant   hemostasis was required.  The extensor mechanism was then reapproximated using #1 Vicryl and #  1 Stratafix sutures with the knee   in flexion.  The   remaining wound was closed  with 2-0 Vicryl and running 4-0 Monocryl.   The knee was cleaned, dried, dressed sterilely using Dermabond and   Aquacel dressing.  The patient was then   brought to recovery room in stable condition, tolerating the procedure   well.   Please note that Physician Assistant, Griffith Citron, PA-C was present for the entirety of the case, and was utilized for pre-operative positioning, peri-operative retractor management, general facilitation of the procedure and for primary wound closure at the end of the case.              Pietro Cassis Alvan Dame, M.D.    03/11/2020 11:19 AM

## 2020-03-11 NOTE — Evaluation (Signed)
Physical Therapy Evaluation Patient Details Name: Terri Shah MRN: [id number] DOB: 18-Aug-1934 Today's Date: 03/11/2020   History of Present Illness  Patient is 86 y.o. female s/p Lt TKA on 03/11/20 with PMH significant for fibromyalgia, osteoporosis, HTN, HLD, glaucoma, OA.    Clinical Impression  BAYYINAH HARER is a 86 y.o. female POD 0 s/p Lt TKA. Patient reports independence with mobility at baseline. Patient is now limited by functional impairments (see PT problem list below) and requires min-mod assist for transfers with RW. Patient limited this session by pain and ambulation deferred. Patient instructed in exercise to facilitate ROM and circulation. Patient will benefit from continued skilled PT interventions to address impairments and progress towards PLOF. Acute PT will follow to progress mobility and stair training in preparation for safe discharge home.     Follow Up Recommendations Follow surgeon's recommendation for DC plan and follow-up therapies;Home health PT(pt reports she is getting HHPT)    Equipment Recommendations  Rolling walker with 5" wheels(youth)    Recommendations for Other Services       Precautions / Restrictions Precautions Precautions: Fall Restrictions Weight Bearing Restrictions: No      Mobility  Bed Mobility Overal bed mobility: Needs Assistance Bed Mobility: Supine to Sit     Supine to sit: HOB elevated;Min assist     General bed mobility comments: patient required cues for reaching for bed rail and assist to mobilize Lt LE to EOB, pt limited by pain.   Transfers Overall transfer level: Needs assistance Equipment used: Rolling walker (2 wheeled) Transfers: Sit to/from [facility] Sit to Stand: Min assist;Mod assist Stand pivot transfers: Min assist       General transfer comment: cues for safe hand placement and min-mod assist to rise to RW.  min assist for stand step transfer to recliner, min guarding at Lt knee to  facilitate knee extension in stance phase.  Ambulation/Gait      General Gait Details: deffered due to pain  Stairs       Wheelchair Mobility    Modified Rankin (Stroke Patients Only)       Balance Overall balance assessment: Needs assistance Sitting-balance support: Feet supported Sitting balance-Leahy Scale: Good     Standing balance support: During functional activity;Bilateral upper extremity supported Standing balance-Leahy Scale: Poor              Pertinent Vitals/Pain Pain Assessment: 0-10 Pain Score: 7  Pain Location: Lt knee Pain Descriptors / Indicators: Aching Pain Intervention(s): Limited activity within patient's tolerance;Monitored during session;Repositioned;Ice applied    Home Living Family/patient expects to be discharged to:: Private residence Living Arrangements: Alone Available Help at Discharge: Family;Available 24 hours/day(pt reports he daughters, granddaughter, and sons can assist ) Type of Home: House Home Access: Stairs to enter Entrance Stairs-Rails: None Entrance Stairs-Number of Steps: 1 Home Layout: Two level;Full bath on main level Home Equipment: Grab bars - tub/shower;Grab bars - toilet;Walker - 2 wheels;Cane - single point;Bedside commode Additional Comments: pt's family can help through the weekend, she may hire an aid or get friends to help her next week if needed.    Prior Function Level of Independence: Independent         Comments: pt denies use of AD for mobility prior to surgery     Hand Dominance   Dominant Hand: Right    Extremity/Trunk Assessment   Upper Extremity Assessment Upper Extremity Assessment: Overall WFL for tasks assessed    Lower Extremity Assessment Lower Extremity Assessment:  Generalized weakness;LLE deficits/detail LLE Deficits / Details: pt limited by pain, mild buckling in standing but able to reduce pressure on LE with RW support LLE: Unable to fully assess due to pain LLE  Sensation: WNL    Cervical / Trunk Assessment Cervical / Trunk Assessment: Normal  Communication   Communication: No difficulties  Cognition Arousal/Alertness: Awake/alert Behavior During Therapy: WFL for tasks assessed/performed Overall Cognitive Status: Within Functional Limits for tasks assessed           General Comments      Exercises Total Joint Exercises Ankle Circles/Pumps: AROM;Both;10 reps;Seated Quad Sets: Left;5 reps;Seated;AROM   Assessment/Plan    PT Assessment Patient needs continued PT services  PT Problem List Decreased strength;Decreased range of motion;Decreased activity tolerance;Decreased balance;Decreased mobility;Decreased knowledge of use of DME       PT Treatment Interventions DME instruction;Gait training;Stair training;Functional mobility training;Therapeutic activities;Therapeutic exercise;Balance training;Patient/family education    PT Goals (Current goals can be found in the Care Plan section)  Acute Rehab PT Goals Patient Stated Goal: to stop hurting PT Goal Formulation: With patient Time For Goal Achievement: 03/18/20 Potential to Achieve Goals: Good    Frequency 7X/week    AM-PAC PT "6 Clicks" Mobility  Outcome Measure Help needed turning from your back to your side while in a flat bed without using bedrails?: A Little Help needed moving from lying on your back to sitting on the side of a flat bed without using bedrails?: A Little Help needed moving to and from a bed to a chair (including a wheelchair)?: A Little Help needed standing up from a chair using your arms (e.g., wheelchair or bedside chair)?: A Little Help needed to walk in hospital room?: A Little Help needed climbing 3-5 steps with a railing? : A Little 6 Click Score: 18    End of Session Equipment Utilized During Treatment: Gait belt Activity Tolerance: Patient tolerated treatment well Patient left: in chair;with call bell/phone within reach;with chair alarm  set Nurse Communication: Mobility status PT Visit Diagnosis: Muscle weakness (generalized) (M62.81);Difficulty in walking, not elsewhere classified (R26.2)    Time: [id number] PT Time Calculation (min) (ACUTE ONLY): 21 min   Charges:   PT Evaluation $PT Eval Low Complexity: 1 Low         Verner Mould, DPT Physical Therapist with [facility] [phone]  03/11/2020 5:47 PM

## 2020-03-11 NOTE — Transfer of Care (Signed)
Immediate Anesthesia Transfer of Care Note  Patient: Terri Shah  Procedure(s) Performed: TOTAL KNEE ARTHROPLASTY (Left Knee)  Patient Location: PACU  Anesthesia Type:Spinal  Level of Consciousness: awake, alert , oriented and patient cooperative  Airway & Oxygen Therapy: Patient Spontanous Breathing and Patient connected to face mask oxygen  Post-op Assessment: Report given to RN and Post -op Vital signs reviewed and stable  Post vital signs: Reviewed and stable  Last Vitals:  Vitals Value Taken Time  BP 141/66 03/11/20 1152  Temp    Pulse 65 03/11/20 1155  Resp 15 03/11/20 1155  SpO2 100 % 03/11/20 1155  Vitals shown include unvalidated device data.  Last Pain:  Vitals:   03/11/20 0741  TempSrc: Oral  PainSc: 5          Complications: No apparent anesthesia complications

## 2020-03-11 NOTE — Discharge Instructions (Addendum)
INSTRUCTIONS AFTER JOINT REPLACEMENT   o Remove items at home which could result in a fall. This includes throw rugs or furniture in walking pathways o ICE to the affected joint every three hours while awake for 30 minutes at a time, for at least the first 3-5 days, and then as needed for pain and swelling.  Continue to use ice for pain and swelling. You may notice swelling that will progress down to the foot and ankle.  This is normal after surgery.  Elevate your leg when you are not up walking on it.   o Continue to use the breathing machine you got in the hospital (incentive spirometer) which will help keep your temperature down.  It is common for your temperature to cycle up and down following surgery, especially at night when you are not up moving around and exerting yourself.  The breathing machine keeps your lungs expanded and your temperature down.   DIET:  As you were doing prior to hospitalization, we recommend a well-balanced diet.  DRESSING / WOUND CARE / SHOWERING  Keep the surgical dressing until follow up.  The dressing is water proof, so you can shower without any extra covering.  IF THE DRESSING FALLS OFF or the wound gets wet inside, change the dressing with sterile gauze.  Please use good hand washing techniques before changing the dressing.  Do not use any lotions or creams on the incision until instructed by your surgeon.    ACTIVITY  o Increase activity slowly as tolerated, but follow the weight bearing instructions below.   o No driving for 6 weeks or until further direction given by your physician.  You cannot drive while taking narcotics.  o No lifting or carrying greater than 10 lbs. until further directed by your surgeon. o Avoid periods of inactivity such as sitting longer than an hour when not asleep. This helps prevent blood clots.  o You may return to work once you are authorized by your doctor.     WEIGHT BEARING   Weight bearing as tolerated with assist  device (walker, cane, etc) as directed, use it as long as suggested by your surgeon or therapist, typically at least 4-6 weeks.   EXERCISES  Results after joint replacement surgery are often greatly improved when you follow the exercise, range of motion and muscle strengthening exercises prescribed by your doctor. Safety measures are also important to protect the joint from further injury. Any time any of these exercises cause you to have increased pain or swelling, decrease what you are doing until you are comfortable again and then slowly increase them. If you have problems or questions, call your caregiver or physical therapist for advice.   Rehabilitation is important following a joint replacement. After just a few days of immobilization, the muscles of the leg can become weakened and shrink (atrophy).  These exercises are designed to build up the tone and strength of the thigh and leg muscles and to improve motion. Often times heat used for twenty to thirty minutes before working out will loosen up your tissues and help with improving the range of motion but do not use heat for the first two weeks following surgery (sometimes heat can increase post-operative swelling).   These exercises can be done on a training (exercise) mat, on the floor, on a table or on a bed. Use whatever works the best and is most comfortable for you.    Use music or television while you are exercising so that   the exercises are a pleasant break in your day. This will make your life better with the exercises acting as a break in your routine that you can look forward to.   Perform all exercises about fifteen times, three times per day or as directed.  You should exercise both the operative leg and the other leg as well.  Exercises include:   . Quad Sets - Tighten up the muscle on the front of the thigh (Quad) and hold for 5-10 seconds.   . Straight Leg Raises - With your knee straight (if you were given a brace, keep it on),  lift the leg to 60 degrees, hold for 3 seconds, and slowly lower the leg.  Perform this exercise against resistance later as your leg gets stronger.  . Leg Slides: Lying on your back, slowly slide your foot toward your buttocks, bending your knee up off the floor (only go as far as is comfortable). Then slowly slide your foot back down until your leg is flat on the floor again.  . Angel Wings: Lying on your back spread your legs to the side as far apart as you can without causing discomfort.  . Hamstring Strength:  Lying on your back, push your heel against the floor with your leg straight by tightening up the muscles of your buttocks.  Repeat, but this time bend your knee to a comfortable angle, and push your heel against the floor.  You may put a pillow under the heel to make it more comfortable if necessary.   A rehabilitation program following joint replacement surgery can speed recovery and prevent re-injury in the future due to weakened muscles. Contact your doctor or a physical therapist for more information on knee rehabilitation.    CONSTIPATION  Constipation is defined medically as fewer than three stools per week and severe constipation as less than one stool per week.  Even if you have a regular bowel pattern at home, your normal regimen is likely to be disrupted due to multiple reasons following surgery.  Combination of anesthesia, postoperative narcotics, change in appetite and fluid intake all can affect your bowels.   YOU MUST use at least one of the following options; they are listed in order of increasing strength to get the job done.  They are all available over the counter, and you may need to use some, POSSIBLY even all of these options:    Drink plenty of fluids (prune juice may be helpful) and high fiber foods Colace 100 mg by mouth twice a day  Senokot for constipation as directed and as needed Dulcolax (bisacodyl), take with full glass of water  Miralax (polyethylene glycol)  once or twice a day as needed.  If you have tried all these things and are unable to have a bowel movement in the first 3-4 days after surgery call either your surgeon or your primary doctor.    If you experience loose stools or diarrhea, hold the medications until you stool forms back up.  If your symptoms do not get better within 1 week or if they get worse, check with your doctor.  If you experience "the worst abdominal pain ever" or develop nausea or vomiting, please contact the office immediately for further recommendations for treatment.   ITCHING:  If you experience itching with your medications, try taking only a single pain pill, or even half a pain pill at a time.  You can also use Benadryl over the counter for itching or also to   help with sleep.   TED HOSE STOCKINGS:  Use stockings on both legs until for at least 2 weeks or as directed by physician office. They may be removed at night for sleeping.  MEDICATIONS:  See your medication summary on the "After Visit Summary" that nursing will review with you.  You may have some home medications which will be placed on hold until you complete the course of blood thinner medication.  It is important for you to complete the blood thinner medication as prescribed.  PRECAUTIONS:  If you experience chest pain or shortness of breath - call 911 immediately for transfer to the hospital emergency department.   If you develop a fever greater that 101 F, purulent drainage from wound, increased redness or drainage from wound, foul odor from the wound/dressing, or calf pain - CONTACT YOUR SURGEON.                                                   FOLLOW-UP APPOINTMENTS:  If you do not already have a post-op appointment, please call the office for an appointment to be seen by your surgeon.  Guidelines for how soon to be seen are listed in your "After Visit Summary", but are typically between 1-4 weeks after surgery.  OTHER INSTRUCTIONS:   Knee  Replacement:  Do not place pillow under knee, focus on keeping the knee straight while resting.    DENTAL ANTIBIOTICS:  In most cases prophylactic antibiotics for Dental procdeures after total joint surgery are not necessary.  Exceptions are as follows:  1. History of prior total joint infection  2. Severely immunocompromised (Organ Transplant, cancer chemotherapy, Rheumatoid biologic meds such as Humera)  3. Poorly controlled diabetes (A1C &gt; 8.0, blood glucose over 200)  If you have one of these conditions, contact your surgeon for an antibiotic prescription, prior to your dental procedure.   MAKE SURE YOU:  . Understand these instructions.  . Get help right away if you are not doing well or get worse.    Thank you for letting us be a part of your medical care team.  It is a privilege we respect greatly.  We hope these instructions will help you stay on track for a fast and full recovery!    Information on my medicine - XARELTO (Rivaroxaban)  This medication education was reviewed with me or my healthcare representative as part of my discharge preparation.  The pharmacist that spoke with me during my hospital stay was:    Why was Xarelto prescribed for you? Xarelto was prescribed for you to reduce the risk of blood clots forming after orthopedic surgery. The medical term for these abnormal blood clots is venous thromboembolism (VTE).  What do you need to know about xarelto ? Take your Xarelto ONCE DAILY at the same time every day. You may take it either with or without food.  If you have difficulty swallowing the tablet whole, you may crush it and mix in applesauce just prior to taking your dose.  Take Xarelto exactly as prescribed by your doctor and DO NOT stop taking Xarelto without talking to the doctor who prescribed the medication.  Stopping without other VTE prevention medication to take the place of Xarelto may increase your risk of developing a  clot.  After discharge, you should have regular check-up appointments with your healthcare provider   that is prescribing your Xarelto.    What do you do if you miss a dose? If you miss a dose, take it as soon as you remember on the same day then continue your regularly scheduled once daily regimen the next day. Do not take two doses of Xarelto on the same day.   Important Safety Information A possible side effect of Xarelto is bleeding. You should call your healthcare provider right away if you experience any of the following: ? Bleeding from an injury or your nose that does not stop. ? Unusual colored urine (red or dark brown) or unusual colored stools (red or black). ? Unusual bruising for unknown reasons. ? A serious fall or if you hit your head (even if there is no bleeding).  Some medicines may interact with Xarelto and might increase your risk of bleeding while on Xarelto. To help avoid this, consult your healthcare provider or pharmacist prior to using any new prescription or non-prescription medications, including herbals, vitamins, non-steroidal anti-inflammatory drugs (NSAIDs) and supplements.  This website has more information on Xarelto: www.xarelto.com.    

## 2020-03-11 NOTE — Care Plan (Signed)
Ortho Bundle Case Management Note  Patient Details  Name: Terri Shah MRN: [id number] Date of Birth: 1934/01/12   L TKA on 03-11-20 DCP:  Home with assist from friends.  2 story home with 1 ste. MBR is on the 1st floor. DME:  No needs. PT:  [place].  Referral and orders sent for PT only x 5 visits. Will attend OP PT at [facility] following [place].                     DME Arranged:  N/A DME Agency:  NA  HH Arranged:  PT HH Agency:  [facility] ([facility])  Additional Comments: Please contact me with any questions of if this plan should need to change.  Marianne Sofia, RN,CCM [facility]  [phone] 03/11/2020, 10:26 AM

## 2020-03-12 ENCOUNTER — Encounter: Payer: Self-pay | Admitting: *Deleted

## 2020-03-12 LAB — BASIC METABOLIC PANEL
Anion gap: 8 (ref 5–15)
BUN: 8 mg/dL (ref 8–23)
CO2: 23 mmol/L (ref 22–32)
Calcium: 8.4 mg/dL — ABNORMAL LOW (ref 8.9–10.3)
Chloride: 96 mmol/L — ABNORMAL LOW (ref 98–111)
Creatinine, Ser: 0.38 mg/dL — ABNORMAL LOW (ref 0.44–1.00)
GFR calc Af Amer: >60 mL/min (ref >60)
GFR calc non Af Amer: >60 mL/min (ref >60)
Glucose, Bld: 128 mg/dL — ABNORMAL HIGH (ref 70–99)
Potassium: 3.7 mmol/L (ref 3.5–5.1)
Sodium: 127 mmol/L — ABNORMAL LOW (ref 135–145)

## 2020-03-12 LAB — CBC
HCT: 30 % — ABNORMAL LOW (ref 36.0–46.0)
Hemoglobin: 9.6 g/dL — ABNORMAL LOW (ref 12.0–15.0)
MCH: 30.5 pg (ref 26.0–34.0)
MCHC: 32 g/dL (ref 30.0–36.0)
MCV: 95.2 fL (ref 80.0–100.0)
Platelets: 268 10*3/uL (ref 150–400)
RBC: 3.15 MIL/uL — ABNORMAL LOW (ref 3.87–5.11)
RDW: 12.8 % (ref 11.5–15.5)
WBC: 13.8 10*3/uL — ABNORMAL HIGH (ref 4.0–10.5)
nRBC: 0 % (ref 0.0–0.2)

## 2020-03-12 MED ORDER — OXYCODONE HCL 5 MG PO TABS
10.0000 mg | ORAL_TABLET | ORAL | Status: DC | PRN
  Administered 2020-03-13 (×2): 10 mg via ORAL
  Filled 2020-03-12 (×2): qty 2

## 2020-03-12 MED ORDER — OXYCODONE HCL 5 MG PO TABS
5.0000 mg | ORAL_TABLET | ORAL | Status: DC | PRN
  Administered 2020-03-12 – 2020-03-13 (×3): 5 mg via ORAL
  Filled 2020-03-12 (×4): qty 1

## 2020-03-12 NOTE — Progress Notes (Signed)
Physical Therapy Treatment Patient Details Name: Terri Shah MRN: [id number] DOB: [birth year] Today's Date: 03/12/2020    History of Present Illness Patient is 86 y.o. female s/p Lt TKA on 03/11/20 with PMH significant for fibromyalgia, osteoporosis, HTN, HLD, glaucoma, OA.    PT Comments    Pt ambulated in hallway again and declined practicing one step, states she only has threshold to enter home.  Pt assisted back to bed and reports d/c home tomorrow.    Follow Up Recommendations  Follow surgeon's recommendation for DC plan and follow-up therapies;Home health PT     Equipment Recommendations  None recommended by PT    Recommendations for Other Services       Precautions / Restrictions Precautions Precautions: Fall;Knee Restrictions Weight Bearing Restrictions: No    Mobility  Bed Mobility Overal bed mobility: Needs Assistance Bed Mobility: Supine to Sit;Sit to Supine     Supine to sit: Min assist;HOB elevated Sit to supine: Min assist   General bed mobility comments: verbal cues for self assist however pt unable due to pain, assist for L LE  Transfers Overall transfer level: Needs assistance Equipment used: Rolling walker (2 wheeled) Transfers: Sit to/from Stand Sit to Stand: Min guard         General transfer comment: verbal cues for UE and LE positioning  Ambulation/Gait Ambulation/Gait assistance: Min guard Gait Distance (Feet): 80 Feet Assistive device: Rolling walker (2 wheeled) Gait Pattern/deviations: Step-to pattern;Decreased stance time - left;Antalgic     General Gait Details: verbal cues for sequence, RW positioning, step length, posture   Stairs             Wheelchair Mobility    Modified Rankin (Stroke Patients Only)       Balance                                            Cognition Arousal/Alertness: Awake/alert Behavior During Therapy: WFL for tasks assessed/performed Overall Cognitive Status:  Within Functional Limits for tasks assessed                                        Exercises    General Comments        Pertinent Vitals/Pain Pain Assessment: 0-10 Pain Score: 6  Pain Location: Lt knee Pain Descriptors / Indicators: Aching;Sore Pain Intervention(s): Monitored during session;Repositioned    Home Living                      Prior Function            PT Goals (current goals can now be found in the care plan section) Progress towards PT goals: Progressing toward goals    Frequency    7X/week      PT Plan Current plan remains appropriate    Co-evaluation              AM-PAC PT "6 Clicks" Mobility   Outcome Measure  Help needed turning from your back to your side while in a flat bed without using bedrails?: A Little Help needed moving from lying on your back to sitting on the side of a flat bed without using bedrails?: A Little Help needed moving to and from a bed to a chair (including a wheelchair)?: A  Little Help needed standing up from a chair using your arms (e.g., wheelchair or bedside chair)?: A Little Help needed to walk in hospital room?: A Little Help needed climbing 3-5 steps with a railing? : A Little 6 Click Score: 18    End of Session Equipment Utilized During Treatment: Gait belt Activity Tolerance: Patient tolerated treatment well Patient left: with call bell/phone within reach;in bed;with bed alarm set Nurse Communication: Mobility status PT Visit Diagnosis: Muscle weakness (generalized) (M62.81);Difficulty in walking, not elsewhere classified (R26.2)     Time: [id number] PT Time Calculation (min) (ACUTE ONLY): 14 min  Charges:  $Gait Training: 8-22 mins                    Arlyce Dice, DPT Acute Rehabilitation Services Office: [phone]  Trena Platt 03/12/2020, 2:35 PM

## 2020-03-12 NOTE — Plan of Care (Signed)
  Problem: Education: Goal: Knowledge of the prescribed therapeutic regimen will improve Outcome: Progressing Goal: Individualized Educational Video(s) Outcome: Progressing   Problem: Activity: Goal: Ability to avoid complications of mobility impairment will improve Outcome: Progressing Goal: Range of joint motion will improve Outcome: Progressing   Problem: Pain Management: Goal: Pain level will decrease with appropriate interventions Outcome: Progressing   Problem: Education: Goal: Knowledge of General Education information will improve Description: Including pain rating scale, medication(s)/side effects and non-pharmacologic comfort measures Outcome: Progressing

## 2020-03-12 NOTE — Progress Notes (Signed)
     Subjective: 1 Day Post-Op Procedure(s) (LRB): TOTAL KNEE ARTHROPLASTY (Left)   Patient reports pain as moderate/severe, some control with medication. No reported events throughout the night. Discussed the procedure and expectations moving forward.  She mentioned going home, but after a discussion she will see how she progress today.  Plan for discharge possibly tomorrow due to underlying medical co-morbidities, pain control and need for inpatient therapy to meet goal of being discharged home safely with family/caregiver.     Anticipated LOS equal to or greater than 2 midnights due to - Age 84 and older with one or more of the following:  - Expected need for hospital services (PT, OT, Nursing) required for safe  discharge  - Anticipated need for postoperative skilled nursing care or inpatient rehab  - Active co-morbidities: Coronary Artery Disease, Stroke and Anemia    Objective:   VITALS:   Vitals:   03/12/20 0118 03/12/20 0534  BP: (!) 147/75 (!) 168/64  Pulse: 67 66  Resp: 16 16  Temp: 97.8 F (36.6 C) 98.2 F (36.8 C)  SpO2: 100% 100%    Dorsiflexion/Plantar flexion intact Incision: dressing C/D/I No cellulitis present Compartment soft  LABS Recent Labs    03/12/20 0339  HGB 9.6*  HCT 30.0*  WBC 13.8*  PLT 268    Recent Labs    03/12/20 0339  NA 127*  K 3.7  BUN 8  CREATININE 0.38*  GLUCOSE 128*     Assessment/Plan: 1 Day Post-Op Procedure(s) (LRB): TOTAL KNEE ARTHROPLASTY (Left) Keep analgesic meds and see how she does throughout the day. Foley cath d/c'ed Advance diet Up with therapy D/C IV fluids Discharge home with home health, eventually when ready     [name] PA-C  [facility]  [place] [address], [place], [place] [address] Phone: [phone] [email or web address] Facebook  [employer]

## 2020-03-12 NOTE — TOC Initial Note (Signed)
Transition of Care [facility]) - Initial/Assessment Note    Patient Details  Name: Terri Shah MRN: [id number] Date of Birth: Nov 07, 1934  [facility] CM/SW Contact:    Leeroy Cha, RN Phone Number: 03/12/2020, 10:08 AM  Clinical Narrative:                 dcd to home  Expected Discharge Plan: [place] Barriers to Discharge: No Barriers Identified   Patient Goals and CMS Choice Patient states their goals for this hospitalization and ongoing recovery are:: to go home CMS Medicare.gov Compare Post Acute Care list provided to:: Patient Choice offered to / list presented to : Patient  Expected Discharge Plan and Services Expected Discharge Plan: [place]   Discharge Planning Services: CM Consult Post Acute Care Choice: Durable Medical Equipment, Home Health Living arrangements for the past 2 months: Single Family Home                 DME Arranged: N/A DME Agency: NA       HH Arranged: PT HH Agency: [facility] ([place])        Prior Living Arrangements/Services Living arrangements for the past 2 months: Single Family Home Lives with:: Spouse Patient language and need for interpreter reviewed:: No Do you feel safe going back to the place where you live?: Yes      Need for Family Participation in Patient Care: Yes (Comment) Care giver support system in place?: Yes (comment)   Criminal Activity/Legal Involvement Pertinent to Current Situation/Hospitalization: No - Comment as needed  Activities of Daily Living Home Assistive Devices/Equipment: Eyeglasses, Dentures (specify type), Walker (specify type)(bttom partial, front wheel) ADL Screening (condition at time of admission) Patient's cognitive ability adequate to safely complete daily activities?: Yes Is the patient deaf or have difficulty hearing?: No Does the patient have difficulty seeing, even when wearing glasses/contacts?: Yes Does the patient  have difficulty concentrating, remembering, or making decisions?: No Patient able to express need for assistance with ADLs?: Yes Does the patient have difficulty dressing or bathing?: No Independently performs ADLs?: Yes (appropriate for developmental age) Does the patient have difficulty walking or climbing stairs?: Yes Weakness of Legs: Both Weakness of Arms/Hands: None  Permission Sought/Granted                  Emotional Assessment Appearance:: Appears stated age     Orientation: : Oriented to Self, Oriented to Place, Oriented to  Time, Oriented to Situation Alcohol / Substance Use: Not Applicable Psych Involvement: No (comment)  Admission diagnosis:  Status post total left knee replacement [Z96.652] Patient Active Problem List   Diagnosis Date Noted  . S/P left TKA 03/11/2020  . Preoperative clearance 02/22/2019  . Colon polyp   . Lower GI bleed 12/18/2015  . Diverticulosis of colon with hemorrhage   . Nausea with vomiting 12/02/2014  . Ileus, postoperative ([place]) 11/04/2014  . Hyponatremia 11/04/2014  . Abdominal fluid collection   . Chest pain 10/26/2014  . Acute calculous cholecystitis 10/26/2014  . Barrett esophagus   . History of GI diverticular bleed   . Essential hypertension   . Dyspnea 01/18/2012  . Aortic insufficiency 01/18/2012  . ANEMIA, IRON DEFICIENCY 06/04/2010  . HYPERCHOLESTEROLEMIA 02/08/2008  . GERD 02/08/2008  . ARTHRITIS 02/08/2008  . HIATAL HERNIA 11/02/2007  . DIVERTICULOSIS OF COLON 04/07/2004   PCP:  Shon Baton, MD Pharmacy:   [id number] - [place], [address]  [place] [address] Phone: [phone] Fax: [phone]     Social Determinants of Health (SDOH) Interventions    Readmission Risk Interventions No flowsheet data found.

## 2020-03-12 NOTE — Progress Notes (Signed)
Physical Therapy Treatment Patient Details Name: Terri Shah MRN: [id number] DOB: 31-Aug-1934 Today's Date: 03/12/2020    History of Present Illness Patient is 86 y.o. female s/p Lt TKA on 03/11/20 with PMH significant for fibromyalgia, osteoporosis, HTN, HLD, glaucoma, OA.    PT Comments    Pt ambulated in hallway and performed LE exercises.  Pt reports likely d/c home tomorrow.   Follow Up Recommendations  Follow surgeon's recommendation for DC plan and follow-up therapies;Home health PT     Equipment Recommendations  None recommended by PT(pt states she has youth size RW at home)    Recommendations for Other Services       Precautions / Restrictions Precautions Precautions: Fall;Knee Restrictions Weight Bearing Restrictions: No    Mobility  Bed Mobility Overal bed mobility: Needs Assistance Bed Mobility: Supine to Sit     Supine to sit: Min assist;HOB elevated     General bed mobility comments: verbal cues for self assist however pt unable due to pain, assist for L LE  Transfers Overall transfer level: Needs assistance Equipment used: Rolling walker (2 wheeled) Transfers: Sit to/from Stand Sit to Stand: Min assist         General transfer comment: verbal cues for UE and LE positioning, assist to rise and steady  Ambulation/Gait Ambulation/Gait assistance: Min guard   Assistive device: Rolling walker (2 wheeled) Gait Pattern/deviations: Step-to pattern;Decreased stance time - left;Antalgic     General Gait Details: verbal cues for sequence, RW positioning, step length, posture   Stairs             Wheelchair Mobility    Modified Rankin (Stroke Patients Only)       Balance                                            Cognition Arousal/Alertness: Awake/alert Behavior During Therapy: WFL for tasks assessed/performed Overall Cognitive Status: Within Functional Limits for tasks assessed                                        Exercises Total Joint Exercises Ankle Circles/Pumps: AROM;Both;10 reps Quad Sets: AROM;Both;10 reps Short Arc QuadSinclair Ship;Left;10 reps Hip ABduction/ADduction: AAROM;10 reps;Left Straight Leg Raises: AAROM;Left;10 reps Knee Flexion: AAROM;Left;10 reps;Seated    General Comments        Pertinent Vitals/Pain Pain Assessment: 0-10 Pain Score: 6  Pain Location: Lt knee Pain Descriptors / Indicators: Aching;Sore Pain Intervention(s): Repositioned;Monitored during session;Limited activity within patient's tolerance    Home Living                      Prior Function            PT Goals (current goals can now be found in the care plan section) Progress towards PT goals: Progressing toward goals    Frequency    7X/week      PT Plan Current plan remains appropriate    Co-evaluation              AM-PAC PT "6 Clicks" Mobility   Outcome Measure  Help needed turning from your back to your side while in a flat bed without using bedrails?: A Little Help needed moving from lying on your back to sitting on the side of a  flat bed without using bedrails?: A Little Help needed moving to and from a bed to a chair (including a wheelchair)?: A Little Help needed standing up from a chair using your arms (e.g., wheelchair or bedside chair)?: A Little Help needed to walk in hospital room?: A Little Help needed climbing 3-5 steps with a railing? : A Little 6 Click Score: 18    End of Session Equipment Utilized During Treatment: Gait belt Activity Tolerance: Patient tolerated treatment well Patient left: in chair;with call bell/phone within reach;with chair alarm set Nurse Communication: Mobility status PT Visit Diagnosis: Muscle weakness (generalized) (M62.81);Difficulty in walking, not elsewhere classified (R26.2)     Time: [id number] PT Time Calculation (min) (ACUTE ONLY): 25 min  Charges:  $Gait Training: 8-22 mins $Therapeutic Exercise:  8-22 mins                     Arlyce Dice, DPT Acute Rehabilitation Services Office: [phone]   Trena Platt 03/12/2020, 2:24 PM

## 2020-03-13 LAB — BASIC METABOLIC PANEL
Anion gap: 7 (ref 5–15)
BUN: 10 mg/dL (ref 8–23)
CO2: 27 mmol/L (ref 22–32)
Calcium: 8.8 mg/dL — ABNORMAL LOW (ref 8.9–10.3)
Chloride: 97 mmol/L — ABNORMAL LOW (ref 98–111)
Creatinine, Ser: 0.54 mg/dL (ref 0.44–1.00)
GFR calc Af Amer: >60 mL/min (ref >60)
GFR calc non Af Amer: >60 mL/min (ref >60)
Glucose, Bld: 125 mg/dL — ABNORMAL HIGH (ref 70–99)
Potassium: 3.8 mmol/L (ref 3.5–5.1)
Sodium: 131 mmol/L — ABNORMAL LOW (ref 135–145)

## 2020-03-13 LAB — CBC
HCT: 26.9 % — ABNORMAL LOW (ref 36.0–46.0)
Hemoglobin: 8.5 g/dL — ABNORMAL LOW (ref 12.0–15.0)
MCH: 29.5 pg (ref 26.0–34.0)
MCHC: 31.6 g/dL (ref 30.0–36.0)
MCV: 93.4 fL (ref 80.0–100.0)
Platelets: 266 10*3/uL (ref 150–400)
RBC: 2.88 MIL/uL — ABNORMAL LOW (ref 3.87–5.11)
RDW: 13 % (ref 11.5–15.5)
WBC: 14.3 10*3/uL — ABNORMAL HIGH (ref 4.0–10.5)
nRBC: 0 % (ref 0.0–0.2)

## 2020-03-13 LAB — GLUCOSE, CAPILLARY: Glucose-Capillary: 134 mg/dL — ABNORMAL HIGH (ref 70–99)

## 2020-03-13 NOTE — Progress Notes (Signed)
Physical Therapy Treatment Patient Details Name: Terri Shah MRN: [id number] DOB: May 28, 1934 Today's Date: 03/13/2020    History of Present Illness Patient is 86 y.o. female s/p Lt TKA on 03/11/20 with PMH significant for fibromyalgia, osteoporosis, HTN, HLD, glaucoma, OA.    PT Comments    Pt ambulated short distance in hallway and limited by nausea.  Vitals obtained and in docflowsheets (appeared stable).  Pt likely to d/c home tomorrow.   Follow Up Recommendations  Follow surgeon's recommendation for DC plan and follow-up therapies;Home health PT     Equipment Recommendations  None recommended by PT    Recommendations for Other Services       Precautions / Restrictions Precautions Precautions: Fall;Knee Restrictions Weight Bearing Restrictions: No    Mobility  Bed Mobility Overal bed mobility: Needs Assistance Bed Mobility: Supine to Sit     Supine to sit: Min assist;HOB elevated     General bed mobility comments: verbal cues for self assist however pt unable due to pain, assist for L LE  Transfers Overall transfer level: Needs assistance Equipment used: Rolling walker (2 wheeled) Transfers: Sit to/from Stand Sit to Stand: Min assist         General transfer comment: verbal cues for UE and LE positioning  Ambulation/Gait Ambulation/Gait assistance: Min guard;Min assist Gait Distance (Feet): 18 Feet Assistive device: Rolling walker (2 wheeled) Gait Pattern/deviations: Step-to pattern;Decreased stance time - left;Antalgic     General Gait Details: verbal cues for sequence, RW positioning, step length, posture; distance limited due to nausea   Stairs             Wheelchair Mobility    Modified Rankin (Stroke Patients Only)       Balance Overall balance assessment: Needs assistance                                          Cognition Arousal/Alertness: Awake/alert Behavior During Therapy: WFL for tasks  assessed/performed Overall Cognitive Status: Within Functional Limits for tasks assessed                                        Exercises      General Comments        Pertinent Vitals/Pain Pain Assessment: 0-10 Pain Score: 7  Pain Location: Lt knee Pain Descriptors / Indicators: Aching;Sore;Tightness Pain Intervention(s): Repositioned;Monitored during session    Home Living                      Prior Function            PT Goals (current goals can now be found in the care plan section) Progress towards PT goals: Progressing toward goals    Frequency    7X/week      PT Plan Current plan remains appropriate    Co-evaluation              AM-PAC PT "6 Clicks" Mobility   Outcome Measure  Help needed turning from your back to your side while in a flat bed without using bedrails?: A Little Help needed moving from lying on your back to sitting on the side of a flat bed without using bedrails?: A Little Help needed moving to and from a bed to a chair (including a wheelchair)?:  A Little Help needed standing up from a chair using your arms (e.g., wheelchair or bedside chair)?: A Little Help needed to walk in hospital room?: A Little Help needed climbing 3-5 steps with a railing? : A Little 6 Click Score: 18    End of Session Equipment Utilized During Treatment: Gait belt Activity Tolerance: Patient tolerated treatment well Patient left: with call bell/phone within reach;in chair;with chair alarm set   PT Visit Diagnosis: Muscle weakness (generalized) (M62.81);Difficulty in walking, not elsewhere classified (R26.2)     Time: [id number] PT Time Calculation (min) (ACUTE ONLY): 15 min  Charges:  $Gait Training: 8-22 mins                    Arlyce Dice, DPT Acute Rehabilitation Services Office: [phone]  York Ram E 03/13/2020, 1:23 PM

## 2020-03-13 NOTE — Progress Notes (Signed)
At 0759 the duress button in the patient's room was pressed by the patient's NT. When I arrived to the room there were already 3 staff members present that had been close by. The patient was unconscious, being held up by the NT at the foot of the bed. Per the NT, she had assisted the patient to the bathroom to urinate, on the way back to the bed the patient said she "didn't feel right", and then lost consciousness. With the assistance from the other staff present we sat the patient down on the bsc and she opened her eyes. Initial set of VS once she was sat down was BP 62/41, HR 51, O2 sat 97% on room air. The patient's PA [facility] was on the floor and walked into the room after she was sat down and she regained consciousness. At 0802 BP was 109/62. We gave her cold water to drink and a cold cloth on her head, she was able to tell us her name at this time. We got the patient back into the bed and laid her flat. A new IV was started and a 500cc bolus initiated. At 0806 her BP was 137/57, HR 63. The patient's color had returned to her face. A couple minutes later the PA came back in the room to talk to the patient about her pain medication and the need to stay another day.

## 2020-03-13 NOTE — Progress Notes (Signed)
     Subjective: 2 Days Post-Op Procedure(s) (LRB): TOTAL KNEE ARTHROPLASTY (Left)   Patient reports pain as moderate.  She had an incident this AM when went out on the nurse/tech.  She is doing well at the time of being seen.  Possible cause is analgesic medications. Discussed using the least amount to control her pain. She seems to be doing ok now, but discussed keeping her another day to see how she does with analgesic meds and PT.   Plan for discharge possibly tomorrow due to underlying medical co-morbidities, pain control and need for inpatient therapy to meet goal of being discharged home safely with family/caregiver.     Objective:   VITALS:   Vitals:   03/12/20 2200 03/13/20 0605  BP: (!) 160/80 (!) 174/71  Pulse: 70 78  Resp: 18 18  Temp: 97.7 F (36.5 C) 98.8 F (37.1 C)  SpO2: 97% 96%    Dorsiflexion/Plantar flexion intact Incision: dressing C/D/I No cellulitis present Compartment soft  LABS Recent Labs    03/12/20 0339 03/13/20 0313  HGB 9.6* 8.5*  HCT 30.0* 26.9*  WBC 13.8* 14.3*  PLT 268 266    Recent Labs    03/12/20 0339 03/13/20 0313  NA 127* 131*  K 3.7 3.8  BUN 8 10  CREATININE 0.38* 0.54  GLUCOSE 128* 125*     Assessment/Plan: 2 Days Post-Op Procedure(s) (LRB): TOTAL KNEE ARTHROPLASTY (Left) Keeps analgesic meds at Oxy 5 and see how she does. IV saline bolus Up with therapy Discharge home with home health eventually, when ready        [name] PA-C  [facility]  [place] [address], [address], [place], [place] [address] Phone: [phone] [email or web address] Facebook  [employer]

## 2020-03-13 NOTE — Progress Notes (Signed)
Physical Therapy Treatment Patient Details Name: Terri Shah MRN: [id number] DOB: 1934/01/12 Today's Date: 03/13/2020    History of Present Illness Patient is 86 y.o. female s/p Lt TKA on 03/11/20 with PMH significant for fibromyalgia, osteoporosis, HTN, HLD, glaucoma, OA.    PT Comments    Pt assisted with ambulating in hallway again and able to improve distance a little. Pt assisted back to bed.  Pt with 8/10 pain with assisting movement of L LE (for moving forward in chair and assist back to bed) and pt wished to defer exercises today.  RN to bring pain meds after session.  Pt's L LE elevated (increased edema in leg observed today).   Follow Up Recommendations  Follow surgeon's recommendation for DC plan and follow-up therapies;Home health PT     Equipment Recommendations  None recommended by PT    Recommendations for Other Services       Precautions / Restrictions Precautions Precautions: Fall;Knee Restrictions Weight Bearing Restrictions: No    Mobility  Bed Mobility Overal bed mobility: Needs Assistance Bed Mobility: Sit to Supine     Supine to sit: Min assist;HOB elevated Sit to supine: Min assist   General bed mobility comments: assist for L LE  Transfers Overall transfer level: Needs assistance Equipment used: Rolling walker (2 wheeled) Transfers: Sit to/from Stand Sit to Stand: Min guard         General transfer comment: verbal cues for UE and LE positioning, increased time and effort  Ambulation/Gait Ambulation/Gait assistance: Min guard Gait Distance (Feet): 50 Feet Assistive device: Rolling walker (2 wheeled) Gait Pattern/deviations: Step-to pattern;Decreased stance time - left;Antalgic     General Gait Details: verbal cues for sequence, RW positioning, step length, posture   Stairs             Wheelchair Mobility    Modified Rankin (Stroke Patients Only)       Balance Overall balance assessment: Needs assistance                                           Cognition Arousal/Alertness: Awake/alert Behavior During Therapy: WFL for tasks assessed/performed Overall Cognitive Status: Within Functional Limits for tasks assessed                                        Exercises      General Comments        Pertinent Vitals/Pain Pain Assessment: 0-10 Pain Score: 4  Pain Location: Lt knee with weight bearing Pain Descriptors / Indicators: Aching;Sore;Tightness Pain Intervention(s): Repositioned;Monitored during session;Patient requesting pain meds-RN notified    Home Living                      Prior Function            PT Goals (current goals can now be found in the care plan section) Progress towards PT goals: Progressing toward goals    Frequency    7X/week      PT Plan Current plan remains appropriate    Co-evaluation              AM-PAC PT "6 Clicks" Mobility   Outcome Measure  Help needed turning from your back to your side while in a flat bed without using bedrails?:  A Little Help needed moving from lying on your back to sitting on the side of a flat bed without using bedrails?: A Little Help needed moving to and from a bed to a chair (including a wheelchair)?: A Little Help needed standing up from a chair using your arms (e.g., wheelchair or bedside chair)?: A Little Help needed to walk in hospital room?: A Little Help needed climbing 3-5 steps with a railing? : A Little 6 Click Score: 18    End of Session Equipment Utilized During Treatment: Gait belt Activity Tolerance: Patient limited by pain Patient left: in bed;with call bell/phone within reach Nurse Communication: Mobility status;Patient requests pain meds PT Visit Diagnosis: Muscle weakness (generalized) (M62.81);Difficulty in walking, not elsewhere classified (R26.2)     Time: [id number] PT Time Calculation (min) (ACUTE ONLY): 19 min  Charges:  $Gait Training: 8-22  mins                     Arlyce Dice, DPT Acute Rehabilitation Services Office: [phone]  Trena Platt 03/13/2020, 4:25 PM

## 2020-03-14 DIAGNOSIS — M659 Synovitis and tenosynovitis, unspecified: Secondary | ICD-10-CM | POA: Diagnosis not present

## 2020-03-14 DIAGNOSIS — K219 Gastro-esophageal reflux disease without esophagitis: Secondary | ICD-10-CM | POA: Diagnosis not present

## 2020-03-14 DIAGNOSIS — Z96652 Presence of left artificial knee joint: Secondary | ICD-10-CM

## 2020-03-14 DIAGNOSIS — M797 Fibromyalgia: Secondary | ICD-10-CM | POA: Diagnosis not present

## 2020-03-14 DIAGNOSIS — E785 Hyperlipidemia, unspecified: Secondary | ICD-10-CM | POA: Diagnosis not present

## 2020-03-14 DIAGNOSIS — Z79899 Other long term (current) drug therapy: Secondary | ICD-10-CM | POA: Diagnosis not present

## 2020-03-14 DIAGNOSIS — Z8673 Personal history of transient ischemic attack (TIA), and cerebral infarction without residual deficits: Secondary | ICD-10-CM | POA: Diagnosis not present

## 2020-03-14 DIAGNOSIS — Z8249 Family history of ischemic heart disease and other diseases of the circulatory system: Secondary | ICD-10-CM | POA: Diagnosis not present

## 2020-03-14 DIAGNOSIS — I10 Essential (primary) hypertension: Secondary | ICD-10-CM | POA: Diagnosis not present

## 2020-03-14 DIAGNOSIS — M25762 Osteophyte, left knee: Secondary | ICD-10-CM | POA: Diagnosis not present

## 2020-03-14 DIAGNOSIS — H409 Unspecified glaucoma: Secondary | ICD-10-CM | POA: Diagnosis not present

## 2020-03-14 DIAGNOSIS — M1712 Unilateral primary osteoarthritis, left knee: Secondary | ICD-10-CM | POA: Diagnosis not present

## 2020-03-14 DIAGNOSIS — Z885 Allergy status to narcotic agent status: Secondary | ICD-10-CM | POA: Diagnosis not present

## 2020-03-14 DIAGNOSIS — M810 Age-related osteoporosis without current pathological fracture: Secondary | ICD-10-CM | POA: Diagnosis not present

## 2020-03-14 DIAGNOSIS — M25462 Effusion, left knee: Secondary | ICD-10-CM | POA: Diagnosis not present

## 2020-03-14 DIAGNOSIS — Z823 Family history of stroke: Secondary | ICD-10-CM | POA: Diagnosis not present

## 2020-03-14 MED ORDER — HYDROCODONE-ACETAMINOPHEN 5-325 MG PO TABS: 1.0000 | ORAL_TABLET | ORAL | 0 refills | Status: AC | PRN

## 2020-03-14 MED ORDER — METHOCARBAMOL 500 MG PO TABS: 500.0000 mg | ORAL_TABLET | Freq: Four times a day (QID) | ORAL | 0 refills | Status: AC | PRN

## 2020-03-14 MED ORDER — DOCUSATE SODIUM 100 MG PO CAPS: 100.0000 mg | ORAL_CAPSULE | Freq: Two times a day (BID) | ORAL | 0 refills | Status: AC

## 2020-03-14 MED ORDER — RIVAROXABAN 10 MG PO TABS: 10.0000 mg | ORAL_TABLET | Freq: Every day | ORAL | 0 refills | Status: AC

## 2020-03-14 MED ORDER — HYDROCODONE-ACETAMINOPHEN 5-325 MG PO TABS: 1.0000 | ORAL_TABLET | ORAL | 0 refills | Status: DC | PRN

## 2020-03-14 MED ORDER — POLYETHYLENE GLYCOL 3350 17 G PO PACK: 17.0000 g | PACK | Freq: Two times a day (BID) | ORAL | 0 refills | Status: AC

## 2020-03-14 NOTE — Progress Notes (Signed)
Physical Therapy Treatment Patient Details Name: Terri Shah MRN: [id number] DOB: February 13, 1934 Today's Date: 03/14/2020    History of Present Illness Patient is 86 y.o. female s/p Lt TKA on 03/11/20 with PMH significant for fibromyalgia, osteoporosis, HTN, HLD, glaucoma, OA.    PT Comments    POD # 3 am session Pt "feeling better".  OOB in recliner.  AxO x 4 and motivated to leave today.  Assisted with amb in hallway.  General Gait Details: good, steady gait with walker (improving).  Then returned to room to perform some TE's following HEP handout.  Instructed on proper tech, freq as well as use of ICE.   Addressed all mobility questions, discussed appropriate activity, educated on use of ICE.  Pt ready for D/C to home.   Follow Up Recommendations  Follow surgeon's recommendation for DC plan and follow-up therapies;Home health PT     Equipment Recommendations  None recommended by PT    Recommendations for Other Services       Precautions / Restrictions Precautions Precautions: Fall;Knee Precaution Comments: instructed no pillow under knee Restrictions Weight Bearing Restrictions: No Other Position/Activity Restrictions: WBAT    Mobility  Bed Mobility               General bed mobility comments: OOB in recliner  Transfers Overall transfer level: Needs assistance Equipment used: Rolling walker (2 wheeled) Transfers: Sit to/from Stand Sit to Stand: Supervision Stand pivot transfers: Supervision       General transfer comment: good safety cognition and use of hands to steady self  Ambulation/Gait Ambulation/Gait assistance: Supervision Gait Distance (Feet): 65 Feet Assistive device: Rolling walker (2 wheeled) Gait Pattern/deviations: Step-to pattern;Decreased stance time - left;Antalgic Gait velocity: WFL   General Gait Details: good, steady gait with walker (improving)   Stairs             Wheelchair Mobility    Modified Rankin (Stroke  Patients Only)       Balance                                            Cognition Arousal/Alertness: Awake/alert Behavior During Therapy: WFL for tasks assessed/performed Overall Cognitive Status: Within Functional Limits for tasks assessed                                 General Comments: AxO x 4 very pleasant      Exercises   Total Knee Replacement TE's following HEP handout 10 reps B LE ankle pumps 05 reps towel squeezes 05 reps knee presses 05 reps heel slides  05 reps SAQ's 05 reps SLR's 05 reps ABD Educated on use of gait belt to assist with TE's Followed by ICE    General Comments        Pertinent Vitals/Pain      Home Living                      Prior Function            PT Goals (current goals can now be found in the care plan section) Progress towards PT goals: Progressing toward goals    Frequency    7X/week      PT Plan Current plan remains appropriate    Co-evaluation  AM-PAC PT "6 Clicks" Mobility   Outcome Measure  Help needed turning from your back to your side while in a flat bed without using bedrails?: None Help needed moving from lying on your back to sitting on the side of a flat bed without using bedrails?: None Help needed moving to and from a bed to a chair (including a wheelchair)?: None Help needed standing up from a chair using your arms (e.g., wheelchair or bedside chair)?: None Help needed to walk in hospital room?: None Help needed climbing 3-5 steps with a railing? : A Little 6 Click Score: 23    End of Session Equipment Utilized During Treatment: Gait belt Activity Tolerance: Patient tolerated treatment well Patient left: in chair Nurse Communication: Mobility status(pt ready for D/C after one PT session) PT Visit Diagnosis: Muscle weakness (generalized) (M62.81);Difficulty in walking, not elsewhere classified (R26.2)     Time: 1020-1104 PT Time  Calculation (min) (ACUTE ONLY): 44 min  Charges:  $Gait Training: 8-22 mins $Therapeutic Exercise: 8-22 mins $Therapeutic Activity: 8-22 mins                     Rica Koyanagi  PTA Acute  Rehabilitation Services Pager      [phone] Office      [phone]

## 2020-03-14 NOTE — Progress Notes (Signed)
     Subjective: 3 Days Post-Op Procedure(s) (LRB): TOTAL KNEE ARTHROPLASTY (Left)   Patient reports pain as mild, feels like her pain is much better controlled.  Slept well last night.  No reported events throughout the night.  Ready to be discharged home. If she does well with PT.      Objective:   VITALS:   Vitals:   03/14/20 0040 03/14/20 0635  BP: (!) 127/58 (!) 157/73  Pulse: 64 74  Resp: 17 16  Temp: 98.4 F (36.9 C) 98.2 F (36.8 C)  SpO2: 96% 100%    Dorsiflexion/Plantar flexion intact Incision: dressing C/D/I No cellulitis present Compartment soft  LABS Recent Labs    03/12/20 0339 03/13/20 0313  HGB 9.6* 8.5*  HCT 30.0* 26.9*  WBC 13.8* 14.3*  PLT 268 266    Recent Labs    03/12/20 0339 03/13/20 0313  NA 127* 131*  K 3.7 3.8  BUN 8 10  CREATININE 0.38* 0.54  GLUCOSE 128* 125*     Assessment/Plan: 3 Days Post-Op Procedure(s) (LRB): TOTAL KNEE ARTHROPLASTY (Left)   Up with therapy Discharge home with home health  Follow up in 2 weeks at [facility] Follow up with OLIN,Orphia Mctigue D in 2 weeks.  Contact information:  [facility] [address] [place] [id number]             [name] PA-C  [facility]  [place] [address] [place], [place], [place] [address] Phone: [phone] [email or web address] Facebook  [employer]

## 2020-03-18 DIAGNOSIS — I10 Essential (primary) hypertension: Secondary | ICD-10-CM | POA: Diagnosis not present

## 2020-03-18 DIAGNOSIS — Z96652 Presence of left artificial knee joint: Secondary | ICD-10-CM | POA: Diagnosis not present

## 2020-03-18 DIAGNOSIS — M810 Age-related osteoporosis without current pathological fracture: Secondary | ICD-10-CM | POA: Diagnosis not present

## 2020-03-18 DIAGNOSIS — M5136 Other intervertebral disc degeneration, lumbar region: Secondary | ICD-10-CM | POA: Diagnosis not present

## 2020-03-18 DIAGNOSIS — Z79891 Long term (current) use of opiate analgesic: Secondary | ICD-10-CM | POA: Diagnosis not present

## 2020-03-18 DIAGNOSIS — M1711 Unilateral primary osteoarthritis, right knee: Secondary | ICD-10-CM | POA: Diagnosis not present

## 2020-03-18 DIAGNOSIS — E7800 Pure hypercholesterolemia, unspecified: Secondary | ICD-10-CM | POA: Diagnosis not present

## 2020-03-18 DIAGNOSIS — Z471 Aftercare following joint replacement surgery: Secondary | ICD-10-CM | POA: Diagnosis not present

## 2020-03-18 DIAGNOSIS — Z79899 Other long term (current) drug therapy: Secondary | ICD-10-CM | POA: Diagnosis not present

## 2020-03-18 DIAGNOSIS — Z7901 Long term (current) use of anticoagulants: Secondary | ICD-10-CM | POA: Diagnosis not present

## 2020-03-19 NOTE — Discharge Summary (Signed)
Physician Discharge Summary  Patient ID: MIAMARIE FIFIELD MRN: [id number] DOB/AGE: 84-Aug-1935 84 y.o.  Admit date: 03/11/2020 Discharge date: 03/14/2020   Procedures:  Procedure(s) (LRB): TOTAL KNEE ARTHROPLASTY (Left)  Attending Physician:  Dr. Paralee Cancel   Admission Diagnoses:   Left knee primary OA / pain  Discharge Diagnoses:  Principal Problem:   S/P left TKA Active Problems:   Status post left knee replacement  Past Medical History:  Diagnosis Date  . Allergy   . Anemia   . Arthritis   . Barrett esophagus   . Barrett's esophagus 11/02/2007   Qualifier: Diagnosis of  By: Mat Carne    . Blood transfusion without reported diagnosis    late 20's or early 30's  . Cataract   . Diverticulosis   . ECTOPIC PREGNANCY 02/08/2008   Qualifier: Diagnosis of  By: Mat Carne    . Family history of adverse reaction to anesthesia    older sister dont remember what it was   . Fibromyalgia    takes elavil  . Gastritis   . GASTRITIS 11/02/2007   Qualifier: Diagnosis of  By: Mat Carne    . GERD (gastroesophageal reflux disease)   . Glaucoma    right eye  . Heart murmur   . Hiatal hernia   . History of GI diverticular bleed   . Hyperlipidemia   . Hypertension   . Iron deficiency 2020  . Iron deficiency anemia   . Osteoporosis   . Palpitations 04/02/2010   Qualifier: Diagnosis of  By: Angelena Form, MD, Harrell Gave    . PONV (postoperative nausea and vomiting)    after c section  . Presbyesophagus   . Stroke [facility])    "small stroke" 2010- no residual per pt    HPI:    Terri FOURNET, 86 y.o. female, has a history of pain and functional disability in the left knee due to arthritis and has failed non-surgical conservative treatments for greater than 12 weeks to include NSAID's and/or analgesics, corticosteriod injections and activity modification.  Onset of symptoms was gradual, starting 3 years ago with gradually worsening course since that time. The patient  noted no past surgery on the left knee(s).  Patient currently rates pain in the left knee(s) at 8 out of 10 with activity. Patient has night pain, worsening of pain with activity and weight bearing, pain that interferes with activities of daily living and pain with passive range of motion.  Patient has evidence of periarticular osteophytes and joint space narrowing by imaging studies.  There is no active infection.  Risks, benefits and expectations were discussed with the patient.  Risks including but not limited to the risk of anesthesia, blood clots, nerve damage, blood vessel damage, failure of the prosthesis, infection and up to and including death.  Patient understand the risks, benefits and expectations and wishes to proceed with surgery.   PCP: Shon Baton, MD   Discharged Condition: good  Hospital Course:  Patient underwent the above stated procedure on 03/11/2020. Patient tolerated the procedure well and brought to the recovery room in good condition and subsequently to the floor.  POD #1 BP: 168/64 ; Pulse: 66 ; Temp: 98.2 F (36.8 C) ; Resp: 16 Patient reports pain as moderate/severe, some control with medication. No reported events throughout the night. Discussed the procedure and expectations moving forward.  She mentioned going home, but after a discussion she will see how she progress today.  Plan for discharge possibly tomorrowdue  to underlying medical co-morbidities, pain control and need for inpatient therapy to meet goal of being discharged home safely with family/caregiver. Dorsiflexion/plantar flexion intact, incision: dressing C/D/I, no cellulitis present and compartment soft.   LABS  Basename    HGB     9.6  HCT     30.0   POD #2  BP: 174/71 ; Pulse: 78 ; Temp: 98.8 F (37.1 C) ; Resp: 18 Patient reports pain as moderate.  She had an incident this AM when went out on the nurse/tech.  She is doing well at the time of being seen.  Possible cause is analgesic medications.  Discussed using the least amount to control her pain. She seems to be doing ok now, but discussed keeping her another day to see how she does with analgesic meds and PT.   Plan for discharge possibly tomorrowdue to underlying medical co-morbidities, pain control and need for inpatient therapy to meet goal of being discharged home safely with family/caregiver. Dorsiflexion/plantar flexion intact, incision: dressing C/D/I, no cellulitis present and compartment soft.   LABS  Basename    HGB     8.5  HCT     26.9   POD #3  BP: 157/73 ; Pulse: 74 ; Temp: 98.2 F (36.8 C) ; Resp: 16 Patient reports pain as mild, feels like her pain is much better controlled.  Slept well last night.  No reported events throughout the night.  Ready to be discharged home. Dorsiflexion/plantar flexion intact, incision: dressing C/D/I, no cellulitis present and compartment soft.   LABS   No new labs  Discharge Exam: General appearance: alert, cooperative and no distress Extremities: Homans sign is negative, no sign of DVT, no edema, redness or tenderness in the calves or thighs and no ulcers, gangrene or trophic changes  Disposition: Home with follow up in 2 weeks   Follow-up Information    Paralee Cancel, MD. Go on 03/26/2020.   Specialty: Orthopedic Surgery Why: You are scheduled for a post-operative appointment on 03-26-20 at 2:45 pm.  Contact information: [address] [place] [address] [phone]        [facility] Follow up.   Specialty: Home Health Services Contact information: [place] [address] [phone]           Discharge Instructions    Call MD / Call 911   Complete by: As directed    If you experience chest pain or shortness of breath, CALL 911 and be transported to the hospital emergency room.  If you develope a fever above 101 F, pus (white drainage) or increased drainage or redness at the wound, or calf pain, call your surgeon's  office.   Change dressing   Complete by: As directed    Maintain surgical dressing until follow up in the clinic. If the edges start to pull up, may reinforce with tape. If the dressing is no longer working, may remove and cover with gauze and tape, but must keep the area dry and clean.  Call with any questions or concerns.   Constipation Prevention   Complete by: As directed    Drink plenty of fluids.  Prune juice may be helpful.  You may use a stool softener, such as Colace (over the counter) 100 mg twice a day.  Use MiraLax (over the counter) for constipation as needed.   Diet - low sodium heart healthy   Complete by: As directed    Discharge instructions  Complete by: As directed    Maintain surgical dressing until follow up in the clinic. If the edges start to pull up, may reinforce with tape. If the dressing is no longer working, may remove and cover with gauze and tape, but must keep the area dry and clean.  Follow up in 2 weeks at [facility]. Call with any questions or concerns.   Increase activity slowly as tolerated   Complete by: As directed    Weight bearing as tolerated with assist device (walker, cane, etc) as directed, use it as long as suggested by your surgeon or therapist, typically at least 4-6 weeks.   TED hose   Complete by: As directed    Use stockings (TED hose) for 2 weeks on both leg(s).  You may remove them at night for sleeping.      Allergies as of 03/14/2020      Reactions   Codeine Other (See Comments)   "Perception off"       Medication List    STOP taking these medications   acetaminophen 500 MG tablet Commonly known as: TYLENOL   traMADol 50 MG tablet Commonly known as: ULTRAM     TAKE these medications   amitriptyline 25 MG tablet Commonly known as: ELAVIL Take 25 mg by mouth at bedtime as needed for sleep.   atorvastatin 20 MG tablet Commonly known as: LIPITOR Take 20 mg by mouth daily.   benazepril 20 MG tablet Commonly known as:  LOTENSIN Take 20 mg by mouth daily.   BIOFREEZE ROLL-ON EX Apply 1 application topically 3 (three) times daily as needed (pain.).   calcium carbonate 750 MG chewable tablet Commonly known as: TUMS EX Chew 1 tablet by mouth daily as needed for heartburn.   denosumab 60 MG/ML Soln injection Commonly known as: PROLIA Inject 60 mg into the skin every 6 (six) months. Administer in upper arm, thigh, or abdomen   docusate sodium 100 MG capsule Commonly known as: Colace Take 1 capsule (100 mg total) by mouth 2 (two) times daily.   dorzolamide-timolol 22.3-6.8 MG/ML ophthalmic solution Commonly known as: COSOPT Place 1 drop into the right eye 2 (two) times daily.   fluticasone 50 MCG/ACT nasal spray Commonly known as: FLONASE Place 1 spray into both nostrils every other day.   hydrochlorothiazide 25 MG tablet Commonly known as: HYDRODIURIL Take 25 mg by mouth every other day.   HYDROcodone-acetaminophen 5-325 MG tablet Commonly known as: Norco Take 1-2 tablets by mouth every 4 (four) hours as needed for moderate pain or severe pain.   latanoprost 0.005 % ophthalmic solution Commonly known as: XALATAN Place 1 drop into the right eye at bedtime.   loratadine 10 MG tablet Commonly known as: CLARITIN Take 10 mg by mouth every other day.   Magnesium 250 MG Tabs Take 250 mg by mouth every other day.   METAMUCIL PO Take 1 each by mouth daily. 1 heaping teaspoon daily   methocarbamol 500 MG tablet Commonly known as: Robaxin Take 1 tablet (500 mg total) by mouth every 6 (six) hours as needed for muscle spasms.   omeprazole 20 MG capsule Commonly known as: PRILOSEC Take 20 mg by mouth daily.   polyethylene glycol 17 g packet Commonly known as: MIRALAX / GLYCOLAX Take 17 g by mouth 2 (two) times daily.   polyvinyl alcohol 1.4 % ophthalmic solution Commonly known as: LIQUIFILM TEARS Place 1 drop into both eyes 3 (three) times daily as needed for dry eyes.   prednisoLONE  acetate 1 % ophthalmic suspension Commonly known as: PRED FORTE Place 1 drop into the right eye every other day.   rivaroxaban 10 MG Tabs tablet Commonly known as: Xarelto Take 1 tablet (10 mg total) by mouth daily.   Saline 2.65 % Soln Place 1 each into the nose daily as needed (congestion).   SALONPAS EX Apply 1 application topically daily as needed (pain.).   Slow Fe 142 (45 Fe) MG Tbcr Generic drug: Ferrous Sulfate Take 45 mg by mouth every other day.   vitamin B-12 1000 MCG tablet Commonly known as: CYANOCOBALAMIN Take 1,000 mcg by mouth daily.   Vitamin D (Cholecalciferol) 50 MCG (2000 UT) Caps Take 2,000 Units by mouth daily.            Discharge Care Instructions  (From admission, onward)         Start     Ordered   03/14/20 0000  Change dressing    Comments: Maintain surgical dressing until follow up in the clinic. If the edges start to pull up, may reinforce with tape. If the dressing is no longer working, may remove and cover with gauze and tape, but must keep the area dry and clean.  Call with any questions or concerns.   03/14/20 0844           Signed: West Pugh. Maebel Marasco   PA-C  03/19/2020, 9:03 AM

## 2020-03-20 DIAGNOSIS — Z79891 Long term (current) use of opiate analgesic: Secondary | ICD-10-CM | POA: Diagnosis not present

## 2020-03-20 DIAGNOSIS — M5136 Other intervertebral disc degeneration, lumbar region: Secondary | ICD-10-CM | POA: Diagnosis not present

## 2020-03-20 DIAGNOSIS — I10 Essential (primary) hypertension: Secondary | ICD-10-CM | POA: Diagnosis not present

## 2020-03-20 DIAGNOSIS — Z471 Aftercare following joint replacement surgery: Secondary | ICD-10-CM | POA: Diagnosis not present

## 2020-03-20 DIAGNOSIS — Z96652 Presence of left artificial knee joint: Secondary | ICD-10-CM | POA: Diagnosis not present

## 2020-03-20 DIAGNOSIS — Z7901 Long term (current) use of anticoagulants: Secondary | ICD-10-CM | POA: Diagnosis not present

## 2020-03-20 DIAGNOSIS — M810 Age-related osteoporosis without current pathological fracture: Secondary | ICD-10-CM | POA: Diagnosis not present

## 2020-03-20 DIAGNOSIS — M1711 Unilateral primary osteoarthritis, right knee: Secondary | ICD-10-CM | POA: Diagnosis not present

## 2020-03-20 DIAGNOSIS — E7800 Pure hypercholesterolemia, unspecified: Secondary | ICD-10-CM | POA: Diagnosis not present

## 2020-03-20 DIAGNOSIS — Z79899 Other long term (current) drug therapy: Secondary | ICD-10-CM | POA: Diagnosis not present

## 2020-03-24 DIAGNOSIS — M810 Age-related osteoporosis without current pathological fracture: Secondary | ICD-10-CM | POA: Diagnosis not present

## 2020-03-24 DIAGNOSIS — Z79899 Other long term (current) drug therapy: Secondary | ICD-10-CM | POA: Diagnosis not present

## 2020-03-24 DIAGNOSIS — M5136 Other intervertebral disc degeneration, lumbar region: Secondary | ICD-10-CM | POA: Diagnosis not present

## 2020-03-24 DIAGNOSIS — I10 Essential (primary) hypertension: Secondary | ICD-10-CM | POA: Diagnosis not present

## 2020-03-24 DIAGNOSIS — E7800 Pure hypercholesterolemia, unspecified: Secondary | ICD-10-CM | POA: Diagnosis not present

## 2020-03-24 DIAGNOSIS — Z471 Aftercare following joint replacement surgery: Secondary | ICD-10-CM | POA: Diagnosis not present

## 2020-03-24 DIAGNOSIS — Z96652 Presence of left artificial knee joint: Secondary | ICD-10-CM | POA: Diagnosis not present

## 2020-03-24 DIAGNOSIS — M1711 Unilateral primary osteoarthritis, right knee: Secondary | ICD-10-CM | POA: Diagnosis not present

## 2020-03-24 DIAGNOSIS — Z79891 Long term (current) use of opiate analgesic: Secondary | ICD-10-CM | POA: Diagnosis not present

## 2020-03-24 DIAGNOSIS — Z7901 Long term (current) use of anticoagulants: Secondary | ICD-10-CM | POA: Diagnosis not present

## 2020-03-25 ENCOUNTER — Ambulatory Visit: Payer: Medicare HMO | Admitting: Physical Therapy

## 2020-03-26 DIAGNOSIS — Z96652 Presence of left artificial knee joint: Secondary | ICD-10-CM | POA: Diagnosis not present

## 2020-03-26 DIAGNOSIS — M1711 Unilateral primary osteoarthritis, right knee: Secondary | ICD-10-CM | POA: Diagnosis not present

## 2020-03-26 DIAGNOSIS — M5136 Other intervertebral disc degeneration, lumbar region: Secondary | ICD-10-CM | POA: Diagnosis not present

## 2020-03-26 DIAGNOSIS — Z79891 Long term (current) use of opiate analgesic: Secondary | ICD-10-CM | POA: Diagnosis not present

## 2020-03-26 DIAGNOSIS — Z471 Aftercare following joint replacement surgery: Secondary | ICD-10-CM | POA: Diagnosis not present

## 2020-03-26 DIAGNOSIS — I10 Essential (primary) hypertension: Secondary | ICD-10-CM | POA: Diagnosis not present

## 2020-03-26 DIAGNOSIS — E7800 Pure hypercholesterolemia, unspecified: Secondary | ICD-10-CM | POA: Diagnosis not present

## 2020-03-26 DIAGNOSIS — Z79899 Other long term (current) drug therapy: Secondary | ICD-10-CM | POA: Diagnosis not present

## 2020-03-26 DIAGNOSIS — M810 Age-related osteoporosis without current pathological fracture: Secondary | ICD-10-CM | POA: Diagnosis not present

## 2020-03-26 DIAGNOSIS — Z7901 Long term (current) use of anticoagulants: Secondary | ICD-10-CM | POA: Diagnosis not present

## 2020-03-27 ENCOUNTER — Ambulatory Visit: Payer: Medicare HMO | Admitting: Physical Therapy

## 2020-03-28 DIAGNOSIS — M810 Age-related osteoporosis without current pathological fracture: Secondary | ICD-10-CM | POA: Diagnosis not present

## 2020-03-28 DIAGNOSIS — Z79899 Other long term (current) drug therapy: Secondary | ICD-10-CM | POA: Diagnosis not present

## 2020-03-28 DIAGNOSIS — M1711 Unilateral primary osteoarthritis, right knee: Secondary | ICD-10-CM | POA: Diagnosis not present

## 2020-03-28 DIAGNOSIS — E7800 Pure hypercholesterolemia, unspecified: Secondary | ICD-10-CM | POA: Diagnosis not present

## 2020-03-28 DIAGNOSIS — I10 Essential (primary) hypertension: Secondary | ICD-10-CM | POA: Diagnosis not present

## 2020-03-28 DIAGNOSIS — Z471 Aftercare following joint replacement surgery: Secondary | ICD-10-CM | POA: Diagnosis not present

## 2020-03-28 DIAGNOSIS — Z79891 Long term (current) use of opiate analgesic: Secondary | ICD-10-CM | POA: Diagnosis not present

## 2020-03-28 DIAGNOSIS — Z7901 Long term (current) use of anticoagulants: Secondary | ICD-10-CM | POA: Diagnosis not present

## 2020-03-28 DIAGNOSIS — Z96652 Presence of left artificial knee joint: Secondary | ICD-10-CM | POA: Diagnosis not present

## 2020-03-28 DIAGNOSIS — M5136 Other intervertebral disc degeneration, lumbar region: Secondary | ICD-10-CM | POA: Diagnosis not present

## 2020-03-31 ENCOUNTER — Encounter: Payer: Self-pay | Admitting: Physical Therapy

## 2020-03-31 ENCOUNTER — Encounter: Payer: Medicare HMO | Admitting: Physical Therapy

## 2020-03-31 ENCOUNTER — Ambulatory Visit: Payer: Medicare HMO | Attending: Physical Medicine and Rehabilitation | Admitting: Physical Therapy

## 2020-03-31 ENCOUNTER — Other Ambulatory Visit: Payer: Self-pay

## 2020-03-31 DIAGNOSIS — G8929 Other chronic pain: Secondary | ICD-10-CM | POA: Diagnosis present

## 2020-03-31 DIAGNOSIS — M25562 Pain in left knee: Secondary | ICD-10-CM | POA: Diagnosis present

## 2020-03-31 DIAGNOSIS — M25662 Stiffness of left knee, not elsewhere classified: Secondary | ICD-10-CM | POA: Insufficient documentation

## 2020-03-31 DIAGNOSIS — M5442 Lumbago with sciatica, left side: Secondary | ICD-10-CM | POA: Diagnosis present

## 2020-03-31 DIAGNOSIS — R262 Difficulty in walking, not elsewhere classified: Secondary | ICD-10-CM

## 2020-03-31 DIAGNOSIS — R600 Localized edema: Secondary | ICD-10-CM | POA: Diagnosis present

## 2020-03-31 NOTE — Patient Instructions (Signed)
Access Code: [email or web address] URL: [email or web address] Date: 03/31/2020 Prepared by: Lum Babe  Exercises Seated Knee Extension Stretch with Chair - 3 x daily - 7 x weekly - 3 sets - 3 reps - 30 hold Seated Knee Flexion Stretch - 3 x daily - 7 x weekly - 3 sets - 3 reps - 30 hold Supine Short Arc Quad - 3 x daily - 7 x weekly - 1 sets - 10 reps - 3 hold

## 2020-03-31 NOTE — Therapy (Signed)
[facility] [place], [place], [address] Phone: [phone]   Fax:  [phone]  Physical Therapy Evaluation  Patient Details  Name: Terri Shah MRN: [id number] Date of Birth: [birth year] Referring Provider (PT): Cathe Mons Date: 03/31/2020  PT End of Session - 03/31/20 1352    Visit Number  1    Date for PT Re-Evaluation  05/31/20    Authorization Type  [employer]    PT Start Time  1312    PT Stop Time  1410    PT Time Calculation (min)  58 min    Activity Tolerance  Patient tolerated treatment well    Behavior During Therapy  [facility] for tasks assessed/performed       Past Medical History:  Diagnosis Date  . Allergy   . Anemia   . Arthritis   . Barrett esophagus   . Barrett's esophagus 11/02/2007   Qualifier: Diagnosis of  By: Mat Carne    . Blood transfusion without reported diagnosis    late 20's or early 30's  . Cataract   . Diverticulosis   . ECTOPIC PREGNANCY 02/08/2008   Qualifier: Diagnosis of  By: Mat Carne    . Family history of adverse reaction to anesthesia    older sister dont remember what it was   . Fibromyalgia    takes elavil  . Gastritis   . GASTRITIS 11/02/2007   Qualifier: Diagnosis of  By: Mat Carne    . GERD (gastroesophageal reflux disease)   . Glaucoma    right eye  . Heart murmur   . Hiatal hernia   . History of GI diverticular bleed   . Hyperlipidemia   . Hypertension   . Iron deficiency 2020  . Iron deficiency anemia   . Osteoporosis   . Palpitations 04/02/2010   Qualifier: Diagnosis of  By: Angelena Form, MD, Harrell Gave    . PONV (postoperative nausea and vomiting)    after c section  . Presbyesophagus   . Stroke [facility])    "small stroke" 2010- no residual per pt    Past Surgical History:  Procedure Laterality Date  . CATARACT EXTRACTION, BILATERAL    . CESAREAN SECTION  1950-1960s   x 3  . CHOLECYSTECTOMY    . COLONOSCOPY  N/A 12/19/2015   Procedure: COLONOSCOPY;  Surgeon: Manus Gunning, MD;  Location: [facility];  Service: Gastroenterology;  Laterality: N/A;  . COLONOSCOPY    . [place]  . FACIAL COSMETIC SURGERY    . LAPAROSCOPIC CHOLECYSTECTOMY SINGLE SITE WITH INTRAOPERATIVE CHOLANGIOGRAM N/A 10/27/2014   Procedure: LAPAROSCOPIC CHOLECYSTECTOMY SINGLE SITE WITH INTRAOPERATIVE CHOLANGIOGRAM;  Surgeon: Elishua Radford Boston, MD;  Location: [facility];  Service: General;  Laterality: N/A;  . TOTAL KNEE ARTHROPLASTY Left 03/11/2020   Procedure: TOTAL KNEE ARTHROPLASTY;  Surgeon: Paralee Cancel, MD;  Location: [facility];  Service: Orthopedics;  Laterality: Left;  70 mins  . TUBAL LIGATION    . UPPER GASTROINTESTINAL ENDOSCOPY      There were no vitals filed for this visit.   Subjective Assessment - 03/31/20 1314    Subjective  Patient underwent a left TKA 03/11/20.  She went home and had some home PT.  REports that she feels like she is doing okay.  Reports doing okay walking at home downstairs, has not tried upstairs.  She has been having help at home with family and friends  Pertinent History  arthritis, OP, HTN,  "small stroke"    Limitations  Standing;Walking;House hold activities    How long can you walk comfortably?  100 feet slow with FWW    Patient Stated Goals  have good motion, less pain , walk better.    Currently in Pain?  Yes    Pain Score  3     Pain Location  Knee    Pain Orientation  Left;Medial    Pain Descriptors / Indicators  Sharp    Pain Radiating Towards  some pain in the calf    Pain Onset  1 to 4 weeks ago    Pain Frequency  Constant    Aggravating Factors   walking , bending pain up to 10/10 at times    Pain Relieving Factors  ice, pain meds pain at best a 2/10    Effect of Pain on Daily Activities  limits all ADL's.  not able to do stairs, difficulty walking         [facility] PT Assessment - 03/31/20 0001      Assessment   Medical Diagnosis  s/p left TKA     Referring Provider (PT)  Alvan Dame    Onset Date/Surgical Date  03/11/20    Prior Therapy  [facility]      Precautions   Precautions  None      Restrictions   Weight Bearing Restrictions  No      Balance Screen   Has the patient fallen in the past 6 months  No    Has the patient had a decrease in activity level because of a fear of falling?   No    Is the patient reluctant to leave their home because of a fear of falling?   No      Home Environment   Additional Comments  has stairs, does some housework and yardwork      Prior Function   Level of Independence  Independent    Vocation  Retired    Leisure  no exercise in the past few years      Observation/Other Assessments-Edema    Edema  Circumferential      Circumferential Edema   Circumferential - Right  37 cm mid patella    Circumferential - Left   42.5 cm       AROM   Left Knee Extension  20    Left Knee Flexion  55      PROM   Left Knee Extension  10    Left Knee Flexion  62      Strength   Overall Strength Comments  in the available ROM 3+/5      Ambulation/Gait   Gait Comments  uses FWW, slow step through pattern      Standardized Balance Assessment   Standardized Balance Assessment  Timed Up and Go Test      Timed Up and Go Test   Normal TUG (seconds)  31    TUG Comments  FWW                Objective measurements completed on examination: See above findings.      [facility] Adult PT Treatment/Exercise - 03/31/20 0001      Exercises   Exercises  Knee/Hip      Knee/Hip Exercises: Aerobic   Nustep  level 3 x 5 minutes      Modalities   Modalities  Vasopneumatic      Acupuncturist  Stimulation Location  lumbar area    Electrical Stimulation Action  IFC    Electrical Stimulation Parameters  supine    Electrical Stimulation Goals  Pain      Vasopneumatic   Number Minutes Vasopneumatic   15 minutes    Vasopnuematic Location   Knee    Vasopneumatic Pressure  Medium     Vasopneumatic Temperature   40             PT Education - 03/31/20 1351    Education Details  LLLD stertches for flexion and extension    Person(s) Educated  Patient    Methods  Explanation;Handout;Demonstration    Comprehension  Verbalized understanding       PT Short Term Goals - 03/31/20 1357      PT SHORT TERM GOAL #1   Title  Ind with intial HEP    Time  2    Period  Weeks    Status  New        PT Long Term Goals - 03/31/20 1357      PT LONG TERM GOAL #1   Title  independent with RICE    Time  8    Period  Weeks    Status  New      PT LONG TERM GOAL #2   Title  decrease pain 50%    Time  8    Period  Weeks    Status  New      PT LONG TERM GOAL #3   Title  increase left knee aROM to 5-115 degrees flexion    Time  8    Period  Weeks    Status  New      PT LONG TERM GOAL #4   Title  walk with SPC or without device for all household mobility    Time  8    Period  Weeks    Status  New      PT LONG TERM GOAL #5   Title  go up and down stairs step over step    Time  12    Period  Weeks    Status  New             Plan - 03/31/20 1353    Clinical Impression Statement  [name] underwent a left TKA on 03/11/20.  She reports that she has had some pain and has been slow with walking.  She is using a FWW, slow gait, TUG 31 seconds, she has significant swelling that is limiting her ROM, the swelling is 5.5 cm great on the left than the non involved leg.  ROM was very poor.  Patient does have long standing LBP    Personal Factors and Comorbidities  Comorbidity 3+    Comorbidities  OP, HTN, arthritis, "small stroke", long standing LBP    Stability/Clinical Decision Making  Evolving/Moderate complexity    PT Treatment/Interventions  ADLs/Self Care Home Management;Cryotherapy;Electrical Stimulation;Moist Heat;Ultrasound;Therapeutic activities;Therapeutic exercise;Neuromuscular re-education;Manual techniques;Patient/family education;Vasopneumatic  Device;Functional mobility training;Stair training;Gait training    PT Next Visit Plan  start getting the knee moving, address pain and swelling as needed    Consulted and Agree with Plan of Care  Patient       Patient will benefit from skilled therapeutic intervention in order to improve the following deficits and impairments:  Pain, Decreased strength, Decreased range of motion, Abnormal gait, Improper body mechanics, Increased muscle spasms, Decreased mobility, Cardiopulmonary status limiting activity, Postural dysfunction, Difficulty walking, Impaired flexibility, Decreased  activity tolerance, Decreased balance, Increased edema  Visit Diagnosis: Acute pain of left knee - Plan: PT plan of care cert/re-cert  Stiffness of left knee, not elsewhere classified - Plan: PT plan of care cert/re-cert  Difficulty in walking, not elsewhere classified - Plan: PT plan of care cert/re-cert  Localized edema - Plan: PT plan of care cert/re-cert     Problem List Patient Active Problem List   Diagnosis Date Noted  . Status post left knee replacement 03/14/2020  . S/P left TKA 03/11/2020  . Preoperative clearance 02/22/2019  . Colon polyp   . Lower GI bleed 12/18/2015  . Diverticulosis of colon with hemorrhage   . Nausea with vomiting 12/02/2014  . Ileus, postoperative ([place]) 11/04/2014  . Hyponatremia 11/04/2014  . Abdominal fluid collection   . Chest pain 10/26/2014  . Acute calculous cholecystitis 10/26/2014  . Barrett esophagus   . History of GI diverticular bleed   . Essential hypertension   . Dyspnea 01/18/2012  . Aortic insufficiency 01/18/2012  . ANEMIA, IRON DEFICIENCY 06/04/2010  . HYPERCHOLESTEROLEMIA 02/08/2008  . GERD 02/08/2008  . ARTHRITIS 02/08/2008  . HIATAL HERNIA 11/02/2007  . DIVERTICULOSIS OF COLON 04/07/2004    Sumner Boast., PT 03/31/2020, 2:10 PM  [place] [address] [place], [place],  [address] Phone: [phone]   Fax:  [phone]  Name: Terri Shah MRN: [id number] Date of Birth: 1934-07-09

## 2020-04-02 ENCOUNTER — Ambulatory Visit: Payer: Medicare HMO | Admitting: Physical Therapy

## 2020-04-02 ENCOUNTER — Encounter: Payer: Self-pay | Admitting: Physical Therapy

## 2020-04-02 ENCOUNTER — Other Ambulatory Visit: Payer: Self-pay

## 2020-04-02 DIAGNOSIS — R262 Difficulty in walking, not elsewhere classified: Secondary | ICD-10-CM

## 2020-04-02 DIAGNOSIS — G8929 Other chronic pain: Secondary | ICD-10-CM | POA: Diagnosis not present

## 2020-04-02 DIAGNOSIS — R600 Localized edema: Secondary | ICD-10-CM

## 2020-04-02 DIAGNOSIS — M25562 Pain in left knee: Secondary | ICD-10-CM | POA: Diagnosis not present

## 2020-04-02 DIAGNOSIS — M25662 Stiffness of left knee, not elsewhere classified: Secondary | ICD-10-CM | POA: Diagnosis not present

## 2020-04-02 DIAGNOSIS — M5442 Lumbago with sciatica, left side: Secondary | ICD-10-CM

## 2020-04-02 NOTE — Therapy (Signed)
[place], [place], [address] Phone: [phone]   Fax:  [phone]  Physical Therapy Treatment  Patient Details  Name: Terri Shah MRN: [id number] Date of Birth: 07-17-1934 Referring Provider (PT): Alvan Dame   Encounter Date: 04/02/2020  PT End of Session - 04/02/20 1135    Visit Number  2    Date for PT Re-Evaluation  05/31/20    PT Start Time  [id number]    PT Stop Time  1151    PT Time Calculation (min)  60 min    Activity Tolerance  Patient tolerated treatment well    Behavior During Therapy  [facility] for tasks assessed/performed       Past Medical History:  Diagnosis Date  . Allergy   . Anemia   . Arthritis   . Barrett esophagus   . Barrett's esophagus 11/02/2007   Qualifier: Diagnosis of  By: Mat Carne    . Blood transfusion without reported diagnosis    late 20's or early 30's  . Cataract   . Diverticulosis   . ECTOPIC PREGNANCY 02/08/2008   Qualifier: Diagnosis of  By: Mat Carne    . Family history of adverse reaction to anesthesia    older sister dont remember what it was   . Fibromyalgia    takes elavil  . Gastritis   . GASTRITIS 11/02/2007   Qualifier: Diagnosis of  By: Mat Carne    . GERD (gastroesophageal reflux disease)   . Glaucoma    right eye  . Heart murmur   . Hiatal hernia   . History of GI diverticular bleed   . Hyperlipidemia   . Hypertension   . Iron deficiency 2020  . Iron deficiency anemia   . Osteoporosis   . Palpitations 04/02/2010   Qualifier: Diagnosis of  By: Angelena Form, MD, Harrell Gave    . PONV (postoperative nausea and vomiting)    after c section  . Presbyesophagus   . Stroke [facility])    "small stroke" 2010- no residual per pt    Past Surgical History:  Procedure Laterality Date  . CATARACT EXTRACTION, BILATERAL    . CESAREAN SECTION  1950-1960s   x 3  . CHOLECYSTECTOMY    . COLONOSCOPY N/A 12/19/2015   Procedure: COLONOSCOPY;   Surgeon: Manus Gunning, MD;  Location: [facility];  Service: Gastroenterology;  Laterality: N/A;  . COLONOSCOPY    . [place]  . FACIAL COSMETIC SURGERY    . LAPAROSCOPIC CHOLECYSTECTOMY SINGLE SITE WITH INTRAOPERATIVE CHOLANGIOGRAM N/A 10/27/2014   Procedure: LAPAROSCOPIC CHOLECYSTECTOMY SINGLE SITE WITH INTRAOPERATIVE CHOLANGIOGRAM;  Surgeon: Neita Landrigan Boston, MD;  [facility];  Service: General;  Laterality: N/A;  . TOTAL KNEE ARTHROPLASTY Left 03/11/2020   Procedure: TOTAL KNEE ARTHROPLASTY;  Surgeon: Paralee Cancel, MD;  [facility];  Service: Orthopedics;  Laterality: Left;  70 mins  . TUBAL LIGATION    . UPPER GASTROINTESTINAL ENDOSCOPY      There were no vitals filed for this visit.  Subjective Assessment - 04/02/20 1057    Subjective  Pt reports increased stiffness this morning; thinks her swelling has gone down a little. Patient reports 3/10 pain in L knee.    Pertinent History  arthritis, OP, HTN,  "small stroke"    Limitations  Standing;Walking;House hold activities    Currently in Pain?  Yes    Pain Score  3  Pain Location  Knee    Pain Orientation  Left;Medial         [facility] PT Assessment - 04/02/20 0001      PROM   Left Knee Flexion  72                   [facility] Adult PT Treatment/Exercise - 04/02/20 0001      Ambulation/Gait   Gait Comments  with SPC, some cues for knee bend and to decrease hip hike, some tactile cues for knee bend, at times lost balance requiring CGA/Min A      Lumbar Exercises: Aerobic   Nustep  level 2 x 6 min      Knee/Hip Exercises: Standing   Heel Raises  10 reps;2 sets    Heel Raises Limitations  RW to stabilize    Knee Flexion  10 reps;2 sets    Knee Flexion Limitations  RW to stabilize; verbal/tactile cues to decrease hip hike      Knee/Hip Exercises: Seated   Long Arc Quad  2 sets;10 reps   pressure on quad; stabilization behind back   Heel Slides  1 set;15 reps    pillowcase under foot   Heel Slides Limitations  5 reps LLLD knee flexion      Knee/Hip Exercises: Supine   Short Arc Quad Sets  10 reps;2 sets;1 set    Short Arc Quad Sets Limitations  bolster under knee; stab to L quad      Moist Heat Therapy   Number Minutes Moist Heat  15 Minutes    Moist Heat Location  Lumbar Spine      Electrical Stimulation   Electrical Stimulation Location  lumbar area    Electrical Stimulation Action  IFC    Electrical Stimulation Parameters  supine    Electrical Stimulation Goals  Pain      Vasopneumatic   Number Minutes Vasopneumatic   15 minutes    Vasopnuematic Location   Knee    Vasopneumatic Pressure  Medium    Vasopneumatic Temperature   40      Manual Therapy   Manual Therapy  Soft tissue mobilization;Joint mobilization;Passive ROM    Joint Mobilization  multidirectional patellar mobilization; inferior joint distraction seated    Soft tissue mobilization  mobilization of scar tissue    Passive ROM  gastroc stretch seated with ankle propped; seated passive knee flexion stretch               PT Short Term Goals - 03/31/20 1357      PT SHORT TERM GOAL #1   Title  Ind with intial HEP    Time  2    Period  Weeks    Status  New        PT Long Term Goals - 03/31/20 1357      PT LONG TERM GOAL #1   Title  independent with RICE    Time  8    Period  Weeks    Status  New      PT LONG TERM GOAL #2   Title  decrease pain 50%    Time  8    Period  Weeks    Status  New      PT LONG TERM GOAL #3   Title  increase left knee aROM to 5-115 degrees flexion    Time  8    Period  Weeks    Status  New      PT  LONG TERM GOAL #4   Title  walk with SPC or without device for all household mobility    Time  8    Period  Weeks    Status  New      PT LONG TERM GOAL #5   Title  go up and down stairs step over step    Time  12    Period  Weeks    Status  New            Plan - 04/02/20 1139    Clinical Impression Statement   Patient  tolerated exercises well today with increased knee flexion ROM following passive and active flexibility exercises. Gait training with SPC; verbal/tactile cues for knee flexion and CGA-minA for occasional LOB. Patient remains swollen and stiff; continue to progress ROM.    PT Next Visit Plan  Continue progressing flexibility of knee, address pain and swelling as indicated    Consulted and Agree with Plan of Care  Patient       Patient will benefit from skilled therapeutic intervention in order to improve the following deficits and impairments:  Pain, Decreased strength, Decreased range of motion, Abnormal gait, Improper body mechanics, Increased muscle spasms, Decreased mobility, Cardiopulmonary status limiting activity, Postural dysfunction, Difficulty walking, Impaired flexibility, Decreased activity tolerance, Decreased balance, Increased edema  Visit Diagnosis: Acute pain of left knee  Stiffness of left knee, not elsewhere classified  Difficulty in walking, not elsewhere classified  Localized edema  Acute bilateral low back pain with left-sided sciatica     Problem List Patient Active Problem List   Diagnosis Date Noted  . Status post left knee replacement 03/14/2020  . S/P left TKA 03/11/2020  . Preoperative clearance 02/22/2019  . Colon polyp   . Lower GI bleed 12/18/2015  . Diverticulosis of colon with hemorrhage   . Nausea with vomiting 12/02/2014  . Ileus, postoperative ([place]) 11/04/2014  . Hyponatremia 11/04/2014  . Abdominal fluid collection   . Chest pain 10/26/2014  . Acute calculous cholecystitis 10/26/2014  . Barrett esophagus   . History of GI diverticular bleed   . Essential hypertension   . Dyspnea 01/18/2012  . Aortic insufficiency 01/18/2012  . ANEMIA, IRON DEFICIENCY 06/04/2010  . HYPERCHOLESTEROLEMIA 02/08/2008  . GERD 02/08/2008  . ARTHRITIS 02/08/2008  . HIATAL HERNIA 11/02/2007  . DIVERTICULOSIS OF COLON 04/07/2004    Sumner Boast., PT 04/02/2020, 11:45 AM  [address] [place], [place], [address] Phone: [phone]   Fax:  [phone]  Name: Terri Shah MRN: [id number] Date of Birth: Nov 08, 1934

## 2020-04-04 ENCOUNTER — Encounter: Payer: Self-pay | Admitting: Physical Therapy

## 2020-04-04 ENCOUNTER — Ambulatory Visit: Payer: Medicare HMO | Admitting: Physical Therapy

## 2020-04-04 ENCOUNTER — Other Ambulatory Visit: Payer: Self-pay

## 2020-04-04 DIAGNOSIS — M25562 Pain in left knee: Secondary | ICD-10-CM | POA: Diagnosis not present

## 2020-04-04 DIAGNOSIS — G8929 Other chronic pain: Secondary | ICD-10-CM | POA: Diagnosis not present

## 2020-04-04 DIAGNOSIS — M5442 Lumbago with sciatica, left side: Secondary | ICD-10-CM | POA: Diagnosis not present

## 2020-04-04 DIAGNOSIS — M25662 Stiffness of left knee, not elsewhere classified: Secondary | ICD-10-CM | POA: Diagnosis not present

## 2020-04-04 DIAGNOSIS — R600 Localized edema: Secondary | ICD-10-CM

## 2020-04-04 DIAGNOSIS — R262 Difficulty in walking, not elsewhere classified: Secondary | ICD-10-CM

## 2020-04-04 NOTE — Therapy (Signed)
[place], [place], [address] Phone: [phone]   Fax:  [phone]  Physical Therapy Treatment  Patient Details  Name: Terri Shah MRN: [id number] Date of Birth: [birth year] Referring Provider (PT): Cathe Mons Date: 04/04/2020  PT End of Session - 04/04/20 1200    Visit Number  3    Date for PT Re-Evaluation  05/31/20    Authorization Type  [employer] Medicare    PT Start Time  1107    PT Stop Time  1209    PT Time Calculation (min)  62 min    Activity Tolerance  Patient tolerated treatment well    Behavior During Therapy  [facility] for tasks assessed/performed       Past Medical History:  Diagnosis Date  . Allergy   . Anemia   . Arthritis   . Barrett esophagus   . Barrett's esophagus 11/02/2007   Qualifier: Diagnosis of  By: Mat Carne    . Blood transfusion without reported diagnosis    late 20's or early 30's  . Cataract   . Diverticulosis   . ECTOPIC PREGNANCY 02/08/2008   Qualifier: Diagnosis of  By: Mat Carne    . Family history of adverse reaction to anesthesia    older sister dont remember what it was   . Fibromyalgia    takes elavil  . Gastritis   . GASTRITIS 11/02/2007   Qualifier: Diagnosis of  By: Mat Carne    . GERD (gastroesophageal reflux disease)   . Glaucoma    right eye  . Heart murmur   . Hiatal hernia   . History of GI diverticular bleed   . Hyperlipidemia   . Hypertension   . Iron deficiency 2020  . Iron deficiency anemia   . Osteoporosis   . Palpitations 04/02/2010   Qualifier: Diagnosis of  By: Angelena Form, MD, Harrell Gave    . PONV (postoperative nausea and vomiting)    after c section  . Presbyesophagus   . Stroke [facility])    "small stroke" 2010- no residual per pt    Past Surgical History:  Procedure Laterality Date  . CATARACT EXTRACTION, BILATERAL    . CESAREAN SECTION  1950-1960s   x 3  . CHOLECYSTECTOMY    . COLONOSCOPY N/A  12/19/2015   Procedure: COLONOSCOPY;  Surgeon: Manus Gunning, MD;  Location: [facility];  Service: Gastroenterology;  Laterality: N/A;  . COLONOSCOPY    . [place]  . FACIAL COSMETIC SURGERY    . LAPAROSCOPIC CHOLECYSTECTOMY SINGLE SITE WITH INTRAOPERATIVE CHOLANGIOGRAM N/A 10/27/2014   Procedure: LAPAROSCOPIC CHOLECYSTECTOMY SINGLE SITE WITH INTRAOPERATIVE CHOLANGIOGRAM;  Surgeon: Michael Boston, MD;  [facility];  Service: General;  Laterality: N/A;  . TOTAL KNEE ARTHROPLASTY Left 03/11/2020   Procedure: TOTAL KNEE ARTHROPLASTY;  Surgeon: Paralee Cancel, MD;  [facility];  Service: Orthopedics;  Laterality: Left;  70 mins  . TUBAL LIGATION    . UPPER GASTROINTESTINAL ENDOSCOPY      There were no vitals filed for this visit.  Subjective Assessment - 04/04/20 1101    Subjective  Pt reports some stiffness/soreness after last tx; resolved after a couple of days. Pt reports 2/10 pain in L knee.    Patient Stated Goals  have good motion, less pain , walk better.    Currently in Pain?  Yes    Pain Score  2  Pain Location  Knee    Pain Orientation  Left;Medial    Pain Descriptors / Indicators  Sharp    Pain Type  Surgical pain                       [facility] Adult PT Treatment/Exercise - 04/04/20 0001      Ambulation/Gait   Gait Comments  with SPC, some cues for knee bend and to decrease hip hike, some tactile cues for knee bend, at times lost balance requiring CGA/Min A      Lumbar Exercises: Aerobic   Nustep  level 2 x 6 min      Knee/Hip Exercises: Standing   Heel Raises  10 reps;2 sets    Heel Raises Limitations  RW to stabilize    Knee Flexion  10 reps;2 sets    Knee Flexion Limitations  RW to stabilize; verbal/tactile cues to decrease hip hike    Forward Step Up  Both    Forward Step Up Limitations  Step tap on 4" step with RW to stabilize      Knee/Hip Exercises: Seated   Long Arc Quad  2 sets;10 reps    Heel  Slides  1 set;15 reps    Heel Slides Limitations  5 reps LLLD knee flexion      Modalities   Modalities  Vasopneumatic      Moist Heat Therapy   Number Minutes Moist Heat  15 Minutes    Moist Heat Location  Lumbar Spine      Vasopneumatic   Number Minutes Vasopneumatic   15 minutes    Vasopnuematic Location   Knee    Vasopneumatic Pressure  Medium    Vasopneumatic Temperature   40      Manual Therapy   Manual Therapy  Joint mobilization;Soft tissue mobilization;Passive ROM    Joint Mobilization  joint distraction with knee extension stretch seated    Soft tissue mobilization  mobilization of scar tissue    Passive ROM  seated passive knee flexion stretch             PT Education - 04/04/20 1159    Education Details  Reviewed HEP and elevation of LE when icing at home    Person(s) Educated  Patient    Methods  Explanation;Demonstration    Comprehension  Verbalized understanding       PT Short Term Goals - 03/31/20 1357      PT SHORT TERM GOAL #1   Title  Ind with intial HEP    Time  2    Period  Weeks    Status  New        PT Long Term Goals - 03/31/20 1357      PT LONG TERM GOAL #1   Title  independent with RICE    Time  8    Period  Weeks    Status  New      PT LONG TERM GOAL #2   Title  decrease pain 50%    Time  8    Period  Weeks    Status  New      PT LONG TERM GOAL #3   Title  increase left knee aROM to 5-115 degrees flexion    Time  8    Period  Weeks    Status  New      PT LONG TERM GOAL #4   Title  walk with SPC or without device for all  household mobility    Time  8    Period  Weeks    Status  New      PT LONG TERM GOAL #5   Title  go up and down stairs step over step    Time  12    Period  Weeks    Status  New            Plan - 04/04/20 1201    Clinical Impression Statement  Patient tolerated exercises well today; knee flexion ROM remains at 72 following passive and active flexibility exercises. Initiated step taps  for stair training progression this rx. Gait training with SPC; verbal/tactile cues for knee flexion. Pt experienced 2 LOB requiring minA; CGA otherwise. Pt remains swollen and stiff; continue to progress ROM.    Comorbidities  OP, HTN, arthritis, "small stroke", long standing LBP    PT Treatment/Interventions  ADLs/Self Care Home Management;Cryotherapy;Electrical Stimulation;Moist Heat;Ultrasound;Therapeutic activities;Therapeutic exercise;Neuromuscular re-education;Manual techniques;Patient/family education;Vasopneumatic Device;Functional mobility training;Stair training;Gait training    PT Next Visit Plan  Continue progressing flexibility of knee, address pain and swelling as indicated    Consulted and Agree with Plan of Care  Patient       Patient will benefit from skilled therapeutic intervention in order to improve the following deficits and impairments:  Pain, Decreased strength, Decreased range of motion, Abnormal gait, Improper body mechanics, Increased muscle spasms, Decreased mobility, Cardiopulmonary status limiting activity, Postural dysfunction, Difficulty walking, Impaired flexibility, Decreased activity tolerance, Decreased balance, Increased edema  Visit Diagnosis: Acute pain of left knee  Stiffness of left knee, not elsewhere classified  Difficulty in walking, not elsewhere classified  Localized edema     Problem List Patient Active Problem List   Diagnosis Date Noted  . Status post left knee replacement 03/14/2020  . S/P left TKA 03/11/2020  . Preoperative clearance 02/22/2019  . Colon polyp   . Lower GI bleed 12/18/2015  . Diverticulosis of colon with hemorrhage   . Nausea with vomiting 12/02/2014  . Ileus, postoperative ([place]) 11/04/2014  . Hyponatremia 11/04/2014  . Abdominal fluid collection   . Chest pain 10/26/2014  . Acute calculous cholecystitis 10/26/2014  . Barrett esophagus   . History of GI diverticular bleed   . Essential hypertension   . Dyspnea  01/18/2012  . Aortic insufficiency 01/18/2012  . ANEMIA, IRON DEFICIENCY 06/04/2010  . HYPERCHOLESTEROLEMIA 02/08/2008  . GERD 02/08/2008  . ARTHRITIS 02/08/2008  . HIATAL HERNIA 11/02/2007  . DIVERTICULOSIS OF COLON 04/07/2004   Amador Cunas, PT, DPT  Terri Shah 04/04/2020, 12:15 PM  [place] [address] [place], [place], [address] Phone: [phone]   Fax:  [phone]  Name: Terri Shah MRN: [id number] Date of Birth: [birth year]

## 2020-04-07 ENCOUNTER — Other Ambulatory Visit: Payer: Self-pay

## 2020-04-07 ENCOUNTER — Encounter: Payer: Self-pay | Admitting: Physical Therapy

## 2020-04-07 ENCOUNTER — Ambulatory Visit: Payer: Medicare HMO | Admitting: Physical Therapy

## 2020-04-07 DIAGNOSIS — R262 Difficulty in walking, not elsewhere classified: Secondary | ICD-10-CM | POA: Diagnosis not present

## 2020-04-07 DIAGNOSIS — M25562 Pain in left knee: Secondary | ICD-10-CM

## 2020-04-07 DIAGNOSIS — R600 Localized edema: Secondary | ICD-10-CM | POA: Diagnosis not present

## 2020-04-07 DIAGNOSIS — M5442 Lumbago with sciatica, left side: Secondary | ICD-10-CM | POA: Diagnosis not present

## 2020-04-07 DIAGNOSIS — G8929 Other chronic pain: Secondary | ICD-10-CM | POA: Diagnosis not present

## 2020-04-07 DIAGNOSIS — M25662 Stiffness of left knee, not elsewhere classified: Secondary | ICD-10-CM

## 2020-04-07 NOTE — Therapy (Signed)
[place], [place], [address] Phone: [phone]   Fax:  [phone]  Physical Therapy Treatment  Patient Details  Name: Terri Shah MRN: [id number] Date of Birth: 1934-09-08 Referring Provider (PT): Alvan Dame   Encounter Date: 04/07/2020  PT End of Session - 04/07/20 1400    Visit Number  4    Date for PT Re-Evaluation  05/31/20    Authorization Type  [employer] Medicare    PT Start Time  1320    PT Stop Time  1415    PT Time Calculation (min)  55 min    Activity Tolerance  Patient limited by pain    Behavior During Therapy  [facility] for tasks assessed/performed       Past Medical History:  Diagnosis Date  . Allergy   . Anemia   . Arthritis   . Barrett esophagus   . Barrett's esophagus 11/02/2007   Qualifier: Diagnosis of  By: Mat Carne    . Blood transfusion without reported diagnosis    late 20's or early 30's  . Cataract   . Diverticulosis   . ECTOPIC PREGNANCY 02/08/2008   Qualifier: Diagnosis of  By: Mat Carne    . Family history of adverse reaction to anesthesia    older sister dont remember what it was   . Fibromyalgia    takes elavil  . Gastritis   . GASTRITIS 11/02/2007   Qualifier: Diagnosis of  By: Mat Carne    . GERD (gastroesophageal reflux disease)   . Glaucoma    right eye  . Heart murmur   . Hiatal hernia   . History of GI diverticular bleed   . Hyperlipidemia   . Hypertension   . Iron deficiency 2020  . Iron deficiency anemia   . Osteoporosis   . Palpitations 04/02/2010   Qualifier: Diagnosis of  By: Angelena Form, MD, Harrell Gave    . PONV (postoperative nausea and vomiting)    after c section  . Presbyesophagus   . Stroke [facility])    "small stroke" 2010- no residual per pt    Past Surgical History:  Procedure Laterality Date  . CATARACT EXTRACTION, BILATERAL    . CESAREAN SECTION  1950-1960s   x 3  . CHOLECYSTECTOMY    . COLONOSCOPY N/A  12/19/2015   Procedure: COLONOSCOPY;  Surgeon: Manus Gunning, MD;  Location: [facility];  Service: Gastroenterology;  Laterality: N/A;  . COLONOSCOPY    . [place]  . FACIAL COSMETIC SURGERY    . LAPAROSCOPIC CHOLECYSTECTOMY SINGLE SITE WITH INTRAOPERATIVE CHOLANGIOGRAM N/A 10/27/2014   Procedure: LAPAROSCOPIC CHOLECYSTECTOMY SINGLE SITE WITH INTRAOPERATIVE CHOLANGIOGRAM;  Surgeon: Najma Bozarth Boston, MD;  [facility];  Service: General;  Laterality: N/A;  . TOTAL KNEE ARTHROPLASTY Left 03/11/2020   Procedure: TOTAL KNEE ARTHROPLASTY;  Surgeon: Paralee Cancel, MD;  [facility];  Service: Orthopedics;  Laterality: Left;  70 mins  . TUBAL LIGATION    . UPPER GASTROINTESTINAL ENDOSCOPY      There were no vitals filed for this visit.  Subjective Assessment - 04/07/20 1326    Subjective  Patient c/o left hip pain and swelling, reports that when the hip hurts there is not much she can do, reports that she struggles to do exercises at home and to elevate.    Currently in Pain?  Yes    Pain Score  4  Pain Location  Hip    Pain Orientation  Left    Aggravating Factors   walking                       [facility] Adult PT Treatment/Exercise - 04/07/20 0001      Ambulation/Gait   Gait Comments  gait with FWW, needing cues to bend the knee, she is very stiff today, not bending the knee      Lumbar Exercises: Aerobic   Nustep  level 2 x 6 min      Knee/Hip Exercises: Standing   Other Standing Knee Exercises  6 " toe clears cues verbal and tactile to decrease the circumduction      Knee/Hip Exercises: Seated   Long Arc Quad  2 sets;10 reps    Heel Slides  1 set;15 reps    Heel Slides Limitations  5 reps LLLD knee flexion    Hamstring Curl  Left;2 sets;10 reps    Hamstring Limitations  used yellow tband      Vasopneumatic   Number Minutes Vasopneumatic   12 minutes    Vasopnuematic Location   Knee    Vasopneumatic Pressure  Medium     Vasopneumatic Temperature   36      Manual Therapy   Manual Therapy  Joint mobilization;Soft tissue mobilization;Passive ROM    Joint Mobilization  joint distraction with knee extension stretch seated    Soft tissue mobilization  mobilization of scar tissue    Passive ROM  seated passive knee flexion stretch               PT Short Term Goals - 03/31/20 1357      PT SHORT TERM GOAL #1   Title  Ind with intial HEP    Time  2    Period  Weeks    Status  New        PT Long Term Goals - 03/31/20 1357      PT LONG TERM GOAL #1   Title  independent with RICE    Time  8    Period  Weeks    Status  New      PT LONG TERM GOAL #2   Title  decrease pain 50%    Time  8    Period  Weeks    Status  New      PT LONG TERM GOAL #3   Title  increase left knee aROM to 5-115 degrees flexion    Time  8    Period  Weeks    Status  New      PT LONG TERM GOAL #4   Title  walk with SPC or without device for all household mobility    Time  8    Period  Weeks    Status  New      PT LONG TERM GOAL #5   Title  go up and down stairs step over step    Time  12    Period  Weeks    Status  New            Plan - 04/07/20 1400    Clinical Impression Statement  Patient is very swollen and is very resistant to bending the knee, she is stiff legged with walking and has a hard time correcting with verbal and tactile cues, I asked and educated about the elevation and RICE at home, also that she needs to tolerate the  stretches    PT Next Visit Plan  work on flexibility and better gait pattern    Consulted and Agree with Plan of Care  Patient       Patient will benefit from skilled therapeutic intervention in order to improve the following deficits and impairments:  Pain, Decreased strength, Decreased range of motion, Abnormal gait, Improper body mechanics, Increased muscle spasms, Decreased mobility, Cardiopulmonary status limiting activity, Postural dysfunction, Difficulty walking,  Impaired flexibility, Decreased activity tolerance, Decreased balance, Increased edema  Visit Diagnosis: Acute pain of left knee  Stiffness of left knee, not elsewhere classified  Difficulty in walking, not elsewhere classified  Localized edema     Problem List Patient Active Problem List   Diagnosis Date Noted  . Status post left knee replacement 03/14/2020  . S/P left TKA 03/11/2020  . Preoperative clearance 02/22/2019  . Colon polyp   . Lower GI bleed 12/18/2015  . Diverticulosis of colon with hemorrhage   . Nausea with vomiting 12/02/2014  . Ileus, postoperative ([place]) 11/04/2014  . Hyponatremia 11/04/2014  . Abdominal fluid collection   . Chest pain 10/26/2014  . Acute calculous cholecystitis 10/26/2014  . Barrett esophagus   . History of GI diverticular bleed   . Essential hypertension   . Dyspnea 01/18/2012  . Aortic insufficiency 01/18/2012  . ANEMIA, IRON DEFICIENCY 06/04/2010  . HYPERCHOLESTEROLEMIA 02/08/2008  . GERD 02/08/2008  . ARTHRITIS 02/08/2008  . HIATAL HERNIA 11/02/2007  . DIVERTICULOSIS OF COLON 04/07/2004    Sumner Boast., PT 04/07/2020, 3:33 PM  [place] [address] [place], [place], [address] Phone: [phone]   Fax:  [phone]  Name: Terri Shah MRN: [id number] Date of Birth: Nov 30, 1934

## 2020-04-09 ENCOUNTER — Ambulatory Visit: Payer: Medicare HMO | Admitting: Physical Therapy

## 2020-04-09 ENCOUNTER — Other Ambulatory Visit: Payer: Self-pay

## 2020-04-09 ENCOUNTER — Encounter: Payer: Self-pay | Admitting: Physical Therapy

## 2020-04-09 DIAGNOSIS — R600 Localized edema: Secondary | ICD-10-CM

## 2020-04-09 DIAGNOSIS — M25662 Stiffness of left knee, not elsewhere classified: Secondary | ICD-10-CM | POA: Diagnosis not present

## 2020-04-09 DIAGNOSIS — R262 Difficulty in walking, not elsewhere classified: Secondary | ICD-10-CM

## 2020-04-09 DIAGNOSIS — M5442 Lumbago with sciatica, left side: Secondary | ICD-10-CM | POA: Diagnosis not present

## 2020-04-09 DIAGNOSIS — M25562 Pain in left knee: Secondary | ICD-10-CM | POA: Diagnosis not present

## 2020-04-09 DIAGNOSIS — G8929 Other chronic pain: Secondary | ICD-10-CM | POA: Diagnosis not present

## 2020-04-09 NOTE — Therapy (Signed)
[address] [place], [place], [address] Phone: [phone]   Fax:  [phone]  Physical Therapy Treatment  Patient Details  Name: Terri Shah MRN: [id number] Date of Birth: 10-May-1934 Referring Provider (PT): Alvan Dame   Encounter Date: 04/09/2020  PT End of Session - 04/09/20 1150    Visit Number  5    Date for PT Re-Evaluation  05/31/20    Authorization Type  [employer] Medicare    PT Start Time  1100    PT Stop Time  1201    PT Time Calculation (min)  61 min    Activity Tolerance  Patient limited by pain    Behavior During Therapy  [facility] for tasks assessed/performed       Past Medical History:  Diagnosis Date  . Allergy   . Anemia   . Arthritis   . Barrett esophagus   . Barrett's esophagus 11/02/2007   Qualifier: Diagnosis of  By: Mat Carne    . Blood transfusion without reported diagnosis    late 20's or early 30's  . Cataract   . Diverticulosis   . ECTOPIC PREGNANCY 02/08/2008   Qualifier: Diagnosis of  By: Mat Carne    . Family history of adverse reaction to anesthesia    older sister dont remember what it was   . Fibromyalgia    takes elavil  . Gastritis   . GASTRITIS 11/02/2007   Qualifier: Diagnosis of  By: Mat Carne    . GERD (gastroesophageal reflux disease)   . Glaucoma    right eye  . Heart murmur   . Hiatal hernia   . History of GI diverticular bleed   . Hyperlipidemia   . Hypertension   . Iron deficiency 2020  . Iron deficiency anemia   . Osteoporosis   . Palpitations 04/02/2010   Qualifier: Diagnosis of  By: Angelena Form, MD, Harrell Gave    . PONV (postoperative nausea and vomiting)    after c section  . Presbyesophagus   . Stroke [facility])    "small stroke" 2010- no residual per pt    Past Surgical History:  Procedure Laterality Date  . CATARACT EXTRACTION, BILATERAL    . CESAREAN SECTION  1950-1960s   x 3  . CHOLECYSTECTOMY    . COLONOSCOPY N/A  12/19/2015   Procedure: COLONOSCOPY;  Surgeon: Manus Gunning, MD;  Location: [facility];  Service: Gastroenterology;  Laterality: N/A;  . COLONOSCOPY    . [place]  . FACIAL COSMETIC SURGERY    . LAPAROSCOPIC CHOLECYSTECTOMY SINGLE SITE WITH INTRAOPERATIVE CHOLANGIOGRAM N/A 10/27/2014   Procedure: LAPAROSCOPIC CHOLECYSTECTOMY SINGLE SITE WITH INTRAOPERATIVE CHOLANGIOGRAM;  Surgeon: Adeleine Pask Boston, MD;  Location: [facility];  Service: General;  Laterality: N/A;  . TOTAL KNEE ARTHROPLASTY Left 03/11/2020   Procedure: TOTAL KNEE ARTHROPLASTY;  Surgeon: Paralee Cancel, MD;  Location: [facility];  Service: Orthopedics;  Laterality: Left;  70 mins  . TUBAL LIGATION    . UPPER GASTROINTESTINAL ENDOSCOPY      There were no vitals filed for this visit.  Subjective Assessment - 04/09/20 1107    Subjective  Patient reports that she is frustrated with the swelling and the pain when she bends    Currently in Pain?  Yes    Pain Score  4     Pain Location  Knee    Pain Orientation  Left    Aggravating Factors  bending         [facility] PT Assessment - 04/09/20 0001      AROM   Left Knee Flexion  76      PROM   Left Knee Flexion  85                   [facility] Adult PT Treatment/Exercise - 04/09/20 0001      Ambulation/Gait   Gait Comments  gait with SPC x 130 feet      Knee/Hip Exercises: Aerobic   Nustep  level 3 x 6 minutes      Knee/Hip Exercises: Standing   Other Standing Knee Exercises  6 " toe clears cues verbal and tactile to decrease the circumduction      Knee/Hip Exercises: Seated   Long Arc Quad  2 sets;10 reps    Long Arc Quad Weight  2 lbs.    Hamstring Curl  Left;2 sets;10 reps    Hamstring Limitations  red tband      Knee/Hip Exercises: Supine   Short Arc Quad Sets  Left;2 sets;10 reps      Vasopneumatic   Number Minutes Vasopneumatic   10 minutes    Vasopnuematic Location   Knee    Vasopneumatic Pressure  Medium     Vasopneumatic Temperature   36      Manual Therapy   Manual Therapy  Joint mobilization;Soft tissue mobilization;Passive ROM    Joint Mobilization  joint distraction with knee extension stretch seated    Soft tissue mobilization  mobilization of scar tissue    Passive ROM  seated passive knee flexion stretch               PT Short Term Goals - 03/31/20 1357      PT SHORT TERM GOAL #1   Title  Ind with intial HEP    Time  2    Period  Weeks    Status  New        PT Long Term Goals - 04/09/20 1152      PT LONG TERM GOAL #1   Title  independent with RICE    Status  Partially Met      PT LONG TERM GOAL #2   Title  decrease pain 50%    Status  On-going      PT LONG TERM GOAL #3   Title  increase left knee aROM to 5-115 degrees flexion    Status  On-going            Plan - 04/09/20 1150    Clinical Impression Statement  Patient with increased ROM for flexion passively, she does tend to gaurd, needs cues to relax, she is unsteady and feels unsure with the [facility].  The swelling is significant, pain i s the limiting factor with the PROM    PT Next Visit Plan  work on flexibility and better gait pattern    Consulted and Agree with Plan of Care  Patient       Patient will benefit from skilled therapeutic intervention in order to improve the following deficits and impairments:  Pain, Decreased strength, Decreased range of motion, Abnormal gait, Improper body mechanics, Increased muscle spasms, Decreased mobility, Cardiopulmonary status limiting activity, Postural dysfunction, Difficulty walking, Impaired flexibility, Decreased activity tolerance, Decreased balance, Increased edema  Visit Diagnosis: Acute pain of left knee  Stiffness of left knee, not elsewhere classified  Difficulty in walking, not elsewhere classified  Localized edema  Acute bilateral  low back pain with left-sided sciatica     Problem List Patient Active Problem List   Diagnosis Date Noted   . Status post left knee replacement 03/14/2020  . S/P left TKA 03/11/2020  . Preoperative clearance 02/22/2019  . Colon polyp   . Lower GI bleed 12/18/2015  . Diverticulosis of colon with hemorrhage   . Nausea with vomiting 12/02/2014  . Ileus, postoperative ([place]) 11/04/2014  . Hyponatremia 11/04/2014  . Abdominal fluid collection   . Chest pain 10/26/2014  . Acute calculous cholecystitis 10/26/2014  . Barrett esophagus   . History of GI diverticular bleed   . Essential hypertension   . Dyspnea 01/18/2012  . Aortic insufficiency 01/18/2012  . ANEMIA, IRON DEFICIENCY 06/04/2010  . HYPERCHOLESTEROLEMIA 02/08/2008  . GERD 02/08/2008  . ARTHRITIS 02/08/2008  . HIATAL HERNIA 11/02/2007  . DIVERTICULOSIS OF COLON 04/07/2004    Sumner Boast., PT 04/09/2020, 11:53 AM  [address] [place], [place], [address] Phone: [phone]   Fax:  [phone]  Name: Terri Shah MRN: [id number] Date of Birth: Feb 02, 1934

## 2020-04-11 ENCOUNTER — Ambulatory Visit: Payer: Medicare HMO | Admitting: Physical Therapy

## 2020-04-11 ENCOUNTER — Other Ambulatory Visit: Payer: Self-pay

## 2020-04-11 DIAGNOSIS — R262 Difficulty in walking, not elsewhere classified: Secondary | ICD-10-CM | POA: Diagnosis not present

## 2020-04-11 DIAGNOSIS — G8929 Other chronic pain: Secondary | ICD-10-CM

## 2020-04-11 DIAGNOSIS — R600 Localized edema: Secondary | ICD-10-CM

## 2020-04-11 DIAGNOSIS — M5442 Lumbago with sciatica, left side: Secondary | ICD-10-CM | POA: Diagnosis not present

## 2020-04-11 DIAGNOSIS — M25562 Pain in left knee: Secondary | ICD-10-CM

## 2020-04-11 DIAGNOSIS — M25662 Stiffness of left knee, not elsewhere classified: Secondary | ICD-10-CM

## 2020-04-11 NOTE — Therapy (Signed)
[place], [place], [address] Phone: [phone]   Fax:  [phone] During this treatment session, the therapist was present, participating in and directing the treatment.  Physical Therapy Treatment  Patient Details  Name: Terri Shah MRN: [id number] Date of Birth: 12-29-1933 Referring Provider (PT): Cathe Mons Date: 04/11/2020  PT End of Session - 04/11/20 1204    Visit Number  6    Date for PT Re-Evaluation  05/31/20    PT Start Time  1100    PT Stop Time  1202    PT Time Calculation (min)  62 min    Activity Tolerance  Patient limited by pain    Behavior During Therapy  [facility] for tasks assessed/performed       Past Medical History:  Diagnosis Date  . Allergy   . Anemia   . Arthritis   . Barrett esophagus   . Barrett's esophagus 11/02/2007   Qualifier: Diagnosis of  By: Mat Carne    . Blood transfusion without reported diagnosis    late 20's or early 30's  . Cataract   . Diverticulosis   . ECTOPIC PREGNANCY 02/08/2008   Qualifier: Diagnosis of  By: Mat Carne    . Family history of adverse reaction to anesthesia    older sister dont remember what it was   . Fibromyalgia    takes elavil  . Gastritis   . GASTRITIS 11/02/2007   Qualifier: Diagnosis of  By: Mat Carne    . GERD (gastroesophageal reflux disease)   . Glaucoma    right eye  . Heart murmur   . Hiatal hernia   . History of GI diverticular bleed   . Hyperlipidemia   . Hypertension   . Iron deficiency 2020  . Iron deficiency anemia   . Osteoporosis   . Palpitations 04/02/2010   Qualifier: Diagnosis of  By: Angelena Form, MD, Harrell Gave    . PONV (postoperative nausea and vomiting)    after c section  . Presbyesophagus   . Stroke [facility])    "small stroke" 2010- no residual per pt    Past Surgical History:  Procedure Laterality Date  . CATARACT EXTRACTION, BILATERAL    . CESAREAN SECTION   1950-1960s   x 3  . CHOLECYSTECTOMY    . COLONOSCOPY N/A 12/19/2015   Procedure: COLONOSCOPY;  Surgeon: Manus Gunning, MD;  Location: [facility];  Service: Gastroenterology;  Laterality: N/A;  . COLONOSCOPY    . [place]  . FACIAL COSMETIC SURGERY    . LAPAROSCOPIC CHOLECYSTECTOMY SINGLE SITE WITH INTRAOPERATIVE CHOLANGIOGRAM N/A 10/27/2014   Procedure: LAPAROSCOPIC CHOLECYSTECTOMY SINGLE SITE WITH INTRAOPERATIVE CHOLANGIOGRAM;  Surgeon: Michael Boston, MD;  [facility];  Service: General;  Laterality: N/A;  . TOTAL KNEE ARTHROPLASTY Left 03/11/2020   Procedure: TOTAL KNEE ARTHROPLASTY;  Surgeon: Paralee Cancel, MD;  [facility];  Service: Orthopedics;  Laterality: Left;  70 mins  . TUBAL LIGATION    . UPPER GASTROINTESTINAL ENDOSCOPY      There were no vitals filed for this visit.  Subjective Assessment - 04/11/20 1159    Subjective  Pt stated she doesn't have pain only when she starts moving, pain increased to a 7/10 during PT    Limitations  Standing;Walking;House hold activities    Patient Stated Goals  have good motion, less pain , walk better.    Currently  in Pain?  Yes    Pain Score  2     Pain Location  Knee    Pain Orientation  Left         [facility] PT Assessment - 04/11/20 0001      AROM   Left Knee Extension  9    Left Knee Flexion  81      PROM   Left Knee Extension  5    Left Knee Flexion  85                   [facility] Adult PT Treatment/Exercise - 04/11/20 0001      Lumbar Exercises: Aerobic   Nustep  L2x6 min      Lumbar Exercises: Seated   Long Arc Quad on Chair  Strengthening;Left;2 sets;10 reps;Weights   2 #     Knee/Hip Exercises: Aerobic   Nustep  L2, 6 min      Knee/Hip Exercises: Standing   Other Standing Knee Exercises  ham curls, 2x10    Other Standing Knee Exercises  sit to stand, 2x10   cues to even out weight     Knee/Hip Exercises: Seated   Long Arc Quad  Strengthening;Left;2  sets;10 reps;Weights   2#   Hamstring Curl  Left;2 sets;10 reps    Hamstring Limitations  red tband      Knee/Hip Exercises: Supine   Short Arc Quad Sets  Left;2 sets;10 reps   2#     Manual Therapy   Manual Therapy  Passive ROM    Soft tissue mobilization  mobilization of scar tissue    Passive ROM  knee flex and ext               PT Short Term Goals - 04/11/20 1157      PT SHORT TERM GOAL #1   Title  Ind with intial HEP    Time  2    Period  Weeks    Status  Partially Met        PT Long Term Goals - 04/11/20 1158      PT LONG TERM GOAL #1   Title  independent with RICE    Time  8    Period  Weeks    Status  Partially Met      PT LONG TERM GOAL #2   Title  decrease pain 50%    Time  8    Period  Weeks    Status  On-going      PT LONG TERM GOAL #3   Title  increase left knee aROM to 5-115 degrees flexion    Time  8    Period  Weeks    Status  On-going      PT LONG TERM GOAL #4   Title  walk with SPC or without device for all household mobility    Time  8    Period  Weeks    Status  On-going      PT LONG TERM GOAL #5   Title  go up and down stairs step over step    Time  12    Period  Weeks    Status  On-going            Plan - 04/11/20 1155    Clinical Impression Statement  Pt knee flex ROM is increasing, cues needed to even weight on sit to stands, still a lot of swelling and warmth in the L knee, circumduction  with gait , cues needed to bend L knee.    Stability/Clinical Decision Making  Evolving/Moderate complexity    PT Treatment/Interventions  ADLs/Self Care Home Management;Cryotherapy;Electrical Stimulation;Moist Heat;Ultrasound;Therapeutic activities;Therapeutic exercise;Neuromuscular re-education;Manual techniques;Patient/family education;Vasopneumatic Device;Functional mobility training;Stair training;Gait training    PT Next Visit Plan  work on flexibility and better gait pattern       Patient will benefit from skilled  therapeutic intervention in order to improve the following deficits and impairments:  Pain, Decreased strength, Decreased range of motion, Abnormal gait, Improper body mechanics, Increased muscle spasms, Decreased mobility, Cardiopulmonary status limiting activity, Postural dysfunction, Difficulty walking, Impaired flexibility, Decreased activity tolerance, Decreased balance, Increased edema  Visit Diagnosis: Acute pain of left knee  Stiffness of left knee, not elsewhere classified  Difficulty in walking, not elsewhere classified  Localized edema  Acute bilateral low back pain with left-sided sciatica  Chronic pain of left knee     Problem List Patient Active Problem List   Diagnosis Date Noted  . Status post left knee replacement 03/14/2020  . S/P left TKA 03/11/2020  . Preoperative clearance 02/22/2019  . Colon polyp   . Lower GI bleed 12/18/2015  . Diverticulosis of colon with hemorrhage   . Nausea with vomiting 12/02/2014  . Ileus, postoperative ([place]) 11/04/2014  . Hyponatremia 11/04/2014  . Abdominal fluid collection   . Chest pain 10/26/2014  . Acute calculous cholecystitis 10/26/2014  . Barrett esophagus   . History of GI diverticular bleed   . Essential hypertension   . Dyspnea 01/18/2012  . Aortic insufficiency 01/18/2012  . ANEMIA, IRON DEFICIENCY 06/04/2010  . HYPERCHOLESTEROLEMIA 02/08/2008  . GERD 02/08/2008  . ARTHRITIS 02/08/2008  . HIATAL HERNIA 11/02/2007  . DIVERTICULOSIS OF COLON 04/07/2004    Clarene Essex 04/11/2020, 12:06 PM  [address] [place], [place], [address] Phone: [phone]   Fax:  [phone]  Name: AMELDA HAPKE MRN: [id number] Date of Birth: 04-30-1934

## 2020-04-14 ENCOUNTER — Other Ambulatory Visit: Payer: Self-pay

## 2020-04-14 ENCOUNTER — Encounter: Payer: Self-pay | Admitting: Physical Therapy

## 2020-04-14 ENCOUNTER — Ambulatory Visit: Payer: Medicare HMO | Admitting: Physical Therapy

## 2020-04-14 DIAGNOSIS — M25662 Stiffness of left knee, not elsewhere classified: Secondary | ICD-10-CM

## 2020-04-14 DIAGNOSIS — R600 Localized edema: Secondary | ICD-10-CM

## 2020-04-14 DIAGNOSIS — M5442 Lumbago with sciatica, left side: Secondary | ICD-10-CM

## 2020-04-14 DIAGNOSIS — M25562 Pain in left knee: Secondary | ICD-10-CM

## 2020-04-14 DIAGNOSIS — G8929 Other chronic pain: Secondary | ICD-10-CM

## 2020-04-14 DIAGNOSIS — R262 Difficulty in walking, not elsewhere classified: Secondary | ICD-10-CM | POA: Diagnosis not present

## 2020-04-14 NOTE — Therapy (Addendum)
[place], [place], [address] Phone: [phone]   Fax:  [phone]  Physical Therapy Treatment  Patient Details  Name: Terri Shah MRN: [id number] Date of Birth: May 23, 1934 Referring Provider (PT): Cathe Mons Date: 04/14/2020  PT End of Session - 04/14/20 1532    Visit Number  7    Date for PT Re-Evaluation  05/31/20    Authorization Type  [employer] Medicare    PT Start Time  1400    PT Stop Time  1445    PT Time Calculation (min)  45 min    Activity Tolerance  Patient limited by pain    Behavior During Therapy  [facility] for tasks assessed/performed       Past Medical History:  Diagnosis Date  . Allergy   . Anemia   . Arthritis   . Barrett esophagus   . Barrett's esophagus 11/02/2007   Qualifier: Diagnosis of  By: Mat Carne    . Blood transfusion without reported diagnosis    late 20's or early 30's  . Cataract   . Diverticulosis   . ECTOPIC PREGNANCY 02/08/2008   Qualifier: Diagnosis of  By: Mat Carne    . Family history of adverse reaction to anesthesia    older sister dont remember what it was   . Fibromyalgia    takes elavil  . Gastritis   . GASTRITIS 11/02/2007   Qualifier: Diagnosis of  By: Mat Carne    . GERD (gastroesophageal reflux disease)   . Glaucoma    right eye  . Heart murmur   . Hiatal hernia   . History of GI diverticular bleed   . Hyperlipidemia   . Hypertension   . Iron deficiency 2020  . Iron deficiency anemia   . Osteoporosis   . Palpitations 04/02/2010   Qualifier: Diagnosis of  By: Angelena Form, MD, Harrell Gave    . PONV (postoperative nausea and vomiting)    after c section  . Presbyesophagus   . Stroke [facility])    "small stroke" 2010- no residual per pt    Past Surgical History:  Procedure Laterality Date  . CATARACT EXTRACTION, BILATERAL    . CESAREAN SECTION  1950-1960s   x 3  . CHOLECYSTECTOMY    . COLONOSCOPY N/A  12/19/2015   Procedure: COLONOSCOPY;  Surgeon: Manus Gunning, MD;  Location: [facility];  Service: Gastroenterology;  Laterality: N/A;  . COLONOSCOPY    . [place]  . FACIAL COSMETIC SURGERY    . LAPAROSCOPIC CHOLECYSTECTOMY SINGLE SITE WITH INTRAOPERATIVE CHOLANGIOGRAM N/A 10/27/2014   Procedure: LAPAROSCOPIC CHOLECYSTECTOMY SINGLE SITE WITH INTRAOPERATIVE CHOLANGIOGRAM;  Surgeon: Michael Boston, MD;  [facility];  Service: General;  Laterality: N/A;  . TOTAL KNEE ARTHROPLASTY Left 03/11/2020   Procedure: TOTAL KNEE ARTHROPLASTY;  Surgeon: Paralee Cancel, MD;  [facility];  Service: Orthopedics;  Laterality: Left;  70 mins  . TUBAL LIGATION    . UPPER GASTROINTESTINAL ENDOSCOPY      There were no vitals filed for this visit.  Subjective Assessment - 04/14/20 1410    Subjective  Pt reports 4/10 L knee pain at the beginning of today's session; pt states she used the cane to walk around the house this weekend with no LOB. Pt reports she is feeling more stable overall but that her back is bothering her.    Pertinent History  arthritis, OP,  HTN,  "small stroke"    Limitations  Standing;Walking;House hold activities    Patient Stated Goals  have good motion, less pain , walk better.    Currently in Pain?  Yes    Pain Score  4     Pain Location  Knee    Pain Orientation  Left    Pain Descriptors / Indicators  Aching;Sore    Pain Type  Surgical pain    Pain Onset  1 to 4 weeks ago    Pain Frequency  Intermittent    Aggravating Factors   bending the knee    Pain Relieving Factors  ice, pain meds, rest    Effect of Pain on Daily Activities  limits ADLs, limits stairs, difficulty walking         [facility] PT Assessment - 04/14/20 0001      Circumferential Edema   Circumferential - Right  36 cm mid patella    Circumferential - Left   41.5 cm mid patella      AROM   Left Knee Extension  8    Left Knee Flexion  83      PROM   Left Knee Extension   6    Left Knee Flexion  85                   [facility] Adult PT Treatment/Exercise - 04/14/20 0001      Lumbar Exercises: Aerobic   Nustep  L4 x 8 min      Knee/Hip Exercises: Standing   Terminal Knee Extension  AROM;Left;1 set;10 reps   inferior force by PT to encourage end range extension     Knee/Hip Exercises: Seated   Heel Slides  1 set;10 reps    Heel Slides Limitations  20 sec hold 5 reps with seated knee flexion             PT Education - 04/14/20 1531    Education Details  Reviewed elevation of LE to reduce edema when icing at home    Person(s) Educated  Patient    Methods  Explanation;Demonstration    Comprehension  Verbalized understanding       PT Short Term Goals - 04/11/20 1157      PT SHORT TERM GOAL #1   Title  Ind with intial HEP    Time  2    Period  Weeks    Status  Partially Met        PT Long Term Goals - 04/11/20 1158      PT LONG TERM GOAL #1   Title  independent with RICE    Time  8    Period  Weeks    Status  Partially Met      PT LONG TERM GOAL #2   Title  decrease pain 50%    Time  8    Period  Weeks    Status  On-going      PT LONG TERM GOAL #3   Title  increase left knee aROM to 5-115 degrees flexion    Time  8    Period  Weeks    Status  On-going      PT LONG TERM GOAL #4   Title  walk with SPC or without device for all household mobility    Time  8    Period  Weeks    Status  On-going      PT LONG TERM GOAL #5   Title  go up and down stairs step over step    Time  12    Period  Weeks    Status  On-going            Plan - 04/14/20 1533    Clinical Impression Statement  Pt continues to be limited by pain, swelling, and knee flexion ROM. Pt stated she has not been elevating her L LE above heart when icing d/t increases in LBP. Pt educated on importance of elevating LE when icing in order to reduce edema and increase ROM. Continue to progress LE strength and flexibility.    PT  Treatment/Interventions  ADLs/Self Care Home Management;Cryotherapy;Electrical Stimulation;Moist Heat;Ultrasound;Therapeutic activities;Therapeutic exercise;Neuromuscular re-education;Manual techniques;Patient/family education;Vasopneumatic Device;Functional mobility training;Stair training;Gait training    PT Next Visit Plan  increased knee ROM, reducing edema, gait with SPC. Manual and ice as indicated.    Consulted and Agree with Plan of Care  Patient       Patient will benefit from skilled therapeutic intervention in order to improve the following deficits and impairments:  Pain, Decreased strength, Decreased range of motion, Abnormal gait, Improper body mechanics, Increased muscle spasms, Decreased mobility, Cardiopulmonary status limiting activity, Postural dysfunction, Difficulty walking, Impaired flexibility, Decreased activity tolerance, Decreased balance, Increased edema  Visit Diagnosis: Acute pain of left knee  Stiffness of left knee, not elsewhere classified  Difficulty in walking, not elsewhere classified  Localized edema  Acute bilateral low back pain with left-sided sciatica  Chronic pain of left knee     Problem List Patient Active Problem List   Diagnosis Date Noted  . Status post left knee replacement 03/14/2020  . S/P left TKA 03/11/2020  . Preoperative clearance 02/22/2019  . Colon polyp   . Lower GI bleed 12/18/2015  . Diverticulosis of colon with hemorrhage   . Nausea with vomiting 12/02/2014  . Ileus, postoperative ([place]) 11/04/2014  . Hyponatremia 11/04/2014  . Abdominal fluid collection   . Chest pain 10/26/2014  . Acute calculous cholecystitis 10/26/2014  . Barrett esophagus   . History of GI diverticular bleed   . Essential hypertension   . Dyspnea 01/18/2012  . Aortic insufficiency 01/18/2012  . ANEMIA, IRON DEFICIENCY 06/04/2010  . HYPERCHOLESTEROLEMIA 02/08/2008  . GERD 02/08/2008  . ARTHRITIS 02/08/2008  . HIATAL HERNIA 11/02/2007  .  DIVERTICULOSIS OF COLON 04/07/2004   Amador Cunas, PT, DPT Donald Prose Junius Faucett 04/14/2020, 3:37 PM  [address] [place], [place], [address] Phone: [phone]   Fax:  [phone]  Name: Terri Shah MRN: [id number] Date of Birth: 1934-10-15

## 2020-04-15 ENCOUNTER — Telehealth: Payer: Self-pay | Admitting: Gastroenterology

## 2020-04-15 NOTE — Telephone Encounter (Signed)
Patient had knee surgery and is on narcotics 3 times a day.  She stopped the bowel regimen that the ortho prescribed and now would like to resume.  She is asking that Dr. Havery Moros send her in a refill of the colace.  She is asked to contact the prescriber of the colace for the refill.

## 2020-04-16 ENCOUNTER — Other Ambulatory Visit: Payer: Self-pay

## 2020-04-16 ENCOUNTER — Ambulatory Visit: Payer: Medicare HMO | Admitting: Physical Therapy

## 2020-04-16 ENCOUNTER — Encounter: Payer: Self-pay | Admitting: Physical Therapy

## 2020-04-16 DIAGNOSIS — R600 Localized edema: Secondary | ICD-10-CM

## 2020-04-16 DIAGNOSIS — R262 Difficulty in walking, not elsewhere classified: Secondary | ICD-10-CM | POA: Diagnosis not present

## 2020-04-16 DIAGNOSIS — M25662 Stiffness of left knee, not elsewhere classified: Secondary | ICD-10-CM

## 2020-04-16 DIAGNOSIS — M5442 Lumbago with sciatica, left side: Secondary | ICD-10-CM | POA: Diagnosis not present

## 2020-04-16 DIAGNOSIS — M25562 Pain in left knee: Secondary | ICD-10-CM | POA: Diagnosis not present

## 2020-04-16 DIAGNOSIS — G8929 Other chronic pain: Secondary | ICD-10-CM | POA: Diagnosis not present

## 2020-04-16 NOTE — Therapy (Signed)
[place], [place], [address] Phone: [phone]   Fax:  [phone]  Physical Therapy Treatment  Patient Details  Name: Terri Shah MRN: [id number] Date of Birth: Aug 05, 1934 Referring Provider (PT): Alvan Dame   Encounter Date: 04/16/2020  PT End of Session - 04/16/20 1440    Visit Number  8    Date for PT Re-Evaluation  05/31/20    Authorization Type  [employer] Medicare    PT Start Time  1400    PT Stop Time  1500    PT Time Calculation (min)  60 min    Activity Tolerance  Patient limited by pain    Behavior During Therapy  [facility] for tasks assessed/performed       Past Medical History:  Diagnosis Date  . Allergy   . Anemia   . Arthritis   . Barrett esophagus   . Barrett's esophagus 11/02/2007   Qualifier: Diagnosis of  By: Mat Carne    . Blood transfusion without reported diagnosis    late 20's or early 30's  . Cataract   . Diverticulosis   . ECTOPIC PREGNANCY 02/08/2008   Qualifier: Diagnosis of  By: Mat Carne    . Family history of adverse reaction to anesthesia    older sister dont remember what it was   . Fibromyalgia    takes elavil  . Gastritis   . GASTRITIS 11/02/2007   Qualifier: Diagnosis of  By: Mat Carne    . GERD (gastroesophageal reflux disease)   . Glaucoma    right eye  . Heart murmur   . Hiatal hernia   . History of GI diverticular bleed   . Hyperlipidemia   . Hypertension   . Iron deficiency 2020  . Iron deficiency anemia   . Osteoporosis   . Palpitations 04/02/2010   Qualifier: Diagnosis of  By: Angelena Form, MD, Harrell Gave    . PONV (postoperative nausea and vomiting)    after c section  . Presbyesophagus   . Stroke [facility])    "small stroke" 2010- no residual per pt    Past Surgical History:  Procedure Laterality Date  . CATARACT EXTRACTION, BILATERAL    . CESAREAN SECTION  1950-1960s   x 3  . CHOLECYSTECTOMY    . COLONOSCOPY N/A  12/19/2015   Procedure: COLONOSCOPY;  Surgeon: Manus Gunning, MD;  Location: [facility];  Service: Gastroenterology;  Laterality: N/A;  . COLONOSCOPY    . [place]  . FACIAL COSMETIC SURGERY    . LAPAROSCOPIC CHOLECYSTECTOMY SINGLE SITE WITH INTRAOPERATIVE CHOLANGIOGRAM N/A 10/27/2014   Procedure: LAPAROSCOPIC CHOLECYSTECTOMY SINGLE SITE WITH INTRAOPERATIVE CHOLANGIOGRAM;  Surgeon: Marqus Macphee Boston, MD;  Location: [facility];  Service: General;  Laterality: N/A;  . TOTAL KNEE ARTHROPLASTY Left 03/11/2020   Procedure: TOTAL KNEE ARTHROPLASTY;  Surgeon: Paralee Cancel, MD;  Location: [facility];  Service: Orthopedics;  Laterality: Left;  70 mins  . TUBAL LIGATION    . UPPER GASTROINTESTINAL ENDOSCOPY      There were no vitals filed for this visit.  Subjective Assessment - 04/16/20 1400    Subjective  I am getting discorouged, it hurts and is stiff and swollen    Currently in Pain?  Yes    Pain Score  4     Pain Location  Knee    Pain Orientation  Left         [facility] PT  Assessment - 04/16/20 0001      PROM   Left Knee Flexion  87                   [facility] Adult PT Treatment/Exercise - 04/16/20 0001      Ambulation/Gait   Gait Comments  with SPC verbal and tactile cues to bend leg with swing through      Knee/Hip Exercises: Aerobic   Nustep  L3, 6 min      Knee/Hip Exercises: Machines for Strengthening   Total Gym Leg Press  no weight 2x8, working on flexion, then left only no weight small motion trying to get active quad      Vasopneumatic   Number Minutes Vasopneumatic   10 minutes    Vasopnuematic Location   Knee    Vasopneumatic Pressure  Medium    Vasopneumatic Temperature   34      Manual Therapy   Manual Therapy  Passive ROM;Soft tissue mobilization    Soft tissue mobilization  mobilization of scar tissue    Passive ROM  knee flex and ext, some contract and relax to help gain flexion               PT Short Term Goals -  04/11/20 1157      PT SHORT TERM GOAL #1   Title  Ind with intial HEP    Time  2    Period  Weeks    Status  Partially Met        PT Long Term Goals - 04/16/20 1444      PT LONG TERM GOAL #1   Title  independent with RICE    Status  Partially Met      PT LONG TERM GOAL #2   Title  decrease pain 50%    Status  On-going      PT LONG TERM GOAL #3   Title  increase left knee aROM to 5-115 degrees flexion    Status  On-going            Plan - 04/16/20 1441    Clinical Impression Statement  Patient is very stiff, she is very swollen, reports that she cannot do the exercises due to pain.  She seemed to do better with some light contract relax, I was able to get a little more ROM with this.  Worked some on gait with SPC, needs verbal and tactile cues to bend the knee otherwise will walk stiff legged.  I did feel some pops when I was trying to stretch the scar proximally    PT Next Visit Plan  continue to try to push ROM and function    Consulted and Agree with Plan of Care  Patient       Patient will benefit from skilled therapeutic intervention in order to improve the following deficits and impairments:  Pain, Decreased strength, Decreased range of motion, Abnormal gait, Improper body mechanics, Increased muscle spasms, Decreased mobility, Cardiopulmonary status limiting activity, Postural dysfunction, Difficulty walking, Impaired flexibility, Decreased activity tolerance, Decreased balance, Increased edema  Visit Diagnosis: Acute pain of left knee  Stiffness of left knee, not elsewhere classified  Difficulty in walking, not elsewhere classified  Localized edema     Problem List Patient Active Problem List   Diagnosis Date Noted  . Status post left knee replacement 03/14/2020  . S/P left TKA 03/11/2020  . Preoperative clearance 02/22/2019  . Colon polyp   . Lower GI bleed 12/18/2015  .  Diverticulosis of colon with hemorrhage   . Nausea with vomiting 12/02/2014   . Ileus, postoperative ([place]) 11/04/2014  . Hyponatremia 11/04/2014  . Abdominal fluid collection   . Chest pain 10/26/2014  . Acute calculous cholecystitis 10/26/2014  . Barrett esophagus   . History of GI diverticular bleed   . Essential hypertension   . Dyspnea 01/18/2012  . Aortic insufficiency 01/18/2012  . ANEMIA, IRON DEFICIENCY 06/04/2010  . HYPERCHOLESTEROLEMIA 02/08/2008  . GERD 02/08/2008  . ARTHRITIS 02/08/2008  . HIATAL HERNIA 11/02/2007  . DIVERTICULOSIS OF COLON 04/07/2004    Sumner Boast., PT 04/16/2020, 2:47 PM  [place] [address] [place], [place], [address] Phone: [phone]   Fax:  [phone]  Name: JALEY YAN MRN: [id number] Date of Birth: [birth year]

## 2020-04-18 ENCOUNTER — Encounter: Payer: Self-pay | Admitting: Physical Therapy

## 2020-04-18 ENCOUNTER — Ambulatory Visit: Payer: Medicare HMO | Admitting: Physical Therapy

## 2020-04-18 ENCOUNTER — Other Ambulatory Visit: Payer: Self-pay

## 2020-04-18 DIAGNOSIS — G8929 Other chronic pain: Secondary | ICD-10-CM | POA: Diagnosis not present

## 2020-04-18 DIAGNOSIS — M25662 Stiffness of left knee, not elsewhere classified: Secondary | ICD-10-CM

## 2020-04-18 DIAGNOSIS — M25562 Pain in left knee: Secondary | ICD-10-CM

## 2020-04-18 DIAGNOSIS — R262 Difficulty in walking, not elsewhere classified: Secondary | ICD-10-CM | POA: Diagnosis not present

## 2020-04-18 DIAGNOSIS — M5442 Lumbago with sciatica, left side: Secondary | ICD-10-CM | POA: Diagnosis not present

## 2020-04-18 DIAGNOSIS — R600 Localized edema: Secondary | ICD-10-CM

## 2020-04-18 NOTE — Therapy (Signed)
[place], [place], [address] Phone: [phone]   Fax:  [phone]  Physical Therapy Treatment  Patient Details  Name: Terri Shah MRN: [id number] Date of Birth: 1934-12-15 Referring Provider (PT): Cathe Mons Date: 04/18/2020  PT End of Session - 04/18/20 1148    Visit Number  9    Date for PT Re-Evaluation  05/31/20    Authorization Type  [employer]na Medicare    PT Start Time  1101    PT Stop Time  1156    PT Time Calculation (min)  55 min    Activity Tolerance  Patient limited by pain    Behavior During Therapy  [facility] for tasks assessed/performed       Past Medical History:  Diagnosis Date  . Allergy   . Anemia   . Arthritis   . Barrett esophagus   . Barrett's esophagus 11/02/2007   Qualifier: Diagnosis of  By: Mat Carne    . Blood transfusion without reported diagnosis    late 20's or early 30's  . Cataract   . Diverticulosis   . ECTOPIC PREGNANCY 02/08/2008   Qualifier: Diagnosis of  By: Mat Carne    . Family history of adverse reaction to anesthesia    older sister dont remember what it was   . Fibromyalgia    takes elavil  . Gastritis   . GASTRITIS 11/02/2007   Qualifier: Diagnosis of  By: Mat Carne    . GERD (gastroesophageal reflux disease)   . Glaucoma    right eye  . Heart murmur   . Hiatal hernia   . History of GI diverticular bleed   . Hyperlipidemia   . Hypertension   . Iron deficiency 2020  . Iron deficiency anemia   . Osteoporosis   . Palpitations 04/02/2010   Qualifier: Diagnosis of  By: Angelena Form, MD, Harrell Gave    . PONV (postoperative nausea and vomiting)    after c section  . Presbyesophagus   . Stroke [facility])    "small stroke" 2010- no residual per pt    Past Surgical History:  Procedure Laterality Date  . CATARACT EXTRACTION, BILATERAL    . CESAREAN SECTION  1950-1960s   x 3  . CHOLECYSTECTOMY    . COLONOSCOPY N/A  12/19/2015   Procedure: COLONOSCOPY;  Surgeon: Manus Gunning, MD;  Location: [facility];  Service: Gastroenterology;  Laterality: N/A;  . COLONOSCOPY    . [place]  . FACIAL COSMETIC SURGERY    . LAPAROSCOPIC CHOLECYSTECTOMY SINGLE SITE WITH INTRAOPERATIVE CHOLANGIOGRAM N/A 10/27/2014   Procedure: LAPAROSCOPIC CHOLECYSTECTOMY SINGLE SITE WITH INTRAOPERATIVE CHOLANGIOGRAM;  Surgeon: Rielle Schlauch Boston, MD;  [facility];  Service: General;  Laterality: N/A;  . TOTAL KNEE ARTHROPLASTY Left 03/11/2020   Procedure: TOTAL KNEE ARTHROPLASTY;  Surgeon: Paralee Cancel, MD;  [facility];  Service: Orthopedics;  Laterality: Left;  70 mins  . TUBAL LIGATION    . UPPER GASTROINTESTINAL ENDOSCOPY      There were no vitals filed for this visit.  Subjective Assessment - 04/18/20 1109    Subjective  I felt like we made some progress the last time, I was sore though    Currently in Pain?  Yes    Pain Score  5     Pain Location  Knee    Pain Orientation  Left    Pain Descriptors / Indicators  Tightness;Sore    Aggravating Factors   bending the knee                       [facility] Adult PT Treatment/Exercise - 04/18/20 0001      Ambulation/Gait   Gait Comments  with SPC verbal and tactile cues to bend leg with swing through      Knee/Hip Exercises: Aerobic   Nustep  L3, 6 min      Knee/Hip Exercises: Machines for Strengthening   Cybex Knee Extension  5# 2x8, with 1 minute hold in flexion for stretch between and after the sets    Cybex Knee Flexion  15# 2x10    Total Gym Leg Press  no weight 2x8, working on flexion, then left only no weight small motion trying to get active quad      Knee/Hip Exercises: Standing   Knee Flexion  Left;2 sets;10 reps      Knee/Hip Exercises: Seated   Long Arc Quad  Strengthening;Left;2 sets;10 reps;Weights    Long Arc Quad Weight  2 lbs.      Vasopneumatic   Number Minutes Vasopneumatic   10 minutes     Vasopnuematic Location   Knee    Vasopneumatic Pressure  Medium    Vasopneumatic Temperature   34      Manual Therapy   Manual Therapy  Passive ROM;Soft tissue mobilization    Soft tissue mobilization  mobilization of scar tissue    Passive ROM  knee flex and ext, some contract and relax to help gain flexion               PT Short Term Goals - 04/11/20 1157      PT SHORT TERM GOAL #1   Title  Ind with intial HEP    Time  2    Period  Weeks    Status  Partially Met        PT Long Term Goals - 04/18/20 1151      PT LONG TERM GOAL #1   Title  independent with RICE    Status  Partially Met      PT LONG TERM GOAL #2   Title  decrease pain 50%    Status  Partially Met      PT LONG TERM GOAL #3   Title  increase left knee aROM to 5-115 degrees flexion    Status  On-going            Plan - 04/18/20 1149    Clinical Impression Statement  Patient continues to be very stiff and guarded for flexion, she is tender to touch and resistant to the passive stretch. she seems to do better with some gentle contract relax.  She can walk with the [facility], she reports not feeling safe she does need cues to bend the knee and take longer steps    PT Next Visit Plan  continue to try to push ROM and function    Consulted and Agree with Plan of Care  Patient       Patient will benefit from skilled therapeutic intervention in order to improve the following deficits and impairments:  Pain, Decreased strength, Decreased range of motion, Abnormal gait, Improper body mechanics, Increased muscle spasms, Decreased mobility, Cardiopulmonary status limiting activity, Postural dysfunction, Difficulty walking, Impaired flexibility, Decreased activity tolerance, Decreased balance, Increased edema  Visit Diagnosis: Acute pain of left knee  Stiffness of left knee, not elsewhere classified  Difficulty in  walking, not elsewhere classified  Localized edema     Problem List Patient Active  Problem List   Diagnosis Date Noted  . Status post left knee replacement 03/14/2020  . S/P left TKA 03/11/2020  . Preoperative clearance 02/22/2019  . Colon polyp   . Lower GI bleed 12/18/2015  . Diverticulosis of colon with hemorrhage   . Nausea with vomiting 12/02/2014  . Ileus, postoperative ([place]) 11/04/2014  . Hyponatremia 11/04/2014  . Abdominal fluid collection   . Chest pain 10/26/2014  . Acute calculous cholecystitis 10/26/2014  . Barrett esophagus   . History of GI diverticular bleed   . Essential hypertension   . Dyspnea 01/18/2012  . Aortic insufficiency 01/18/2012  . ANEMIA, IRON DEFICIENCY 06/04/2010  . HYPERCHOLESTEROLEMIA 02/08/2008  . GERD 02/08/2008  . ARTHRITIS 02/08/2008  . HIATAL HERNIA 11/02/2007  . DIVERTICULOSIS OF COLON 04/07/2004    Sumner Boast., PT 04/18/2020, 11:52 AM  [place] [address] [place], [place], [address] Phone: [phone]   Fax:  [phone]  Name: DALAYZA ZAMBRANA MRN: [id number] Date of Birth: [birth year]

## 2020-04-21 ENCOUNTER — Encounter: Payer: Self-pay | Admitting: Physical Therapy

## 2020-04-21 ENCOUNTER — Ambulatory Visit: Payer: Medicare HMO | Attending: Physical Medicine and Rehabilitation | Admitting: Physical Therapy

## 2020-04-21 ENCOUNTER — Other Ambulatory Visit: Payer: Self-pay

## 2020-04-21 DIAGNOSIS — R262 Difficulty in walking, not elsewhere classified: Secondary | ICD-10-CM | POA: Insufficient documentation

## 2020-04-21 DIAGNOSIS — M25662 Stiffness of left knee, not elsewhere classified: Secondary | ICD-10-CM

## 2020-04-21 DIAGNOSIS — G8929 Other chronic pain: Secondary | ICD-10-CM | POA: Diagnosis not present

## 2020-04-21 DIAGNOSIS — R600 Localized edema: Secondary | ICD-10-CM | POA: Diagnosis not present

## 2020-04-21 DIAGNOSIS — M25562 Pain in left knee: Secondary | ICD-10-CM | POA: Insufficient documentation

## 2020-04-21 DIAGNOSIS — M5442 Lumbago with sciatica, left side: Secondary | ICD-10-CM | POA: Diagnosis not present

## 2020-04-21 NOTE — Therapy (Signed)
[place], [place], [address] Phone: [phone]   Fax:  [phone]  Physical Therapy Treatment Progress Note Reporting Period 03/31/2020 to 04/21/2020  See note below for Objective Data and Assessment of Progress/Goals.      Patient Details  Name: Terri Shah MRN: [id number] Date of Birth: 04-16-1934 Referring Provider (PT): Cathe Mons Date: 04/21/2020  PT End of Session - 04/21/20 1354    Visit Number  10    Date for PT Re-Evaluation  05/31/20    Authorization Type  [employer]na Medicare    PT Start Time  1315    PT Stop Time  1405    PT Time Calculation (min)  50 min    Activity Tolerance  Patient limited by pain    Behavior During Therapy  [facility] for tasks assessed/performed       Past Medical History:  Diagnosis Date  . Allergy   . Anemia   . Arthritis   . Barrett esophagus   . Barrett's esophagus 11/02/2007   Qualifier: Diagnosis of  By: Mat Carne    . Blood transfusion without reported diagnosis    late 20's or early 30's  . Cataract   . Diverticulosis   . ECTOPIC PREGNANCY 02/08/2008   Qualifier: Diagnosis of  By: Mat Carne    . Family history of adverse reaction to anesthesia    older sister dont remember what it was   . Fibromyalgia    takes elavil  . Gastritis   . GASTRITIS 11/02/2007   Qualifier: Diagnosis of  By: Mat Carne    . GERD (gastroesophageal reflux disease)   . Glaucoma    right eye  . Heart murmur   . Hiatal hernia   . History of GI diverticular bleed   . Hyperlipidemia   . Hypertension   . Iron deficiency 2020  . Iron deficiency anemia   . Osteoporosis   . Palpitations 04/02/2010   Qualifier: Diagnosis of  By: Angelena Form, MD, Harrell Gave    . PONV (postoperative nausea and vomiting)    after c section  . Presbyesophagus   . Stroke [facility])    "small stroke" 2010- no residual per pt    Past Surgical History:  Procedure Laterality  Date  . CATARACT EXTRACTION, BILATERAL    . CESAREAN SECTION  1950-1960s   x 3  . CHOLECYSTECTOMY    . COLONOSCOPY N/A 12/19/2015   Procedure: COLONOSCOPY;  Surgeon: Manus Gunning, MD;  Location: [facility];  Service: Gastroenterology;  Laterality: N/A;  . COLONOSCOPY    . [place]  . FACIAL COSMETIC SURGERY    . LAPAROSCOPIC CHOLECYSTECTOMY SINGLE SITE WITH INTRAOPERATIVE CHOLANGIOGRAM N/A 10/27/2014   Procedure: LAPAROSCOPIC CHOLECYSTECTOMY SINGLE SITE WITH INTRAOPERATIVE CHOLANGIOGRAM;  Surgeon: Michael Boston, MD;  [facility];  Service: General;  Laterality: N/A;  . TOTAL KNEE ARTHROPLASTY Left 03/11/2020   Procedure: TOTAL KNEE ARTHROPLASTY;  Surgeon: Paralee Cancel, MD;  [facility];  Service: Orthopedics;  Laterality: Left;  70 mins  . TUBAL LIGATION    . UPPER GASTROINTESTINAL ENDOSCOPY      There were no vitals filed for this visit.  Subjective Assessment - 04/21/20 1316    Subjective  Pt states that she was not very active over the weekend due to pain in her low back. Pt states that knee pain is pretty mild today.  Pertinent History  arthritis, OP, HTN,  "small stroke"    Limitations  Standing;Walking;House hold activities    Patient Stated Goals  have good motion, less pain , walk better.    Currently in Pain?  Yes    Pain Score  2     Pain Location  Knee    Pain Orientation  Left    Pain Descriptors / Indicators  Sore;Tightness    Pain Type  Surgical pain    Pain Radiating Towards  a little pain in the calf    Pain Onset  More than a month ago    Pain Frequency  Intermittent    Aggravating Factors   bending knee    Pain Relieving Factors  ice, pain meds, rest         [facility] PT Assessment - 04/21/20 0001      Assessment   Onset Date/Surgical Date  05/01/20      Circumferential Edema   Circumferential - Right  36 cm mid patella    Circumferential - Left   42 cm mid patella      AROM   Left Knee Extension  7     Left Knee Flexion  90                   [facility] Adult PT Treatment/Exercise - 04/21/20 0001      Knee/Hip Exercises: Machines for Strengthening   Cybex Knee Extension  10# 2x10 BLE with 1 min hold in knee flexion in between and after sets    Cybex Knee Flexion  15# 2x10    Total Gym Leg Press  no weight 2x8, working on flexion, then left only no weight small motion trying to get active quad      Vasopneumatic   Number Minutes Vasopneumatic   15 minutes    Vasopnuematic Location   Knee    Vasopneumatic Pressure  Medium    Vasopneumatic Temperature   34      Manual Therapy   Manual Therapy  Passive ROM;Soft tissue mobilization    Passive ROM  knee flex and ext, some contract and relax to help gain flexion               PT Short Term Goals - 04/21/20 1356      PT SHORT TERM GOAL #1   Title  Ind with intial HEP    Time  2    Period  Weeks    Status  Achieved        PT Long Term Goals - 04/21/20 1357      PT LONG TERM GOAL #1   Title  independent with RICE    Time  8    Period  Weeks    Status  Partially Met      PT LONG TERM GOAL #2   Title  decrease pain 50%    Time  8    Period  Weeks    Status  Partially Met      PT LONG TERM GOAL #3   Title  increase left knee aROM to 5-115 degrees flexion    Baseline  90 deg (04/21/2020)    Time  8    Period  Weeks    Status  On-going      PT LONG TERM GOAL #4   Title  walk with SPC or without device for all household mobility    Time  8    Period  Weeks  Status  On-going      PT LONG TERM GOAL #5   Title  go up and down stairs step over step    Baseline  step ups to 4" step    Time  12    Period  Weeks    Status  On-going            Plan - 04/21/20 1355    Clinical Impression Statement  Pt demonstrates little improvement in flexion ROM and no improvement in circumferential edema of L knee. Pt continues to be limited in functional activity/ambulation by edema and knee ROM. Pt does  demonstrate increased steadiness with ambulation with SPC and is making progress towards stair progression. Continue to work on knee flexion ROM.    Personal Factors and Comorbidities  Comorbidity 3+    Comorbidities  OP, HTN, arthritis, "small stroke", long standing LBP    Stability/Clinical Decision Making  Evolving/Moderate complexity    Clinical Decision Making  Low    Rehab Potential  Good    PT Frequency  2x / week    PT Duration  8 weeks    PT Treatment/Interventions  ADLs/Self Care Home Management;Cryotherapy;Electrical Stimulation;Moist Heat;Ultrasound;Therapeutic activities;Therapeutic exercise;Neuromuscular re-education;Manual techniques;Patient/family education;Vasopneumatic Device;Functional mobility training;Stair training;Gait training    PT Next Visit Plan  continue to try to push ROM and function    Consulted and Agree with Plan of Care  Patient       Patient will benefit from skilled therapeutic intervention in order to improve the following deficits and impairments:  Pain, Decreased strength, Decreased range of motion, Abnormal gait, Improper body mechanics, Increased muscle spasms, Decreased mobility, Cardiopulmonary status limiting activity, Postural dysfunction, Difficulty walking, Impaired flexibility, Decreased activity tolerance, Decreased balance, Increased edema  Visit Diagnosis: Stiffness of left knee, not elsewhere classified  Acute pain of left knee  Difficulty in walking, not elsewhere classified  Localized edema  Acute bilateral low back pain with left-sided sciatica  Chronic pain of left knee     Problem List Patient Active Problem List   Diagnosis Date Noted  . Status post left knee replacement 03/14/2020  . S/P left TKA 03/11/2020  . Preoperative clearance 02/22/2019  . Colon polyp   . Lower GI bleed 12/18/2015  . Diverticulosis of colon with hemorrhage   . Nausea with vomiting 12/02/2014  . Ileus, postoperative ([place]) 11/04/2014  .  Hyponatremia 11/04/2014  . Abdominal fluid collection   . Chest pain 10/26/2014  . Acute calculous cholecystitis 10/26/2014  . Barrett esophagus   . History of GI diverticular bleed   . Essential hypertension   . Dyspnea 01/18/2012  . Aortic insufficiency 01/18/2012  . ANEMIA, IRON DEFICIENCY 06/04/2010  . HYPERCHOLESTEROLEMIA 02/08/2008  . GERD 02/08/2008  . ARTHRITIS 02/08/2008  . HIATAL HERNIA 11/02/2007  . DIVERTICULOSIS OF COLON 04/07/2004   Amador Cunas, PT, DPT [place] Heward 04/21/2020, 1:59 PM  [address] [place], [place], [address] Phone: [phone]   Fax:  [phone]  Name: Terri Shah MRN: [id number] Date of Birth: April 08, 1934

## 2020-04-23 ENCOUNTER — Other Ambulatory Visit: Payer: Self-pay

## 2020-04-23 ENCOUNTER — Ambulatory Visit: Payer: Medicare HMO | Admitting: Physical Therapy

## 2020-04-23 ENCOUNTER — Encounter: Payer: Self-pay | Admitting: Physical Therapy

## 2020-04-23 DIAGNOSIS — M25562 Pain in left knee: Secondary | ICD-10-CM | POA: Diagnosis not present

## 2020-04-23 DIAGNOSIS — R600 Localized edema: Secondary | ICD-10-CM

## 2020-04-23 DIAGNOSIS — G8929 Other chronic pain: Secondary | ICD-10-CM | POA: Diagnosis not present

## 2020-04-23 DIAGNOSIS — R262 Difficulty in walking, not elsewhere classified: Secondary | ICD-10-CM

## 2020-04-23 DIAGNOSIS — M5442 Lumbago with sciatica, left side: Secondary | ICD-10-CM

## 2020-04-23 DIAGNOSIS — M25662 Stiffness of left knee, not elsewhere classified: Secondary | ICD-10-CM | POA: Diagnosis not present

## 2020-04-23 NOTE — Therapy (Signed)
[place], [place], [address] Phone: [phone]   Fax:  [phone]  Physical Therapy Treatment  Patient Details  Name: Terri Shah MRN: [id number] Date of Birth: [birth year] Referring Provider (PT): Cathe Mons Date: 04/23/2020  PT End of Session - 04/23/20 1440    Visit Number  11    Date for PT Re-Evaluation  05/31/20    Authorization Type  [employer] Medicare    PT Start Time  1401    PT Stop Time  1458    PT Time Calculation (min)  57 min    Activity Tolerance  Patient limited by pain    Behavior During Therapy  [facility] for tasks assessed/performed       Past Medical History:  Diagnosis Date  . Allergy   . Anemia   . Arthritis   . Barrett esophagus   . Barrett's esophagus 11/02/2007   Qualifier: Diagnosis of  By: Mat Carne    . Blood transfusion without reported diagnosis    late 20's or early 30's  . Cataract   . Diverticulosis   . ECTOPIC PREGNANCY 02/08/2008   Qualifier: Diagnosis of  By: Mat Carne    . Family history of adverse reaction to anesthesia    older sister dont remember what it was   . Fibromyalgia    takes elavil  . Gastritis   . GASTRITIS 11/02/2007   Qualifier: Diagnosis of  By: Mat Carne    . GERD (gastroesophageal reflux disease)   . Glaucoma    right eye  . Heart murmur   . Hiatal hernia   . History of GI diverticular bleed   . Hyperlipidemia   . Hypertension   . Iron deficiency 2020  . Iron deficiency anemia   . Osteoporosis   . Palpitations 04/02/2010   Qualifier: Diagnosis of  By: Angelena Form, MD, Harrell Gave    . PONV (postoperative nausea and vomiting)    after c section  . Presbyesophagus   . Stroke [facility])    "small stroke" 2010- no residual per pt    Past Surgical History:  Procedure Laterality Date  . CATARACT EXTRACTION, BILATERAL    . CESAREAN SECTION  1950-1960s   x 3  . CHOLECYSTECTOMY    . COLONOSCOPY N/A  12/19/2015   Procedure: COLONOSCOPY;  Surgeon: Manus Gunning, MD;  Location: [facility];  Service: Gastroenterology;  Laterality: N/A;  . COLONOSCOPY    . [place]  . FACIAL COSMETIC SURGERY    . LAPAROSCOPIC CHOLECYSTECTOMY SINGLE SITE WITH INTRAOPERATIVE CHOLANGIOGRAM N/A 10/27/2014   Procedure: LAPAROSCOPIC CHOLECYSTECTOMY SINGLE SITE WITH INTRAOPERATIVE CHOLANGIOGRAM;  Surgeon: Woodard Perrell Boston, MD;  Location: [facility];  Service: General;  Laterality: N/A;  . TOTAL KNEE ARTHROPLASTY Left 03/11/2020   Procedure: TOTAL KNEE ARTHROPLASTY;  Surgeon: Paralee Cancel, MD;  Location: [facility];  Service: Orthopedics;  Laterality: Left;  70 mins  . TUBAL LIGATION    . UPPER GASTROINTESTINAL ENDOSCOPY      There were no vitals filed for this visit.  Subjective Assessment - 04/23/20 1404    Subjective  Patient reports frustration with the pain and stiffness    Currently in Pain?  Yes    Pain Score  4     Pain Location  Knee    Pain Orientation  Left    Aggravating Factors   bending walking  [facility] PT Assessment - 04/23/20 0001      PROM   Left Knee Flexion  92   after some contract relax                  [facility] Adult PT Treatment/Exercise - 04/23/20 0001      Knee/Hip Exercises: Aerobic   Nustep  L3, 6 min      Knee/Hip Exercises: Machines for Strengthening   Cybex Knee Extension  5# 3x10 with stretch between sets with PT overpressure    Cybex Knee Flexion  15# 2x10    Total Gym Leg Press  no weight 2x8, working on flexion, then left only no weight small motion trying to get active quad down to posistion 3 with pain      Knee/Hip Exercises: Supine   Short Arc Quad Sets  Left;2 sets;10 reps      Vasopneumatic   Number Minutes Vasopneumatic   15 minutes    Vasopnuematic Location   Knee    Vasopneumatic Pressure  Medium    Vasopneumatic Temperature   34      Manual Therapy   Manual Therapy  Passive ROM;Soft tissue mobilization     Soft tissue mobilization  mobilization of scar tissue    Passive ROM  knee flex and ext, some contract and relax to help gain flexion               PT Short Term Goals - 04/21/20 1356      PT SHORT TERM GOAL #1   Title  Ind with intial HEP    Time  2    Period  Weeks    Status  Achieved        PT Long Term Goals - 04/23/20 1442      PT LONG TERM GOAL #2   Title  decrease pain 50%    Status  On-going      PT LONG TERM GOAL #3   Title  increase left knee aROM to 5-115 degrees flexion    Status  On-going            Plan - 04/23/20 1440    Clinical Impression Statement  Patient really struggling with ROM, she is very very tight into flexion.  She is gaurded but seems to allow a little more flexion with contract relax.  Gait is getting better with the [place], and having a little more natural bend with less verbal cues    PT Next Visit Plan  continue to try to push ROM and function    Consulted and Agree with Plan of Care  Patient       Patient will benefit from skilled therapeutic intervention in order to improve the following deficits and impairments:  Pain, Decreased strength, Decreased range of motion, Abnormal gait, Improper body mechanics, Increased muscle spasms, Decreased mobility, Cardiopulmonary status limiting activity, Postural dysfunction, Difficulty walking, Impaired flexibility, Decreased activity tolerance, Decreased balance, Increased edema  Visit Diagnosis: Stiffness of left knee, not elsewhere classified  Acute pain of left knee  Difficulty in walking, not elsewhere classified  Localized edema  Acute bilateral low back pain with left-sided sciatica     Problem List Patient Active Problem List   Diagnosis Date Noted  . Status post left knee replacement 03/14/2020  . S/P left TKA 03/11/2020  . Preoperative clearance 02/22/2019  . Colon polyp   . Lower GI bleed 12/18/2015  . Diverticulosis of colon with hemorrhage   . Nausea with  vomiting 12/02/2014  . Ileus, postoperative ([place]) 11/04/2014  . Hyponatremia 11/04/2014  . Abdominal fluid collection   . Chest pain 10/26/2014  . Acute calculous cholecystitis 10/26/2014  . Barrett esophagus   . History of GI diverticular bleed   . Essential hypertension   . Dyspnea 01/18/2012  . Aortic insufficiency 01/18/2012  . ANEMIA, IRON DEFICIENCY 06/04/2010  . HYPERCHOLESTEROLEMIA 02/08/2008  . GERD 02/08/2008  . ARTHRITIS 02/08/2008  . HIATAL HERNIA 11/02/2007  . DIVERTICULOSIS OF COLON 04/07/2004    Sumner Boast., PT 04/23/2020, 2:42 PM  [place] [address] [place], [place], [address] Phone: [phone]   Fax:  [phone]  Name: ALIEGHA HOCKENBERRY MRN: [id number] Date of Birth: [birth year]

## 2020-04-25 ENCOUNTER — Other Ambulatory Visit: Payer: Self-pay

## 2020-04-25 ENCOUNTER — Ambulatory Visit: Payer: Medicare HMO | Admitting: Physical Therapy

## 2020-04-25 ENCOUNTER — Encounter: Payer: Self-pay | Admitting: Physical Therapy

## 2020-04-25 DIAGNOSIS — R262 Difficulty in walking, not elsewhere classified: Secondary | ICD-10-CM | POA: Diagnosis not present

## 2020-04-25 DIAGNOSIS — R600 Localized edema: Secondary | ICD-10-CM | POA: Diagnosis not present

## 2020-04-25 DIAGNOSIS — M5442 Lumbago with sciatica, left side: Secondary | ICD-10-CM

## 2020-04-25 DIAGNOSIS — M25562 Pain in left knee: Secondary | ICD-10-CM | POA: Diagnosis not present

## 2020-04-25 DIAGNOSIS — M25662 Stiffness of left knee, not elsewhere classified: Secondary | ICD-10-CM | POA: Diagnosis not present

## 2020-04-25 DIAGNOSIS — G8929 Other chronic pain: Secondary | ICD-10-CM | POA: Diagnosis not present

## 2020-04-25 NOTE — Therapy (Signed)
[place], [place], [address] Phone: [phone]   Fax:  [phone]  Physical Therapy Treatment  Patient Details  Name: Terri Shah MRN: [id number] Date of Birth: Feb 01, 1934 Referring Provider (PT): Cathe Mons Date: 04/25/2020  PT End of Session - 04/25/20 1139    Visit Number  12    Date for PT Re-Evaluation  05/31/20    Authorization Type  [employer]na Medicare    PT Start Time  1055    PT Stop Time  1149    PT Time Calculation (min)  54 min    Activity Tolerance  Patient limited by pain    Behavior During Therapy  [facility] for tasks assessed/performed       Past Medical History:  Diagnosis Date  . Allergy   . Anemia   . Arthritis   . Barrett esophagus   . Barrett's esophagus 11/02/2007   Qualifier: Diagnosis of  By: Mat Carne    . Blood transfusion without reported diagnosis    late 20's or early 30's  . Cataract   . Diverticulosis   . ECTOPIC PREGNANCY 02/08/2008   Qualifier: Diagnosis of  By: Mat Carne    . Family history of adverse reaction to anesthesia    older sister dont remember what it was   . Fibromyalgia    takes elavil  . Gastritis   . GASTRITIS 11/02/2007   Qualifier: Diagnosis of  By: Mat Carne    . GERD (gastroesophageal reflux disease)   . Glaucoma    right eye  . Heart murmur   . Hiatal hernia   . History of GI diverticular bleed   . Hyperlipidemia   . Hypertension   . Iron deficiency 2020  . Iron deficiency anemia   . Osteoporosis   . Palpitations 04/02/2010   Qualifier: Diagnosis of  By: Angelena Form, MD, Harrell Gave    . PONV (postoperative nausea and vomiting)    after c section  . Presbyesophagus   . Stroke [facility])    "small stroke" 2010- no residual per pt    Past Surgical History:  Procedure Laterality Date  . CATARACT EXTRACTION, BILATERAL    . CESAREAN SECTION  1950-1960s   x 3  . CHOLECYSTECTOMY    . COLONOSCOPY N/A  12/19/2015   Procedure: COLONOSCOPY;  Surgeon: Manus Gunning, MD;  Location: [facility];  Service: Gastroenterology;  Laterality: N/A;  . COLONOSCOPY    . [place]  . FACIAL COSMETIC SURGERY    . LAPAROSCOPIC CHOLECYSTECTOMY SINGLE SITE WITH INTRAOPERATIVE CHOLANGIOGRAM N/A 10/27/2014   Procedure: LAPAROSCOPIC CHOLECYSTECTOMY SINGLE SITE WITH INTRAOPERATIVE CHOLANGIOGRAM;  Surgeon: Clebert Wenger Boston, MD;  Location[facility];  Service: General;  Laterality: N/A;  . TOTAL KNEE ARTHROPLASTY Left 03/11/2020   Procedure: TOTAL KNEE ARTHROPLASTY;  Surgeon: Paralee Cancel, MD;  Location[facility];  Service: Orthopedics;  Laterality: Left;  70 mins  . TUBAL LIGATION    . UPPER GASTROINTESTINAL ENDOSCOPY      There were no vitals filed for this visit.  Subjective Assessment - 04/25/20 1101    Subjective  Patient reports that she is not hurting as much but still very stiff    Currently in Pain?  Yes    Pain Score  3     Pain Location  Knee    Pain Orientation  Left  [facility] Adult PT Treatment/Exercise - 04/25/20 0001      Knee/Hip Exercises: Aerobic   Nustep  L4, 6 min      Knee/Hip Exercises: Machines for Strengthening   Cybex Knee Extension  5# 3x10 with stretch between sets with PT overpressure    Cybex Knee Flexion  20# 2x10    Total Gym Leg Press  no weight 2x8, working on flexion, then left only no weight small motion trying to get active quad down to posistion 3 with pain      Knee/Hip Exercises: Standing   Other Standing Knee Exercises  6" toe clears, 6" lunge stretch with PT overpressure      Knee/Hip Exercises: Seated   Other Seated Knee/Hip Exercises  LLLD flexion stretch in sitting      Knee/Hip Exercises: Supine   Short Arc Quad Sets  Left;2 sets;10 reps      Moist Heat Therapy   Number Minutes Moist Heat  15 Minutes    Moist Heat Location  Lumbar Spine      Electrical Stimulation   Electrical Stimulation  Location  lumbar area    Electrical Stimulation Action  IFC    Electrical Stimulation Parameters  supine    Electrical Stimulation Goals  Pain      Vasopneumatic   Number Minutes Vasopneumatic   15 minutes    Vasopnuematic Location   Knee    Vasopneumatic Pressure  Medium    Vasopneumatic Temperature   34      Manual Therapy   Manual Therapy  Passive ROM;Soft tissue mobilization    Soft tissue mobilization  mobilization of scar tissue    Passive ROM  knee flex and ext, some contract and relax to help gain flexion               PT Short Term Goals - 04/21/20 1356      PT SHORT TERM GOAL #1   Title  Ind with intial HEP    Time  2    Period  Weeks    Status  Achieved        PT Long Term Goals - 04/25/20 1141      PT LONG TERM GOAL #1   Title  independent with RICE    Status  Partially Met      PT LONG TERM GOAL #2   Title  decrease pain 50%    Status  On-going      PT LONG TERM GOAL #3   Title  increase left knee aROM to 5-115 degrees flexion    Status  On-going            Plan - 04/25/20 1140    Clinical Impression Statement  Patient continues to fight the flexion, she is very swollen still.  She seems to be moving easier on the exercises but specific stretches are difficult due to pain with flexion    PT Next Visit Plan  continue to try to push ROM and function    Consulted and Agree with Plan of Care  Patient       Patient will benefit from skilled therapeutic intervention in order to improve the following deficits and impairments:  Pain, Decreased strength, Decreased range of motion, Abnormal gait, Improper body mechanics, Increased muscle spasms, Decreased mobility, Cardiopulmonary status limiting activity, Postural dysfunction, Difficulty walking, Impaired flexibility, Decreased activity tolerance, Decreased balance, Increased edema  Visit Diagnosis: Stiffness of left knee, not elsewhere classified  Acute pain of left knee  Difficulty in  walking, not elsewhere classified  Localized edema  Acute bilateral low back pain with left-sided sciatica     Problem List Patient Active Problem List   Diagnosis Date Noted  . Status post left knee replacement 03/14/2020  . S/P left TKA 03/11/2020  . Preoperative clearance 02/22/2019  . Colon polyp   . Lower GI bleed 12/18/2015  . Diverticulosis of colon with hemorrhage   . Nausea with vomiting 12/02/2014  . Ileus, postoperative ([place]) 11/04/2014  . Hyponatremia 11/04/2014  . Abdominal fluid collection   . Chest pain 10/26/2014  . Acute calculous cholecystitis 10/26/2014  . Barrett esophagus   . History of GI diverticular bleed   . Essential hypertension   . Dyspnea 01/18/2012  . Aortic insufficiency 01/18/2012  . ANEMIA, IRON DEFICIENCY 06/04/2010  . HYPERCHOLESTEROLEMIA 02/08/2008  . GERD 02/08/2008  . ARTHRITIS 02/08/2008  . HIATAL HERNIA 11/02/2007  . DIVERTICULOSIS OF COLON 04/07/2004    Sumner Boast., PT 04/25/2020, 11:41 AM  [place] [address] [place], [place], [address] Phone: [phone]   Fax:  [phone]  Name: Terri Shah MRN: [id number] Date of Birth: 1934/02/22

## 2020-04-28 ENCOUNTER — Other Ambulatory Visit: Payer: Self-pay

## 2020-04-28 ENCOUNTER — Ambulatory Visit: Payer: Medicare HMO | Admitting: Physical Therapy

## 2020-04-28 ENCOUNTER — Encounter: Payer: Self-pay | Admitting: Physical Therapy

## 2020-04-28 DIAGNOSIS — M25562 Pain in left knee: Secondary | ICD-10-CM | POA: Diagnosis not present

## 2020-04-28 DIAGNOSIS — R600 Localized edema: Secondary | ICD-10-CM

## 2020-04-28 DIAGNOSIS — M5442 Lumbago with sciatica, left side: Secondary | ICD-10-CM

## 2020-04-28 DIAGNOSIS — G8929 Other chronic pain: Secondary | ICD-10-CM

## 2020-04-28 DIAGNOSIS — R262 Difficulty in walking, not elsewhere classified: Secondary | ICD-10-CM

## 2020-04-28 DIAGNOSIS — M25662 Stiffness of left knee, not elsewhere classified: Secondary | ICD-10-CM

## 2020-04-28 NOTE — Therapy (Signed)
[place], [place], [address] Phone: [phone]   Fax:  [phone]  Physical Therapy Treatment  Patient Details  Name: Terri Shah MRN: [id number] Date of Birth: May 30, 1934 Referring Provider (PT): Cathe Mons Date: 04/28/2020  PT End of Session - 04/28/20 1402    Visit Number  13    Date for PT Re-Evaluation  05/31/20    Authorization Type  [employer]na Medicare    PT Start Time  1319    PT Stop Time  1415    PT Time Calculation (min)  56 min    Activity Tolerance  Patient limited by pain    Behavior During Therapy  [facility] for tasks assessed/performed       Past Medical History:  Diagnosis Date  . Allergy   . Anemia   . Arthritis   . Barrett esophagus   . Barrett's esophagus 11/02/2007   Qualifier: Diagnosis of  By: Mat Carne    . Blood transfusion without reported diagnosis    late 20's or early 30's  . Cataract   . Diverticulosis   . ECTOPIC PREGNANCY 02/08/2008   Qualifier: Diagnosis of  By: Mat Carne    . Family history of adverse reaction to anesthesia    older sister dont remember what it was   . Fibromyalgia    takes elavil  . Gastritis   . GASTRITIS 11/02/2007   Qualifier: Diagnosis of  By: Mat Carne    . GERD (gastroesophageal reflux disease)   . Glaucoma    right eye  . Heart murmur   . Hiatal hernia   . History of GI diverticular bleed   . Hyperlipidemia   . Hypertension   . Iron deficiency 2020  . Iron deficiency anemia   . Osteoporosis   . Palpitations 04/02/2010   Qualifier: Diagnosis of  By: Angelena Form, MD, Harrell Gave    . PONV (postoperative nausea and vomiting)    after c section  . Presbyesophagus   . Stroke [facility])    "small stroke" 2010- no residual per pt    Past Surgical History:  Procedure Laterality Date  . CATARACT EXTRACTION, BILATERAL    . CESAREAN SECTION  1950-1960s   x 3  . CHOLECYSTECTOMY    . COLONOSCOPY N/A  12/19/2015   Procedure: COLONOSCOPY;  Surgeon: Manus Gunning, MD;  Location: [facility];  Service: Gastroenterology;  Laterality: N/A;  . COLONOSCOPY    . [place]  . FACIAL COSMETIC SURGERY    . LAPAROSCOPIC CHOLECYSTECTOMY SINGLE SITE WITH INTRAOPERATIVE CHOLANGIOGRAM N/A 10/27/2014   Procedure: LAPAROSCOPIC CHOLECYSTECTOMY SINGLE SITE WITH INTRAOPERATIVE CHOLANGIOGRAM;  Surgeon: Michael Boston, MD;  [facility];  Service: General;  Laterality: N/A;  . TOTAL KNEE ARTHROPLASTY Left 03/11/2020   Procedure: TOTAL KNEE ARTHROPLASTY;  Surgeon: Paralee Cancel, MD;  [facility];  Service: Orthopedics;  Laterality: Left;  70 mins  . TUBAL LIGATION    . UPPER GASTROINTESTINAL ENDOSCOPY      There were no vitals filed for this visit.  Subjective Assessment - 04/28/20 1328    Subjective  Pt reports that she was very sore after last rx but knee is feeling better now. Reports that scar and front of knee feels very tight and painful still.    Currently in Pain?  Yes    Pain Score  4     Pain Location  Knee    Pain Orientation  Left                       [facility] Adult PT Treatment/Exercise - 04/28/20 0001      Knee/Hip Exercises: Stretches   Knee: Self-Stretch to increase Flexion  Left;2 reps;30 seconds    Knee: Self-Stretch Limitations  limited by pain      Knee/Hip Exercises: Aerobic   Nustep  L5 x 76mn      Knee/Hip Exercises: Machines for Strengthening   Cybex Knee Extension  5# 3x10 with stretch between sets with PT overpressure    Cybex Knee Flexion  20# 2x10    Total Gym Leg Press  no weight 2x8, working on flexion, then left only no weight small motion trying to get active quad down to posistion 3 with pain      Knee/Hip Exercises: Standing   Other Standing Knee Exercises  6" toe clears, 6" lunge stretch with PT overpressure               PT Short Term Goals - 04/21/20 1356      PT SHORT TERM GOAL #1   Title  Ind  with intial HEP    Time  2    Period  Weeks    Status  Achieved        PT Long Term Goals - 04/25/20 1141      PT LONG TERM GOAL #1   Title  independent with RICE    Status  Partially Met      PT LONG TERM GOAL #2   Title  decrease pain 50%    Status  On-going      PT LONG TERM GOAL #3   Title  increase left knee aROM to 5-115 degrees flexion    Status  On-going            Plan - 04/28/20 1403    Clinical Impression Statement  Pt continues to be limited in knee flexion d/t reports of pain and swelling. Pt advised to continue elevating LLE in order to help with reducting swelling. Continue to progress ROM.    PT Treatment/Interventions  ADLs/Self Care Home Management;Cryotherapy;Electrical Stimulation;Moist Heat;Ultrasound;Therapeutic activities;Therapeutic exercise;Neuromuscular re-education;Manual techniques;Patient/family education;Vasopneumatic Device;Functional mobility training;Stair training;Gait training    PT Next Visit Plan  continue to try to push ROM and function    Consulted and Agree with Plan of Care  Patient       Patient will benefit from skilled therapeutic intervention in order to improve the following deficits and impairments:  Pain, Decreased strength, Decreased range of motion, Abnormal gait, Improper body mechanics, Increased muscle spasms, Decreased mobility, Cardiopulmonary status limiting activity, Postural dysfunction, Difficulty walking, Impaired flexibility, Decreased activity tolerance, Decreased balance, Increased edema  Visit Diagnosis: Stiffness of left knee, not elsewhere classified  Acute pain of left knee  Difficulty in walking, not elsewhere classified  Localized edema  Acute bilateral low back pain with left-sided sciatica  Chronic pain of left knee     Problem List Patient Active Problem List   Diagnosis Date Noted  . Status post left knee replacement 03/14/2020  . S/P left TKA 03/11/2020  . Preoperative clearance  02/22/2019  . Colon polyp   . Lower GI bleed 12/18/2015  . Diverticulosis of colon with hemorrhage   . Nausea with vomiting 12/02/2014  . [place] 11/04/2014  . Hyponatremia 11/04/2014  . Abdominal fluid collection   . Chest pain 10/26/2014  .  Acute calculous cholecystitis 10/26/2014  . Barrett esophagus   . History of GI diverticular bleed   . Essential hypertension   . Dyspnea 01/18/2012  . Aortic insufficiency 01/18/2012  . ANEMIA, IRON DEFICIENCY 06/04/2010  . HYPERCHOLESTEROLEMIA 02/08/2008  . GERD 02/08/2008  . ARTHRITIS 02/08/2008  . HIATAL HERNIA 11/02/2007  . DIVERTICULOSIS OF COLON 04/07/2004   Amador Cunas, PT, DPT Donald Prose Sahand Gosch 04/28/2020, 2:06 PM  [place] [address] [place], [place], [address] Phone: [phone]   Fax:  [phone]  Name: KAMBRI DISMORE MRN: [id number] Date of Birth: 1934-09-29

## 2020-04-30 ENCOUNTER — Ambulatory Visit: Payer: Medicare HMO | Admitting: Physical Therapy

## 2020-04-30 ENCOUNTER — Encounter: Payer: Self-pay | Admitting: Physical Therapy

## 2020-04-30 ENCOUNTER — Other Ambulatory Visit: Payer: Self-pay

## 2020-04-30 DIAGNOSIS — R262 Difficulty in walking, not elsewhere classified: Secondary | ICD-10-CM | POA: Diagnosis not present

## 2020-04-30 DIAGNOSIS — M25662 Stiffness of left knee, not elsewhere classified: Secondary | ICD-10-CM

## 2020-04-30 DIAGNOSIS — R600 Localized edema: Secondary | ICD-10-CM

## 2020-04-30 DIAGNOSIS — M5442 Lumbago with sciatica, left side: Secondary | ICD-10-CM | POA: Diagnosis not present

## 2020-04-30 DIAGNOSIS — G8929 Other chronic pain: Secondary | ICD-10-CM

## 2020-04-30 DIAGNOSIS — M25562 Pain in left knee: Secondary | ICD-10-CM | POA: Diagnosis not present

## 2020-04-30 NOTE — Therapy (Signed)
[place], [place], [address] Phone: [phone]   Fax:  [phone]  Physical Therapy Treatment  Patient Details  Name: Terri Shah MRN: [id number] Date of Birth: [birth year] Referring Provider (PT): Cathe Mons Date: 04/30/2020  PT End of Session - 04/30/20 1356    Visit Number  14    Date for PT Re-Evaluation  05/31/20    Authorization Type  [employer] Medicare    PT Start Time  1315    PT Stop Time  1407    PT Time Calculation (min)  52 min    Activity Tolerance  Patient limited by pain    Behavior During Therapy  [facility] for tasks assessed/performed       Past Medical History:  Diagnosis Date  . Allergy   . Anemia   . Arthritis   . Barrett esophagus   . Barrett's esophagus 11/02/2007   Qualifier: Diagnosis of  By: Mat Carne    . Blood transfusion without reported diagnosis    late 20's or early 30's  . Cataract   . Diverticulosis   . ECTOPIC PREGNANCY 02/08/2008   Qualifier: Diagnosis of  By: Mat Carne    . Family history of adverse reaction to anesthesia    older sister dont remember what it was   . Fibromyalgia    takes elavil  . Gastritis   . GASTRITIS 11/02/2007   Qualifier: Diagnosis of  By: Mat Carne    . GERD (gastroesophageal reflux disease)   . Glaucoma    right eye  . Heart murmur   . Hiatal hernia   . History of GI diverticular bleed   . Hyperlipidemia   . Hypertension   . Iron deficiency 2020  . Iron deficiency anemia   . Osteoporosis   . Palpitations 04/02/2010   Qualifier: Diagnosis of  By: Angelena Form, MD, Harrell Gave    . PONV (postoperative nausea and vomiting)    after c section  . Presbyesophagus   . Stroke [facility])    "small stroke" 2010- no residual per pt    Past Surgical History:  Procedure Laterality Date  . CATARACT EXTRACTION, BILATERAL    . CESAREAN SECTION  1950-1960s   x 3  . CHOLECYSTECTOMY    . COLONOSCOPY N/A  12/19/2015   Procedure: COLONOSCOPY;  Surgeon: Manus Gunning, MD;  Location: [facility];  Service: Gastroenterology;  Laterality: N/A;  . COLONOSCOPY    . [place]  . FACIAL COSMETIC SURGERY    . LAPAROSCOPIC CHOLECYSTECTOMY SINGLE SITE WITH INTRAOPERATIVE CHOLANGIOGRAM N/A 10/27/2014   Procedure: LAPAROSCOPIC CHOLECYSTECTOMY SINGLE SITE WITH INTRAOPERATIVE CHOLANGIOGRAM;  Surgeon: Michael Boston, MD;  Location: [facility];  Service: General;  Laterality: N/A;  . TOTAL KNEE ARTHROPLASTY Left 03/11/2020   Procedure: TOTAL KNEE ARTHROPLASTY;  Surgeon: Paralee Cancel, MD;  Location: [facility];  Service: Orthopedics;  Laterality: Left;  70 mins  . TUBAL LIGATION    . UPPER GASTROINTESTINAL ENDOSCOPY      There were no vitals filed for this visit.  Subjective Assessment - 04/30/20 1323    Subjective  Pt reports that she is still working on knee ROM at home    Currently in Pain?  Yes    Pain Score  2     Pain Location  Knee    Pain Orientation  Left  [facility] Adult PT Treatment/Exercise - 04/30/20 0001      Knee/Hip Exercises: Aerobic   Nustep  L5 x 8 min      Knee/Hip Exercises: Machines for Strengthening   Cybex Knee Extension  5# 3x10 with stretch between sets with PT overpressure    Cybex Knee Flexion  25# 2x10      Knee/Hip Exercises: Seated   Other Seated Knee/Hip Exercises  LLLD flexion stretch in sitting      Moist Heat Therapy   Number Minutes Moist Heat  15 Minutes    Moist Heat Location  Lumbar Spine      Vasopneumatic   Number Minutes Vasopneumatic   15 minutes    Vasopnuematic Location   Knee    Vasopneumatic Pressure  Medium    Vasopneumatic Temperature   34      Manual Therapy   Manual Therapy  Passive ROM;Soft tissue mobilization    Soft tissue mobilization  mobilization of scar tissue    Passive ROM  knee flex and ext, some contract and relax to help gain flexion               PT Short  Term Goals - 04/21/20 1356      PT SHORT TERM GOAL #1   Title  Ind with intial HEP    Time  2    Period  Weeks    Status  Achieved        PT Long Term Goals - 04/25/20 1141      PT LONG TERM GOAL #1   Title  independent with RICE    Status  Partially Met      PT LONG TERM GOAL #2   Title  decrease pain 50%    Status  On-going      PT LONG TERM GOAL #3   Title  increase left knee aROM to 5-115 degrees flexion    Status  On-going            Plan - 04/30/20 1357    Clinical Impression Statement  Pt continues to be limited in knee flexion d/t pain and swelling. Pt demonstrated gait with SPC in clinic today with no LOB and supervision assist. Continue to progress ROM.    PT Treatment/Interventions  ADLs/Self Care Home Management;Cryotherapy;Electrical Stimulation;Moist Heat;Ultrasound;Therapeutic activities;Therapeutic exercise;Neuromuscular re-education;Manual techniques;Patient/family education;Vasopneumatic Device;Functional mobility training;Stair training;Gait training    PT Next Visit Plan  continue to try to push ROM and function    Consulted and Agree with Plan of Care  Patient       Patient will benefit from skilled therapeutic intervention in order to improve the following deficits and impairments:  Pain, Decreased strength, Decreased range of motion, Abnormal gait, Improper body mechanics, Increased muscle spasms, Decreased mobility, Cardiopulmonary status limiting activity, Postural dysfunction, Difficulty walking, Impaired flexibility, Decreased activity tolerance, Decreased balance, Increased edema  Visit Diagnosis: Stiffness of left knee, not elsewhere classified  Acute pain of left knee  Difficulty in walking, not elsewhere classified  Localized edema  Acute bilateral low back pain with left-sided sciatica  Chronic pain of left knee     Problem List Patient Active Problem List   Diagnosis Date Noted  . Status post left knee replacement  03/14/2020  . S/P left TKA 03/11/2020  . Preoperative clearance 02/22/2019  . Colon polyp   . Lower GI bleed 12/18/2015  . Diverticulosis of colon with hemorrhage   . Nausea with vomiting 12/02/2014  . Ileus, postoperative ([place]) 11/04/2014  .  Hyponatremia 11/04/2014  . Abdominal fluid collection   . Chest pain 10/26/2014  . Acute calculous cholecystitis 10/26/2014  . Barrett esophagus   . History of GI diverticular bleed   . Essential hypertension   . Dyspnea 01/18/2012  . Aortic insufficiency 01/18/2012  . ANEMIA, IRON DEFICIENCY 06/04/2010  . HYPERCHOLESTEROLEMIA 02/08/2008  . GERD 02/08/2008  . ARTHRITIS 02/08/2008  . HIATAL HERNIA 11/02/2007  . DIVERTICULOSIS OF COLON 04/07/2004   Amador Cunas, PT, DPT Donald Prose Jovi Zavadil 04/30/2020, 1:58 PM  [place] [address] [place], [place], [address] Phone: [phone]   Fax:  [phone]  Name: Terri Shah MRN: [id number] Date of Birth: 12-07-1934

## 2020-05-01 DIAGNOSIS — Z96652 Presence of left artificial knee joint: Secondary | ICD-10-CM | POA: Diagnosis not present

## 2020-05-01 DIAGNOSIS — Z471 Aftercare following joint replacement surgery: Secondary | ICD-10-CM | POA: Diagnosis not present

## 2020-05-02 ENCOUNTER — Ambulatory Visit: Payer: Medicare HMO | Admitting: Physical Therapy

## 2020-05-02 ENCOUNTER — Encounter: Payer: Self-pay | Admitting: Physical Therapy

## 2020-05-02 ENCOUNTER — Other Ambulatory Visit: Payer: Self-pay

## 2020-05-02 DIAGNOSIS — R262 Difficulty in walking, not elsewhere classified: Secondary | ICD-10-CM

## 2020-05-02 DIAGNOSIS — R600 Localized edema: Secondary | ICD-10-CM | POA: Diagnosis not present

## 2020-05-02 DIAGNOSIS — M25662 Stiffness of left knee, not elsewhere classified: Secondary | ICD-10-CM

## 2020-05-02 DIAGNOSIS — G8929 Other chronic pain: Secondary | ICD-10-CM | POA: Diagnosis not present

## 2020-05-02 DIAGNOSIS — M25562 Pain in left knee: Secondary | ICD-10-CM | POA: Diagnosis not present

## 2020-05-02 DIAGNOSIS — M5442 Lumbago with sciatica, left side: Secondary | ICD-10-CM | POA: Diagnosis not present

## 2020-05-02 NOTE — Therapy (Signed)
[place], [place], [address] Phone: [phone]   Fax:  [phone]  Physical Therapy Treatment  Patient Details  Name: Terri Shah MRN: [id number] Date of Birth: [birth year] Referring Provider (PT): Cathe Mons Date: 05/02/2020  PT End of Session - 05/02/20 1143    Visit Number  15    Date for PT Re-Evaluation  05/31/20    Authorization Type  [employer]na Medicare    PT Start Time  1054    PT Stop Time  1153    PT Time Calculation (min)  59 min    Activity Tolerance  Patient limited by pain    Behavior During Therapy  [facility] for tasks assessed/performed       Past Medical History:  Diagnosis Date  . Allergy   . Anemia   . Arthritis   . Barrett esophagus   . Barrett's esophagus 11/02/2007   Qualifier: Diagnosis of  By: Mat Carne    . Blood transfusion without reported diagnosis    late 20's or early 30's  . Cataract   . Diverticulosis   . ECTOPIC PREGNANCY 02/08/2008   Qualifier: Diagnosis of  By: Mat Carne    . Family history of adverse reaction to anesthesia    older sister dont remember what it was   . Fibromyalgia    takes elavil  . Gastritis   . GASTRITIS 11/02/2007   Qualifier: Diagnosis of  By: Mat Carne    . GERD (gastroesophageal reflux disease)   . Glaucoma    right eye  . Heart murmur   . Hiatal hernia   . History of GI diverticular bleed   . Hyperlipidemia   . Hypertension   . Iron deficiency 2020  . Iron deficiency anemia   . Osteoporosis   . Palpitations 04/02/2010   Qualifier: Diagnosis of  By: Angelena Form, MD, Harrell Gave    . PONV (postoperative nausea and vomiting)    after c section  . Presbyesophagus   . Stroke [facility])    "small stroke" 2010- no residual per pt    Past Surgical History:  Procedure Laterality Date  . CATARACT EXTRACTION, BILATERAL    . CESAREAN SECTION  1950-1960s   x 3  . CHOLECYSTECTOMY    . COLONOSCOPY N/A  12/19/2015   Procedure: COLONOSCOPY;  Surgeon: Manus Gunning, MD;  Location: [facility];  Service: Gastroenterology;  Laterality: N/A;  . COLONOSCOPY    . [place]  . FACIAL COSMETIC SURGERY    . LAPAROSCOPIC CHOLECYSTECTOMY SINGLE SITE WITH INTRAOPERATIVE CHOLANGIOGRAM N/A 10/27/2014   Procedure: LAPAROSCOPIC CHOLECYSTECTOMY SINGLE SITE WITH INTRAOPERATIVE CHOLANGIOGRAM;  Surgeon: Nocholas Damaso Boston, MD;  [facility];  Service: General;  Laterality: N/A;  . TOTAL KNEE ARTHROPLASTY Left 03/11/2020   Procedure: TOTAL KNEE ARTHROPLASTY;  Surgeon: Paralee Cancel, MD;  [facility];  Service: Orthopedics;  Laterality: Left;  70 mins  . TUBAL LIGATION    . UPPER GASTROINTESTINAL ENDOSCOPY      There were no vitals filed for this visit.  Subjective Assessment - 05/02/20 1055    Subjective  Patient reports that she saw the MD and he was not happy with her ROM, asked her to do exercises every hour, her issue with [place] has been times, she reports that she cannot come early and we have been full.  She reports that she does not do the exercises  due to her worried about back pain    Currently in Pain?  Yes    Pain Score  3     Pain Location  Knee    Pain Orientation  Left    Aggravating Factors   any bending         [facility] PT Assessment - 05/02/20 0001      PROM   Left Knee Flexion  98                    [facility] Adult PT Treatment/Exercise - 05/02/20 0001      Ambulation/Gait   Gait Comments  with SPC, focus on step length and bending the knee      Knee/Hip Exercises: Aerobic   Nustep  L5 x 8 min      Knee/Hip Exercises: Machines for Strengthening   Cybex Knee Extension  5# 3x10 with stretch between sets with PT overpressure    Cybex Knee Flexion  10# 3x10 left only    Total Gym Leg Press  no weight both legs and no legs really working on flexion      Vasopneumatic   Number Minutes Vasopneumatic   10 minutes    Vasopnuematic Location    Knee    Vasopneumatic Pressure  Medium    Vasopneumatic Temperature   37      Manual Therapy   Manual Therapy  Passive ROM;Soft tissue mobilization    Soft tissue mobilization  mobilization of scar tissue    Passive ROM  knee flex and ext, some contract and relax to help gain flexion               PT Short Term Goals - 04/21/20 1356      PT SHORT TERM GOAL #1   Title  Ind with intial HEP    Time  2    Period  Weeks    Status  Achieved        PT Long Term Goals - 05/02/20 1146      PT LONG TERM GOAL #1   Title  independent with RICE    Status  Achieved      PT LONG TERM GOAL #2   Title  decrease pain 50%    Status  Partially Met            Plan - 05/02/20 1144    Clinical Impression Statement  Patient saw the MD yesterday she reports tha the was not happy with her ROM, she has been limited by her pain levels, she does not tolerate the stretch, reports that she does not do much at home due to pain, reports the MD added to her exercises and she told me should could not do it due to pain, I encouraged her to do every hour but really just try to relax as she is very gaurded all of the time.  I did get her to relax and pushed her to 98 degrees flexion today    PT Next Visit Plan  continue to try to push ROM and function    Consulted and Agree with Plan of Care  Patient       Patient will benefit from skilled therapeutic intervention in order to improve the following deficits and impairments:  Pain, Decreased strength, Decreased range of motion, Abnormal gait, Improper body mechanics, Increased muscle spasms, Decreased mobility, Cardiopulmonary status limiting activity, Postural dysfunction, Difficulty walking, Impaired flexibility, Decreased activity tolerance, Decreased balance, Increased edema  Visit Diagnosis:  Stiffness of left knee, not elsewhere classified  Acute pain of left knee  Difficulty in walking, not elsewhere classified  Localized  edema     Problem List Patient Active Problem List   Diagnosis Date Noted  . Status post left knee replacement 03/14/2020  . S/P left TKA 03/11/2020  . Preoperative clearance 02/22/2019  . Colon polyp   . Lower GI bleed 12/18/2015  . Diverticulosis of colon with hemorrhage   . Nausea with vomiting 12/02/2014  . Ileus, postoperative ([place]) 11/04/2014  . Hyponatremia 11/04/2014  . Abdominal fluid collection   . Chest pain 10/26/2014  . Acute calculous cholecystitis 10/26/2014  . Barrett esophagus   . History of GI diverticular bleed   . Essential hypertension   . Dyspnea 01/18/2012  . Aortic insufficiency 01/18/2012  . ANEMIA, IRON DEFICIENCY 06/04/2010  . HYPERCHOLESTEROLEMIA 02/08/2008  . GERD 02/08/2008  . ARTHRITIS 02/08/2008  . HIATAL HERNIA 11/02/2007  . DIVERTICULOSIS OF COLON 04/07/2004    Sumner Boast., PT 05/02/2020, 11:55 AM  [address] [place], [place], [address] Phone: [phone]   Fax:  [phone]  Name: Terri Shah MRN: [id number] Date of Birth: August 13, 1934

## 2020-05-06 ENCOUNTER — Ambulatory Visit: Payer: Medicare HMO | Admitting: Physical Therapy

## 2020-05-06 ENCOUNTER — Encounter: Payer: Self-pay | Admitting: Physical Therapy

## 2020-05-06 ENCOUNTER — Other Ambulatory Visit: Payer: Self-pay

## 2020-05-06 DIAGNOSIS — M25662 Stiffness of left knee, not elsewhere classified: Secondary | ICD-10-CM

## 2020-05-06 DIAGNOSIS — M5442 Lumbago with sciatica, left side: Secondary | ICD-10-CM

## 2020-05-06 DIAGNOSIS — G8929 Other chronic pain: Secondary | ICD-10-CM | POA: Diagnosis not present

## 2020-05-06 DIAGNOSIS — R600 Localized edema: Secondary | ICD-10-CM

## 2020-05-06 DIAGNOSIS — R262 Difficulty in walking, not elsewhere classified: Secondary | ICD-10-CM | POA: Diagnosis not present

## 2020-05-06 DIAGNOSIS — M25562 Pain in left knee: Secondary | ICD-10-CM | POA: Diagnosis not present

## 2020-05-06 NOTE — Therapy (Signed)
[place], [place], [address] Phone: [phone]   Fax:  [phone]  Physical Therapy Treatment  Patient Details  Name: Terri Shah MRN: [id number] Date of Birth: 04-20-1934 Referring Provider (PT): Cathe Mons Date: 05/06/2020  PT End of Session - 05/06/20 1350    Visit Number  16    Date for PT Re-Evaluation  05/31/20    Authorization Type  A[employer]na Medicare    PT Start Time  6203    PT Stop Time  1405    PT Time Calculation (min)  57 min    Activity Tolerance  Patient tolerated treatment well    Behavior During Therapy  [facility] for tasks assessed/performed       Past Medical History:  Diagnosis Date  . Allergy   . Anemia   . Arthritis   . Barrett esophagus   . Barrett's esophagus 11/02/2007   Qualifier: Diagnosis of  By: Mat Carne    . Blood transfusion without reported diagnosis    late 20's or early 30's  . Cataract   . Diverticulosis   . ECTOPIC PREGNANCY 02/08/2008   Qualifier: Diagnosis of  By: Mat Carne    . Family history of adverse reaction to anesthesia    older sister dont remember what it was   . Fibromyalgia    takes elavil  . Gastritis   . GASTRITIS 11/02/2007   Qualifier: Diagnosis of  By: Mat Carne    . GERD (gastroesophageal reflux disease)   . Glaucoma    right eye  . Heart murmur   . Hiatal hernia   . History of GI diverticular bleed   . Hyperlipidemia   . Hypertension   . Iron deficiency 2020  . Iron deficiency anemia   . Osteoporosis   . Palpitations 04/02/2010   Qualifier: Diagnosis of  By: Angelena Form, MD, Harrell Gave    . PONV (postoperative nausea and vomiting)    after c section  . Presbyesophagus   . Stroke [facility])    "small stroke" 2010- no residual per pt    Past Surgical History:  Procedure Laterality Date  . CATARACT EXTRACTION, BILATERAL    . CESAREAN SECTION  1950-1960s   x 3  . CHOLECYSTECTOMY    . COLONOSCOPY  N/A 12/19/2015   Procedure: COLONOSCOPY;  Surgeon: Manus Gunning, MD;  Location: [facility];  Service: Gastroenterology;  Laterality: N/A;  . COLONOSCOPY    . [place]  . FACIAL COSMETIC SURGERY    . LAPAROSCOPIC CHOLECYSTECTOMY SINGLE SITE WITH INTRAOPERATIVE CHOLANGIOGRAM N/A 10/27/2014   Procedure: LAPAROSCOPIC CHOLECYSTECTOMY SINGLE SITE WITH INTRAOPERATIVE CHOLANGIOGRAM;  Surgeon: Jullian Previti Boston, MD;  [facility];  Service: General;  Laterality: N/A;  . TOTAL KNEE ARTHROPLASTY Left 03/11/2020   Procedure: TOTAL KNEE ARTHROPLASTY;  Surgeon: Paralee Cancel, MD;  [facility];  Service: Orthopedics;  Laterality: Left;  70 mins  . TUBAL LIGATION    . UPPER GASTROINTESTINAL ENDOSCOPY      There were no vitals filed for this visit.  Subjective Assessment - 05/06/20 1321    Subjective  [name] report sthat she has been diligent about her exercises, she walks in with a cane today    Currently in Pain?  Yes    Pain Score  4     Pain Location  Knee    Pain Orientation  Left    Aggravating  Factors   bending         [facility] PT Assessment - 05/06/20 0001      PROM   Left Knee Flexion  100                    [facility] Adult PT Treatment/Exercise - 05/06/20 0001      Ambulation/Gait   Gait Comments  with SPC, focus on step length and bending the knee, much improved today less timidness      Knee/Hip Exercises: Aerobic   Recumbent Bike  5 minutes, partial revs for 4 minutes then full revs with some hip hike    Nustep  L5 x 8 min      Knee/Hip Exercises: Machines for Strengthening   Cybex Knee Extension  5# 3x10 with stretch between sets with PT overpressure    Cybex Knee Flexion  15# 3x10    Total Gym Leg Press  no weight both legs and no legs really working on flexion down to position 2      Vasopneumatic   Number Minutes Vasopneumatic   10 minutes    Vasopnuematic Location   Knee    Vasopneumatic Pressure  Medium     Vasopneumatic Temperature   34      Manual Therapy   Manual Therapy  Passive ROM;Soft tissue mobilization    Soft tissue mobilization  mobilization of scar tissue    Passive ROM  knee flex and ext, some contract and relax to help gain flexion               PT Short Term Goals - 04/21/20 1356      PT SHORT TERM GOAL #1   Title  Ind with intial HEP    Time  2    Period  Weeks    Status  Achieved        PT Long Term Goals - 05/02/20 1146      PT LONG TERM GOAL #1   Title  independent with RICE    Status  Achieved      PT LONG TERM GOAL #2   Title  decrease pain 50%    Status  Partially Met            Plan - 05/06/20 1350    Clinical Impression Statement  Patient really seems to be doing better, she looks like she feels better and is moving with more confidence, she was able to go around on the bike some with pain and hip hike.  She is very gaurded and tender to start the stretching but tends to allw more with cues to relax, she did not resist and gaurd as much today    PT Next Visit Plan  continue to try to push ROM and function    Consulted and Agree with Plan of Care  Patient       Patient will benefit from skilled therapeutic intervention in order to improve the following deficits and impairments:  Pain, Decreased strength, Decreased range of motion, Abnormal gait, Improper body mechanics, Increased muscle spasms, Decreased mobility, Cardiopulmonary status limiting activity, Postural dysfunction, Difficulty walking, Impaired flexibility, Decreased activity tolerance, Decreased balance, Increased edema  Visit Diagnosis: Stiffness of left knee, not elsewhere classified  Acute pain of left knee  Difficulty in walking, not elsewhere classified  Localized edema  Acute bilateral low back pain with left-sided sciatica     Problem List Patient Active Problem List   Diagnosis Date Noted  .  Status post left knee replacement 03/14/2020  . S/P left TKA  03/11/2020  . Preoperative clearance 02/22/2019  . Colon polyp   . Lower GI bleed 12/18/2015  . Diverticulosis of colon with hemorrhage   . Nausea with vomiting 12/02/2014  . Ileus, postoperative ([place]) 11/04/2014  . Hyponatremia 11/04/2014  . Abdominal fluid collection   . Chest pain 10/26/2014  . Acute calculous cholecystitis 10/26/2014  . Barrett esophagus   . History of GI diverticular bleed   . Essential hypertension   . Dyspnea 01/18/2012  . Aortic insufficiency 01/18/2012  . ANEMIA, IRON DEFICIENCY 06/04/2010  . HYPERCHOLESTEROLEMIA 02/08/2008  . GERD 02/08/2008  . ARTHRITIS 02/08/2008  . HIATAL HERNIA 11/02/2007  . DIVERTICULOSIS OF COLON 04/07/2004    Sumner Boast., PT 05/06/2020, 1:52 PM  [address] [place], [place], [address] Phone: [phone]   Fax:  [phone]  Name: LEAHA CUERVO MRN: [id number] Date of Birth: 1934/11/06

## 2020-05-08 ENCOUNTER — Other Ambulatory Visit: Payer: Self-pay

## 2020-05-08 ENCOUNTER — Encounter: Payer: Self-pay | Admitting: Physical Therapy

## 2020-05-08 ENCOUNTER — Ambulatory Visit: Payer: Medicare HMO | Admitting: Physical Therapy

## 2020-05-08 DIAGNOSIS — M25662 Stiffness of left knee, not elsewhere classified: Secondary | ICD-10-CM

## 2020-05-08 DIAGNOSIS — R600 Localized edema: Secondary | ICD-10-CM

## 2020-05-08 DIAGNOSIS — R262 Difficulty in walking, not elsewhere classified: Secondary | ICD-10-CM

## 2020-05-08 DIAGNOSIS — G8929 Other chronic pain: Secondary | ICD-10-CM | POA: Diagnosis not present

## 2020-05-08 DIAGNOSIS — M5442 Lumbago with sciatica, left side: Secondary | ICD-10-CM

## 2020-05-08 DIAGNOSIS — M25562 Pain in left knee: Secondary | ICD-10-CM | POA: Diagnosis not present

## 2020-05-08 NOTE — Therapy (Signed)
[place] [address] [place], [place], [address] Phone: [phone]   Fax:  [phone]  Physical Therapy Treatment  Patient Details  Name: Terri Shah MRN: [id number] Date of Birth: 1934-05-25 Referring Provider (PT): Cathe Mons Date: 05/08/2020  PT End of Session - 05/08/20 1655    Visit Number  17    Date for PT Re-Evaluation  05/31/20    Authorization Type  [employer] Medicare    PT Start Time  4401    PT Stop Time  1710    PT Time Calculation (min)  57 min    Activity Tolerance  Patient tolerated treatment well    Behavior During Therapy  [facility] for tasks assessed/performed       Past Medical History:  Diagnosis Date  . Allergy   . Anemia   . Arthritis   . Barrett esophagus   . Barrett's esophagus 11/02/2007   Qualifier: Diagnosis of  By: Mat Carne    . Blood transfusion without reported diagnosis    late 20's or early 30's  . Cataract   . Diverticulosis   . ECTOPIC PREGNANCY 02/08/2008   Qualifier: Diagnosis of  By: Mat Carne    . Family history of adverse reaction to anesthesia    older sister dont remember what it was   . Fibromyalgia    takes elavil  . Gastritis   . GASTRITIS 11/02/2007   Qualifier: Diagnosis of  By: Mat Carne    . GERD (gastroesophageal reflux disease)   . Glaucoma    right eye  . Heart murmur   . Hiatal hernia   . History of GI diverticular bleed   . Hyperlipidemia   . Hypertension   . Iron deficiency 2020  . Iron deficiency anemia   . Osteoporosis   . Palpitations 04/02/2010   Qualifier: Diagnosis of  By: Angelena Form, MD, Harrell Gave    . PONV (postoperative nausea and vomiting)    after c section  . Presbyesophagus   . Stroke [facility])    "small stroke" 2010- no residual per pt    Past Surgical History:  Procedure Laterality Date  . CATARACT EXTRACTION, BILATERAL    . CESAREAN SECTION  1950-1960s   x 3  . CHOLECYSTECTOMY    . COLONOSCOPY  N/A 12/19/2015   Procedure: COLONOSCOPY;  Surgeon: Manus Gunning, MD;  Location: [facility];  Service: Gastroenterology;  Laterality: N/A;  . COLONOSCOPY    . [place]  . FACIAL COSMETIC SURGERY    . LAPAROSCOPIC CHOLECYSTECTOMY SINGLE SITE WITH INTRAOPERATIVE CHOLANGIOGRAM N/A 10/27/2014   Procedure: LAPAROSCOPIC CHOLECYSTECTOMY SINGLE SITE WITH INTRAOPERATIVE CHOLANGIOGRAM;  Surgeon: Matraca Hunkins Boston, MD;  Location: [facility];  Service: General;  Laterality: N/A;  . TOTAL KNEE ARTHROPLASTY Left 03/11/2020   Procedure: TOTAL KNEE ARTHROPLASTY;  Surgeon: Paralee Cancel, MD;  Location: [facility];  Service: Orthopedics;  Laterality: Left;  70 mins  . TUBAL LIGATION    . UPPER GASTROINTESTINAL ENDOSCOPY      There were no vitals filed for this visit.  Subjective Assessment - 05/08/20 1622    Subjective  Reports that she has been a little dizzy, reports that the knee pain is getting better.    Currently in Pain?  Yes    Pain Score  2     Pain Location  Knee    Pain Orientation  Left  [facility] PT Assessment - 05/08/20 0001      AROM   Left Knee Flexion  95      PROM   Left Knee Flexion  101                    [facility] Adult PT Treatment/Exercise - 05/08/20 0001      Ambulation/Gait   Gait Comments  gait without device 50 feet x 2 SBA and some cues      Knee/Hip Exercises: Aerobic   Recumbent Bike  5 minutes, partial revs for 4 minutes then full revs with some hip hike    Nustep  L5 x 6 min      Knee/Hip Exercises: Machines for Strengthening   Cybex Knee Extension  5# 3x10 with stretch between sets with PT overpressure    Cybex Knee Flexion  20# 3x10    Total Gym Leg Press  no weight both legs and no legs really working on flexion down to position 1      Vasopneumatic   Number Minutes Vasopneumatic   10 minutes    Vasopnuematic Location   Knee    Vasopneumatic Pressure  Medium    Vasopneumatic Temperature   38      Manual Therapy    Manual Therapy  Passive ROM;Soft tissue mobilization    Soft tissue mobilization  mobilization of scar tissue    Passive ROM  knee flex and ext, some contract and relax to help gain flexion               PT Short Term Goals - 04/21/20 1356      PT SHORT TERM GOAL #1   Title  Ind with intial HEP    Time  2    Period  Weeks    Status  Achieved        PT Long Term Goals - 05/08/20 1657      PT LONG TERM GOAL #2   Title  decrease pain 50%    Status  Partially Met      PT LONG TERM GOAL #3   Title  increase left knee aROM to 5-115 degrees flexion    Status  On-going            Plan - 05/08/20 1656    Clinical Impression Statement  Much more relaxed, allows better stretch, still seems to get better flexion with contract relax, c/o less pain with the stretches and all of the exercises.    PT Next Visit Plan  continue to try to push ROM and function    Consulted and Agree with Plan of Care  Patient       Patient will benefit from skilled therapeutic intervention in order to improve the following deficits and impairments:  Pain, Decreased strength, Decreased range of motion, Abnormal gait, Improper body mechanics, Increased muscle spasms, Decreased mobility, Cardiopulmonary status limiting activity, Postural dysfunction, Difficulty walking, Impaired flexibility, Decreased activity tolerance, Decreased balance, Increased edema  Visit Diagnosis: Stiffness of left knee, not elsewhere classified  Acute pain of left knee  Difficulty in walking, not elsewhere classified  Localized edema  Acute bilateral low back pain with left-sided sciatica     Problem List Patient Active Problem List   Diagnosis Date Noted  . Status post left knee replacement 03/14/2020  . S/P left TKA 03/11/2020  . Preoperative clearance 02/22/2019  . Colon polyp   . Lower GI bleed 12/18/2015  . Diverticulosis of colon with hemorrhage   .  Nausea with vomiting 12/02/2014  . Ileus,  postoperative ([place]) 11/04/2014  . Hyponatremia 11/04/2014  . Abdominal fluid collection   . Chest pain 10/26/2014  . Acute calculous cholecystitis 10/26/2014  . Barrett esophagus   . History of GI diverticular bleed   . Essential hypertension   . Dyspnea 01/18/2012  . Aortic insufficiency 01/18/2012  . ANEMIA, IRON DEFICIENCY 06/04/2010  . HYPERCHOLESTEROLEMIA 02/08/2008  . GERD 02/08/2008  . ARTHRITIS 02/08/2008  . HIATAL HERNIA 11/02/2007  . DIVERTICULOSIS OF COLON 04/07/2004    Sumner Boast., PT 05/08/2020, 4:57 PM  [address] [place], [place], [address] Phone: [phone]   Fax:  [phone]  Name: TANIS HENSARLING MRN: [id number] Date of Birth: 1934-11-30

## 2020-05-13 ENCOUNTER — Other Ambulatory Visit: Payer: Self-pay

## 2020-05-13 ENCOUNTER — Ambulatory Visit: Payer: Medicare HMO | Admitting: Physical Therapy

## 2020-05-13 ENCOUNTER — Encounter: Payer: Self-pay | Admitting: Physical Therapy

## 2020-05-13 DIAGNOSIS — M25662 Stiffness of left knee, not elsewhere classified: Secondary | ICD-10-CM | POA: Diagnosis not present

## 2020-05-13 DIAGNOSIS — M5442 Lumbago with sciatica, left side: Secondary | ICD-10-CM | POA: Diagnosis not present

## 2020-05-13 DIAGNOSIS — R600 Localized edema: Secondary | ICD-10-CM | POA: Diagnosis not present

## 2020-05-13 DIAGNOSIS — M25562 Pain in left knee: Secondary | ICD-10-CM

## 2020-05-13 DIAGNOSIS — R262 Difficulty in walking, not elsewhere classified: Secondary | ICD-10-CM | POA: Diagnosis not present

## 2020-05-13 DIAGNOSIS — G8929 Other chronic pain: Secondary | ICD-10-CM | POA: Diagnosis not present

## 2020-05-13 NOTE — Therapy (Signed)
[place], [place], [address] Phone: [phone]   Fax:  [phone]  Physical Therapy Treatment  Patient Details  Name: Terri Shah MRN: [id number] Date of Birth: [birth year] Referring Provider (PT): Cathe Mons Date: 05/13/2020  PT End of Session - 05/13/20 1143    Visit Number  18    Date for PT Re-Evaluation  05/31/20    Authorization Type  [employer]na Medicare    PT Start Time  1104    PT Stop Time  1157    PT Time Calculation (min)  53 min    Activity Tolerance  Patient tolerated treatment well    Behavior During Therapy  [facility] for tasks assessed/performed       Past Medical History:  Diagnosis Date  . Allergy   . Anemia   . Arthritis   . Barrett esophagus   . Barrett's esophagus 11/02/2007   Qualifier: Diagnosis of  By: Mat Carne    . Blood transfusion without reported diagnosis    late 20's or early 30's  . Cataract   . Diverticulosis   . ECTOPIC PREGNANCY 02/08/2008   Qualifier: Diagnosis of  By: Mat Carne    . Family history of adverse reaction to anesthesia    older sister dont remember what it was   . Fibromyalgia    takes elavil  . Gastritis   . GASTRITIS 11/02/2007   Qualifier: Diagnosis of  By: Mat Carne    . GERD (gastroesophageal reflux disease)   . Glaucoma    right eye  . Heart murmur   . Hiatal hernia   . History of GI diverticular bleed   . Hyperlipidemia   . Hypertension   . Iron deficiency 2020  . Iron deficiency anemia   . Osteoporosis   . Palpitations 04/02/2010   Qualifier: Diagnosis of  By: Angelena Form, MD, Harrell Gave    . PONV (postoperative nausea and vomiting)    after c section  . Presbyesophagus   . Stroke [facility])    "small stroke" 2010- no residual per pt    Past Surgical History:  Procedure Laterality Date  . CATARACT EXTRACTION, BILATERAL    . CESAREAN SECTION  1950-1960s   x 3  . CHOLECYSTECTOMY    . COLONOSCOPY  N/A 12/19/2015   Procedure: COLONOSCOPY;  Surgeon: Manus Gunning, MD;  Location: [facility];  Service: Gastroenterology;  Laterality: N/A;  . COLONOSCOPY    . [place]  . FACIAL COSMETIC SURGERY    . LAPAROSCOPIC CHOLECYSTECTOMY SINGLE SITE WITH INTRAOPERATIVE CHOLANGIOGRAM N/A 10/27/2014   Procedure: LAPAROSCOPIC CHOLECYSTECTOMY SINGLE SITE WITH INTRAOPERATIVE CHOLANGIOGRAM;  Surgeon: Nainoa Woldt Boston, MD;  Location: [facility];  Service: General;  Laterality: N/A;  . TOTAL KNEE ARTHROPLASTY Left 03/11/2020   Procedure: TOTAL KNEE ARTHROPLASTY;  Surgeon: Paralee Cancel, MD;  Location: [facility];  Service: Orthopedics;  Laterality: Left;  70 mins  . TUBAL LIGATION    . UPPER GASTROINTESTINAL ENDOSCOPY      There were no vitals filed for this visit.  Subjective Assessment - 05/13/20 1102    Subjective  Patient reports that she has been doing more, able to shop some, reports it was difficult.  She is back to driving some, still some soreness and pain, has been doing the exercises at home to work on the ROM    Currently in Pain?  Yes  Pain Score  2     Pain Location  Knee    Pain Orientation  Left    Pain Descriptors / Indicators  Sore;Tightness    Aggravating Factors   bedning         [facility] PT Assessment - 05/13/20 0001      AROM   Left Knee Flexion  97      PROM   Left Knee Flexion  103                    [facility] Adult PT Treatment/Exercise - 05/13/20 0001      Knee/Hip Exercises: Aerobic   Recumbent Bike  5 minutes, partial revs for 4 minutes then full revs with some hip hike    Nustep  L5 x 6 min      Knee/Hip Exercises: Machines for Strengthening   Cybex Knee Flexion  20# 3x10    Total Gym Leg Press  no weight both legs and one leg really working on flexion down to position 1      Vasopneumatic   Number Minutes Vasopneumatic   10 minutes    Vasopnuematic Location   Knee    Vasopneumatic Pressure  Medium    Vasopneumatic  Temperature   33      Manual Therapy   Manual Therapy  Passive ROM;Soft tissue mobilization    Soft tissue mobilization  mobilization of scar tissue and into the quad    Passive ROM  knee flex and ext, some contract and relax to help gain flexion               PT Short Term Goals - 04/21/20 1356      PT SHORT TERM GOAL #1   Title  Ind with intial HEP    Time  2    Period  Weeks    Status  Achieved        PT Long Term Goals - 05/13/20 1146      PT LONG TERM GOAL #1   Title  independent with RICE    Status  Achieved      PT LONG TERM GOAL #2   Title  decrease pain 50%    Status  Partially Met      PT LONG TERM GOAL #3   Title  increase left knee aROM to 5-115 degrees flexion    Status  On-going      PT LONG TERM GOAL #4   Title  walk with SPC or without device for all household mobility    Status  Partially Met      PT LONG TERM GOAL #5   Title  go up and down stairs step over step    Status  On-going            Plan - 05/13/20 1143    Clinical Impression Statement  Over the past week Terri Shah has been able to relax a lot better and allow PROM.  She does very well with contract relax type stretching.  She is walking much better, using a SPC at all times, reports in the home uses no deivce at times.  She struggled at first with her back pain and with the inablility to really allow PROM, I feel that she is doing much better with her stretches at home now and is pushing herself to the point of discomfort    PT Next Visit Plan  continue to try to push ROM and function, she will  be seeing the MD this week    Consulted and Agree with Plan of Care  Patient       Patient will benefit from skilled therapeutic intervention in order to improve the following deficits and impairments:  Pain, Decreased strength, Decreased range of motion, Abnormal gait, Improper body mechanics, Increased muscle spasms, Decreased mobility, Cardiopulmonary status limiting activity,  Postural dysfunction, Difficulty walking, Impaired flexibility, Decreased activity tolerance, Decreased balance, Increased edema  Visit Diagnosis: Stiffness of left knee, not elsewhere classified  Acute pain of left knee  Difficulty in walking, not elsewhere classified  Localized edema     Problem List Patient Active Problem List   Diagnosis Date Noted  . Status post left knee replacement 03/14/2020  . S/P left TKA 03/11/2020  . Preoperative clearance 02/22/2019  . Colon polyp   . Lower GI bleed 12/18/2015  . Diverticulosis of colon with hemorrhage   . Nausea with vomiting 12/02/2014  . Ileus, postoperative ([place]) 11/04/2014  . Hyponatremia 11/04/2014  . Abdominal fluid collection   . Chest pain 10/26/2014  . Acute calculous cholecystitis 10/26/2014  . Barrett esophagus   . History of GI diverticular bleed   . Essential hypertension   . Dyspnea 01/18/2012  . Aortic insufficiency 01/18/2012  . ANEMIA, IRON DEFICIENCY 06/04/2010  . HYPERCHOLESTEROLEMIA 02/08/2008  . GERD 02/08/2008  . ARTHRITIS 02/08/2008  . HIATAL HERNIA 11/02/2007  . DIVERTICULOSIS OF COLON 04/07/2004    Sumner Boast., PT 05/13/2020, 11:47 AM  [address] [place], [place], [address] Phone: [phone]   Fax:  [phone]  Name: Terri Shah MRN: [id number] Date of Birth: 01/07/1934

## 2020-05-16 ENCOUNTER — Other Ambulatory Visit: Payer: Self-pay

## 2020-05-16 ENCOUNTER — Encounter: Payer: Self-pay | Admitting: Physical Therapy

## 2020-05-16 ENCOUNTER — Ambulatory Visit: Payer: Medicare HMO | Admitting: Physical Therapy

## 2020-05-16 DIAGNOSIS — M25662 Stiffness of left knee, not elsewhere classified: Secondary | ICD-10-CM | POA: Diagnosis not present

## 2020-05-16 DIAGNOSIS — M5442 Lumbago with sciatica, left side: Secondary | ICD-10-CM | POA: Diagnosis not present

## 2020-05-16 DIAGNOSIS — G8929 Other chronic pain: Secondary | ICD-10-CM | POA: Diagnosis not present

## 2020-05-16 DIAGNOSIS — R600 Localized edema: Secondary | ICD-10-CM | POA: Diagnosis not present

## 2020-05-16 DIAGNOSIS — M25562 Pain in left knee: Secondary | ICD-10-CM | POA: Diagnosis not present

## 2020-05-16 DIAGNOSIS — R262 Difficulty in walking, not elsewhere classified: Secondary | ICD-10-CM | POA: Diagnosis not present

## 2020-05-16 NOTE — Therapy (Signed)
[place] [address] [place], [place], [address] Phone: [phone]   Fax:  [phone]  Physical Therapy Treatment  Patient Details  Name: Terri Shah MRN: [id number] Date of Birth: 1934-11-29 Referring Provider (PT): Cathe Mons Date: 05/16/2020  PT End of Session - 05/16/20 1032    Visit Number  19    Date for PT Re-Evaluation  05/31/20    Authorization Type  Aetna Medicare    PT Start Time  0932    PT Stop Time  1025    PT Time Calculation (min)  53 min    Activity Tolerance  Patient tolerated treatment well    Behavior During Therapy  [facility] for tasks assessed/performed       Past Medical History:  Diagnosis Date  . Allergy   . Anemia   . Arthritis   . Barrett esophagus   . Barrett's esophagus 11/02/2007   Qualifier: Diagnosis of  By: Mat Carne    . Blood transfusion without reported diagnosis    late 20's or early 30's  . Cataract   . Diverticulosis   . ECTOPIC PREGNANCY 02/08/2008   Qualifier: Diagnosis of  By: Mat Carne    . Family history of adverse reaction to anesthesia    older sister dont remember what it was   . Fibromyalgia    takes elavil  . Gastritis   . GASTRITIS 11/02/2007   Qualifier: Diagnosis of  By: Mat Carne    . GERD (gastroesophageal reflux disease)   . Glaucoma    right eye  . Heart murmur   . Hiatal hernia   . History of GI diverticular bleed   . Hyperlipidemia   . Hypertension   . Iron deficiency 2020  . Iron deficiency anemia   . Osteoporosis   . Palpitations 04/02/2010   Qualifier: Diagnosis of  By: Angelena Form, MD, Harrell Gave    . PONV (postoperative nausea and vomiting)    after c section  . Presbyesophagus   . Stroke [facility])    "small stroke" 2010- no residual per pt    Past Surgical History:  Procedure Laterality Date  . CATARACT EXTRACTION, BILATERAL    . CESAREAN SECTION  1950-1960s   x 3  . CHOLECYSTECTOMY    . COLONOSCOPY  N/A 12/19/2015   Procedure: COLONOSCOPY;  Surgeon: Manus Gunning, MD;  Location: [facility];  Service: Gastroenterology;  Laterality: N/A;  . COLONOSCOPY    . [place]  . FACIAL COSMETIC SURGERY    . LAPAROSCOPIC CHOLECYSTECTOMY SINGLE SITE WITH INTRAOPERATIVE CHOLANGIOGRAM N/A 10/27/2014   Procedure: LAPAROSCOPIC CHOLECYSTECTOMY SINGLE SITE WITH INTRAOPERATIVE CHOLANGIOGRAM;  Surgeon: Suheyb Raucci Boston, MD;  [facility];  Service: General;  Laterality: N/A;  . TOTAL KNEE ARTHROPLASTY Left 03/11/2020   Procedure: TOTAL KNEE ARTHROPLASTY;  Surgeon: Paralee Cancel, MD;  [facility];  Service: Orthopedics;  Laterality: Left;  70 mins  . TUBAL LIGATION    . UPPER GASTROINTESTINAL ENDOSCOPY      There were no vitals filed for this visit.  Subjective Assessment - 05/16/20 0938    Subjective  [name] saw the MD, she reports that he was very pleased with the progress she has made, wants her to continue with PT, she reports that she slept great last night for the first time    Currently in Pain?  Yes    Pain Score  2  Pain Location  Knee    Pain Orientation  Left    Pain Descriptors / Indicators  Sore;Tightness                        [facility] Adult PT Treatment/Exercise - 05/16/20 0001      Ambulation/Gait   Gait Comments  gait without device working on step length and speed      High Level Balance   High Level Balance Activities  Tandem walking;Side stepping;Backward walking;Negotiating over obstacles    High Level Balance Comments  on airex ball toss      Knee/Hip Exercises: Aerobic   Recumbent Bike  5 minutes, partial revs for 4 minutes then full revs with some hip hike    Nustep  L5 x 6 min      Knee/Hip Exercises: Machines for Strengthening   Cybex Knee Extension  5# 3x10 with stretch between sets with PT overpressure    Cybex Knee Flexion  20# 3x10    Total Gym Leg Press  no weight both legs and one leg really working on  flexion down to position 1      Vasopneumatic   Number Minutes Vasopneumatic   10 minutes    Vasopnuematic Location   Knee    Vasopneumatic Pressure  Medium    Vasopneumatic Temperature   34      Manual Therapy   Manual Therapy  Passive ROM;Soft tissue mobilization    Soft tissue mobilization  mobilization of scar tissue and into the quad    Passive ROM  knee flex and ext, some contract and relax to help gain flexion               PT Short Term Goals - 04/21/20 1356      PT SHORT TERM GOAL #1   Title  Ind with intial HEP    Time  2    Period  Weeks    Status  Achieved        PT Long Term Goals - 05/13/20 1146      PT LONG TERM GOAL #1   Title  independent with RICE    Status  Achieved      PT LONG TERM GOAL #2   Title  decrease pain 50%    Status  Partially Met      PT LONG TERM GOAL #3   Title  increase left knee aROM to 5-115 degrees flexion    Status  On-going      PT LONG TERM GOAL #4   Title  walk with SPC or without device for all household mobility    Status  Partially Met      PT LONG TERM GOAL #5   Title  go up and down stairs step over step    Status  On-going            Plan - 05/16/20 1032    Clinical Impression Statement  Patient doing very well, MD pleased, less limp with walking, some timidity with balance aspects, she is fearful and needs light HHA for this.  Swelling is improved, still very tight into flexion    PT Next Visit Plan  push ROM and function    Consulted and Agree with Plan of Care  Patient       Patient will benefit from skilled therapeutic intervention in order to improve the following deficits and impairments:  Pain, Decreased strength, Decreased range of motion, Abnormal gait,  Improper body mechanics, Increased muscle spasms, Decreased mobility, Cardiopulmonary status limiting activity, Postural dysfunction, Difficulty walking, Impaired flexibility, Decreased activity tolerance, Decreased balance, Increased  edema  Visit Diagnosis: Stiffness of left knee, not elsewhere classified  Acute pain of left knee  Difficulty in walking, not elsewhere classified  Localized edema     Problem List Patient Active Problem List   Diagnosis Date Noted  . Status post left knee replacement 03/14/2020  . S/P left TKA 03/11/2020  . Preoperative clearance 02/22/2019  . Colon polyp   . Lower GI bleed 12/18/2015  . Diverticulosis of colon with hemorrhage   . Nausea with vomiting 12/02/2014  . Ileus, postoperative ([place]) 11/04/2014  . Hyponatremia 11/04/2014  . Abdominal fluid collection   . Chest pain 10/26/2014  . Acute calculous cholecystitis 10/26/2014  . Barrett esophagus   . History of GI diverticular bleed   . Essential hypertension   . Dyspnea 01/18/2012  . Aortic insufficiency 01/18/2012  . ANEMIA, IRON DEFICIENCY 06/04/2010  . HYPERCHOLESTEROLEMIA 02/08/2008  . GERD 02/08/2008  . ARTHRITIS 02/08/2008  . HIATAL HERNIA 11/02/2007  . DIVERTICULOSIS OF COLON 04/07/2004    Sumner Boast., PT 05/16/2020, 10:34 AM  [address] [place], [place], [address] Phone: [phone]   Fax:  [phone]  Name: Terri Shah MRN: [id number] Date of Birth: 1934-03-11

## 2020-05-20 ENCOUNTER — Other Ambulatory Visit: Payer: Self-pay

## 2020-05-20 ENCOUNTER — Ambulatory Visit: Payer: Medicare HMO | Attending: Physical Medicine and Rehabilitation | Admitting: Physical Therapy

## 2020-05-20 ENCOUNTER — Encounter: Payer: Self-pay | Admitting: Physical Therapy

## 2020-05-20 DIAGNOSIS — R262 Difficulty in walking, not elsewhere classified: Secondary | ICD-10-CM | POA: Diagnosis not present

## 2020-05-20 DIAGNOSIS — M25662 Stiffness of left knee, not elsewhere classified: Secondary | ICD-10-CM | POA: Diagnosis not present

## 2020-05-20 DIAGNOSIS — M5442 Lumbago with sciatica, left side: Secondary | ICD-10-CM | POA: Diagnosis not present

## 2020-05-20 DIAGNOSIS — M25562 Pain in left knee: Secondary | ICD-10-CM | POA: Diagnosis not present

## 2020-05-20 DIAGNOSIS — R600 Localized edema: Secondary | ICD-10-CM

## 2020-05-20 NOTE — Therapy (Signed)
[place], [place], [address] Phone: [phone]   Fax:  [phone]  Physical Therapy Treatment  Patient Details  Name: Terri Shah MRN: [id number] Date of Birth: 12-17-1934 Referring Provider (PT): Cathe Mons Date: 05/20/2020  PT End of Session - 05/20/20 1340    Visit Number  20    Date for PT Re-Evaluation  05/31/20    Authorization Type  [employer] Medicare    PT Start Time  8003    PT Stop Time  1400    PT Time Calculation (min)  55 min    Activity Tolerance  Patient tolerated treatment well    Behavior During Therapy  [facility] for tasks assessed/performed       Past Medical History:  Diagnosis Date  . Allergy   . Anemia   . Arthritis   . Barrett esophagus   . Barrett's esophagus 11/02/2007   Qualifier: Diagnosis of  By: Mat Carne    . Blood transfusion without reported diagnosis    late 20's or early 30's  . Cataract   . Diverticulosis   . ECTOPIC PREGNANCY 02/08/2008   Qualifier: Diagnosis of  By: Mat Carne    . Family history of adverse reaction to anesthesia    older sister dont remember what it was   . Fibromyalgia    takes elavil  . Gastritis   . GASTRITIS 11/02/2007   Qualifier: Diagnosis of  By: Mat Carne    . GERD (gastroesophageal reflux disease)   . Glaucoma    right eye  . Heart murmur   . Hiatal hernia   . History of GI diverticular bleed   . Hyperlipidemia   . Hypertension   . Iron deficiency 2020  . Iron deficiency anemia   . Osteoporosis   . Palpitations 04/02/2010   Qualifier: Diagnosis of  By: Angelena Form, MD, Harrell Gave    . PONV (postoperative nausea and vomiting)    after c section  . Presbyesophagus   . Stroke [facility])    "small stroke" 2010- no residual per pt    Past Surgical History:  Procedure Laterality Date  . CATARACT EXTRACTION, BILATERAL    . CESAREAN SECTION  1950-1960s   x 3  . CHOLECYSTECTOMY    . COLONOSCOPY N/A  12/19/2015   Procedure: COLONOSCOPY;  Surgeon: Manus Gunning, MD;  Location: [facility];  Service: Gastroenterology;  Laterality: N/A;  . COLONOSCOPY    . [place]  . FACIAL COSMETIC SURGERY    . LAPAROSCOPIC CHOLECYSTECTOMY SINGLE SITE WITH INTRAOPERATIVE CHOLANGIOGRAM N/A 10/27/2014   Procedure: LAPAROSCOPIC CHOLECYSTECTOMY SINGLE SITE WITH INTRAOPERATIVE CHOLANGIOGRAM;  Surgeon: Schylar Wuebker Boston, MD;  Location: [facility];  Service: General;  Laterality: N/A;  . TOTAL KNEE ARTHROPLASTY Left 03/11/2020   Procedure: TOTAL KNEE ARTHROPLASTY;  Surgeon: Paralee Cancel, MD;  Location: [facility];  Service: Orthopedics;  Laterality: Left;  70 mins  . TUBAL LIGATION    . UPPER GASTROINTESTINAL ENDOSCOPY      There were no vitals filed for this visit.  Subjective Assessment - 05/20/20 1310    Subjective  Patient reports that she is doing okay, less pain overall, still hurting at night    Currently in Pain?  Yes    Pain Score  3     Pain Location  Knee    Pain Orientation  Left    Aggravating Factors  night, bending                        [facility] Adult PT Treatment/Exercise - 05/20/20 0001      Ambulation/Gait   Gait Comments  gait without device working on step length and speed, did a flight of stairs step over step with hand rail and HHA      Knee/Hip Exercises: Aerobic   Recumbent Bike  5 minutes, a mix of partial and full revolutions      Knee/Hip Exercises: Machines for Strengthening   Cybex Knee Extension  5# 3x10 with stretch between sets with PT overpressure    Cybex Knee Flexion  20# 3x10    Total Gym Leg Press  no weight both legs and one leg really working on flexion down to position 1      Vasopneumatic   Number Minutes Vasopneumatic   10 minutes    Vasopnuematic Location   Knee    Vasopneumatic Pressure  Medium    Vasopneumatic Temperature   34      Manual Therapy   Manual Therapy  Passive ROM;Soft tissue mobilization    Soft  tissue mobilization  mobilization of scar tissue and into the quad    Passive ROM  knee flex and ext, some contract and relax to help gain flexion               PT Short Term Goals - 04/21/20 1356      PT SHORT TERM GOAL #1   Title  Ind with intial HEP    Time  2    Period  Weeks    Status  Achieved        PT Long Term Goals - 05/13/20 1146      PT LONG TERM GOAL #1   Title  independent with RICE    Status  Achieved      PT LONG TERM GOAL #2   Title  decrease pain 50%    Status  Partially Met      PT LONG TERM GOAL #3   Title  increase left knee aROM to 5-115 degrees flexion    Status  On-going      PT LONG TERM GOAL #4   Title  walk with SPC or without device for all household mobility    Status  Partially Met      PT LONG TERM GOAL #5   Title  go up and down stairs step over step    Status  On-going            Plan - 05/20/20 1340    Clinical Impression Statement  Patient having a little more left hip and low back pain, she is very tight and tender in this area, tried a little STM to this area.  She and I were surprised that she could do the stairs step over step going down.  She is very tight and sensitive to pain    PT Next Visit Plan  push ROM and function    Consulted and Agree with Plan of Care  Patient       Patient will benefit from skilled therapeutic intervention in order to improve the following deficits and impairments:  Pain, Decreased strength, Decreased range of motion, Abnormal gait, Improper body mechanics, Increased muscle spasms, Decreased mobility, Cardiopulmonary status limiting activity, Postural dysfunction, Difficulty walking, Impaired flexibility, Decreased activity tolerance, Decreased balance, Increased edema  Visit Diagnosis: Stiffness of left knee,  not elsewhere classified  Acute pain of left knee  Difficulty in walking, not elsewhere classified  Localized edema  Acute bilateral low back pain with left-sided  sciatica     Problem List Patient Active Problem List   Diagnosis Date Noted  . Status post left knee replacement 03/14/2020  . S/P left TKA 03/11/2020  . Preoperative clearance 02/22/2019  . Colon polyp   . Lower GI bleed 12/18/2015  . Diverticulosis of colon with hemorrhage   . Nausea with vomiting 12/02/2014  . Ileus, postoperative ([place]) 11/04/2014  . Hyponatremia 11/04/2014  . Abdominal fluid collection   . Chest pain 10/26/2014  . Acute calculous cholecystitis 10/26/2014  . Barrett esophagus   . History of GI diverticular bleed   . Essential hypertension   . Dyspnea 01/18/2012  . Aortic insufficiency 01/18/2012  . ANEMIA, IRON DEFICIENCY 06/04/2010  . HYPERCHOLESTEROLEMIA 02/08/2008  . GERD 02/08/2008  . ARTHRITIS 02/08/2008  . HIATAL HERNIA 11/02/2007  . DIVERTICULOSIS OF COLON 04/07/2004    Sumner Boast., PT 05/20/2020, 1:42 PM  [place] [address] [place], [place], [address] Phone: [phone]   Fax:  [phone]  Name: Terri Shah MRN: [id number] Date of Birth: [birth year]

## 2020-05-22 ENCOUNTER — Encounter: Payer: Self-pay | Admitting: Physical Therapy

## 2020-05-22 ENCOUNTER — Other Ambulatory Visit: Payer: Self-pay

## 2020-05-22 ENCOUNTER — Ambulatory Visit: Payer: Medicare HMO | Admitting: Physical Therapy

## 2020-05-22 DIAGNOSIS — R262 Difficulty in walking, not elsewhere classified: Secondary | ICD-10-CM

## 2020-05-22 DIAGNOSIS — M25662 Stiffness of left knee, not elsewhere classified: Secondary | ICD-10-CM

## 2020-05-22 DIAGNOSIS — M5442 Lumbago with sciatica, left side: Secondary | ICD-10-CM

## 2020-05-22 DIAGNOSIS — R600 Localized edema: Secondary | ICD-10-CM

## 2020-05-22 DIAGNOSIS — M25562 Pain in left knee: Secondary | ICD-10-CM

## 2020-05-22 NOTE — Therapy (Signed)
[place], [place], [address] Phone: [phone]   Fax:  [phone]  Physical Therapy Treatment  Patient Details  Name: Terri Shah MRN: [id number] Date of Birth: [birth year] Referring Provider (PT): Cathe Mons Date: 05/22/2020  PT End of Session - 05/22/20 1358    Visit Number  21    Date for PT Re-Evaluation  05/31/20    Authorization Type  A[employer]na Medicare    PT Start Time  1315    PT Stop Time  1410    PT Time Calculation (min)  55 min    Activity Tolerance  Patient tolerated treatment well    Behavior During Therapy  [facility] for tasks assessed/performed       Past Medical History:  Diagnosis Date  . Allergy   . Anemia   . Arthritis   . Barrett esophagus   . Barrett's esophagus 11/02/2007   Qualifier: Diagnosis of  By: Mat Carne    . Blood transfusion without reported diagnosis    late 20's or early 30's  . Cataract   . Diverticulosis   . ECTOPIC PREGNANCY 02/08/2008   Qualifier: Diagnosis of  By: Mat Carne    . Family history of adverse reaction to anesthesia    older sister dont remember what it was   . Fibromyalgia    takes elavil  . Gastritis   . GASTRITIS 11/02/2007   Qualifier: Diagnosis of  By: Mat Carne    . GERD (gastroesophageal reflux disease)   . Glaucoma    right eye  . Heart murmur   . Hiatal hernia   . History of GI diverticular bleed   . Hyperlipidemia   . Hypertension   . Iron deficiency 2020  . Iron deficiency anemia   . Osteoporosis   . Palpitations 04/02/2010   Qualifier: Diagnosis of  By: Angelena Form, MD, Harrell Gave    . PONV (postoperative nausea and vomiting)    after c section  . Presbyesophagus   . Stroke [facility])    "small stroke" 2010- no residual per pt    Past Surgical History:  Procedure Laterality Date  . CATARACT EXTRACTION, BILATERAL    . CESAREAN SECTION  1950-1960s   x 3  . CHOLECYSTECTOMY    . COLONOSCOPY N/A  12/19/2015   Procedure: COLONOSCOPY;  Surgeon: Manus Gunning, MD;  Location: [facility];  Service: Gastroenterology;  Laterality: N/A;  . COLONOSCOPY    . [place]  . FACIAL COSMETIC SURGERY    . LAPAROSCOPIC CHOLECYSTECTOMY SINGLE SITE WITH INTRAOPERATIVE CHOLANGIOGRAM N/A 10/27/2014   Procedure: LAPAROSCOPIC CHOLECYSTECTOMY SINGLE SITE WITH INTRAOPERATIVE CHOLANGIOGRAM;  Surgeon: Michael Boston, MD;  [facility]: [facility];  Service: General;  Laterality: N/A;  . TOTAL KNEE ARTHROPLASTY Left 03/11/2020   Procedure: TOTAL KNEE ARTHROPLASTY;  Surgeon: Paralee Cancel, MD;  Location: [facility];  Service: Orthopedics;  Laterality: Left;  70 mins  . TUBAL LIGATION    . UPPER GASTROINTESTINAL ENDOSCOPY      There were no vitals filed for this visit.  Subjective Assessment - 05/22/20 1322    Subjective  Pt reports tired today from lack of sleep last night but that knee is feeling pretty good    Currently in Pain?  Yes    Pain Score  2     Pain Location  Knee    Pain Orientation  Left  [place] Adult PT Treatment/Exercise - 05/22/20 0001      Knee/Hip Exercises: Aerobic   Nustep  L5 x 8mn      Knee/Hip Exercises: Machines for Strengthening   Cybex Knee Extension  5# 3x10 with stretch between sets with PT overpressure    Cybex Knee Flexion  20# 3x10    Total Gym Leg Press  no weight both legs and one leg really working on flexion down to position 1      Knee/Hip Exercises: Standing   Forward Step Up  Both;2 sets;10 reps;Hand Hold: 1;Step Height: 6"    Step Down  Left;2 sets;10 reps;Hand Hold: 1;Step Height: 4"    Step Down Limitations  some difficulty with eccentric control of LLE      Vasopneumatic   Number Minutes Vasopneumatic   15 minutes    Vasopnuematic Location   Knee    Vasopneumatic Pressure  Medium    Vasopneumatic Temperature   34      Manual Therapy   Manual Therapy  Passive ROM    Passive ROM  knee flex  and ext, some contract and relax to help gain flexion               PT Short Term Goals - 04/21/20 1356      PT SHORT TERM GOAL #1   Title  Ind with intial HEP    Time  2    Period  Weeks    Status  Achieved        PT Long Term Goals - 05/13/20 1146      PT LONG TERM GOAL #1   Title  independent with RICE    Status  Achieved      PT LONG TERM GOAL #2   Title  decrease pain 50%    Status  Partially Met      PT LONG TERM GOAL #3   Title  increase left knee aROM to 5-115 degrees flexion    Status  On-going      PT LONG TERM GOAL #4   Title  walk with SPC or without device for all household mobility    Status  Partially Met      PT LONG TERM GOAL #5   Title  go up and down stairs step over step    Status  On-going            Plan - 05/22/20 1358    Clinical Impression Statement  Pt did well with step ups and step down ex's today; demonstrated some eccentric weakness of LLE with step downs. Continue to progress ROM; knee flexion still difficult/painful.    PT Treatment/Interventions  ADLs/Self Care Home Management;Cryotherapy;Electrical Stimulation;Moist Heat;Ultrasound;Therapeutic activities;Therapeutic exercise;Neuromuscular re-education;Manual techniques;Patient/family education;Vasopneumatic Device;Functional mobility training;Stair training;Gait training    PT Next Visit Plan  push ROM and function    Consulted and Agree with Plan of Care  Patient       Patient will benefit from skilled therapeutic intervention in order to improve the following deficits and impairments:  Pain, Decreased strength, Decreased range of motion, Abnormal gait, Improper body mechanics, Increased muscle spasms, Decreased mobility, Cardiopulmonary status limiting activity, Postural dysfunction, Difficulty walking, Impaired flexibility, Decreased activity tolerance, Decreased balance, Increased edema  Visit Diagnosis: Stiffness of left knee, not elsewhere classified  Acute pain  of left knee  Difficulty in walking, not elsewhere classified  Localized edema  Acute bilateral low back pain with left-sided sciatica     Problem List Patient Active  Problem List   Diagnosis Date Noted  . Status post left knee replacement 03/14/2020  . S/P left TKA 03/11/2020  . Preoperative clearance 02/22/2019  . Colon polyp   . Lower GI bleed 12/18/2015  . Diverticulosis of colon with hemorrhage   . Nausea with vomiting 12/02/2014  . Ileus, postoperative ([place]) 11/04/2014  . Hyponatremia 11/04/2014  . Abdominal fluid collection   . Chest pain 10/26/2014  . Acute calculous cholecystitis 10/26/2014  . Barrett esophagus   . History of GI diverticular bleed   . Essential hypertension   . Dyspnea 01/18/2012  . Aortic insufficiency 01/18/2012  . ANEMIA, IRON DEFICIENCY 06/04/2010  . HYPERCHOLESTEROLEMIA 02/08/2008  . GERD 02/08/2008  . ARTHRITIS 02/08/2008  . HIATAL HERNIA 11/02/2007  . DIVERTICULOSIS OF COLON 04/07/2004   Amador Cunas, PT, DPT Donald Prose Ovide Dusek 05/22/2020, 2:00 PM  [place], [place], [address] Phone: [phone]   Fax:  [phone]  Name: Terri Shah MRN: [id number] Date of Birth: October 01, 1934

## 2020-05-27 ENCOUNTER — Ambulatory Visit: Payer: Medicare HMO | Admitting: Physical Therapy

## 2020-05-27 ENCOUNTER — Encounter: Payer: Self-pay | Admitting: Physical Therapy

## 2020-05-27 ENCOUNTER — Other Ambulatory Visit: Payer: Self-pay

## 2020-05-27 DIAGNOSIS — M25662 Stiffness of left knee, not elsewhere classified: Secondary | ICD-10-CM

## 2020-05-27 DIAGNOSIS — M25562 Pain in left knee: Secondary | ICD-10-CM

## 2020-05-27 DIAGNOSIS — M5442 Lumbago with sciatica, left side: Secondary | ICD-10-CM | POA: Diagnosis not present

## 2020-05-27 DIAGNOSIS — R600 Localized edema: Secondary | ICD-10-CM

## 2020-05-27 DIAGNOSIS — R262 Difficulty in walking, not elsewhere classified: Secondary | ICD-10-CM | POA: Diagnosis not present

## 2020-05-27 NOTE — Therapy (Signed)
[place], [place], [address] Phone: [phone]   Fax:  [phone]  Physical Therapy Treatment  Patient Details  Name: Terri Shah MRN: [id number] Date of Birth: Apr 28, 1934 Referring Provider (PT): Cathe Mons Date: 05/27/2020  PT End of Session - 05/27/20 1418    Visit Number  22    Date for PT Re-Evaluation  05/31/20    Authorization Type  Aetna Medicare    PT Start Time  1315    PT Stop Time  1415    PT Time Calculation (min)  60 min    Activity Tolerance  Patient tolerated treatment well    Behavior During Therapy  [facility] for tasks assessed/performed       Past Medical History:  Diagnosis Date  . Allergy   . Anemia   . Arthritis   . Barrett esophagus   . Barrett's esophagus 11/02/2007   Qualifier: Diagnosis of  By: Mat Carne    . Blood transfusion without reported diagnosis    late 20's or early 30's  . Cataract   . Diverticulosis   . ECTOPIC PREGNANCY 02/08/2008   Qualifier: Diagnosis of  By: Mat Carne    . Family history of adverse reaction to anesthesia    older sister dont remember what it was   . Fibromyalgia    takes elavil  . Gastritis   . GASTRITIS 11/02/2007   Qualifier: Diagnosis of  By: Mat Carne    . GERD (gastroesophageal reflux disease)   . Glaucoma    right eye  . Heart murmur   . Hiatal hernia   . History of GI diverticular bleed   . Hyperlipidemia   . Hypertension   . Iron deficiency 2020  . Iron deficiency anemia   . Osteoporosis   . Palpitations 04/02/2010   Qualifier: Diagnosis of  By: Angelena Form, MD, Harrell Gave    . PONV (postoperative nausea and vomiting)    after c section  . Presbyesophagus   . Stroke [facility])    "small stroke" 2010- no residual per pt    Past Surgical History:  Procedure Laterality Date  . CATARACT EXTRACTION, BILATERAL    . CESAREAN SECTION  1950-1960s   x 3  . CHOLECYSTECTOMY    . COLONOSCOPY N/A  12/19/2015   Procedure: COLONOSCOPY;  Surgeon: Manus Gunning, MD;  Location: [facility];  Service: Gastroenterology;  Laterality: N/A;  . COLONOSCOPY    . [place]  . FACIAL COSMETIC SURGERY    . LAPAROSCOPIC CHOLECYSTECTOMY SINGLE SITE WITH INTRAOPERATIVE CHOLANGIOGRAM N/A 10/27/2014   Procedure: LAPAROSCOPIC CHOLECYSTECTOMY SINGLE SITE WITH INTRAOPERATIVE CHOLANGIOGRAM;  Surgeon: Michael Boston, MD;  [facility];  Service: General;  Laterality: N/A;  . TOTAL KNEE ARTHROPLASTY Left 03/11/2020   Procedure: TOTAL KNEE ARTHROPLASTY;  Surgeon: Paralee Cancel, MD;  [facility];  Service: Orthopedics;  Laterality: Left;  70 mins  . TUBAL LIGATION    . UPPER GASTROINTESTINAL ENDOSCOPY      There were no vitals filed for this visit.  Subjective Assessment - 05/27/20 1322    Subjective  Pt reports she is feeling pretty good and doesn't often need the cane anymore    Currently in Pain?  Yes    Pain Score  3     Pain Location  Knee    Pain Orientation  Left  [place] Adult PT Treatment/Exercise - 05/27/20 0001      Lumbar Exercises: Stretches   Gastroc Stretch  Left;1 rep;30 seconds    Gastroc Stretch Limitations  soleus stretch LLE 30 sec      Knee/Hip Exercises: Aerobic   Recumbent Bike  6 min      Knee/Hip Exercises: Machines for Strengthening   Cybex Knee Extension  5# 2x15    Cybex Knee Flexion  20# 2x15    Cybex Leg Press  20# BLE 2x10    Total Gym Leg Press  no weight both legs down to 1      Knee/Hip Exercises: Standing   Heel Raises  10 reps;2 sets    Forward Step Up  Both;2 sets;10 reps;Hand Hold: 1;Step Height: 6"    Step Down  Left;2 sets;10 reps;Hand Hold: 1;Step Height: 4"    Step Down Limitations  some difficulty with eccentric control of LLE      Vasopneumatic   Number Minutes Vasopneumatic   15 minutes    Vasopnuematic Location   Knee    Vasopneumatic Pressure  Medium    Vasopneumatic  Temperature   34      Manual Therapy   Manual Therapy  Passive ROM    Passive ROM  knee flex and ext               PT Short Term Goals - 04/21/20 1356      PT SHORT TERM GOAL #1   Title  Ind with intial HEP    Time  2    Period  Weeks    Status  Achieved        PT Long Term Goals - 05/13/20 1146      PT LONG TERM GOAL #1   Title  independent with RICE    Status  Achieved      PT LONG TERM GOAL #2   Title  decrease pain 50%    Status  Partially Met      PT LONG TERM GOAL #3   Title  increase left knee aROM to 5-115 degrees flexion    Status  On-going      PT LONG TERM GOAL #4   Title  walk with SPC or without device for all household mobility    Status  Partially Met      PT LONG TERM GOAL #5   Title  go up and down stairs step over step    Status  On-going            Plan - 05/27/20 1418    Clinical Impression Statement  Pt did well with ex's today; some eccentric weakness of LLE with steps. Knee flexion still difficult and painful but progressing. Pt reports relief with vasopneumatic.    PT Treatment/Interventions  ADLs/Self Care Home Management;Cryotherapy;Electrical Stimulation;Moist Heat;Ultrasound;Therapeutic activities;Therapeutic exercise;Neuromuscular re-education;Manual techniques;Patient/family education;Vasopneumatic Device;Functional mobility training;Stair training;Gait training    PT Next Visit Plan  push ROM and function    Consulted and Agree with Plan of Care  Patient       Patient will benefit from skilled therapeutic intervention in order to improve the following deficits and impairments:  Pain, Decreased strength, Decreased range of motion, Abnormal gait, Improper body mechanics, Increased muscle spasms, Decreased mobility, Cardiopulmonary status limiting activity, Postural dysfunction, Difficulty walking, Impaired flexibility, Decreased activity tolerance, Decreased balance, Increased edema  Visit Diagnosis: Stiffness of left  knee, not elsewhere classified  Acute pain of left knee  Difficulty in walking, not  elsewhere classified  Localized edema  Acute bilateral low back pain with left-sided sciatica     Problem List Patient Active Problem List   Diagnosis Date Noted  . Status post left knee replacement 03/14/2020  . S/P left TKA 03/11/2020  . Preoperative clearance 02/22/2019  . Colon polyp   . Lower GI bleed 12/18/2015  . Diverticulosis of colon with hemorrhage   . Nausea with vomiting 12/02/2014  . Ileus, postoperative ([place]) 11/04/2014  . Hyponatremia 11/04/2014  . Abdominal fluid collection   . Chest pain 10/26/2014  . Acute calculous cholecystitis 10/26/2014  . Barrett esophagus   . History of GI diverticular bleed   . Essential hypertension   . Dyspnea 01/18/2012  . Aortic insufficiency 01/18/2012  . ANEMIA, IRON DEFICIENCY 06/04/2010  . HYPERCHOLESTEROLEMIA 02/08/2008  . GERD 02/08/2008  . ARTHRITIS 02/08/2008  . HIATAL HERNIA 11/02/2007  . DIVERTICULOSIS OF COLON 04/07/2004   Amador Cunas, PT, DPT Donald Prose Mikolaj [place]ulme 05/27/2020, 2:24 PM  [place] [address] [address] [place], [place], [address] Phone: [phone]   Fax:  [phone]  Name: QUANDA PAVLICEK MRN: [id number] Date of Birth: 04/05/1934

## 2020-05-29 ENCOUNTER — Ambulatory Visit: Payer: Medicare HMO | Admitting: Physical Therapy

## 2020-05-29 ENCOUNTER — Other Ambulatory Visit: Payer: Self-pay

## 2020-05-29 ENCOUNTER — Encounter: Payer: Self-pay | Admitting: Physical Therapy

## 2020-05-29 DIAGNOSIS — R600 Localized edema: Secondary | ICD-10-CM

## 2020-05-29 DIAGNOSIS — M25662 Stiffness of left knee, not elsewhere classified: Secondary | ICD-10-CM | POA: Diagnosis not present

## 2020-05-29 DIAGNOSIS — M25562 Pain in left knee: Secondary | ICD-10-CM | POA: Diagnosis not present

## 2020-05-29 DIAGNOSIS — R262 Difficulty in walking, not elsewhere classified: Secondary | ICD-10-CM | POA: Diagnosis not present

## 2020-05-29 DIAGNOSIS — M5442 Lumbago with sciatica, left side: Secondary | ICD-10-CM | POA: Diagnosis not present

## 2020-05-29 NOTE — Therapy (Signed)
[address] [place], [place], [address] Phone: [phone]   Fax:  [phone]  Physical Therapy Treatment  Patient Details  Name: Terri Shah MRN: [id number] Date of Birth: [birth year] Referring Provider (PT): Cathe Mons Date: 05/29/2020   PT End of Session - 05/29/20 1351    Visit Number 23    Date for PT Re-Evaluation 05/31/20    Authorization Type [employer] Medicare    PT Start Time 0165    PT Stop Time 1411    PT Time Calculation (min) 58 min    Activity Tolerance Patient tolerated treatment well           Past Medical History:  Diagnosis Date  . Allergy   . Anemia   . Arthritis   . Barrett esophagus   . Barrett's esophagus 11/02/2007   Qualifier: Diagnosis of  By: Mat Carne    . Blood transfusion without reported diagnosis    late 20's or early 30's  . Cataract   . Diverticulosis   . ECTOPIC PREGNANCY 02/08/2008   Qualifier: Diagnosis of  By: Mat Carne    . Family history of adverse reaction to anesthesia    older sister dont remember what it was   . Fibromyalgia    takes elavil  . Gastritis   . GASTRITIS 11/02/2007   Qualifier: Diagnosis of  By: Mat Carne    . GERD (gastroesophageal reflux disease)   . Glaucoma    right eye  . Heart murmur   . Hiatal hernia   . History of GI diverticular bleed   . Hyperlipidemia   . Hypertension   . Iron deficiency 2020  . Iron deficiency anemia   . Osteoporosis   . Palpitations 04/02/2010   Qualifier: Diagnosis of  By: Angelena Form, MD, Harrell Gave    . PONV (postoperative nausea and vomiting)    after c section  . Presbyesophagus   . Stroke [facility])    "small stroke" 2010- no residual per pt    Past Surgical History:  Procedure Laterality Date  . CATARACT EXTRACTION, BILATERAL    . CESAREAN SECTION  1950-1960s   x 3  . CHOLECYSTECTOMY    . COLONOSCOPY N/A 12/19/2015   Procedure: COLONOSCOPY;  Surgeon: Manus Gunning, MD;  Location: [facility];  Service: Gastroenterology;  Laterality: N/A;  . COLONOSCOPY    . [place]  . FACIAL COSMETIC SURGERY    . LAPAROSCOPIC CHOLECYSTECTOMY SINGLE SITE WITH INTRAOPERATIVE CHOLANGIOGRAM N/A 10/27/2014   Procedure: LAPAROSCOPIC CHOLECYSTECTOMY SINGLE SITE WITH INTRAOPERATIVE CHOLANGIOGRAM;  Surgeon: Jenice Leiner Boston, MD;  Location: [facility];  Service: General;  Laterality: N/A;  . TOTAL KNEE ARTHROPLASTY Left 03/11/2020   Procedure: TOTAL KNEE ARTHROPLASTY;  Surgeon: Paralee Cancel, MD;  Location: [facility];  Service: Orthopedics;  Laterality: Left;  70 mins  . TUBAL LIGATION    . UPPER GASTROINTESTINAL ENDOSCOPY      There were no vitals filed for this visit.   Subjective Assessment - 05/29/20 1319    Subjective [name] reports that she did 2 loads of laundry today, feels tired and starting to hurt, she reports that she took a pain pill    Currently in Pain? Yes    Pain Score 4     Pain Location Knee    Pain Orientation Left    Pain Descriptors / Indicators Sore;Tightness    Aggravating Factors  activity    Pain Relieving Factors ice, pain meds              [facility] PT Assessment - 05/29/20 0001      AROM   Left Knee Extension 7    Left Knee Flexion 97      PROM   Left Knee Flexion 103                         [facility] Adult PT Treatment/Exercise - 05/29/20 0001      Knee/Hip Exercises: Aerobic   Recumbent Bike 6 min    Nustep L5 x 58mn      Knee/Hip Exercises: Machines for Strengthening   Cybex Knee Extension 5# 2x15    Cybex Knee Flexion 20# 2x15    Cybex Leg Press 20# BLE 2x10    Total Gym Leg Press no weight both legs down to 1      Vasopneumatic   Number Minutes Vasopneumatic  15 minutes    Vasopnuematic Location  Knee    Vasopneumatic Pressure Medium    Vasopneumatic Temperature  36      Manual Therapy   Manual Therapy Passive ROM    Joint Mobilization joint distraction with knee extension  stretch seated    Soft tissue mobilization mobilization of scar tissue and into the quad    Passive ROM knee flex and ext                    PT Short Term Goals - 04/21/20 1356      PT SHORT TERM GOAL #1   Title Ind with intial HEP    Time 2    Period Weeks    Status Achieved             PT Long Term Goals - 05/29/20 1355      PT LONG TERM GOAL #2   Title decrease pain 50%    Status Partially Met      PT LONG TERM GOAL #3   Title increase left knee aROM to 5-115 degrees flexion    Status Partially Met                 Plan - 05/29/20 1353    Clinical Impression Statement Patient is pretty stiff and sore today, she reports doing yarwork yesterday and laundry today, she was in more pain and did not allow much passive ROM, she is at where she was when I measured her last time.    PT Next Visit Plan push ROM and function    Consulted and Agree with Plan of Care Patient           Patient will benefit from skilled therapeutic intervention in order to improve the following deficits and impairments:  Pain, Decreased strength, Decreased range of motion, Abnormal gait, Improper body mechanics, Increased muscle spasms, Decreased mobility, Cardiopulmonary status limiting activity, Postural dysfunction, Difficulty walking, Impaired flexibility, Decreased activity tolerance, Decreased balance, Increased edema  Visit Diagnosis: Stiffness of left knee, not elsewhere classified  Acute pain of left knee  Difficulty in walking, not elsewhere classified  Localized edema  Acute bilateral low back pain with left-sided sciatica     Problem List Patient Active Problem List   Diagnosis Date Noted  . Status post left knee replacement 03/14/2020  . S/P left TKA 03/11/2020  . Preoperative clearance 02/22/2019  . Colon polyp   . Lower GI bleed 12/18/2015  . Diverticulosis  of colon with hemorrhage   . Nausea with vomiting 12/02/2014  . Ileus, postoperative ([place])  11/04/2014  . Hyponatremia 11/04/2014  . Abdominal fluid collection   . Chest pain 10/26/2014  . Acute calculous cholecystitis 10/26/2014  . Barrett esophagus   . History of GI diverticular bleed   . Essential hypertension   . Dyspnea 01/18/2012  . Aortic insufficiency 01/18/2012  . ANEMIA, IRON DEFICIENCY 06/04/2010  . HYPERCHOLESTEROLEMIA 02/08/2008  . GERD 02/08/2008  . ARTHRITIS 02/08/2008  . HIATAL HERNIA 11/02/2007  . DIVERTICULOSIS OF COLON 04/07/2004    Sumner Boast., PT 05/29/2020, 1:56 PM  [address] [place], [place], [address] Phone: [phone]   Fax:  [phone]  Name: DORINNE GRAEFF MRN: [id number] Date of Birth: Oct 29, 1934

## 2020-06-03 ENCOUNTER — Ambulatory Visit: Payer: Medicare HMO | Admitting: Physical Therapy

## 2020-06-03 ENCOUNTER — Other Ambulatory Visit: Payer: Self-pay

## 2020-06-03 ENCOUNTER — Encounter: Payer: Self-pay | Admitting: Physical Therapy

## 2020-06-03 DIAGNOSIS — M25562 Pain in left knee: Secondary | ICD-10-CM | POA: Diagnosis not present

## 2020-06-03 DIAGNOSIS — M25662 Stiffness of left knee, not elsewhere classified: Secondary | ICD-10-CM | POA: Diagnosis not present

## 2020-06-03 DIAGNOSIS — R262 Difficulty in walking, not elsewhere classified: Secondary | ICD-10-CM | POA: Diagnosis not present

## 2020-06-03 DIAGNOSIS — M5442 Lumbago with sciatica, left side: Secondary | ICD-10-CM | POA: Diagnosis not present

## 2020-06-03 DIAGNOSIS — R600 Localized edema: Secondary | ICD-10-CM | POA: Diagnosis not present

## 2020-06-03 NOTE — Therapy (Signed)
[address] [place], [place], [address] Phone: [phone]   Fax:  [phone]  Physical Therapy Treatment  Patient Details  Name: Terri Shah MRN: [id number] Date of Birth: 1934-01-09 Referring Provider (PT): Cathe Mons Date: 06/03/2020   PT End of Session - 06/03/20 1349    Visit Number 24    Date for PT Re-Evaluation 07/03/20    Authorization Type [employer] Medicare    PT Start Time 1314    PT Stop Time 1414    PT Time Calculation (min) 60 min    Activity Tolerance Patient tolerated treatment well           Past Medical History:  Diagnosis Date  . Allergy   . Anemia   . Arthritis   . Barrett esophagus   . Barrett's esophagus 11/02/2007   Qualifier: Diagnosis of  By: Mat Carne    . Blood transfusion without reported diagnosis    late 20's or early 30's  . Cataract   . Diverticulosis   . ECTOPIC PREGNANCY 02/08/2008   Qualifier: Diagnosis of  By: Mat Carne    . Family history of adverse reaction to anesthesia    older sister dont remember what it was   . Fibromyalgia    takes elavil  . Gastritis   . GASTRITIS 11/02/2007   Qualifier: Diagnosis of  By: Mat Carne    . GERD (gastroesophageal reflux disease)   . Glaucoma    right eye  . Heart murmur   . Hiatal hernia   . History of GI diverticular bleed   . Hyperlipidemia   . Hypertension   . Iron deficiency 2020  . Iron deficiency anemia   . Osteoporosis   . Palpitations 04/02/2010   Qualifier: Diagnosis of  By: Angelena Form, MD, Harrell Gave    . PONV (postoperative nausea and vomiting)    after c section  . Presbyesophagus   . Stroke [facility])    "small stroke" 2010- no residual per pt    Past Surgical History:  Procedure Laterality Date  . CATARACT EXTRACTION, BILATERAL    . CESAREAN SECTION  1950-1960s   x 3  . CHOLECYSTECTOMY    . COLONOSCOPY N/A 12/19/2015   Procedure: COLONOSCOPY;  Surgeon: Manus Gunning, MD;  Location: [facility];  Service: Gastroenterology;  Laterality: N/A;  . COLONOSCOPY    . [place]  . FACIAL COSMETIC SURGERY    . LAPAROSCOPIC CHOLECYSTECTOMY SINGLE SITE WITH INTRAOPERATIVE CHOLANGIOGRAM N/A 10/27/2014   Procedure: LAPAROSCOPIC CHOLECYSTECTOMY SINGLE SITE WITH INTRAOPERATIVE CHOLANGIOGRAM;  Surgeon: Lalana Wachter Boston, MD;  Location: [facility];  Service: General;  Laterality: N/A;  . TOTAL KNEE ARTHROPLASTY Left 03/11/2020   Procedure: TOTAL KNEE ARTHROPLASTY;  Surgeon: Paralee Cancel, MD;  Location: [facility];  Service: Orthopedics;  Laterality: Left;  70 mins  . TUBAL LIGATION    . UPPER GASTROINTESTINAL ENDOSCOPY      There were no vitals filed for this visit.   Subjective Assessment - 06/03/20 1318    Subjective Reports that she is doing pretty good today, less pain, reports feeling tired much of the time.    Currently in Pain? Yes    Pain Score 2     Pain Location Knee    Pain Orientation Left    Pain Descriptors / Indicators Sore    Aggravating Factors  walking, standing  [facility] PT Assessment - 06/03/20 0001      Assessment   Medical Diagnosis s/p left TKA    Referring Provider (PT) Alvan Dame      AROM   Left Knee Flexion 100      PROM   Left Knee Flexion 108                         [facility] Adult PT Treatment/Exercise - 06/03/20 0001      Ambulation/Gait   Gait Comments stairs step over step, then walking in hall really focused on speed      Knee/Hip Exercises: Aerobic   Recumbent Bike 6 min    Nustep L5 x 47mn      Vasopneumatic   Number Minutes Vasopneumatic  15 minutes    Vasopnuematic Location  Knee    Vasopneumatic Pressure Medium    Vasopneumatic Temperature  36      Manual Therapy   Manual Therapy Passive ROM    Joint Mobilization joint distraction with knee extension stretch seated    Soft tissue mobilization mobilization of scar tissue and into the quad    Passive ROM knee  flex and ext                    PT Short Term Goals - 04/21/20 1356      PT SHORT TERM GOAL #1   Title Ind with intial HEP    Time 2    Period Weeks    Status Achieved             PT Long Term Goals - 06/03/20 1353      PT LONG TERM GOAL #2   Title decrease pain 50%    Status Partially Met      PT LONG TERM GOAL #3   Title increase left knee aROM to 5-115 degrees flexion    Status Partially Met      PT LONG TERM GOAL #4   Title walk with SPC or without device for all household mobility    Status Partially Met                 Plan - 06/03/20 1350    Clinical Impression Statement [name] reports that she is doing better overall, and her gait is much improved, much less energy required, she is doing more without the cane, able to do the stairs but a little sore.  Her ROM is improving, it is tight and painful.    PT Next Visit Plan push ROM and function    Consulted and Agree with Plan of Care Patient           Patient will benefit from skilled therapeutic intervention in order to improve the following deficits and impairments:  Pain, Decreased strength, Decreased range of motion, Abnormal gait, Improper body mechanics, Increased muscle spasms, Decreased mobility, Cardiopulmonary status limiting activity, Postural dysfunction, Difficulty walking, Impaired flexibility, Decreased activity tolerance, Decreased balance, Increased edema  Visit Diagnosis: Stiffness of left knee, not elsewhere classified - Plan: PT plan of care cert/re-cert  Acute pain of left knee - Plan: PT plan of care cert/re-cert  Difficulty in walking, not elsewhere classified - Plan: PT plan of care cert/re-cert  Localized edema - Plan: PT plan of care cert/re-cert  Acute bilateral low back pain with left-sided sciatica - Plan: PT plan of care cert/re-cert     Problem List Patient Active Problem List   Diagnosis Date Noted  .  Status post left knee replacement 03/14/2020  .  S/P left TKA 03/11/2020  . Preoperative clearance 02/22/2019  . Colon polyp   . Lower GI bleed 12/18/2015  . Diverticulosis of colon with hemorrhage   . Nausea with vomiting 12/02/2014  . Ileus, postoperative ([place]) 11/04/2014  . Hyponatremia 11/04/2014  . Abdominal fluid collection   . Chest pain 10/26/2014  . Acute calculous cholecystitis 10/26/2014  . Barrett esophagus   . History of GI diverticular bleed   . Essential hypertension   . Dyspnea 01/18/2012  . Aortic insufficiency 01/18/2012  . ANEMIA, IRON DEFICIENCY 06/04/2010  . HYPERCHOLESTEROLEMIA 02/08/2008  . GERD 02/08/2008  . ARTHRITIS 02/08/2008  . HIATAL HERNIA 11/02/2007  . DIVERTICULOSIS OF COLON 04/07/2004    Sumner Boast., PT 06/03/2020, 1:59 PM  [address] [place], [place], [address] Phone: [phone]   Fax:  [phone]  Name: Terri Shah MRN: [id number] Date of Birth: [birth year]

## 2020-06-05 ENCOUNTER — Encounter: Payer: Self-pay | Admitting: Physical Therapy

## 2020-06-05 ENCOUNTER — Other Ambulatory Visit: Payer: Self-pay

## 2020-06-05 ENCOUNTER — Ambulatory Visit: Payer: Medicare HMO | Admitting: Physical Therapy

## 2020-06-05 DIAGNOSIS — M5442 Lumbago with sciatica, left side: Secondary | ICD-10-CM | POA: Diagnosis not present

## 2020-06-05 DIAGNOSIS — M25662 Stiffness of left knee, not elsewhere classified: Secondary | ICD-10-CM | POA: Diagnosis not present

## 2020-06-05 DIAGNOSIS — Z0000 Encounter for general adult medical examination without abnormal findings: Secondary | ICD-10-CM | POA: Diagnosis not present

## 2020-06-05 DIAGNOSIS — R600 Localized edema: Secondary | ICD-10-CM

## 2020-06-05 DIAGNOSIS — R262 Difficulty in walking, not elsewhere classified: Secondary | ICD-10-CM

## 2020-06-05 DIAGNOSIS — E7849 Other hyperlipidemia: Secondary | ICD-10-CM | POA: Diagnosis not present

## 2020-06-05 DIAGNOSIS — M25562 Pain in left knee: Secondary | ICD-10-CM

## 2020-06-05 DIAGNOSIS — M810 Age-related osteoporosis without current pathological fracture: Secondary | ICD-10-CM | POA: Diagnosis not present

## 2020-06-05 NOTE — Therapy (Signed)
[address] [place], [place], [address] Phone: [phone]   Fax:  [phone]  Physical Therapy Treatment  Patient Details  Name: Terri Shah MRN: [id number] Date of Birth: Aug 31, 1934 Referring Provider (PT): Cathe Mons Date: 06/05/2020   PT End of Session - 06/05/20 1649    Visit Number 25    Date for PT Re-Evaluation 07/03/20    Authorization Type [employer] Medicare    PT Start Time 1620    PT Stop Time 1705    PT Time Calculation (min) 45 min    Activity Tolerance Patient tolerated treatment well    Behavior During Therapy [facility] for tasks assessed/performed           Past Medical History:  Diagnosis Date  . Allergy   . Anemia   . Arthritis   . Barrett esophagus   . Barrett's esophagus 11/02/2007   Qualifier: Diagnosis of  By: Mat Carne    . Blood transfusion without reported diagnosis    late 20's or early 30's  . Cataract   . Diverticulosis   . ECTOPIC PREGNANCY 02/08/2008   Qualifier: Diagnosis of  By: Mat Carne    . Family history of adverse reaction to anesthesia    older sister dont remember what it was   . Fibromyalgia    takes elavil  . Gastritis   . GASTRITIS 11/02/2007   Qualifier: Diagnosis of  By: Mat Carne    . GERD (gastroesophageal reflux disease)   . Glaucoma    right eye  . Heart murmur   . Hiatal hernia   . History of GI diverticular bleed   . Hyperlipidemia   . Hypertension   . Iron deficiency 2020  . Iron deficiency anemia   . Osteoporosis   . Palpitations 04/02/2010   Qualifier: Diagnosis of  By: Angelena Form, MD, Harrell Gave    . PONV (postoperative nausea and vomiting)    after c section  . Presbyesophagus   . Stroke [facility])    "small stroke" 2010- no residual per pt    Past Surgical History:  Procedure Laterality Date  . CATARACT EXTRACTION, BILATERAL    . CESAREAN SECTION  1950-1960s   x 3  . CHOLECYSTECTOMY    . COLONOSCOPY N/A  12/19/2015   Procedure: COLONOSCOPY;  Surgeon: Manus Gunning, MD;  Location: [facility];  Service: Gastroenterology;  Laterality: N/A;  . COLONOSCOPY    . [place]  . FACIAL COSMETIC SURGERY    . LAPAROSCOPIC CHOLECYSTECTOMY SINGLE SITE WITH INTRAOPERATIVE CHOLANGIOGRAM N/A 10/27/2014   Procedure: LAPAROSCOPIC CHOLECYSTECTOMY SINGLE SITE WITH INTRAOPERATIVE CHOLANGIOGRAM;  Surgeon: Massey Ruhland Boston, MD;  [facility];  Service: General;  Laterality: N/A;  . TOTAL KNEE ARTHROPLASTY Left 03/11/2020   Procedure: TOTAL KNEE ARTHROPLASTY;  Surgeon: Paralee Cancel, MD;  [facility];  Service: Orthopedics;  Laterality: Left;  70 mins  . TUBAL LIGATION    . UPPER GASTROINTESTINAL ENDOSCOPY      There were no vitals filed for this visit.   Subjective Assessment - 06/05/20 1623    Subjective Doing well, not hurting much    Currently in Pain? Yes    Pain Score 2     Pain Location Knee    Pain Orientation Left              [facility] PT Assessment - 06/05/20 0001  PROM   Left Knee Flexion 109                         [facility] Adult PT Treatment/Exercise - 06/05/20 0001      Knee/Hip Exercises: Aerobic   Recumbent Bike 6 min    Nustep L5 x 15mn      Knee/Hip Exercises: Standing   Heel Raises --    Other Standing Knee Exercises 6" toe clears, 6" lunge stretch with PT overpressure      Vasopneumatic   Number Minutes Vasopneumatic  10 minutes    Vasopnuematic Location  Knee    Vasopneumatic Pressure Medium    Vasopneumatic Temperature  35      Manual Therapy   Manual Therapy Passive ROM    Joint Mobilization joint distraction with knee extension stretch seated    Soft tissue mobilization mobilization of scar tissue and into the quad    Passive ROM knee flex and ext                    PT Short Term Goals - 04/21/20 1356      PT SHORT TERM GOAL #1   Title Ind with intial HEP    Time 2    Period Weeks    Status  Achieved             PT Long Term Goals - 06/03/20 1353      PT LONG TERM GOAL #2   Title decrease pain 50%    Status Partially Met      PT LONG TERM GOAL #3   Title increase left knee aROM to 5-115 degrees flexion    Status Partially Met      PT LONG TERM GOAL #4   Title walk with SPC or without device for all household mobility    Status Partially Met                 Plan - 06/05/20 1655    Clinical Impression Statement Patient had a little more trouble with ROM on the bike today, she c/o stiffness.  I focused the treatment on manual therapy to work on the stiffness, she really did well flexion passively to 109 degrees she is still gaurded and resistant due to pain.    PT Next Visit Plan push ROM and function    Consulted and Agree with Plan of Care Patient           Patient will benefit from skilled therapeutic intervention in order to improve the following deficits and impairments:  Pain, Decreased strength, Decreased range of motion, Abnormal gait, Improper body mechanics, Increased muscle spasms, Decreased mobility, Cardiopulmonary status limiting activity, Postural dysfunction, Difficulty walking, Impaired flexibility, Decreased activity tolerance, Decreased balance, Increased edema  Visit Diagnosis: Stiffness of left knee, not elsewhere classified  Acute pain of left knee  Difficulty in walking, not elsewhere classified  Localized edema     Problem List Patient Active Problem List   Diagnosis Date Noted  . Status post left knee replacement 03/14/2020  . S/P left TKA 03/11/2020  . Preoperative clearance 02/22/2019  . Colon polyp   . Lower GI bleed 12/18/2015  . Diverticulosis of colon with hemorrhage   . Nausea with vomiting 12/02/2014  . [place] 11/04/[place]  . Hyponatremia 11/04/2014  . Abdominal fluid collection   . Chest pain 10/26/2014  . Acute calculous cholecystitis 10/26/2014  . Barrett esophagus   .  History of GI  diverticular bleed   . Essential hypertension   . Dyspnea 01/18/2012  . Aortic insufficiency 01/18/2012  . ANEMIA, IRON DEFICIENCY 06/04/2010  . HYPERCHOLESTEROLEMIA 02/08/2008  . GERD 02/08/2008  . ARTHRITIS 02/08/2008  . HIATAL HERNIA 11/02/2007  . DIVERTICULOSIS OF COLON 04/07/2004    Sumner Boast., PT 06/05/2020, 5:03 PM  [address] [place], [place], [address] Phone: [phone]   Fax:  [phone]  Name: Terri Shah MRN: [id number] Date of Birth: 01/22/1934

## 2020-06-09 ENCOUNTER — Ambulatory Visit: Payer: Medicare HMO | Admitting: Physical Therapy

## 2020-06-09 ENCOUNTER — Other Ambulatory Visit: Payer: Self-pay

## 2020-06-09 ENCOUNTER — Encounter: Payer: Self-pay | Admitting: Physical Therapy

## 2020-06-09 DIAGNOSIS — R600 Localized edema: Secondary | ICD-10-CM

## 2020-06-09 DIAGNOSIS — M25662 Stiffness of left knee, not elsewhere classified: Secondary | ICD-10-CM | POA: Diagnosis not present

## 2020-06-09 DIAGNOSIS — M25562 Pain in left knee: Secondary | ICD-10-CM

## 2020-06-09 DIAGNOSIS — M5442 Lumbago with sciatica, left side: Secondary | ICD-10-CM

## 2020-06-09 DIAGNOSIS — R262 Difficulty in walking, not elsewhere classified: Secondary | ICD-10-CM | POA: Diagnosis not present

## 2020-06-09 NOTE — Therapy (Signed)
[place], [place], [address] Phone: [phone]   Fax:  [phone]  Physical Therapy Treatment  Patient Details  Name: Terri Shah MRN: [id number] Date of Birth: [birth year] Referring Provider (PT): Alvan Dame   Encounter Date: 06/09/2020   PT End of Session - 06/09/20 1140    Visit Number 26    Date for PT Re-Evaluation 07/03/20    Authorization Type [employer] Medicare    PT Start Time 1100    PT Stop Time 1149    PT Time Calculation (min) 49 min    Activity Tolerance Patient tolerated treatment well    Behavior During Therapy [facility] for tasks assessed/performed           Past Medical History:  Diagnosis Date   Allergy    Anemia    Arthritis    Barrett esophagus    Barrett's esophagus 11/02/2007   Qualifier: Diagnosis of  By: Gretta Cool RN, Cheryl     Blood transfusion without reported diagnosis    late 20's or early 30's   Cataract    Diverticulosis    ECTOPIC PREGNANCY 02/08/2008   Qualifier: Diagnosis of  By: Gretta Cool RN, Malachy Mood     Family history of adverse reaction to anesthesia    older sister dont remember what it was    Fibromyalgia    takes elavil   Gastritis    GASTRITIS 11/02/2007   Qualifier: Diagnosis of  By: Gretta Cool RN, Malachy Mood     GERD (gastroesophageal reflux disease)    Glaucoma    right eye   Heart murmur    Hiatal hernia    History of GI diverticular bleed    Hyperlipidemia    Hypertension    Iron deficiency 2020   Iron deficiency anemia    Osteoporosis    Palpitations 04/02/2010   Qualifier: Diagnosis of  By: Angelena Form, MD, Christopher     PONV (postoperative nausea and vomiting)    after c section   Presbyesophagus    Stroke [facility])    "small stroke" 2010- no residual per pt    Past Surgical History:  Procedure Laterality Date   CATARACT EXTRACTION, BILATERAL     CESAREAN SECTION  1950-1960s   x 3   CHOLECYSTECTOMY     COLONOSCOPY N/A  12/19/2015   Procedure: COLONOSCOPY;  Surgeon: Manus Gunning, MD;  Location: [facility];  Service: Gastroenterology;  Laterality: N/A;   COLONOSCOPY     ECTOPIC PREGNANCY SURGERY     1962   FACIAL COSMETIC SURGERY     LAPAROSCOPIC CHOLECYSTECTOMY SINGLE SITE WITH INTRAOPERATIVE CHOLANGIOGRAM N/A 10/27/2014   Procedure: LAPAROSCOPIC CHOLECYSTECTOMY SINGLE SITE WITH INTRAOPERATIVE CHOLANGIOGRAM;  Surgeon: Michael Boston, MD;  Location: [facility];  Service: General;  Laterality: N/A;   TOTAL KNEE ARTHROPLASTY Left 03/11/2020   Procedure: TOTAL KNEE ARTHROPLASTY;  Surgeon: Paralee Cancel, MD;  Location: [facility];  Service: Orthopedics;  Laterality: Left;  70 mins   TUBAL LIGATION     UPPER GASTROINTESTINAL ENDOSCOPY      There were no vitals filed for this visit.   Subjective Assessment - 06/09/20 1108    Subjective Pt reports she is very stiff and a little more painful today    Currently in Pain? Yes    Pain Score 5     Pain Location Knee    Pain Orientation Left  [place] Adult PT Treatment/Exercise - 06/09/20 0001      Knee/Hip Exercises: Aerobic   Recumbent Bike 6 min    Nustep L5 x 6 min      Knee/Hip Exercises: Machines for Strengthening   Cybex Knee Extension 5# 2x15    Cybex Knee Flexion 25# 2x15      Vasopneumatic   Number Minutes Vasopneumatic  15 minutes    Vasopnuematic Location  Knee    Vasopneumatic Pressure Medium    Vasopneumatic Temperature  35      Manual Therapy   Manual Therapy Passive ROM    Joint Mobilization joint distraction with knee extension stretch seated    Passive ROM knee flex and ext                    PT Short Term Goals - 04/21/20 1356      PT SHORT TERM GOAL #1   Title Ind with intial HEP    Time 2    Period Weeks    Status Achieved             PT Long Term Goals - 06/03/20 1353      PT LONG TERM GOAL #2   Title decrease pain 50%    Status Partially Met      PT  LONG TERM GOAL #3   Title increase left knee aROM to 5-115 degrees flexion    Status Partially Met      PT LONG TERM GOAL #4   Title walk with SPC or without device for all household mobility    Status Partially Met                 Plan - 06/09/20 1141    Clinical Impression Statement Pt reports she had a tough weekend and was still feeling very stiff in session today. Has progressed in terms of passive flexion; still struggles with active knee flexion. Pt still struggling with some edema; reports relief with vaso. Did well with passive ROM today with less guarding.    PT Treatment/Interventions ADLs/Self Care Home Management;Cryotherapy;Electrical Stimulation;Moist Heat;Ultrasound;Therapeutic activities;Therapeutic exercise;Neuromuscular re-education;Manual techniques;Patient/family education;Vasopneumatic Device;Functional mobility training;Stair training;Gait training    PT Next Visit Plan push ROM and function    Consulted and Agree with Plan of Care Patient           Patient will benefit from skilled therapeutic intervention in order to improve the following deficits and impairments:  Pain, Decreased strength, Decreased range of motion, Abnormal gait, Improper body mechanics, Increased muscle spasms, Decreased mobility, Cardiopulmonary status limiting activity, Postural dysfunction, Difficulty walking, Impaired flexibility, Decreased activity tolerance, Decreased balance, Increased edema  Visit Diagnosis: Stiffness of left knee, not elsewhere classified  Acute pain of left knee  Difficulty in walking, not elsewhere classified  Localized edema  Acute bilateral low back pain with left-sided sciatica     Problem List Patient Active Problem List   Diagnosis Date Noted   Status post left knee replacement 03/14/2020   S/P left TKA 03/11/2020   Preoperative clearance 02/22/2019   Colon polyp    Lower GI bleed 12/18/2015   Diverticulosis of colon with hemorrhage     Nausea with vomiting 12/02/2014   Ileus, postoperative ([place]) 11/04/2014   Hyponatremia 11/04/2014   Abdominal fluid collection    Chest pain 10/26/2014   Acute calculous cholecystitis 10/26/2014   Barrett esophagus    History of GI diverticular bleed    Essential hypertension    Dyspnea 01/18/2012  Aortic insufficiency 01/18/2012   ANEMIA, IRON DEFICIENCY 06/04/2010   HYPERCHOLESTEROLEMIA 02/08/2008   GERD 02/08/2008   ARTHRITIS 02/08/2008   HIATAL HERNIA 11/02/2007   DIVERTICULOSIS OF COLON 04/07/2004   Amador Cunas, PT, DPT Donald Prose Sergio Hobart 06/09/2020, 11:43 AM  [place] [address] [address] [place], [place], [address] Phone: [phone]   Fax:  [phone]  Name: TATYM SCHERMER MRN: [id number] Date of Birth: 12/20/1934

## 2020-06-12 ENCOUNTER — Encounter: Payer: Medicare HMO | Admitting: Physical Therapy

## 2020-06-12 DIAGNOSIS — H9190 Unspecified hearing loss, unspecified ear: Secondary | ICD-10-CM | POA: Diagnosis not present

## 2020-06-12 DIAGNOSIS — K5903 Drug induced constipation: Secondary | ICD-10-CM | POA: Diagnosis not present

## 2020-06-12 DIAGNOSIS — R931 Abnormal findings on diagnostic imaging of heart and coronary circulation: Secondary | ICD-10-CM | POA: Diagnosis not present

## 2020-06-12 DIAGNOSIS — I10 Essential (primary) hypertension: Secondary | ICD-10-CM | POA: Diagnosis not present

## 2020-06-12 DIAGNOSIS — D509 Iron deficiency anemia, unspecified: Secondary | ICD-10-CM | POA: Diagnosis not present

## 2020-06-12 DIAGNOSIS — J309 Allergic rhinitis, unspecified: Secondary | ICD-10-CM | POA: Diagnosis not present

## 2020-06-12 DIAGNOSIS — M1990 Unspecified osteoarthritis, unspecified site: Secondary | ICD-10-CM | POA: Diagnosis not present

## 2020-06-12 DIAGNOSIS — E871 Hypo-osmolality and hyponatremia: Secondary | ICD-10-CM | POA: Diagnosis not present

## 2020-06-12 DIAGNOSIS — R490 Dysphonia: Secondary | ICD-10-CM | POA: Diagnosis not present

## 2020-06-12 DIAGNOSIS — Z0000 Encounter for general adult medical examination without abnormal findings: Secondary | ICD-10-CM | POA: Diagnosis not present

## 2020-06-12 DIAGNOSIS — R82998 Other abnormal findings in urine: Secondary | ICD-10-CM | POA: Diagnosis not present

## 2020-06-13 ENCOUNTER — Ambulatory Visit: Payer: Medicare HMO | Admitting: Physical Therapy

## 2020-06-13 ENCOUNTER — Encounter: Payer: Self-pay | Admitting: Physical Therapy

## 2020-06-13 ENCOUNTER — Other Ambulatory Visit: Payer: Self-pay

## 2020-06-13 DIAGNOSIS — R600 Localized edema: Secondary | ICD-10-CM | POA: Diagnosis not present

## 2020-06-13 DIAGNOSIS — M25562 Pain in left knee: Secondary | ICD-10-CM

## 2020-06-13 DIAGNOSIS — M5442 Lumbago with sciatica, left side: Secondary | ICD-10-CM | POA: Diagnosis not present

## 2020-06-13 DIAGNOSIS — R262 Difficulty in walking, not elsewhere classified: Secondary | ICD-10-CM | POA: Diagnosis not present

## 2020-06-13 DIAGNOSIS — M25662 Stiffness of left knee, not elsewhere classified: Secondary | ICD-10-CM | POA: Diagnosis not present

## 2020-06-13 NOTE — Therapy (Signed)
[facility] [address] [place], [place], [address] Phone: [phone]   Fax:  [phone]  Physical Therapy Treatment  Patient Details  Name: Terri Shah MRN: [id number] Date of Birth: [birth year] Referring Provider (PT): Merry Proud Date: 06/13/2020   PT End of Session - 06/13/20 1006    Visit Number 27    Date for PT Re-Evaluation 07/03/20    Authorization Type [employer] Medicare    PT Start Time 0930    PT Stop Time 1025    PT Time Calculation (min) 55 min    Activity Tolerance Patient tolerated treatment well    Behavior During Therapy [facility] for tasks assessed/performed           Past Medical History:  Diagnosis Date  . Allergy   . Anemia   . Arthritis   . Barrett esophagus   . Barrett's esophagus 11/02/2007   Qualifier: Diagnosis of  By: Sadie Haber    . Blood transfusion without reported diagnosis    late 20's or early 30's  . Cataract   . Diverticulosis   . ECTOPIC PREGNANCY 02/08/2008   Qualifier: Diagnosis of  By: Sadie Haber    . Family history of adverse reaction to anesthesia    older sister dont remember what it was   . Fibromyalgia    takes elavil  . Gastritis   . GASTRITIS 11/02/2007   Qualifier: Diagnosis of  By: Sadie Haber    . GERD (gastroesophageal reflux disease)   . Glaucoma    right eye  . Heart murmur   . Hiatal hernia   . History of GI diverticular bleed   . Hyperlipidemia   . Hypertension   . Iron deficiency 2020  . Iron deficiency anemia   . Osteoporosis   . Palpitations 04/02/2010   Qualifier: Diagnosis of  By: Clifton James, MD, Cristal Deer    . PONV (postoperative nausea and vomiting)    after c section  . Presbyesophagus   . Stroke [facility])    "small stroke" 2010- no residual per pt    Past Surgical History:  Procedure Laterality Date  . CATARACT EXTRACTION, BILATERAL    . CESAREAN SECTION  1950-1960s   x 3  . CHOLECYSTECTOMY    . COLONOSCOPY N/A  12/19/2015   Procedure: COLONOSCOPY;  Surgeon: Ruffin Frederick, MD;  Location[facility];  Service: Gastroenterology;  Laterality: N/A;  . COLONOSCOPY    . ECTOPIC PREGNANCY SURGERY     1962  . FACIAL COSMETIC SURGERY    . LAPAROSCOPIC CHOLECYSTECTOMY SINGLE SITE WITH INTRAOPERATIVE CHOLANGIOGRAM N/A 10/27/2014   Procedure: LAPAROSCOPIC CHOLECYSTECTOMY SINGLE SITE WITH INTRAOPERATIVE CHOLANGIOGRAM;  Surgeon: Karie Soda, MD;  [facility];  Service: General;  Laterality: N/A;  . TOTAL KNEE ARTHROPLASTY Left 03/11/2020   Procedure: TOTAL KNEE ARTHROPLASTY;  Surgeon: Durene Romans, MD;  Location[facility];  Service: Orthopedics;  Laterality: Left;  70 mins  . TUBAL LIGATION    . UPPER GASTROINTESTINAL ENDOSCOPY      There were no vitals filed for this visit.   Subjective Assessment - 06/13/20 0939    Subjective Patient reports some medial knee throbbing the past few days.  She is now doing all home mobility wihtout device, some shopping wihtout it    Currently in Pain? Yes    Pain Score 4     Pain Location Knee    Pain Orientation Left;Medial    Pain  Descriptors / Indicators Aching;Sore;Throbbing    Aggravating Factors  walking              [facility] PT Assessment - 06/13/20 0001      AROM   Left Knee Flexion 104                         [facility] Adult PT Treatment/Exercise - 06/13/20 0001      Ambulation/Gait   Gait Comments stairs step over step, then walking in hall really focused on speed      Knee/Hip Exercises: Aerobic   Recumbent Bike 6 min    Nustep L5 x 6 min      Knee/Hip Exercises: Machines for Strengthening   Cybex Knee Extension 5# 2x15    Cybex Knee Flexion 25# 2x15    Cybex Leg Press 20# BLE 2x10, no weight working on flexion      Vasopneumatic   Number Minutes Vasopneumatic  10 minutes    Vasopnuematic Location  Knee    Vasopneumatic Pressure Low    Vasopneumatic Temperature  34      Manual Therapy   Manual Therapy Passive ROM     Joint Mobilization joint distraction with knee extension stretch seated    Soft tissue mobilization mobilization of scar tissue and into the quad    Passive ROM knee flex and ext                    PT Short Term Goals - 04/21/20 1356      PT SHORT TERM GOAL #1   Title Ind with intial HEP    Time 2    Period Weeks    Status Achieved             PT Long Term Goals - 06/03/20 1353      PT LONG TERM GOAL #2   Title decrease pain 50%    Status Partially Met      PT LONG TERM GOAL #3   Title increase left knee aROM to 5-115 degrees flexion    Status Partially Met      PT LONG TERM GOAL #4   Title walk with SPC or without device for all household mobility    Status Partially Met                 Plan - 06/13/20 1008    Clinical Impression Statement Patient is doing better, less issues with walking, less pain but still very painful.  She does have swelling in the knee and the ankle.  She is improving with the ROM slowly    PT Next Visit Plan push ROM and function    Consulted and Agree with Plan of Care Patient           Patient will benefit from skilled therapeutic intervention in order to improve the following deficits and impairments:  Pain, Decreased strength, Decreased range of motion, Abnormal gait, Improper body mechanics, Increased muscle spasms, Decreased mobility, Cardiopulmonary status limiting activity, Postural dysfunction, Difficulty walking, Impaired flexibility, Decreased activity tolerance, Decreased balance, Increased edema  Visit Diagnosis: Stiffness of left knee, not elsewhere classified  Acute pain of left knee  Difficulty in walking, not elsewhere classified  Localized edema     Problem List Patient Active Problem List   Diagnosis Date Noted  . Status post left knee replacement 03/14/2020  . S/P left TKA 03/11/2020  . Preoperative clearance 02/22/2019  . Colon polyp   .  Lower GI bleed 12/18/2015  . Diverticulosis of  colon with hemorrhage   . Nausea with vomiting 12/02/2014  . Ileus, postoperative ([place]) 11/04/2014  . Hyponatremia 11/04/2014  . Abdominal fluid collection   . Chest pain 10/26/2014  . Acute calculous cholecystitis 10/26/2014  . Barrett esophagus   . History of GI diverticular bleed   . Essential hypertension   . Dyspnea 01/18/2012  . Aortic insufficiency 01/18/2012  . ANEMIA, IRON DEFICIENCY 06/04/2010  . HYPERCHOLESTEROLEMIA 02/08/2008  . GERD 02/08/2008  . ARTHRITIS 02/08/2008  . HIATAL HERNIA 11/02/2007  . DIVERTICULOSIS OF COLON 04/07/2004    Sumner Boast., PT 06/13/2020, 10:18 AM  [place] [address] [place], [place], [address] Phone: [phone]   Fax:  [phone]  Name: Terri Shah MRN: [id number] Date of Birth: [birth year]

## 2020-06-16 ENCOUNTER — Encounter: Payer: Self-pay | Admitting: Physical Therapy

## 2020-06-16 ENCOUNTER — Ambulatory Visit: Payer: Medicare HMO | Admitting: Physical Therapy

## 2020-06-16 ENCOUNTER — Other Ambulatory Visit: Payer: Self-pay

## 2020-06-16 DIAGNOSIS — R262 Difficulty in walking, not elsewhere classified: Secondary | ICD-10-CM

## 2020-06-16 DIAGNOSIS — R600 Localized edema: Secondary | ICD-10-CM

## 2020-06-16 DIAGNOSIS — M5442 Lumbago with sciatica, left side: Secondary | ICD-10-CM | POA: Diagnosis not present

## 2020-06-16 DIAGNOSIS — M25562 Pain in left knee: Secondary | ICD-10-CM

## 2020-06-16 DIAGNOSIS — M25662 Stiffness of left knee, not elsewhere classified: Secondary | ICD-10-CM

## 2020-06-16 NOTE — Therapy (Signed)
[place], [place], [address] Phone: [phone]   Fax:  [phone]  Physical Therapy Treatment  Patient Details  Name: Terri Shah MRN: [id number] Date of Birth: 1934/04/25 Referring Provider (PT): Cathe Mons Date: 06/16/2020   PT End of Session - 06/16/20 1618    Visit Number 28    Date for PT Re-Evaluation 07/03/20    Authorization Type [employer]na Medicare    PT Start Time 6962    PT Stop Time 1629    PT Time Calculation (min) 59 min    Activity Tolerance Patient tolerated treatment well    Behavior During Therapy [facility] for tasks assessed/performed           Past Medical History:  Diagnosis Date  . Allergy   . Anemia   . Arthritis   . Barrett esophagus   . Barrett's esophagus 11/02/2007   Qualifier: Diagnosis of  By: Mat Carne    . Blood transfusion without reported diagnosis    late 20's or early 30's  . Cataract   . Diverticulosis   . ECTOPIC PREGNANCY 02/08/2008   Qualifier: Diagnosis of  By: Mat Carne    . Family history of adverse reaction to anesthesia    older sister dont remember what it was   . Fibromyalgia    takes elavil  . Gastritis   . GASTRITIS 11/02/2007   Qualifier: Diagnosis of  By: Mat Carne    . GERD (gastroesophageal reflux disease)   . Glaucoma    right eye  . Heart murmur   . Hiatal hernia   . History of GI diverticular bleed   . Hyperlipidemia   . Hypertension   . Iron deficiency 2020  . Iron deficiency anemia   . Osteoporosis   . Palpitations 04/02/2010   Qualifier: Diagnosis of  By: Angelena Form, MD, Harrell Gave    . PONV (postoperative nausea and vomiting)    after c section  . Presbyesophagus   . Stroke [facility])    "small stroke" 2010- no residual per pt    Past Surgical History:  Procedure Laterality Date  . CATARACT EXTRACTION, BILATERAL    . CESAREAN SECTION  1950-1960s   x 3  . CHOLECYSTECTOMY    . COLONOSCOPY N/A  12/19/2015   Procedure: COLONOSCOPY;  Surgeon: Manus Gunning, MD;  Location: [facility];  Service: Gastroenterology;  Laterality: N/A;  . COLONOSCOPY    . [place]  . FACIAL COSMETIC SURGERY    . LAPAROSCOPIC CHOLECYSTECTOMY SINGLE SITE WITH INTRAOPERATIVE CHOLANGIOGRAM N/A 10/27/2014   Procedure: LAPAROSCOPIC CHOLECYSTECTOMY SINGLE SITE WITH INTRAOPERATIVE CHOLANGIOGRAM;  Surgeon: Michael Boston, MD;  [facility];  Service: General;  Laterality: N/A;  . TOTAL KNEE ARTHROPLASTY Left 03/11/2020   Procedure: TOTAL KNEE ARTHROPLASTY;  Surgeon: Paralee Cancel, MD;  Location[facility];  Service: Orthopedics;  Laterality: Left;  70 mins  . TUBAL LIGATION    . UPPER GASTROINTESTINAL ENDOSCOPY      There were no vitals filed for this visit.   Subjective Assessment - 06/16/20 1534    Subjective Pt reports that her knee is pretty sore today but that she feels good overall    Currently in Pain? Yes    Pain Score 3     Pain Location Knee    Pain Orientation Left;Medial  [place] Adult PT Treatment/Exercise - 06/16/20 0001      Knee/Hip Exercises: Aerobic   Recumbent Bike 6 min    Nustep L5 x 6 min      Knee/Hip Exercises: Machines for Strengthening   Cybex Knee Extension 5# 2x15    Cybex Knee Flexion 25# 2x15    Cybex Leg Press 20# BLE 2x10, no weight working on flexion      Vasopneumatic   Number Minutes Vasopneumatic  12 minutes    Vasopnuematic Location  Knee    Vasopneumatic Pressure Low    Vasopneumatic Temperature  34      Manual Therapy   Manual Therapy Passive ROM    Joint Mobilization joint distraction with knee extension stretch seated    Soft tissue mobilization mobilization of scar tissue and into the quad    Passive ROM knee flex and ext                    PT Short Term Goals - 04/21/20 1356      PT SHORT TERM GOAL #1   Title Ind with intial HEP    Time 2    Period Weeks     Status Achieved             PT Long Term Goals - 06/03/20 1353      PT LONG TERM GOAL #2   Title decrease pain 50%    Status Partially Met      PT LONG TERM GOAL #3   Title increase left knee aROM to 5-115 degrees flexion    Status Partially Met      PT LONG TERM GOAL #4   Title walk with SPC or without device for all household mobility    Status Partially Met                 Plan - 06/16/20 1621    Clinical Impression Statement Focused more on manual today d/t pt report that she "overdid it" this weekend and knee is very sore today. Pt tolerated manual ROM and joint distraction; progressing with strength.    PT Treatment/Interventions ADLs/Self Care Home Management;Cryotherapy;Electrical Stimulation;Moist Heat;Ultrasound;Therapeutic activities;Therapeutic exercise;Neuromuscular re-education;Manual techniques;Patient/family education;Vasopneumatic Device;Functional mobility training;Stair training;Gait training    PT Next Visit Plan push ROM and function    Consulted and Agree with Plan of Care Patient           Patient will benefit from skilled therapeutic intervention in order to improve the following deficits and impairments:  Pain, Decreased strength, Decreased range of motion, Abnormal gait, Improper body mechanics, Increased muscle spasms, Decreased mobility, Cardiopulmonary status limiting activity, Postural dysfunction, Difficulty walking, Impaired flexibility, Decreased activity tolerance, Decreased balance, Increased edema  Visit Diagnosis: Stiffness of left knee, not elsewhere classified  Acute pain of left knee  Difficulty in walking, not elsewhere classified  Localized edema     Problem List Patient Active Problem List   Diagnosis Date Noted  . Status post left knee replacement 03/14/2020  . S/P left TKA 03/11/2020  . Preoperative clearance 02/22/2019  . Colon polyp   . Lower GI bleed 12/18/2015  . Diverticulosis of colon with hemorrhage   .  Nausea with vomiting 12/02/2014  . Ileus, postoperative ([place]) 11/04/2014  . Hyponatremia 11/04/2014  . Abdominal fluid collection   . Chest pain 10/26/2014  . Acute calculous cholecystitis 10/26/2014  . Barrett esophagus   . History of GI diverticular bleed   . Essential hypertension   . Dyspnea  01/18/2012  . Aortic insufficiency 01/18/2012  . ANEMIA, IRON DEFICIENCY 06/04/2010  . HYPERCHOLESTEROLEMIA 02/08/2008  . GERD 02/08/2008  . ARTHRITIS 02/08/2008  . HIATAL HERNIA 11/02/2007  . DIVERTICULOSIS OF COLON 04/07/2004   Amador Cunas, PT, DPT Donald Prose Amylynn Fano 06/16/2020, 4:23 PM  [place] [address] [address] [place], [place], [address] Phone: [phone]   Fax:  [phone]  Name: Terri Shah MRN: [id number] Date of Birth: 03-04-1934

## 2020-06-20 DIAGNOSIS — Z1212 Encounter for screening for malignant neoplasm of rectum: Secondary | ICD-10-CM | POA: Diagnosis not present

## 2020-06-24 ENCOUNTER — Encounter: Payer: Self-pay | Admitting: Physical Therapy

## 2020-06-24 ENCOUNTER — Other Ambulatory Visit: Payer: Self-pay

## 2020-06-24 ENCOUNTER — Ambulatory Visit: Payer: Medicare HMO | Attending: Physical Medicine and Rehabilitation | Admitting: Physical Therapy

## 2020-06-24 DIAGNOSIS — R262 Difficulty in walking, not elsewhere classified: Secondary | ICD-10-CM | POA: Insufficient documentation

## 2020-06-24 DIAGNOSIS — M25562 Pain in left knee: Secondary | ICD-10-CM | POA: Diagnosis not present

## 2020-06-24 DIAGNOSIS — R600 Localized edema: Secondary | ICD-10-CM

## 2020-06-24 DIAGNOSIS — M25662 Stiffness of left knee, not elsewhere classified: Secondary | ICD-10-CM

## 2020-06-24 DIAGNOSIS — M5442 Lumbago with sciatica, left side: Secondary | ICD-10-CM | POA: Insufficient documentation

## 2020-06-24 NOTE — Therapy (Signed)
[place], [place], [address] Phone: [phone]   Fax:  [phone]  Physical Therapy Treatment  Patient Details  Name: Terri Shah MRN: [id number] Date of Birth: [birth year] Referring Provider (PT): Cathe Mons Date: 06/24/2020   PT End of Session - 06/24/20 1403    Visit Number 29    Date for PT Re-Evaluation 07/03/20    Authorization Type [employer] Medicare    PT Start Time 1314    PT Stop Time 1403    PT Time Calculation (min) 49 min    Activity Tolerance Patient tolerated treatment well    Behavior During Therapy Terri Shah, Terri Shah for tasks assessed/performed           Past Medical History:  Diagnosis Date  . Allergy   . Anemia   . Arthritis   . Barrett esophagus   . Barrett's esophagus 11/02/2007   Qualifier: Diagnosis of  By: Mat Carne    . Blood transfusion without reported diagnosis    late 20's or early 30's  . Cataract   . Diverticulosis   . ECTOPIC PREGNANCY 02/08/2008   Qualifier: Diagnosis of  By: Mat Carne    . Family history of adverse reaction to anesthesia    older sister dont remember what it was   . Fibromyalgia    takes elavil  . Gastritis   . GASTRITIS 11/02/2007   Qualifier: Diagnosis of  By: Mat Carne    . GERD (gastroesophageal reflux disease)   . Glaucoma    right eye  . Heart murmur   . Hiatal hernia   . History of GI diverticular bleed   . Hyperlipidemia   . Hypertension   . Iron deficiency 2020  . Iron deficiency anemia   . Osteoporosis   . Palpitations 04/02/2010   Qualifier: Diagnosis of  By: Angelena Form, MD, Harrell Gave    . PONV (postoperative nausea and vomiting)    after c section  . Presbyesophagus   . Stroke [facility])    "small stroke" 2010- no residual per pt    Past Surgical History:  Procedure Laterality Date  . CATARACT EXTRACTION, BILATERAL    . CESAREAN SECTION  1950-1960s   x 3  . CHOLECYSTECTOMY    . COLONOSCOPY N/A  12/19/2015   Procedure: COLONOSCOPY;  Surgeon: Manus Gunning, MD;  Location: [facility];  Service: Gastroenterology;  Laterality: N/A;  . COLONOSCOPY    . [place]  . FACIAL COSMETIC SURGERY    . LAPAROSCOPIC CHOLECYSTECTOMY SINGLE SITE WITH INTRAOPERATIVE CHOLANGIOGRAM N/A 10/27/2014   Procedure: LAPAROSCOPIC CHOLECYSTECTOMY SINGLE SITE WITH INTRAOPERATIVE CHOLANGIOGRAM;  Surgeon: Dawnell Bryant Boston, MD;  Location: [facility];  Service: General;  Laterality: N/A;  . TOTAL KNEE ARTHROPLASTY Left 03/11/2020   Procedure: TOTAL KNEE ARTHROPLASTY;  Surgeon: Paralee Cancel, MD;  Location: [facility];  Service: Orthopedics;  Laterality: Left;  70 mins  . TUBAL LIGATION    . UPPER GASTROINTESTINAL ENDOSCOPY      There were no vitals filed for this visit.   Subjective Assessment - 06/24/20 1321    Subjective I had a pretty good weekend, not much pain, I feel like I am walking better    Currently in Pain? Yes    Pain Score 2     Pain Location Knee    Pain Orientation Left    Aggravating Factors  sitting long periods it gets stiff  [facility] PT Assessment - 06/24/20 0001      PROM   Left Knee Flexion 112                         [facility] Adult PT Treatment/Exercise - 06/24/20 0001      Knee/Hip Exercises: Aerobic   Recumbent Bike 6 min    Nustep L5 x 6 min      Knee/Hip Exercises: Machines for Strengthening   Cybex Knee Extension 5# 2x15    Cybex Knee Flexion 25# 2x15    Cybex Leg Press 20# BLE 2x10, no weight working on flexion      Knee/Hip Exercises: Standing   Forward Step Up Both;2 sets;10 reps;Hand Hold: 1;Step Height: 6"    Walking with Sports Cord resisted gait all directions    Other Standing Knee Exercises 6" toe clears, 6" lunge stretch with PT overpressure    Other Standing Knee Exercises sit to stand, 2x10      Knee/Hip Exercises: Seated   Long Arc Quad Strengthening;Left;2 sets;10 reps;Weights    Long Arc Quad Weight 3  lbs.      Knee/Hip Exercises: Supine   Short Arc Quad Sets Left;2 sets;10 reps    Short Arc Quad Sets Limitations 3#      Manual Therapy   Manual Therapy Passive ROM    Joint Mobilization joint distraction with knee extension stretch seated    Soft tissue mobilization mobilization of scar tissue and into the quad    Passive ROM knee flex and ext                    PT Short Term Goals - 04/21/20 1356      PT SHORT TERM GOAL #1   Title Ind with intial HEP    Time 2    Period Weeks    Status Achieved             PT Long Term Goals - 06/03/20 1353      PT LONG TERM GOAL #2   Title decrease pain 50%    Status Partially Met      PT LONG TERM GOAL #3   Title increase left knee aROM to 5-115 degrees flexion    Status Partially Met      PT LONG TERM GOAL #4   Title walk with SPC or without device for all household mobility    Status Partially Met                 Plan - 06/24/20 1404    Clinical Impression Statement Patient reports that she did have a slip on the stairs and had a self bending of the knee this weekend.  She is stiff motly in the knee and not in the mms of the thigh or hip, she is continuing to progress iwht ROM but is slow    PT Next Visit Plan after this week I have decreased the frequency to 1x/week, will need to renew next week    Consulted and Agree with Plan of Care Patient           Patient will benefit from skilled therapeutic intervention in order to improve the following deficits and impairments:  Pain, Decreased strength, Decreased range of motion, Abnormal gait, Improper body mechanics, Increased muscle spasms, Decreased mobility, Cardiopulmonary status limiting activity, Postural dysfunction, Difficulty walking, Impaired flexibility, Decreased activity tolerance, Decreased balance, Increased edema  Visit Diagnosis: Stiffness of left knee,  not elsewhere classified  Acute pain of left knee  Difficulty in walking, not  elsewhere classified  Localized edema  Acute bilateral low back pain with left-sided sciatica     Problem List Patient Active Problem List   Diagnosis Date Noted  . Status post left knee replacement 03/14/2020  . S/P left TKA 03/11/2020  . Preoperative clearance 02/22/2019  . Colon polyp   . Lower GI bleed 12/18/2015  . Diverticulosis of colon with hemorrhage   . Nausea with vomiting 12/02/2014  . Ileus, postoperative ([place]) 11/04/2014  . Hyponatremia 11/04/2014  . Abdominal fluid collection   . Chest pain 10/26/2014  . Acute calculous cholecystitis 10/26/2014  . Barrett esophagus   . History of GI diverticular bleed   . Essential hypertension   . Dyspnea 01/18/2012  . Aortic insufficiency 01/18/2012  . ANEMIA, IRON DEFICIENCY 06/04/2010  . HYPERCHOLESTEROLEMIA 02/08/2008  . GERD 02/08/2008  . ARTHRITIS 02/08/2008  . HIATAL HERNIA 11/02/2007  . DIVERTICULOSIS OF COLON 04/07/2004    Sumner Boast., PT 06/24/2020, 2:08 PM  [address] [place], [place], [address] Phone: [phone]   Fax:  [phone]  Name: Terri Shah MRN: [id number] Date of Birth: 1934-11-13

## 2020-06-27 ENCOUNTER — Other Ambulatory Visit: Payer: Self-pay

## 2020-06-27 ENCOUNTER — Encounter: Payer: Self-pay | Admitting: Physical Therapy

## 2020-06-27 ENCOUNTER — Ambulatory Visit: Payer: Medicare HMO | Admitting: Physical Therapy

## 2020-06-27 DIAGNOSIS — R600 Localized edema: Secondary | ICD-10-CM | POA: Diagnosis not present

## 2020-06-27 DIAGNOSIS — R262 Difficulty in walking, not elsewhere classified: Secondary | ICD-10-CM | POA: Diagnosis not present

## 2020-06-27 DIAGNOSIS — M5442 Lumbago with sciatica, left side: Secondary | ICD-10-CM | POA: Diagnosis not present

## 2020-06-27 DIAGNOSIS — M25562 Pain in left knee: Secondary | ICD-10-CM

## 2020-06-27 DIAGNOSIS — M25662 Stiffness of left knee, not elsewhere classified: Secondary | ICD-10-CM

## 2020-06-27 NOTE — Therapy (Signed)
[place], [place], [address] Phone: [phone]   Fax:  [phone]  Physical Therapy Treatment  Patient Details  Name: Terri Shah MRN: [id number] Date of Birth: 12/09/1934 Referring Provider (PT): Alvan Dame   Encounter Date: 06/27/2020   PT End of Session - 06/27/20 1144    Visit Number 30    Date for PT Re-Evaluation 07/03/20    PT Start Time 1100    PT Stop Time 1152    PT Time Calculation (min) 52 min    Activity Tolerance Patient tolerated treatment well    Behavior During Therapy [facility] for tasks assessed/performed           Past Medical History:  Diagnosis Date  . Allergy   . Anemia   . Arthritis   . Barrett esophagus   . Barrett's esophagus 11/02/2007   Qualifier: Diagnosis of  By: Mat Carne    . Blood transfusion without reported diagnosis    late 20's or early 30's  . Cataract   . Diverticulosis   . ECTOPIC PREGNANCY 02/08/2008   Qualifier: Diagnosis of  By: Mat Carne    . Family history of adverse reaction to anesthesia    older sister dont remember what it was   . Fibromyalgia    takes elavil  . Gastritis   . GASTRITIS 11/02/2007   Qualifier: Diagnosis of  By: Mat Carne    . GERD (gastroesophageal reflux disease)   . Glaucoma    right eye  . Heart murmur   . Hiatal hernia   . History of GI diverticular bleed   . Hyperlipidemia   . Hypertension   . Iron deficiency 2020  . Iron deficiency anemia   . Osteoporosis   . Palpitations 04/02/2010   Qualifier: Diagnosis of  By: Angelena Form, MD, Harrell Gave    . PONV (postoperative nausea and vomiting)    after c section  . Presbyesophagus   . Stroke [facility])    "small stroke" 2010- no residual per pt    Past Surgical History:  Procedure Laterality Date  . CATARACT EXTRACTION, BILATERAL    . CESAREAN SECTION  1950-1960s   x 3  . CHOLECYSTECTOMY    . COLONOSCOPY N/A 12/19/2015   Procedure: COLONOSCOPY;   Surgeon: Manus Gunning, MD;  Location: [facility];  Service: Gastroenterology;  Laterality: N/A;  . COLONOSCOPY    . [place]  . FACIAL COSMETIC SURGERY    . LAPAROSCOPIC CHOLECYSTECTOMY SINGLE SITE WITH INTRAOPERATIVE CHOLANGIOGRAM N/A 10/27/2014   Procedure: LAPAROSCOPIC CHOLECYSTECTOMY SINGLE SITE WITH INTRAOPERATIVE CHOLANGIOGRAM;  Surgeon: Michael Boston, MD;  [facility];  Service: General;  Laterality: N/A;  . TOTAL KNEE ARTHROPLASTY Left 03/11/2020   Procedure: TOTAL KNEE ARTHROPLASTY;  Surgeon: Paralee Cancel, MD;  [facility];  Service: Orthopedics;  Laterality: Left;  70 mins  . TUBAL LIGATION    . UPPER GASTROINTESTINAL ENDOSCOPY      There were no vitals filed for this visit.   Subjective Assessment - 06/27/20 1103    Subjective Pt states she is feeling better today; reports a little sore in hip adductors after slipping earlier this week    Currently in Pain? Yes    Pain Score 3     Pain Location Knee    Pain Orientation Left  [facility] Adult PT Treatment/Exercise - 06/27/20 0001      Knee/Hip Exercises: Aerobic   Recumbent Bike 4 min    Nustep L5 x 6 min      Knee/Hip Exercises: Machines for Strengthening   Cybex Knee Extension 5# 2x15    Cybex Knee Flexion 20#, 15# 2x15      Knee/Hip Exercises: Standing   Heel Raises Both;15 reps;2 sets    Other Standing Knee Exercises sidestepping with red TB; resisted gait fwd/bkwd 30# x4      Modalities   Modalities Cryotherapy      Moist Heat Therapy   Number Minutes Moist Heat 10 Minutes    Moist Heat Location Lumbar Spine      Cryotherapy   Number Minutes Cryotherapy 10 Minutes    Cryotherapy Location Knee    Type of Cryotherapy Ice pack                    PT Short Term Goals - 04/21/20 1356      PT SHORT TERM GOAL #1   Title Ind with intial HEP    Time 2    Period Weeks    Status Achieved             PT  Long Term Goals - 06/27/20 1110      PT LONG TERM GOAL #2   Title decrease pain 50%    Status Partially Met      PT LONG TERM GOAL #3   Title increase left knee aROM to 5-115 degrees flexion    Baseline 112    Status Partially Met      PT LONG TERM GOAL #4   Title walk with SPC or without device for all household mobility    Status Achieved                 Plan - 06/27/20 1144    Clinical Impression Statement Pt is progressing with goals; able to ambulate without SPC for all household mobility tasks, progressing knee flexion ROM, and increasing functional activities outside of the house. Did well with progression of TE today. Required CGA for resisted gait d/t occasional LOB.    PT Treatment/Interventions ADLs/Self Care Home Management;Cryotherapy;Electrical Stimulation;Moist Heat;Ultrasound;Therapeutic activities;Therapeutic exercise;Neuromuscular re-education;Manual techniques;Patient/family education;Vasopneumatic Device;Functional mobility training;Stair training;Gait training    PT Next Visit Plan after this week I have decreased the frequency to 1x/week, will need to renew next week    Consulted and Agree with Plan of Care Patient           Patient will benefit from skilled therapeutic intervention in order to improve the following deficits and impairments:  Pain, Decreased strength, Decreased range of motion, Abnormal gait, Improper body mechanics, Increased muscle spasms, Decreased mobility, Cardiopulmonary status limiting activity, Postural dysfunction, Difficulty walking, Impaired flexibility, Decreased activity tolerance, Decreased balance, Increased edema  Visit Diagnosis: Stiffness of left knee, not elsewhere classified  Acute pain of left knee  Difficulty in walking, not elsewhere classified  Localized edema  Acute bilateral low back pain with left-sided sciatica     Problem List Patient Active Problem List   Diagnosis Date Noted  . Status post left  knee replacement 03/14/2020  . S/P left TKA 03/11/2020  . Preoperative clearance 02/22/2019  . Colon polyp   . Lower GI bleed 12/18/2015  . Diverticulosis of colon with hemorrhage   . Nausea with vomiting 12/02/2014  . Ileus, postoperative ([place]) 11/04/2014  . Hyponatremia 11/04/2014  . Abdominal fluid  collection   . Chest pain 10/26/2014  . Acute calculous cholecystitis 10/26/2014  . Barrett esophagus   . History of GI diverticular bleed   . Essential hypertension   . Dyspnea 01/18/2012  . Aortic insufficiency 01/18/2012  . ANEMIA, IRON DEFICIENCY 06/04/2010  . HYPERCHOLESTEROLEMIA 02/08/2008  . GERD 02/08/2008  . ARTHRITIS 02/08/2008  . HIATAL HERNIA 11/02/2007  . DIVERTICULOSIS OF COLON 04/07/2004   Amador Cunas, PT, DPT Donald Prose Kinga Cassar 06/27/2020, 11:47 AM  [place] [address] [place], [place], [address] Phone: [phone]   Fax:  [phone]  Name: Terri Shah MRN: [id number] Date of Birth: 30-May-1934

## 2020-06-30 ENCOUNTER — Encounter: Payer: Self-pay | Admitting: Physical Therapy

## 2020-06-30 ENCOUNTER — Other Ambulatory Visit: Payer: Self-pay

## 2020-06-30 ENCOUNTER — Ambulatory Visit: Payer: Medicare HMO | Admitting: Physical Therapy

## 2020-06-30 DIAGNOSIS — R600 Localized edema: Secondary | ICD-10-CM

## 2020-06-30 DIAGNOSIS — M25662 Stiffness of left knee, not elsewhere classified: Secondary | ICD-10-CM

## 2020-06-30 DIAGNOSIS — M5442 Lumbago with sciatica, left side: Secondary | ICD-10-CM

## 2020-06-30 DIAGNOSIS — R262 Difficulty in walking, not elsewhere classified: Secondary | ICD-10-CM | POA: Diagnosis not present

## 2020-06-30 DIAGNOSIS — M25562 Pain in left knee: Secondary | ICD-10-CM | POA: Diagnosis not present

## 2020-06-30 NOTE — Therapy (Signed)
[place], [place], [address] Phone: [phone]   Fax:  [phone]  Physical Therapy Treatment  Patient Details  Name: Terri Shah MRN: [id number] Date of Birth: Sep 12, 1934 Referring Provider (PT): Cathe Mons Date: 06/30/2020   PT End of Session - 06/30/20 1346    Visit Number 31    Date for PT Re-Evaluation 07/03/20    Authorization Type [employer]na Medicare    PT Start Time 1310    PT Stop Time 1403    PT Time Calculation (min) 53 min    Activity Tolerance Patient tolerated treatment well    Behavior During Therapy [facility] for tasks assessed/performed           Past Medical History:  Diagnosis Date  . Allergy   . Anemia   . Arthritis   . Barrett esophagus   . Barrett's esophagus 11/02/2007   Qualifier: Diagnosis of  By: Mat Carne    . Blood transfusion without reported diagnosis    late 20's or early 30's  . Cataract   . Diverticulosis   . ECTOPIC PREGNANCY 02/08/2008   Qualifier: Diagnosis of  By: Mat Carne    . Family history of adverse reaction to anesthesia    older sister dont remember what it was   . Fibromyalgia    takes elavil  . Gastritis   . GASTRITIS 11/02/2007   Qualifier: Diagnosis of  By: Mat Carne    . GERD (gastroesophageal reflux disease)   . Glaucoma    right eye  . Heart murmur   . Hiatal hernia   . History of GI diverticular bleed   . Hyperlipidemia   . Hypertension   . Iron deficiency 2020  . Iron deficiency anemia   . Osteoporosis   . Palpitations 04/02/2010   Qualifier: Diagnosis of  By: Angelena Form, MD, Harrell Gave    . PONV (postoperative nausea and vomiting)    after c section  . Presbyesophagus   . Stroke [facility])    "small stroke" 2010- no residual per pt    Past Surgical History:  Procedure Laterality Date  . CATARACT EXTRACTION, BILATERAL    . CESAREAN SECTION  1950-1960s   x 3  . CHOLECYSTECTOMY    . COLONOSCOPY N/A  12/19/2015   Procedure: COLONOSCOPY;  Surgeon: Manus Gunning, MD;  Location: [facility];  Service: Gastroenterology;  Laterality: N/A;  . COLONOSCOPY    . [place]  . FACIAL COSMETIC SURGERY    . LAPAROSCOPIC CHOLECYSTECTOMY SINGLE SITE WITH INTRAOPERATIVE CHOLANGIOGRAM N/A 10/27/2014   Procedure: LAPAROSCOPIC CHOLECYSTECTOMY SINGLE SITE WITH INTRAOPERATIVE CHOLANGIOGRAM;  Surgeon: Miller Ed[place]ton Boston, MD;  [facility];  Service: General;  Laterality: N/A;  . TOTAL KNEE ARTHROPLASTY Left 03/11/2020   Procedure: TOTAL KNEE ARTHROPLASTY;  Surgeon: Paralee Cancel, MD;  [facility];  Service: Orthopedics;  Laterality: Left;  70 mins  . TUBAL LIGATION    . UPPER GASTROINTESTINAL ENDOSCOPY      There were no vitals filed for this visit.   Subjective Assessment - 06/30/20 1316    Subjective I was sore after the last treatment.  I just don't know which machine or exercise does it    Currently in Pain? Yes    Pain Score 3     Pain Location Knee    Pain Orientation Left    Pain Descriptors / Indicators Tightness;Sore  Pain Relieving Factors I think I am dpoing better              [facility] PT Assessment - 06/30/20 0001      AROM   Left Knee Flexion 106   after stretch                        [facility] Adult PT Treatment/Exercise - 06/30/20 0001      Ambulation/Gait   Gait Comments stairs step over step up and down, lunge step stretch for flexion, fast walking in the hall      Knee/Hip Exercises: Aerobic   Recumbent Bike 6 min    Nustep L5 x 5 min      Knee/Hip Exercises: Machines for Strengthening   Cybex Knee Extension 5# 2x15    Cybex Knee Flexion 20#, 15# 2x15    Cybex Leg Press 20# BLE 2x10, no weight working on flexion      Vasopneumatic   Number Minutes Vasopneumatic  10 minutes    Vasopnuematic Location  Knee    Vasopneumatic Pressure Medium    Vasopneumatic Temperature  35      Manual Therapy   Manual Therapy Passive  ROM    Joint Mobilization joint distraction with knee extension stretch seated    Soft tissue mobilization mobilization of scar tissue and into the quad    Passive ROM knee flex and ext                    PT Short Term Goals - 04/21/20 1356      PT SHORT TERM GOAL #1   Title Ind with intial HEP    Time 2    Period Weeks    Status Achieved             PT Long Term Goals - 06/30/20 1348      PT LONG TERM GOAL #1   Title independent with RICE    Status Achieved      PT LONG TERM GOAL #2   Title decrease pain 50%    Status Partially Met      PT LONG TERM GOAL #3   Title increase left knee aROM to 5-115 degrees flexion    Status Partially Met                 Plan - 06/30/20 1346    Clinical Impression Statement Patient continues to improve the ROM and the mobility.  She is still stiff at times and the back does limit her in some instances.  I went over her HEP and how to do on her own, we will decrease to 1x/week and see how we are doing    PT Next Visit Plan 1x/week and assure that she is doing stretches on her own, my concern is the stiffness that she has had    Consulted and Agree with Plan of Care Patient           Patient will benefit from skilled therapeutic intervention in order to improve the following deficits and impairments:  Pain, Decreased strength, Decreased range of motion, Abnormal gait, Improper body mechanics, Increased muscle spasms, Decreased mobility, Cardiopulmonary status limiting activity, Postural dysfunction, Difficulty walking, Impaired flexibility, Decreased activity tolerance, Decreased balance, Increased edema  Visit Diagnosis: Stiffness of left knee, not elsewhere classified  Acute pain of left knee  Difficulty in walking, not elsewhere classified  Localized edema  Acute bilateral low back pain with  left-sided sciatica     Problem List Patient Active Problem List   Diagnosis Date Noted  . Status post left knee  replacement 03/14/2020  . S/P left TKA 03/11/2020  . Preoperative clearance 02/22/2019  . Colon polyp   . Lower GI bleed 12/18/2015  . Diverticulosis of colon with hemorrhage   . Nausea with vomiting 12/02/2014  . Ileus, postoperative ([place]) 11/04/2014  . Hyponatremia 11/04/2014  . Abdominal fluid collection   . Chest pain 10/26/2014  . Acute calculous cholecystitis 10/26/2014  . Barrett esophagus   . History of GI diverticular bleed   . Essential hypertension   . Dyspnea 01/18/2012  . Aortic insufficiency 01/18/2012  . ANEMIA, IRON DEFICIENCY 06/04/2010  . HYPERCHOLESTEROLEMIA 02/08/2008  . GERD 02/08/2008  . ARTHRITIS 02/08/2008  . HIATAL HERNIA 11/02/2007  . DIVERTICULOSIS OF COLON 04/07/2004    Sumner Boast., PT 06/30/2020, 1:49 PM  [place] [address] [place], [place], [address] Phone: [phone]   Fax:  [phone]  Name: Terri Shah MRN: [id number] Date of Birth: May 04, 1934

## 2020-07-03 ENCOUNTER — Encounter: Payer: Medicare HMO | Admitting: Physical Therapy

## 2020-07-08 ENCOUNTER — Ambulatory Visit: Payer: Medicare HMO | Admitting: Physical Therapy

## 2020-07-08 ENCOUNTER — Other Ambulatory Visit: Payer: Self-pay

## 2020-07-08 ENCOUNTER — Encounter: Payer: Self-pay | Admitting: Physical Therapy

## 2020-07-08 DIAGNOSIS — M25562 Pain in left knee: Secondary | ICD-10-CM | POA: Diagnosis not present

## 2020-07-08 DIAGNOSIS — R600 Localized edema: Secondary | ICD-10-CM

## 2020-07-08 DIAGNOSIS — M5442 Lumbago with sciatica, left side: Secondary | ICD-10-CM | POA: Diagnosis not present

## 2020-07-08 DIAGNOSIS — R262 Difficulty in walking, not elsewhere classified: Secondary | ICD-10-CM

## 2020-07-08 DIAGNOSIS — M25662 Stiffness of left knee, not elsewhere classified: Secondary | ICD-10-CM

## 2020-07-08 NOTE — Therapy (Signed)
[facility] [place], [place], [address] Phone: [phone]   Fax:  [phone]  Physical Therapy Treatment  Patient Details  Name: Terri Shah MRN: [id number] Date of Birth: 10-19-1934 Referring Provider (PT): Cathe Mons Date: 07/08/2020   PT End of Session - 07/08/20 1607    Visit Number 32    Date for PT Re-Evaluation 08/08/20    Authorization Type [employer]na Medicare    PT Start Time 1315    PT Stop Time 1403    PT Time Calculation (min) 48 min    Activity Tolerance Patient tolerated treatment well    Behavior During Therapy [facility] for tasks assessed/performed           Past Medical History:  Diagnosis Date  . Allergy   . Anemia   . Arthritis   . Barrett esophagus   . Barrett's esophagus 11/02/2007   Qualifier: Diagnosis of  By: Mat Carne    . Blood transfusion without reported diagnosis    late 20's or early 30's  . Cataract   . Diverticulosis   . ECTOPIC PREGNANCY 02/08/2008   Qualifier: Diagnosis of  By: Mat Carne    . Family history of adverse reaction to anesthesia    older sister dont remember what it was   . Fibromyalgia    takes elavil  . Gastritis   . GASTRITIS 11/02/2007   Qualifier: Diagnosis of  By: Mat Carne    . GERD (gastroesophageal reflux disease)   . Glaucoma    right eye  . Heart murmur   . Hiatal hernia   . History of GI diverticular bleed   . Hyperlipidemia   . Hypertension   . Iron deficiency 2020  . Iron deficiency anemia   . Osteoporosis   . Palpitations 04/02/2010   Qualifier: Diagnosis of  By: Angelena Form, MD, Harrell Gave    . PONV (postoperative nausea and vomiting)    after c section  . Presbyesophagus   . Stroke [facility])    "small stroke" 2010- no residual per pt    Past Surgical History:  Procedure Laterality Date  . CATARACT EXTRACTION, BILATERAL    . CESAREAN SECTION  1950-1960s   x 3  . CHOLECYSTECTOMY    . COLONOSCOPY N/A  12/19/2015   Procedure: COLONOSCOPY;  Surgeon: Manus Gunning, MD;  Location: [facility];  Service: Gastroenterology;  Laterality: N/A;  . COLONOSCOPY    . [place]  . FACIAL COSMETIC SURGERY    . LAPAROSCOPIC CHOLECYSTECTOMY SINGLE SITE WITH INTRAOPERATIVE CHOLANGIOGRAM N/A 10/27/2014   Procedure: LAPAROSCOPIC CHOLECYSTECTOMY SINGLE SITE WITH INTRAOPERATIVE CHOLANGIOGRAM;  Surgeon: Waylynn Benefiel Boston, MD;  Location: [facility];  Service: General;  Laterality: N/A;  . TOTAL KNEE ARTHROPLASTY Left 03/11/2020   Procedure: TOTAL KNEE ARTHROPLASTY;  Surgeon: Paralee Cancel, MD;  Location: [facility];  Service: Orthopedics;  Laterality: Left;  70 mins  . TUBAL LIGATION    . UPPER GASTROINTESTINAL ENDOSCOPY      There were no vitals filed for this visit.   Subjective Assessment - 07/08/20 1321    Subjective [name] continues to have some knee pain and some back pain "just does not seem to get better"    Currently in Pain? Yes    Pain Score 3     Pain Location Knee    Pain Orientation Left  [facility] PT Assessment - 07/08/20 0001      Assessment   Medical Diagnosis s/p left TKA    Referring Provider (PT) Alvan Dame      AROM   Left Knee Extension 5    Left Knee Flexion 108                         [facility] Adult PT Treatment/Exercise - 07/08/20 0001      Lumbar Exercises: Stretches   Passive Hamstring Stretch Right;Left;4 reps;10 seconds    Single Knee to Chest Stretch Right;Left;4 reps;10 seconds    Double Knee to Chest Stretch 4 reps;10 seconds    Lower Trunk Rotation 4 reps;10 seconds      Knee/Hip Exercises: Aerobic   Recumbent Bike 6 min    Nustep L5 x 5 min      Knee/Hip Exercises: Machines for Strengthening   Cybex Knee Extension 5# 2x15    Cybex Knee Flexion 20#, 15# 2x15    Cybex Leg Press 20# BLE 2x10, no weight working on flexion      Manual Therapy   Manual Therapy Passive ROM    Joint Mobilization joint distraction with  knee extension stretch seated    Soft tissue mobilization mobilization of scar tissue and into the quad    Passive ROM knee flex and ext                    PT Short Term Goals - 04/21/20 1356      PT SHORT TERM GOAL #1   Title Ind with intial HEP    Time 2    Period Weeks    Status Achieved             PT Long Term Goals - 06/30/20 1348      PT LONG TERM GOAL #1   Title independent with RICE    Status Achieved      PT LONG TERM GOAL #2   Title decrease pain 50%    Status Partially Met      PT LONG TERM GOAL #3   Title increase left knee aROM to 5-115 degrees flexion    Status Partially Met                 Plan - 07/08/20 1609    Clinical Impression Statement The knee ROM and walking is going well, has had some increase in ROM, has some increased left low back pain.  She is tender in the low back, she is walking without device all distances.  I am talking with her about decreaing to 1x/week and maybe trying more at home and or trying to go to the gym    PT Next Visit Plan 1x/week and assure that she is doing stretches on her own, my concern is the stiffness that she has had    Consulted and Agree with Plan of Care Patient           Patient will benefit from skilled therapeutic intervention in order to improve the following deficits and impairments:  Pain, Decreased strength, Decreased range of motion, Abnormal gait, Improper body mechanics, Increased muscle spasms, Decreased mobility, Cardiopulmonary status limiting activity, Postural dysfunction, Difficulty walking, Impaired flexibility, Decreased activity tolerance, Decreased balance, Increased edema  Visit Diagnosis: Stiffness of left knee, not elsewhere classified - Plan: PT plan of care cert/re-cert  Acute pain of left knee - Plan: PT plan of care cert/re-cert  Difficulty in walking,  not elsewhere classified - Plan: PT plan of care cert/re-cert  Localized edema - Plan: PT plan of care  cert/re-cert  Acute bilateral low back pain with left-sided sciatica - Plan: PT plan of care cert/re-cert     Problem List Patient Active Problem List   Diagnosis Date Noted  . Status post left knee replacement 03/14/2020  . S/P left TKA 03/11/2020  . Preoperative clearance 02/22/2019  . Colon polyp   . Lower GI bleed 12/18/2015  . Diverticulosis of colon with hemorrhage   . Nausea with vomiting 12/02/2014  . Ileus, postoperative ([place]) 11/04/2014  . Hyponatremia 11/04/2014  . Abdominal fluid collection   . Chest pain 10/26/2014  . Acute calculous cholecystitis 10/26/2014  . Barrett esophagus   . History of GI diverticular bleed   . Essential hypertension   . Dyspnea 01/18/2012  . Aortic insufficiency 01/18/2012  . ANEMIA, IRON DEFICIENCY 06/04/2010  . HYPERCHOLESTEROLEMIA 02/08/2008  . GERD 02/08/2008  . ARTHRITIS 02/08/2008  . HIATAL HERNIA 11/02/2007  . DIVERTICULOSIS OF COLON 04/07/2004    Sumner Boast., PT 07/08/2020, 4:13 PM  [address] [place], [place], [address] Phone: [phone]   Fax:  [phone]  Name: Terri Shah MRN: [id number] Date of Birth: 03-14-1934

## 2020-07-15 ENCOUNTER — Encounter: Payer: Self-pay | Admitting: Physical Therapy

## 2020-07-15 ENCOUNTER — Ambulatory Visit: Payer: Medicare HMO | Admitting: Physical Therapy

## 2020-07-15 ENCOUNTER — Other Ambulatory Visit: Payer: Self-pay

## 2020-07-15 DIAGNOSIS — M25562 Pain in left knee: Secondary | ICD-10-CM

## 2020-07-15 DIAGNOSIS — M25662 Stiffness of left knee, not elsewhere classified: Secondary | ICD-10-CM

## 2020-07-15 DIAGNOSIS — R600 Localized edema: Secondary | ICD-10-CM | POA: Diagnosis not present

## 2020-07-15 DIAGNOSIS — M5442 Lumbago with sciatica, left side: Secondary | ICD-10-CM | POA: Diagnosis not present

## 2020-07-15 DIAGNOSIS — R262 Difficulty in walking, not elsewhere classified: Secondary | ICD-10-CM | POA: Diagnosis not present

## 2020-07-15 NOTE — Therapy (Signed)
[facility] [place], [place], [address] Phone: [phone]   Fax:  [phone]  Physical Therapy Treatment  Patient Details  Name: Terri Shah MRN: [id number] Date of Birth: [birth year] Referring Provider (PT): Cathe Mons Date: 07/15/2020   PT End of Session - 07/15/20 1348    Visit Number 33    Date for PT Re-Evaluation 08/08/20    Authorization Type Aetna Medicare    PT Start Time 1314    PT Stop Time 1404    PT Time Calculation (min) 50 min    Activity Tolerance Patient tolerated treatment well    Behavior During Therapy [facility] for tasks assessed/performed           Past Medical History:  Diagnosis Date  . Allergy   . Anemia   . Arthritis   . Barrett esophagus   . Barrett's esophagus 11/02/2007   Qualifier: Diagnosis of  By: Mat Carne    . Blood transfusion without reported diagnosis    late 20's or early 30's  . Cataract   . Diverticulosis   . ECTOPIC PREGNANCY 02/08/2008   Qualifier: Diagnosis of  By: Mat Carne    . Family history of adverse reaction to anesthesia    older sister dont remember what it was   . Fibromyalgia    takes elavil  . Gastritis   . GASTRITIS 11/02/2007   Qualifier: Diagnosis of  By: Mat Carne    . GERD (gastroesophageal reflux disease)   . Glaucoma    right eye  . Heart murmur   . Hiatal hernia   . History of GI diverticular bleed   . Hyperlipidemia   . Hypertension   . Iron deficiency 2020  . Iron deficiency anemia   . Osteoporosis   . Palpitations 04/02/2010   Qualifier: Diagnosis of  By: Angelena Form, MD, Harrell Gave    . PONV (postoperative nausea and vomiting)    after c section  . Presbyesophagus   . Stroke [facility])    "small stroke" 2010- no residual per pt    Past Surgical History:  Procedure Laterality Date  . CATARACT EXTRACTION, BILATERAL    . CESAREAN SECTION  1950-1960s   x 3  . CHOLECYSTECTOMY    . COLONOSCOPY N/A  12/19/2015   Procedure: COLONOSCOPY;  Surgeon: Manus Gunning, MD;  Location: [facility];  Service: Gastroenterology;  Laterality: N/A;  . COLONOSCOPY    . [place]  . FACIAL COSMETIC SURGERY    . LAPAROSCOPIC CHOLECYSTECTOMY SINGLE SITE WITH INTRAOPERATIVE CHOLANGIOGRAM N/A 10/27/2014   Procedure: LAPAROSCOPIC CHOLECYSTECTOMY SINGLE SITE WITH INTRAOPERATIVE CHOLANGIOGRAM;  Surgeon: Ricardo Kayes Boston, MD;  [facility];  Service: General;  Laterality: N/A;  . TOTAL KNEE ARTHROPLASTY Left 03/11/2020   Procedure: TOTAL KNEE ARTHROPLASTY;  Surgeon: Paralee Cancel, MD;  [facility];  Service: Orthopedics;  Laterality: Left;  70 mins  . TUBAL LIGATION    . UPPER GASTROINTESTINAL ENDOSCOPY      There were no vitals filed for this visit.   Subjective Assessment - 07/15/20 1314    Subjective Patient with c/o back pain today.  Reports that she is doing a little better and feeling a little more confident    Currently in Pain? Yes    Pain Score 3     Pain Location Back    Pain Orientation Lower    Aggravating Factors  standing  [facility] PT Assessment - 07/15/20 0001      AROM   Left Knee Flexion 109      PROM   Left Knee Flexion 114                         [facility] Adult PT Treatment/Exercise - 07/15/20 0001      Ambulation/Gait   Gait Comments stairs step over step up and down, lunge step stretch for flexion, fast walking in the hall, backward walking      Knee/Hip Exercises: Aerobic   Recumbent Bike 6 min    Nustep L5 x 5 min      Knee/Hip Exercises: Machines for Strengthening   Cybex Knee Extension 5# 2x15    Cybex Knee Flexion 20#, 15# 2x15    Cybex Leg Press 20# BLE 2x10, no weight working on flexion      Moist Heat Therapy   Number Minutes Moist Heat 10 Minutes    Moist Heat Location Lumbar Spine      Cryotherapy   Number Minutes Cryotherapy 10 Minutes    Cryotherapy Location Knee    Type of Cryotherapy  Ice pack      Electrical Stimulation   Electrical Stimulation Location lumbar area    Electrical Stimulation Action IFC    Electrical Stimulation Parameters supine    Electrical Stimulation Goals Pain      Manual Therapy   Manual Therapy Passive ROM    Joint Mobilization joint distraction with knee extension stretch seated    Soft tissue mobilization mobilization of scar tissue and into the quad    Passive ROM knee flex and ext                    PT Short Term Goals - 04/21/20 1356      PT SHORT TERM GOAL #1   Title Ind with intial HEP    Time 2    Period Weeks    Status Achieved             PT Shah Term Goals - 07/15/20 1351      PT Shah TERM GOAL #2   Title decrease pain 50%    Status Partially Met      PT Shah TERM GOAL #3   Title increase left knee aROM to 5-115 degrees flexion    Status Partially Met      PT Shah TERM GOAL #4   Title walk with SPC or without device for all household mobility    Status Achieved                 Plan - 07/15/20 1349    Clinical Impression Statement Patient continues to gain a little ROM, she is stiff at times and is stiff in the low back with pain in the low back, the LBP is an ongoing issue.  She is walking all distances without device and with minimal deviation.    PT Next Visit Plan continue about 2 more visits and have her do more and more at home and gym    Consulted and Agree with Plan of Care Patient           Patient will benefit from skilled therapeutic intervention in order to improve the following deficits and impairments:  Pain, Decreased strength, Decreased range of motion, Abnormal gait, Improper body mechanics, Increased muscle spasms, Decreased mobility, Cardiopulmonary status limiting activity, Postural dysfunction, Difficulty walking, Impaired flexibility, Decreased activity  tolerance, Decreased balance, Increased edema  Visit Diagnosis: Stiffness of left knee, not elsewhere  classified  Acute pain of left knee  Difficulty in walking, not elsewhere classified  Acute bilateral low back pain with left-sided sciatica     Problem List Patient Active Problem List   Diagnosis Date Noted  . Status post left knee replacement 03/14/2020  . S/P left TKA 03/11/2020  . Preoperative clearance 02/22/2019  . Colon polyp   . Lower GI bleed 12/18/2015  . Diverticulosis of colon with hemorrhage   . Nausea with vomiting 12/02/2014  . Ileus, postoperative ([place]) 11/04/2014  . Hyponatremia 11/04/2014  . Abdominal fluid collection   . Chest pain 10/26/2014  . Acute calculous cholecystitis 10/26/2014  . Barrett esophagus   . History of GI diverticular bleed   . Essential hypertension   . Dyspnea 01/18/2012  . Aortic insufficiency 01/18/2012  . ANEMIA, IRON DEFICIENCY 06/04/2010  . HYPERCHOLESTEROLEMIA 02/08/2008  . GERD 02/08/2008  . ARTHRITIS 02/08/2008  . HIATAL HERNIA 11/02/2007  . DIVERTICULOSIS OF COLON 04/07/2004    Sumner Boast., PT 07/15/2020, 1:52 PM  [address] [place], [place], [address] Phone: [phone]   Fax:  [phone]  Name: Terri Shah MRN: [id number] Date of Birth: 04/14/1934

## 2020-07-16 ENCOUNTER — Encounter (HOSPITAL_COMMUNITY): Payer: Medicare HMO

## 2020-07-22 ENCOUNTER — Encounter: Payer: Self-pay | Admitting: Physical Therapy

## 2020-07-22 ENCOUNTER — Other Ambulatory Visit: Payer: Self-pay

## 2020-07-22 ENCOUNTER — Ambulatory Visit: Payer: Medicare HMO | Attending: Physical Medicine and Rehabilitation | Admitting: Physical Therapy

## 2020-07-22 DIAGNOSIS — M25662 Stiffness of left knee, not elsewhere classified: Secondary | ICD-10-CM

## 2020-07-22 DIAGNOSIS — M25562 Pain in left knee: Secondary | ICD-10-CM | POA: Diagnosis not present

## 2020-07-22 DIAGNOSIS — M5442 Lumbago with sciatica, left side: Secondary | ICD-10-CM

## 2020-07-22 DIAGNOSIS — R262 Difficulty in walking, not elsewhere classified: Secondary | ICD-10-CM | POA: Diagnosis not present

## 2020-07-22 DIAGNOSIS — R600 Localized edema: Secondary | ICD-10-CM | POA: Insufficient documentation

## 2020-07-22 NOTE — Therapy (Signed)
[facility] [place] [place], [place], [address] Phone: [phone]   Fax:  [phone]  Physical Therapy Treatment  Patient Details  Name: Terri Shah MRN: [id number] Date of Birth: August 09, 1934 Referring Provider (PT): Cathe Mons Date: 07/22/2020   PT End of Session - 07/22/20 1428    Visit Number 34    Date for PT Re-Evaluation 08/08/20    Authorization Type Aetna Medicare    PT Start Time 1315    PT Stop Time 1406    PT Time Calculation (min) 51 min    Activity Tolerance Patient tolerated treatment well    Behavior During Therapy [facility] for tasks assessed/performed           Past Medical History:  Diagnosis Date  . Allergy   . Anemia   . Arthritis   . Barrett esophagus   . Barrett's esophagus 11/02/2007   Qualifier: Diagnosis of  By: Mat Carne    . Blood transfusion without reported diagnosis    late 20's or early 30's  . Cataract   . Diverticulosis   . ECTOPIC PREGNANCY 02/08/2008   Qualifier: Diagnosis of  By: Mat Carne    . Family history of adverse reaction to anesthesia    older sister dont remember what it was   . Fibromyalgia    takes elavil  . Gastritis   . GASTRITIS 11/02/2007   Qualifier: Diagnosis of  By: Mat Carne    . GERD (gastroesophageal reflux disease)   . Glaucoma    right eye  . Heart murmur   . Hiatal hernia   . History of GI diverticular bleed   . Hyperlipidemia   . Hypertension   . Iron deficiency 2020  . Iron deficiency anemia   . Osteoporosis   . Palpitations 04/02/2010   Qualifier: Diagnosis of  By: Angelena Form, MD, Harrell Gave    . PONV (postoperative nausea and vomiting)    after c section  . Presbyesophagus   . Stroke [facility])    "small stroke" 2010- no residual per pt    Past Surgical History:  Procedure Laterality Date  . CATARACT EXTRACTION, BILATERAL    . CESAREAN SECTION  1950-1960s   x 3  . CHOLECYSTECTOMY    . COLONOSCOPY N/A  12/19/2015   Procedure: COLONOSCOPY;  Surgeon: Manus Gunning, MD;  Location: [facility];  Service: Gastroenterology;  Laterality: N/A;  . COLONOSCOPY    . [place]  . FACIAL COSMETIC SURGERY    . LAPAROSCOPIC CHOLECYSTECTOMY SINGLE SITE WITH INTRAOPERATIVE CHOLANGIOGRAM N/A 10/27/2014   Procedure: LAPAROSCOPIC CHOLECYSTECTOMY SINGLE SITE WITH INTRAOPERATIVE CHOLANGIOGRAM;  Surgeon: Michael Boston, MD;  [facility];  Service: General;  Laterality: N/A;  . TOTAL KNEE ARTHROPLASTY Left 03/11/2020   Procedure: TOTAL KNEE ARTHROPLASTY;  Surgeon: Paralee Cancel, MD;  [facility];  Service: Orthopedics;  Laterality: Left;  70 mins  . TUBAL LIGATION    . UPPER GASTROINTESTINAL ENDOSCOPY      There were no vitals filed for this visit.   Subjective Assessment - 07/22/20 1314    Subjective Pt states LB is hurting. Pt states knee is doing well today.    Currently in Pain? Yes    Pain Score 2     Pain Location Back  [facility] Adult PT Treatment/Exercise - 07/22/20 0001      Knee/Hip Exercises: Stretches   Gastroc Stretch Both;1 rep;30 seconds    Soleus Stretch Both;1 rep;30 seconds      Knee/Hip Exercises: Aerobic   Nustep L5 x 5 min      Knee/Hip Exercises: Machines for Strengthening   Cybex Knee Extension 10# 2x15    Cybex Knee Flexion 20# 2x15    Cybex Leg Press 20# BLE 2x10      Knee/Hip Exercises: Standing   Heel Raises Both;15 reps;1 set      Moist Heat Therapy   Number Minutes Moist Heat 15 Minutes    Moist Heat Location Lumbar Spine      Cryotherapy   Number Minutes Cryotherapy 10 Minutes    Cryotherapy Location Knee    Type of Cryotherapy Ice pack      Electrical Stimulation   Electrical Stimulation Location lumbar area    Electrical Stimulation Action IFC    Electrical Stimulation Parameters supine    Electrical Stimulation Goals Pain      Manual Therapy   Manual Therapy Passive  ROM    Joint Mobilization joint distraction with knee extension stretch seated    Soft tissue mobilization mobilization of scar tissue and into the quad    Passive ROM knee flex and ext                    PT Short Term Goals - 04/21/20 1356      PT SHORT TERM GOAL #1   Title Ind with intial HEP    Time 2    Period Weeks    Status Achieved             PT Long Term Goals - 07/15/20 1351      PT LONG TERM GOAL #2   Title decrease pain 50%    Status Partially Met      PT LONG TERM GOAL #3   Title increase left knee aROM to 5-115 degrees flexion    Status Partially Met      PT LONG TERM GOAL #4   Title walk with SPC or without device for all household mobility    Status Achieved                 Plan - 07/22/20 1430    Clinical Impression Statement Performed pt stretching of L knee with estim and MHP on LB d/t pt reports of increased LBP this rx. Pt did well with machine level interventions; some complaints of LBP with prolonged sitting this rx. Did well with manual knee stretching and ROM.    PT Treatment/Interventions ADLs/Self Care Home Management;Cryotherapy;Electrical Stimulation;Moist Heat;Ultrasound;Therapeutic activities;Therapeutic exercise;Neuromuscular re-education;Manual techniques;Patient/family education;Vasopneumatic Device;Functional mobility training;Stair training;Gait training    PT Next Visit Plan continue about 2 more visits and have her do more and more at home and gym    Consulted and Agree with Plan of Care Patient           Patient will benefit from skilled therapeutic intervention in order to improve the following deficits and impairments:  Pain, Decreased strength, Decreased range of motion, Abnormal gait, Improper body mechanics, Increased muscle spasms, Decreased mobility, Cardiopulmonary status limiting activity, Postural dysfunction, Difficulty walking, Impaired flexibility, Decreased activity tolerance, Decreased balance,  Increased edema  Visit Diagnosis: Stiffness of left knee, not elsewhere classified  Acute pain of left knee  Difficulty in walking, not elsewhere classified  Acute bilateral low back pain with  left-sided sciatica  Localized edema     Problem List Patient Active Problem List   Diagnosis Date Noted  . Status post left knee replacement 03/14/2020  . S/P left TKA 03/11/2020  . Preoperative clearance 02/22/2019  . Colon polyp   . Lower GI bleed 12/18/2015  . Diverticulosis of colon with hemorrhage   . Nausea with vomiting 12/02/2014  . Ileus, postoperative ([place]) 11/04/2014  . Hyponatremia 11/04/2014  . Abdominal fluid collection   . Chest pain 10/26/2014  . Acute calculous cholecystitis 10/26/2014  . Barrett esophagus   . History of GI diverticular bleed   . Essential hypertension   . Dyspnea 01/18/2012  . Aortic insufficiency 01/18/2012  . ANEMIA, IRON DEFICIENCY 06/04/2010  . HYPERCHOLESTEROLEMIA 02/08/2008  . GERD 02/08/2008  . ARTHRITIS 02/08/2008  . HIATAL HERNIA 11/02/2007  . DIVERTICULOSIS OF COLON 04/07/2004   Amador Cunas, PT, DPT Donald Prose Adalyne Lovick 07/22/2020, 2:32 PM  [place], [place], [address] Phone: [phone]   Fax:  [phone]  Name: IYSIS GERMAIN MRN: [id number] Date of Birth: [birth year]

## 2020-07-24 DIAGNOSIS — R69 Illness, unspecified: Secondary | ICD-10-CM | POA: Diagnosis not present

## 2020-07-29 ENCOUNTER — Encounter: Payer: Self-pay | Admitting: Physical Therapy

## 2020-07-29 ENCOUNTER — Other Ambulatory Visit: Payer: Self-pay

## 2020-07-29 ENCOUNTER — Ambulatory Visit: Payer: Medicare HMO | Admitting: Physical Therapy

## 2020-07-29 DIAGNOSIS — M25562 Pain in left knee: Secondary | ICD-10-CM

## 2020-07-29 DIAGNOSIS — R262 Difficulty in walking, not elsewhere classified: Secondary | ICD-10-CM

## 2020-07-29 DIAGNOSIS — R600 Localized edema: Secondary | ICD-10-CM | POA: Diagnosis not present

## 2020-07-29 DIAGNOSIS — M25662 Stiffness of left knee, not elsewhere classified: Secondary | ICD-10-CM

## 2020-07-29 DIAGNOSIS — M5442 Lumbago with sciatica, left side: Secondary | ICD-10-CM | POA: Diagnosis not present

## 2020-07-29 NOTE — Therapy (Signed)
[place], [place], [address] Phone: [phone]   Fax:  [phone]  Physical Therapy Treatment  Patient Details  Name: Terri Shah MRN: [id number] Date of Birth: 06/27/1934 Referring Provider (PT): Cathe Mons Date: 07/29/2020   PT End of Session - 07/29/20 1135    Visit Number 35    Date for PT Re-Evaluation 08/08/20    Authorization Type [employer]    PT Start Time 1053    PT Stop Time 1149    PT Time Calculation (min) 56 min    Activity Tolerance Patient tolerated treatment well    Behavior During Therapy [facility] for tasks assessed/performed           Past Medical History:  Diagnosis Date  . Allergy   . Anemia   . Arthritis   . Barrett esophagus   . Barrett's esophagus 11/02/2007   Qualifier: Diagnosis of  By: Mat Carne    . Blood transfusion without reported diagnosis    late 20's or early 30's  . Cataract   . Diverticulosis   . ECTOPIC PREGNANCY 02/08/2008   Qualifier: Diagnosis of  By: Mat Carne    . Family history of adverse reaction to anesthesia    older sister dont remember what it was   . Fibromyalgia    takes elavil  . Gastritis   . GASTRITIS 11/02/2007   Qualifier: Diagnosis of  By: Mat Carne    . GERD (gastroesophageal reflux disease)   . Glaucoma    right eye  . Heart murmur   . Hiatal hernia   . History of GI diverticular bleed   . Hyperlipidemia   . Hypertension   . Iron deficiency 2020  . Iron deficiency anemia   . Osteoporosis   . Palpitations 04/02/2010   Qualifier: Diagnosis of  By: Angelena Form, MD, Harrell Gave    . PONV (postoperative nausea and vomiting)    after c section  . Presbyesophagus   . Stroke [facility])    "small stroke" 2010- no residual per pt    Past Surgical History:  Procedure Laterality Date  . CATARACT EXTRACTION, BILATERAL    . CESAREAN SECTION  1950-1960s   x 3  . CHOLECYSTECTOMY    . COLONOSCOPY N/A  12/19/2015   Procedure: COLONOSCOPY;  Surgeon: Manus Gunning, MD;  Location: [facility];  Service: Gastroenterology;  Laterality: N/A;  . COLONOSCOPY    . [place]  . FACIAL COSMETIC SURGERY    . LAPAROSCOPIC CHOLECYSTECTOMY SINGLE SITE WITH INTRAOPERATIVE CHOLANGIOGRAM N/A 10/27/2014   Procedure: LAPAROSCOPIC CHOLECYSTECTOMY SINGLE SITE WITH INTRAOPERATIVE CHOLANGIOGRAM;  Surgeon: Aldrin Engelhard Boston, MD;  Location: [facility];  Service: General;  Laterality: N/A;  . TOTAL KNEE ARTHROPLASTY Left 03/11/2020   Procedure: TOTAL KNEE ARTHROPLASTY;  Surgeon: Paralee Cancel, MD;  Location: [facility];  Service: Orthopedics;  Laterality: Left;  70 mins  . TUBAL LIGATION    . UPPER GASTROINTESTINAL ENDOSCOPY      There were no vitals filed for this visit.   Subjective Assessment - 07/29/20 1116    Subjective Patient continues to have back pain and some knee stiffness, she reports that she is doing more and able to move easier and have less pain.  She brings in her HEP and wants to go over this and see what she can omit    Currently in Pain? Yes  Pain Score 2     Pain Location Back    Pain Orientation Lower    Pain Relieving Factors everything here has helped                             [facility] Adult PT Treatment/Exercise - 07/29/20 0001      Ambulation/Gait   Gait Comments stairs step over step up and down, lunge step stretch for flexion, fast walking in the hall, backward walking      Lumbar Exercises: Supine   Other Supine Lumbar Exercises isometric abdominals with ball 15 times    Other Supine Lumbar Exercises feet on ball small bridge, KTC, obl  15 each      Knee/Hip Exercises: Aerobic   Recumbent Bike 6 minutes seat # 2 and 3    Nustep L5 x 5 min      Knee/Hip Exercises: Supine   Short Arc Quad Sets Left;2 sets;10 reps    Short Arc Quad Sets Limitations 3#      Moist Heat Therapy   Number Minutes Moist Heat 12 Minutes    Moist Heat  Location Lumbar Spine      Electrical Stimulation   Electrical Stimulation Location lumbar area    Electrical Stimulation Action IFC    Electrical Stimulation Parameters supine    Electrical Stimulation Goals Pain      Vasopneumatic   Number Minutes Vasopneumatic  12 minutes    Vasopnuematic Location  Knee    Vasopneumatic Pressure Medium    Vasopneumatic Temperature  34      Manual Therapy   Manual Therapy Passive ROM    Joint Mobilization joint distraction with knee extension stretch seated    Passive ROM knee flex and ext                  PT Education - 07/29/20 1135    Education Details reviewed HEP    Person(s) Educated Patient    Methods Explanation;Demonstration;Tactile cues;Verbal cues    Comprehension Verbalized understanding;Returned demonstration;Verbal cues required            PT Short Term Goals - 04/21/20 1356      PT SHORT TERM GOAL #1   Title Ind with intial HEP    Time 2    Period Weeks    Status Achieved             PT Long Term Goals - 07/29/20 1138      PT LONG TERM GOAL #5   Title go up and down stairs step over step    Status Achieved                 Plan - 07/29/20 1136    Clinical Impression Statement no device with walking now, stairs step over step with only slight c/o tightness, no c/o pain.  We went over her HEP as she has some questions about it, she has some swelling in the left lateral knee area.  I really told her that the 4 exercises for her back would be good to continue on a daily basis and that she could back off to 1xday for the knee exercises and we took away a few exercises    PT Next Visit Plan we will see one more visit to assure the HEP and the ROM    Consulted and Agree with Plan of Care Patient  Patient will benefit from skilled therapeutic intervention in order to improve the following deficits and impairments:  Pain, Decreased strength, Decreased range of motion, Abnormal gait, Improper  body mechanics, Increased muscle spasms, Decreased mobility, Cardiopulmonary status limiting activity, Postural dysfunction, Difficulty walking, Impaired flexibility, Decreased activity tolerance, Decreased balance, Increased edema  Visit Diagnosis: Stiffness of left knee, not elsewhere classified  Acute pain of left knee  Difficulty in walking, not elsewhere classified  Acute bilateral low back pain with left-sided sciatica  Localized edema     Problem List Patient Active Problem List   Diagnosis Date Noted  . Status post left knee replacement 03/14/2020  . S/P left TKA 03/11/2020  . Preoperative clearance 02/22/2019  . Colon polyp   . Lower GI bleed 12/18/2015  . Diverticulosis of colon with hemorrhage   . Nausea with vomiting 12/02/2014  . Ileus, postoperative ([place]) 11/04/2014  . Hyponatremia 11/04/2014  . Abdominal fluid collection   . Chest pain 10/26/2014  . Acute calculous cholecystitis 10/26/2014  . Barrett esophagus   . History of GI diverticular bleed   . Essential hypertension   . Dyspnea 01/18/2012  . Aortic insufficiency 01/18/2012  . ANEMIA, IRON DEFICIENCY 06/04/2010  . HYPERCHOLESTEROLEMIA 02/08/2008  . GERD 02/08/2008  . ARTHRITIS 02/08/2008  . HIATAL HERNIA 11/02/2007  . DIVERTICULOSIS OF COLON 04/07/2004    Sumner Boast., PT 07/29/2020, 11:39 AM  [place] [address] [place], [place], [address] Phone: [phone]   Fax:  [phone]  Name: Terri Shah MRN: [id number] Date of Birth: [birth year]

## 2020-08-04 ENCOUNTER — Other Ambulatory Visit: Payer: Self-pay

## 2020-08-04 ENCOUNTER — Ambulatory Visit: Payer: Medicare HMO | Admitting: Physical Therapy

## 2020-08-04 ENCOUNTER — Encounter: Payer: Self-pay | Admitting: Physical Therapy

## 2020-08-04 DIAGNOSIS — M25562 Pain in left knee: Secondary | ICD-10-CM

## 2020-08-04 DIAGNOSIS — R262 Difficulty in walking, not elsewhere classified: Secondary | ICD-10-CM | POA: Diagnosis not present

## 2020-08-04 DIAGNOSIS — M5442 Lumbago with sciatica, left side: Secondary | ICD-10-CM

## 2020-08-04 DIAGNOSIS — M25662 Stiffness of left knee, not elsewhere classified: Secondary | ICD-10-CM

## 2020-08-04 DIAGNOSIS — R600 Localized edema: Secondary | ICD-10-CM | POA: Diagnosis not present

## 2020-08-04 NOTE — Therapy (Signed)
[place], [place], [address] Phone: [phone]   Fax:  [phone]  Physical Therapy Treatment  Patient Details  Name: Terri Shah MRN: [id number] Date of Birth: 15-May-1934 Referring Provider (PT): Alvan Dame   Encounter Date: 08/04/2020   PT End of Session - 08/04/20 1346    Visit Number 22    PT Start Time 1310    PT Stop Time 1406    PT Time Calculation (min) 56 min    Activity Tolerance Patient tolerated treatment well    Behavior During Therapy [facility] for tasks assessed/performed           Past Medical History:  Diagnosis Date  . Allergy   . Anemia   . Arthritis   . Barrett esophagus   . Barrett's esophagus 11/02/2007   Qualifier: Diagnosis of  By: Mat Carne    . Blood transfusion without reported diagnosis    late 20's or early 30's  . Cataract   . Diverticulosis   . ECTOPIC PREGNANCY 02/08/2008   Qualifier: Diagnosis of  By: Mat Carne    . Family history of adverse reaction to anesthesia    older sister dont remember what it was   . Fibromyalgia    takes elavil  . Gastritis   . GASTRITIS 11/02/2007   Qualifier: Diagnosis of  By: Mat Carne    . GERD (gastroesophageal reflux disease)   . Glaucoma    right eye  . Heart murmur   . Hiatal hernia   . History of GI diverticular bleed   . Hyperlipidemia   . Hypertension   . Iron deficiency 2020  . Iron deficiency anemia   . Osteoporosis   . Palpitations 04/02/2010   Qualifier: Diagnosis of  By: Angelena Form, MD, Harrell Gave    . PONV (postoperative nausea and vomiting)    after c section  . Presbyesophagus   . Stroke [facility])    "small stroke" 2010- no residual per pt    Past Surgical History:  Procedure Laterality Date  . CATARACT EXTRACTION, BILATERAL    . CESAREAN SECTION  1950-1960s   x 3  . CHOLECYSTECTOMY    . COLONOSCOPY N/A 12/19/2015   Procedure: COLONOSCOPY;  Surgeon: Manus Gunning, MD;   Location: [facility];  Service: Gastroenterology;  Laterality: N/A;  . COLONOSCOPY    . [place]  . FACIAL COSMETIC SURGERY    . LAPAROSCOPIC CHOLECYSTECTOMY SINGLE SITE WITH INTRAOPERATIVE CHOLANGIOGRAM N/A 10/27/2014   Procedure: LAPAROSCOPIC CHOLECYSTECTOMY SINGLE SITE WITH INTRAOPERATIVE CHOLANGIOGRAM;  Surgeon: Aislyn Hayse Boston, MD;  Location: [facility];  Service: General;  Laterality: N/A;  . TOTAL KNEE ARTHROPLASTY Left 03/11/2020   Procedure: TOTAL KNEE ARTHROPLASTY;  Surgeon: Paralee Cancel, MD;  Location: [facility];  Service: Orthopedics;  Laterality: Left;  70 mins  . TUBAL LIGATION    . UPPER GASTROINTESTINAL ENDOSCOPY      There were no vitals filed for this visit.   Subjective Assessment - 08/04/20 1318    Subjective Patient reports that the knee still feels weak.  she reports that the back is hurting more due to the rain    Currently in Pain? Yes    Pain Score 4     Pain Location Back    Pain Orientation Lower    Aggravating Factors  rain              [facility] PT  Assessment - 08/04/20 0001      AROM   Left Knee Extension 5    Left Knee Flexion 109      PROM   Left Knee Extension 0    Left Knee Flexion 116                         [facility] Adult PT Treatment/Exercise - 08/04/20 0001      Lumbar Exercises: Stretches   Passive Hamstring Stretch Right;Left;4 reps;10 seconds    Single Knee to Chest Stretch Right;Left;4 reps;10 seconds    Double Knee to Chest Stretch 4 reps;10 seconds    Lower Trunk Rotation 4 reps;10 seconds      Lumbar Exercises: Supine   Other Supine Lumbar Exercises isometric abdominals with ball 15 times    Other Supine Lumbar Exercises feet on ball small bridge, KTC, obl  15 each      Knee/Hip Exercises: Aerobic   Recumbent Bike 6 minutes seat # 2 and 3    Nustep L5 x 5 min      Knee/Hip Exercises: Supine   Short Arc Quad Sets Left;2 sets;10 reps    Short Arc Quad Sets Limitations 3#      Corporate treasurer lumbar area    Chartered certified accountant IFC    Electrical Stimulation Parameters supine    Electrical Stimulation Goals Pain                  PT Education - 08/04/20 1345    Education Details reviewed HEP for knee and back, went over what to expect and how to add to exercises and got to some classes, I encouraged water exercises    Person(s) Educated Patient    Methods Explanation;Demonstration    Comprehension Verbalized understanding;Returned demonstration            PT Short Term Goals - 04/21/20 1356      PT SHORT TERM GOAL #1   Title Ind with intial HEP    Time 2    Period Weeks    Status Achieved             PT Long Term Goals - 08/04/20 1348      PT LONG TERM GOAL #1   Title independent with RICE    Status Achieved      PT LONG TERM GOAL #2   Title decrease pain 50%    Status Achieved      PT LONG TERM GOAL #3   Title increase left knee aROM to 5-115 degrees flexion    Status Partially Met      PT LONG TERM GOAL #4   Title walk with SPC or without device for all household mobility    Status Achieved      PT LONG TERM GOAL #5   Status Achieved                 Plan - 08/04/20 1347    Clinical Impression Statement Pateint overall doing well, she has maintained her ROM and is walking better, no device needed, able to do her shopping, she does c/o weakness in the knee and fatigue and then she does have chronic back issues.  talked with her about water exercises and how they could help, also encouraged Wms flexion exercises    PT Next Visit Plan d/c goals met    Consulted and Agree with Plan of Care Patient  Patient will benefit from skilled therapeutic intervention in order to improve the following deficits and impairments:  Pain, Decreased strength, Decreased range of motion, Abnormal gait, Improper body mechanics, Increased muscle spasms, Decreased mobility, Cardiopulmonary  status limiting activity, Postural dysfunction, Difficulty walking, Impaired flexibility, Decreased activity tolerance, Decreased balance, Increased edema  Visit Diagnosis: Stiffness of left knee, not elsewhere classified  Acute pain of left knee  Difficulty in walking, not elsewhere classified  Acute bilateral low back pain with left-sided sciatica     Problem List Patient Active Problem List   Diagnosis Date Noted  . Status post left knee replacement 03/14/2020  . S/P left TKA 03/11/2020  . Preoperative clearance 02/22/2019  . Colon polyp   . Lower GI bleed 12/18/2015  . Diverticulosis of colon with hemorrhage   . Nausea with vomiting 12/02/2014  . Ileus, postoperative ([place]) 11/04/2014  . Hyponatremia 11/04/2014  . Abdominal fluid collection   . Chest pain 10/26/2014  . Acute calculous cholecystitis 10/26/2014  . Barrett esophagus   . History of GI diverticular bleed   . Essential hypertension   . Dyspnea 01/18/2012  . Aortic insufficiency 01/18/2012  . ANEMIA, IRON DEFICIENCY 06/04/2010  . HYPERCHOLESTEROLEMIA 02/08/2008  . GERD 02/08/2008  . ARTHRITIS 02/08/2008  . HIATAL HERNIA 11/02/2007  . DIVERTICULOSIS OF COLON 04/07/2004    Sumner Boast., PT 08/04/2020, 1:49 PM  [place] [address] [place], [place], [address] Phone: [phone]   Fax:  [phone]  Name: MADHAVI HAMBLEN MRN: [id number] Date of Birth: May 26, 1934

## 2020-08-11 DIAGNOSIS — H4051X1 Glaucoma secondary to other eye disorders, right eye, mild stage: Secondary | ICD-10-CM | POA: Diagnosis not present

## 2020-08-11 DIAGNOSIS — H18519 Endothelial corneal dystrophy, unspecified eye: Secondary | ICD-10-CM | POA: Diagnosis not present

## 2020-08-11 DIAGNOSIS — T8529XD Other mechanical complication of intraocular lens, subsequent encounter: Secondary | ICD-10-CM | POA: Diagnosis not present

## 2020-08-12 ENCOUNTER — Ambulatory Visit: Payer: Medicare HMO | Admitting: Physical Therapy

## 2020-08-27 ENCOUNTER — Other Ambulatory Visit (HOSPITAL_COMMUNITY): Payer: Self-pay | Admitting: *Deleted

## 2020-08-28 ENCOUNTER — Ambulatory Visit (HOSPITAL_COMMUNITY)
Admission: RE | Admit: 2020-08-28 | Discharge: 2020-08-28 | Disposition: A | Discharge status: Home / Self Care | Payer: Medicare HMO | Source: Ambulatory Visit | Attending: Internal Medicine | Admitting: Internal Medicine

## 2020-08-28 ENCOUNTER — Other Ambulatory Visit: Payer: Self-pay

## 2020-08-28 DIAGNOSIS — M810 Age-related osteoporosis without current pathological fracture: Secondary | ICD-10-CM | POA: Insufficient documentation

## 2020-08-28 MED ORDER — DENOSUMAB 60 MG/ML ~~LOC~~ SOSY
60.0000 mg | PREFILLED_SYRINGE | Freq: Once | SUBCUTANEOUS | Status: AC
  Administered 2020-08-28: 60 mg via SUBCUTANEOUS

## 2020-08-28 MED ORDER — DENOSUMAB 60 MG/ML ~~LOC~~ SOSY
PREFILLED_SYRINGE | SUBCUTANEOUS | Status: AC
  Filled 2020-08-28: qty 1

## 2020-09-27 DIAGNOSIS — Z23 Encounter for immunization: Secondary | ICD-10-CM | POA: Diagnosis not present

## 2020-09-29 ENCOUNTER — Other Ambulatory Visit: Payer: Self-pay | Admitting: Internal Medicine

## 2020-09-29 DIAGNOSIS — Z1231 Encounter for screening mammogram for malignant neoplasm of breast: Secondary | ICD-10-CM

## 2020-10-01 DIAGNOSIS — D1801 Hemangioma of skin and subcutaneous tissue: Secondary | ICD-10-CM | POA: Diagnosis not present

## 2020-10-01 DIAGNOSIS — L821 Other seborrheic keratosis: Secondary | ICD-10-CM | POA: Diagnosis not present

## 2020-10-01 DIAGNOSIS — L570 Actinic keratosis: Secondary | ICD-10-CM | POA: Diagnosis not present

## 2020-10-01 DIAGNOSIS — L812 Freckles: Secondary | ICD-10-CM | POA: Diagnosis not present

## 2020-10-24 ENCOUNTER — Ambulatory Visit
Admission: RE | Admit: 2020-10-24 | Discharge: 2020-10-24 | Disposition: A | Discharge status: Home / Self Care | Payer: Medicare HMO | Source: Ambulatory Visit | Attending: Internal Medicine | Admitting: Internal Medicine

## 2020-10-24 ENCOUNTER — Other Ambulatory Visit: Payer: Self-pay

## 2020-10-24 DIAGNOSIS — Z1231 Encounter for screening mammogram for malignant neoplasm of breast: Secondary | ICD-10-CM

## 2020-10-27 DIAGNOSIS — M545 Low back pain, unspecified: Secondary | ICD-10-CM | POA: Diagnosis not present

## 2020-10-27 DIAGNOSIS — Z96652 Presence of left artificial knee joint: Secondary | ICD-10-CM | POA: Diagnosis not present

## 2020-10-31 DIAGNOSIS — H409 Unspecified glaucoma: Secondary | ICD-10-CM | POA: Diagnosis not present

## 2020-10-31 DIAGNOSIS — Z008 Encounter for other general examination: Secondary | ICD-10-CM | POA: Diagnosis not present

## 2020-10-31 DIAGNOSIS — G8929 Other chronic pain: Secondary | ICD-10-CM | POA: Diagnosis not present

## 2020-10-31 DIAGNOSIS — I10 Essential (primary) hypertension: Secondary | ICD-10-CM | POA: Diagnosis not present

## 2020-10-31 DIAGNOSIS — Z803 Family history of malignant neoplasm of breast: Secondary | ICD-10-CM | POA: Diagnosis not present

## 2020-10-31 DIAGNOSIS — M810 Age-related osteoporosis without current pathological fracture: Secondary | ICD-10-CM | POA: Diagnosis not present

## 2020-10-31 DIAGNOSIS — E785 Hyperlipidemia, unspecified: Secondary | ICD-10-CM | POA: Diagnosis not present

## 2020-10-31 DIAGNOSIS — K219 Gastro-esophageal reflux disease without esophagitis: Secondary | ICD-10-CM | POA: Diagnosis not present

## 2020-10-31 DIAGNOSIS — M797 Fibromyalgia: Secondary | ICD-10-CM | POA: Diagnosis not present

## 2020-10-31 DIAGNOSIS — M1990 Unspecified osteoarthritis, unspecified site: Secondary | ICD-10-CM | POA: Diagnosis not present

## 2020-10-31 DIAGNOSIS — Z79899 Other long term (current) drug therapy: Secondary | ICD-10-CM | POA: Diagnosis not present

## 2020-11-12 DIAGNOSIS — H4051X1 Glaucoma secondary to other eye disorders, right eye, mild stage: Secondary | ICD-10-CM | POA: Diagnosis not present

## 2020-12-11 DIAGNOSIS — D509 Iron deficiency anemia, unspecified: Secondary | ICD-10-CM | POA: Diagnosis not present

## 2020-12-11 DIAGNOSIS — I4510 Unspecified right bundle-branch block: Secondary | ICD-10-CM | POA: Diagnosis not present

## 2020-12-11 DIAGNOSIS — I10 Essential (primary) hypertension: Secondary | ICD-10-CM | POA: Diagnosis not present

## 2020-12-11 DIAGNOSIS — M797 Fibromyalgia: Secondary | ICD-10-CM | POA: Diagnosis not present

## 2020-12-11 DIAGNOSIS — R69 Illness, unspecified: Secondary | ICD-10-CM | POA: Diagnosis not present

## 2020-12-11 DIAGNOSIS — E785 Hyperlipidemia, unspecified: Secondary | ICD-10-CM | POA: Diagnosis not present

## 2020-12-11 DIAGNOSIS — E559 Vitamin D deficiency, unspecified: Secondary | ICD-10-CM | POA: Diagnosis not present

## 2020-12-11 DIAGNOSIS — E871 Hypo-osmolality and hyponatremia: Secondary | ICD-10-CM | POA: Diagnosis not present

## 2020-12-11 DIAGNOSIS — M1990 Unspecified osteoarthritis, unspecified site: Secondary | ICD-10-CM | POA: Diagnosis not present

## 2020-12-11 DIAGNOSIS — I6990 Unspecified sequelae of unspecified cerebrovascular disease: Secondary | ICD-10-CM | POA: Diagnosis not present

## 2021-02-23 DIAGNOSIS — M5136 Other intervertebral disc degeneration, lumbar region: Secondary | ICD-10-CM | POA: Diagnosis not present

## 2021-02-23 DIAGNOSIS — M9905 Segmental and somatic dysfunction of pelvic region: Secondary | ICD-10-CM | POA: Diagnosis not present

## 2021-02-23 DIAGNOSIS — M9904 Segmental and somatic dysfunction of sacral region: Secondary | ICD-10-CM | POA: Diagnosis not present

## 2021-02-23 DIAGNOSIS — M9903 Segmental and somatic dysfunction of lumbar region: Secondary | ICD-10-CM | POA: Diagnosis not present

## 2021-03-02 ENCOUNTER — Other Ambulatory Visit (HOSPITAL_COMMUNITY): Payer: Self-pay | Admitting: *Deleted

## 2021-03-03 ENCOUNTER — Other Ambulatory Visit: Payer: Self-pay

## 2021-03-03 ENCOUNTER — Ambulatory Visit (HOSPITAL_COMMUNITY)
Admission: RE | Admit: 2021-03-03 | Discharge: 2021-03-03 | Disposition: A | Discharge status: Home / Self Care | Payer: Medicare HMO | Source: Ambulatory Visit | Attending: Internal Medicine | Admitting: Internal Medicine

## 2021-03-03 DIAGNOSIS — M9905 Segmental and somatic dysfunction of pelvic region: Secondary | ICD-10-CM | POA: Diagnosis not present

## 2021-03-03 DIAGNOSIS — M9904 Segmental and somatic dysfunction of sacral region: Secondary | ICD-10-CM | POA: Diagnosis not present

## 2021-03-03 DIAGNOSIS — M810 Age-related osteoporosis without current pathological fracture: Secondary | ICD-10-CM | POA: Diagnosis present

## 2021-03-03 DIAGNOSIS — M5136 Other intervertebral disc degeneration, lumbar region: Secondary | ICD-10-CM | POA: Diagnosis not present

## 2021-03-03 DIAGNOSIS — M9903 Segmental and somatic dysfunction of lumbar region: Secondary | ICD-10-CM | POA: Diagnosis not present

## 2021-03-03 MED ORDER — DENOSUMAB 60 MG/ML ~~LOC~~ SOSY
PREFILLED_SYRINGE | SUBCUTANEOUS | Status: AC
  Administered 2021-03-03: 60 mg
  Filled 2021-03-03: qty 1

## 2021-03-11 DIAGNOSIS — M9904 Segmental and somatic dysfunction of sacral region: Secondary | ICD-10-CM | POA: Diagnosis not present

## 2021-03-11 DIAGNOSIS — M9903 Segmental and somatic dysfunction of lumbar region: Secondary | ICD-10-CM | POA: Diagnosis not present

## 2021-03-11 DIAGNOSIS — M9905 Segmental and somatic dysfunction of pelvic region: Secondary | ICD-10-CM | POA: Diagnosis not present

## 2021-03-11 DIAGNOSIS — M5136 Other intervertebral disc degeneration, lumbar region: Secondary | ICD-10-CM | POA: Diagnosis not present

## 2021-03-16 DIAGNOSIS — Z96642 Presence of left artificial hip joint: Secondary | ICD-10-CM | POA: Diagnosis not present

## 2021-03-18 DIAGNOSIS — T8529XD Other mechanical complication of intraocular lens, subsequent encounter: Secondary | ICD-10-CM | POA: Diagnosis not present

## 2021-03-18 DIAGNOSIS — H18513 Endothelial corneal dystrophy, bilateral: Secondary | ICD-10-CM | POA: Diagnosis not present

## 2021-03-18 DIAGNOSIS — Z961 Presence of intraocular lens: Secondary | ICD-10-CM | POA: Diagnosis not present

## 2021-03-18 DIAGNOSIS — H4051X1 Glaucoma secondary to other eye disorders, right eye, mild stage: Secondary | ICD-10-CM | POA: Diagnosis not present

## 2021-04-15 DIAGNOSIS — M9903 Segmental and somatic dysfunction of lumbar region: Secondary | ICD-10-CM | POA: Diagnosis not present

## 2021-04-15 DIAGNOSIS — M5136 Other intervertebral disc degeneration, lumbar region: Secondary | ICD-10-CM | POA: Diagnosis not present

## 2021-04-15 DIAGNOSIS — M9905 Segmental and somatic dysfunction of pelvic region: Secondary | ICD-10-CM | POA: Diagnosis not present

## 2021-04-15 DIAGNOSIS — M9904 Segmental and somatic dysfunction of sacral region: Secondary | ICD-10-CM | POA: Diagnosis not present

## 2021-04-21 DIAGNOSIS — M9904 Segmental and somatic dysfunction of sacral region: Secondary | ICD-10-CM | POA: Diagnosis not present

## 2021-04-21 DIAGNOSIS — M9905 Segmental and somatic dysfunction of pelvic region: Secondary | ICD-10-CM | POA: Diagnosis not present

## 2021-04-21 DIAGNOSIS — M5136 Other intervertebral disc degeneration, lumbar region: Secondary | ICD-10-CM | POA: Diagnosis not present

## 2021-04-21 DIAGNOSIS — M9903 Segmental and somatic dysfunction of lumbar region: Secondary | ICD-10-CM | POA: Diagnosis not present

## 2021-05-05 DIAGNOSIS — M9903 Segmental and somatic dysfunction of lumbar region: Secondary | ICD-10-CM | POA: Diagnosis not present

## 2021-05-05 DIAGNOSIS — M9904 Segmental and somatic dysfunction of sacral region: Secondary | ICD-10-CM | POA: Diagnosis not present

## 2021-05-05 DIAGNOSIS — M5136 Other intervertebral disc degeneration, lumbar region: Secondary | ICD-10-CM | POA: Diagnosis not present

## 2021-05-05 DIAGNOSIS — M9905 Segmental and somatic dysfunction of pelvic region: Secondary | ICD-10-CM | POA: Diagnosis not present

## 2021-05-14 DIAGNOSIS — M5136 Other intervertebral disc degeneration, lumbar region: Secondary | ICD-10-CM | POA: Diagnosis not present

## 2021-05-14 DIAGNOSIS — M9904 Segmental and somatic dysfunction of sacral region: Secondary | ICD-10-CM | POA: Diagnosis not present

## 2021-05-14 DIAGNOSIS — M9905 Segmental and somatic dysfunction of pelvic region: Secondary | ICD-10-CM | POA: Diagnosis not present

## 2021-05-14 DIAGNOSIS — M9903 Segmental and somatic dysfunction of lumbar region: Secondary | ICD-10-CM | POA: Diagnosis not present

## 2021-05-22 DIAGNOSIS — H4051X1 Glaucoma secondary to other eye disorders, right eye, mild stage: Secondary | ICD-10-CM | POA: Diagnosis not present

## 2021-05-27 DIAGNOSIS — M5136 Other intervertebral disc degeneration, lumbar region: Secondary | ICD-10-CM | POA: Diagnosis not present

## 2021-05-27 DIAGNOSIS — M9903 Segmental and somatic dysfunction of lumbar region: Secondary | ICD-10-CM | POA: Diagnosis not present

## 2021-05-27 DIAGNOSIS — M9904 Segmental and somatic dysfunction of sacral region: Secondary | ICD-10-CM | POA: Diagnosis not present

## 2021-05-27 DIAGNOSIS — M9905 Segmental and somatic dysfunction of pelvic region: Secondary | ICD-10-CM | POA: Diagnosis not present

## 2021-06-11 DIAGNOSIS — M9903 Segmental and somatic dysfunction of lumbar region: Secondary | ICD-10-CM | POA: Diagnosis not present

## 2021-06-11 DIAGNOSIS — H4051X1 Glaucoma secondary to other eye disorders, right eye, mild stage: Secondary | ICD-10-CM | POA: Diagnosis not present

## 2021-06-11 DIAGNOSIS — M9904 Segmental and somatic dysfunction of sacral region: Secondary | ICD-10-CM | POA: Diagnosis not present

## 2021-06-11 DIAGNOSIS — M9905 Segmental and somatic dysfunction of pelvic region: Secondary | ICD-10-CM | POA: Diagnosis not present

## 2021-06-11 DIAGNOSIS — M5136 Other intervertebral disc degeneration, lumbar region: Secondary | ICD-10-CM | POA: Diagnosis not present

## 2021-06-12 DIAGNOSIS — E559 Vitamin D deficiency, unspecified: Secondary | ICD-10-CM | POA: Diagnosis not present

## 2021-06-12 DIAGNOSIS — E785 Hyperlipidemia, unspecified: Secondary | ICD-10-CM | POA: Diagnosis not present

## 2021-06-19 DIAGNOSIS — E559 Vitamin D deficiency, unspecified: Secondary | ICD-10-CM | POA: Diagnosis not present

## 2021-06-19 DIAGNOSIS — M810 Age-related osteoporosis without current pathological fracture: Secondary | ICD-10-CM | POA: Diagnosis not present

## 2021-06-19 DIAGNOSIS — E785 Hyperlipidemia, unspecified: Secondary | ICD-10-CM | POA: Diagnosis not present

## 2021-06-19 DIAGNOSIS — D509 Iron deficiency anemia, unspecified: Secondary | ICD-10-CM | POA: Diagnosis not present

## 2021-06-19 DIAGNOSIS — I10 Essential (primary) hypertension: Secondary | ICD-10-CM | POA: Diagnosis not present

## 2021-06-19 DIAGNOSIS — M797 Fibromyalgia: Secondary | ICD-10-CM | POA: Diagnosis not present

## 2021-06-19 DIAGNOSIS — M1990 Unspecified osteoarthritis, unspecified site: Secondary | ICD-10-CM | POA: Diagnosis not present

## 2021-06-19 DIAGNOSIS — D692 Other nonthrombocytopenic purpura: Secondary | ICD-10-CM | POA: Diagnosis not present

## 2021-06-19 DIAGNOSIS — Z0000 Encounter for general adult medical examination without abnormal findings: Secondary | ICD-10-CM | POA: Diagnosis not present

## 2021-06-19 DIAGNOSIS — R82998 Other abnormal findings in urine: Secondary | ICD-10-CM | POA: Diagnosis not present

## 2021-06-19 DIAGNOSIS — Z1331 Encounter for screening for depression: Secondary | ICD-10-CM | POA: Diagnosis not present

## 2021-06-19 DIAGNOSIS — R69 Illness, unspecified: Secondary | ICD-10-CM | POA: Diagnosis not present

## 2021-06-19 DIAGNOSIS — Z1389 Encounter for screening for other disorder: Secondary | ICD-10-CM | POA: Diagnosis not present

## 2021-06-29 DIAGNOSIS — M5136 Other intervertebral disc degeneration, lumbar region: Secondary | ICD-10-CM | POA: Diagnosis not present

## 2021-06-29 DIAGNOSIS — M9905 Segmental and somatic dysfunction of pelvic region: Secondary | ICD-10-CM | POA: Diagnosis not present

## 2021-06-29 DIAGNOSIS — M9903 Segmental and somatic dysfunction of lumbar region: Secondary | ICD-10-CM | POA: Diagnosis not present

## 2021-06-29 DIAGNOSIS — M9904 Segmental and somatic dysfunction of sacral region: Secondary | ICD-10-CM | POA: Diagnosis not present

## 2021-07-14 DIAGNOSIS — M9905 Segmental and somatic dysfunction of pelvic region: Secondary | ICD-10-CM | POA: Diagnosis not present

## 2021-07-14 DIAGNOSIS — M5136 Other intervertebral disc degeneration, lumbar region: Secondary | ICD-10-CM | POA: Diagnosis not present

## 2021-07-14 DIAGNOSIS — M9903 Segmental and somatic dysfunction of lumbar region: Secondary | ICD-10-CM | POA: Diagnosis not present

## 2021-07-14 DIAGNOSIS — M9904 Segmental and somatic dysfunction of sacral region: Secondary | ICD-10-CM | POA: Diagnosis not present

## 2021-07-25 IMAGING — MG DIGITAL SCREENING BILAT W/ TOMO W/ CAD
8 series · 9 of 24 positions shown · non-contrast
Comparison: Previous exam(s).

CLINICAL DATA: Screening.

EXAM:
DIGITAL SCREENING BILATERAL MAMMOGRAM WITH TOMO AND CAD

[L MLO synth-2D]
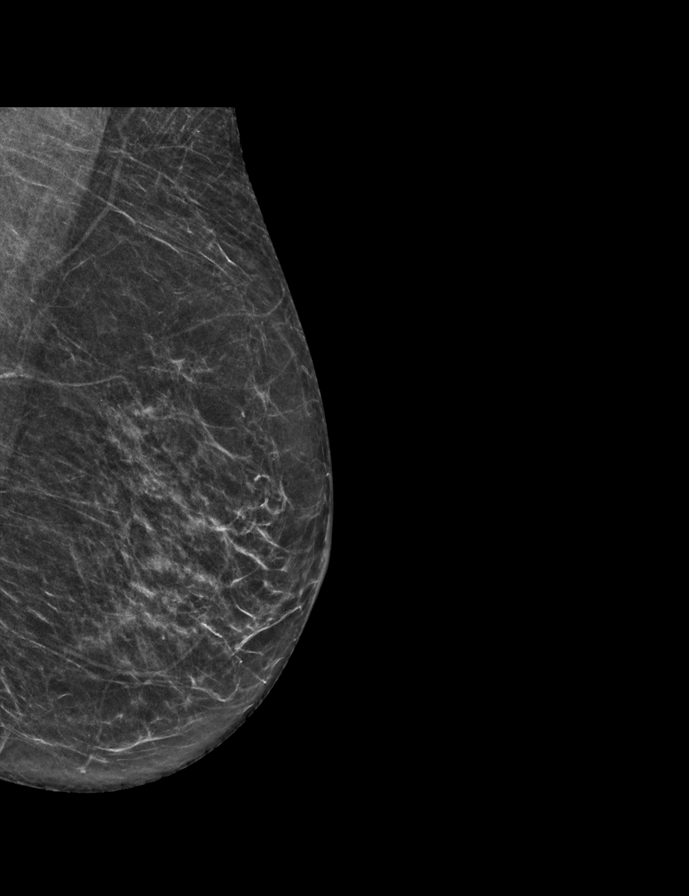

[R MLO synth-2D]
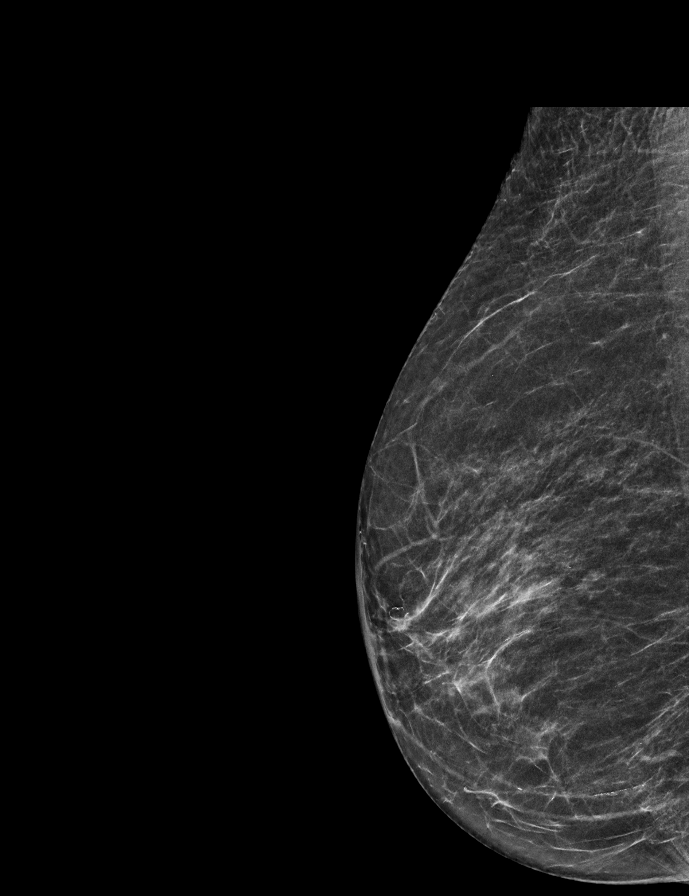

[L CC synth-2D]
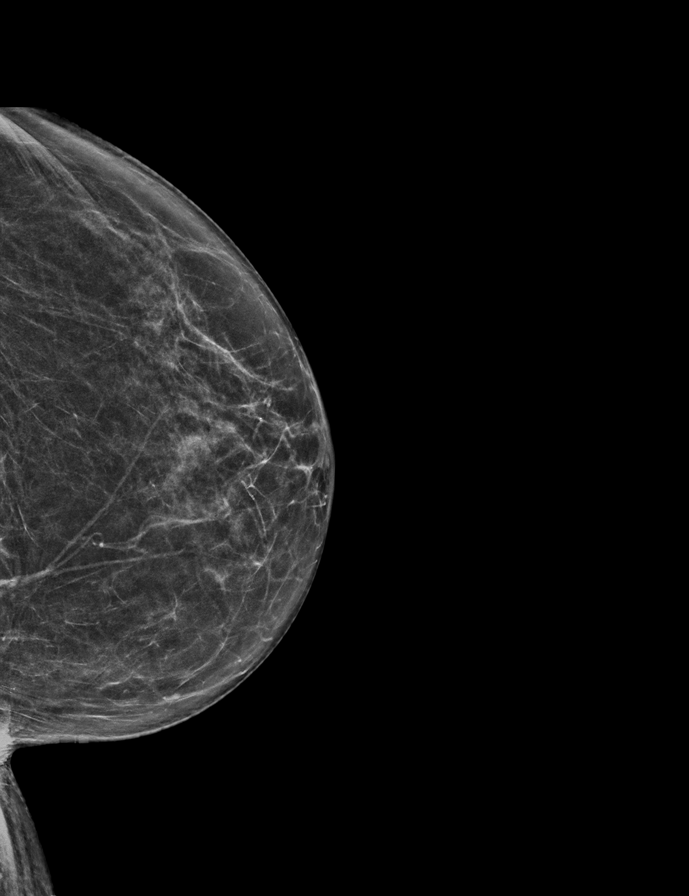

[R CC synth-2D]
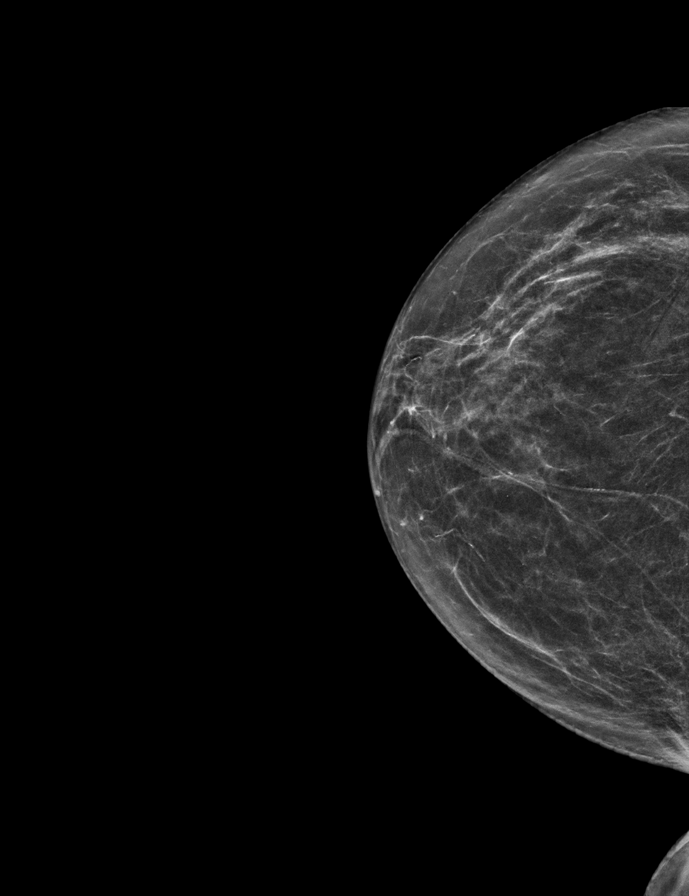

[R MLO tomo · 2 of 50 frames shown]
[frame 17/50]
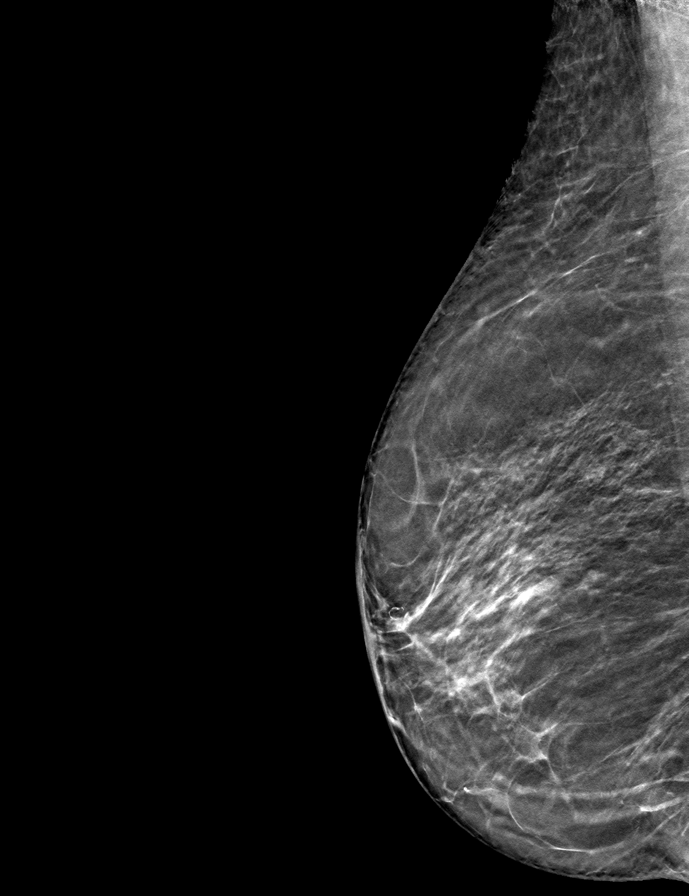
[frame 25/50]
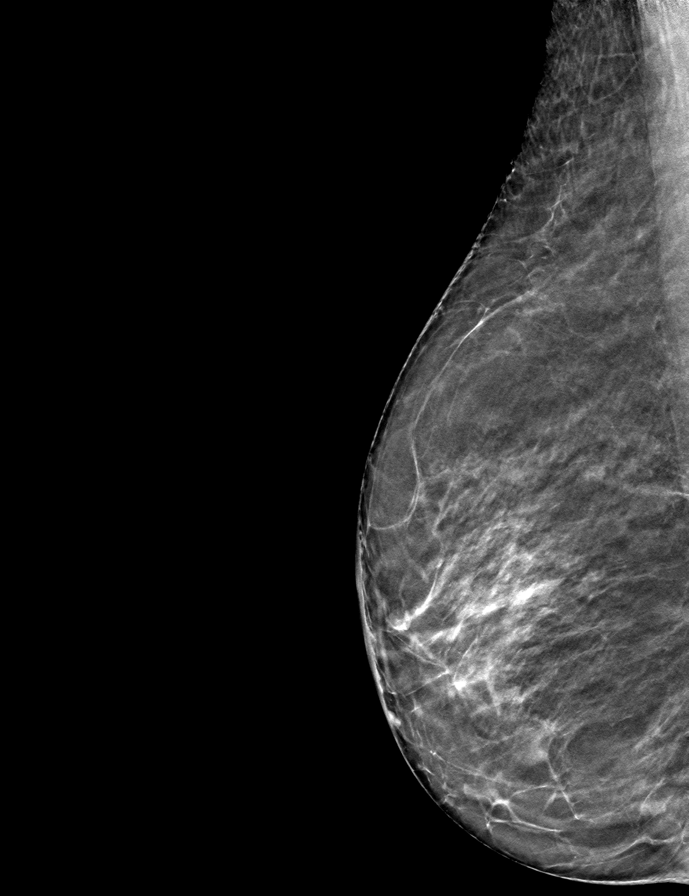

[L CC tomo · tomo slice 28/55.0]
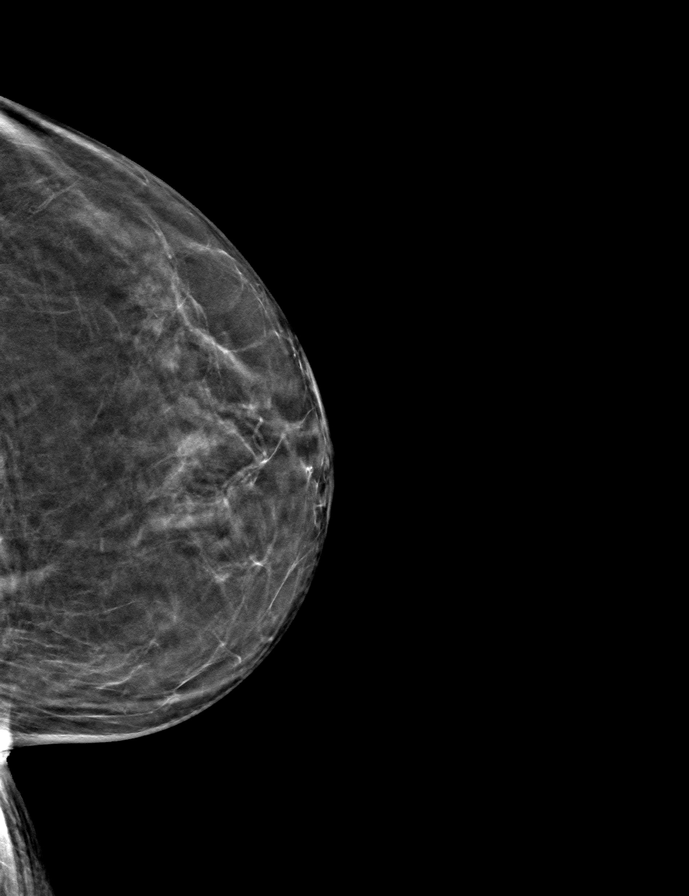

[L MLO tomo · tomo slice 25/49.0]
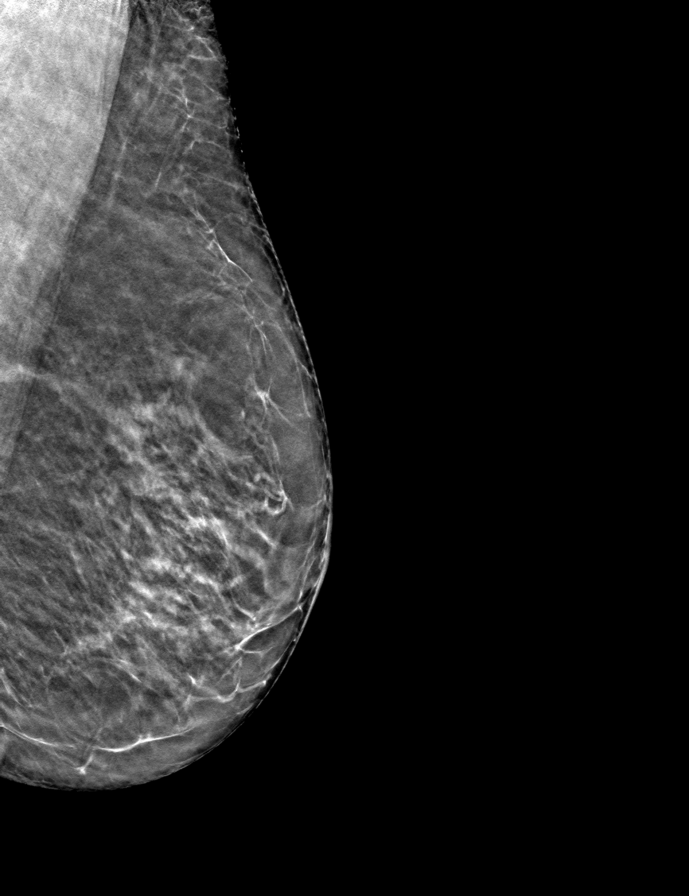

[R CC tomo · tomo slice 27/54.0]
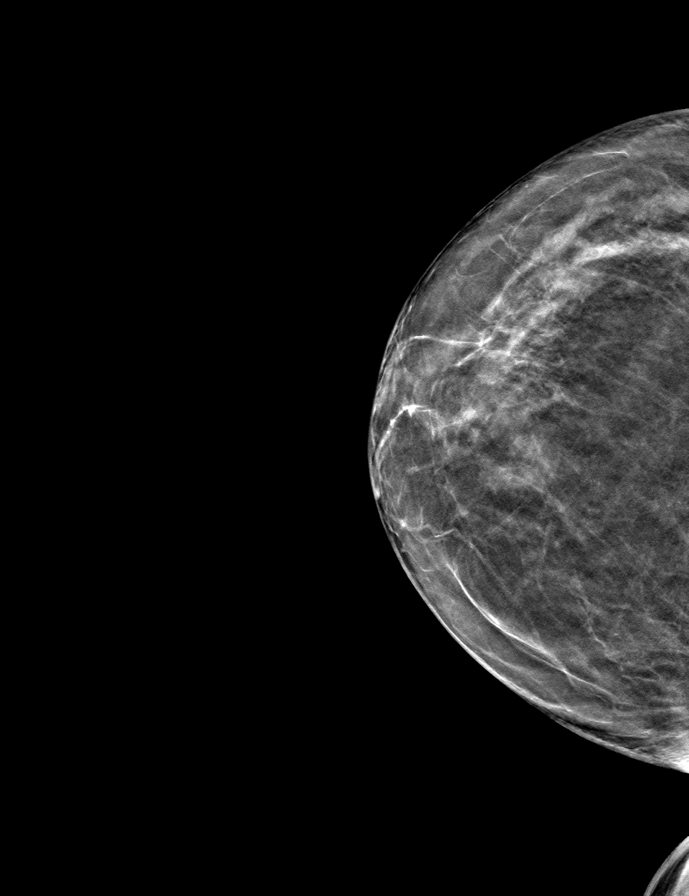

[9 of 24 positions shown; findings below may reference images not displayed]

ACR Breast Density Category b: There are scattered areas of
fibroglandular density.
FINDINGS: There are no findings suspicious for malignancy. Images were
processed with CAD.
IMPRESSION: No mammographic evidence of malignancy. A result letter of this
screening mammogram will be mailed directly to the patient.

RECOMMENDATION:
Screening mammogram in one year. (Code:[id number])

BI-RADS CATEGORY  1: Negative.

## 2021-08-05 DIAGNOSIS — M9903 Segmental and somatic dysfunction of lumbar region: Secondary | ICD-10-CM | POA: Diagnosis not present

## 2021-08-05 DIAGNOSIS — M5136 Other intervertebral disc degeneration, lumbar region: Secondary | ICD-10-CM | POA: Diagnosis not present

## 2021-08-05 DIAGNOSIS — M9905 Segmental and somatic dysfunction of pelvic region: Secondary | ICD-10-CM | POA: Diagnosis not present

## 2021-08-05 DIAGNOSIS — M9904 Segmental and somatic dysfunction of sacral region: Secondary | ICD-10-CM | POA: Diagnosis not present

## 2021-08-27 DIAGNOSIS — N390 Urinary tract infection, site not specified: Secondary | ICD-10-CM | POA: Diagnosis not present

## 2021-09-28 ENCOUNTER — Other Ambulatory Visit: Payer: Self-pay | Admitting: Internal Medicine

## 2021-09-28 DIAGNOSIS — Z1231 Encounter for screening mammogram for malignant neoplasm of breast: Secondary | ICD-10-CM

## 2021-10-06 DIAGNOSIS — D1801 Hemangioma of skin and subcutaneous tissue: Secondary | ICD-10-CM | POA: Diagnosis not present

## 2021-10-06 DIAGNOSIS — L812 Freckles: Secondary | ICD-10-CM | POA: Diagnosis not present

## 2021-10-06 DIAGNOSIS — L821 Other seborrheic keratosis: Secondary | ICD-10-CM | POA: Diagnosis not present

## 2021-10-06 DIAGNOSIS — L309 Dermatitis, unspecified: Secondary | ICD-10-CM | POA: Diagnosis not present

## 2021-10-06 DIAGNOSIS — L820 Inflamed seborrheic keratosis: Secondary | ICD-10-CM | POA: Diagnosis not present

## 2021-10-17 DIAGNOSIS — Z23 Encounter for immunization: Secondary | ICD-10-CM | POA: Diagnosis not present

## 2021-10-26 ENCOUNTER — Ambulatory Visit: Payer: Medicare HMO

## 2021-10-27 ENCOUNTER — Ambulatory Visit: Payer: Medicare HMO

## 2021-10-27 DIAGNOSIS — M9904 Segmental and somatic dysfunction of sacral region: Secondary | ICD-10-CM | POA: Diagnosis not present

## 2021-10-27 DIAGNOSIS — M9903 Segmental and somatic dysfunction of lumbar region: Secondary | ICD-10-CM | POA: Diagnosis not present

## 2021-10-27 DIAGNOSIS — M5136 Other intervertebral disc degeneration, lumbar region: Secondary | ICD-10-CM | POA: Diagnosis not present

## 2021-10-27 DIAGNOSIS — M9905 Segmental and somatic dysfunction of pelvic region: Secondary | ICD-10-CM | POA: Diagnosis not present

## 2021-10-29 DIAGNOSIS — M810 Age-related osteoporosis without current pathological fracture: Secondary | ICD-10-CM | POA: Diagnosis not present

## 2021-11-19 ENCOUNTER — Other Ambulatory Visit (HOSPITAL_COMMUNITY): Payer: Self-pay

## 2021-11-20 ENCOUNTER — Ambulatory Visit (HOSPITAL_COMMUNITY)
Admission: RE | Admit: 2021-11-20 | Discharge: 2021-11-20 | Disposition: A | Discharge status: Home / Self Care | Payer: Medicare HMO | Source: Ambulatory Visit | Attending: Internal Medicine | Admitting: Internal Medicine

## 2021-11-20 ENCOUNTER — Other Ambulatory Visit: Payer: Self-pay

## 2021-11-20 DIAGNOSIS — M810 Age-related osteoporosis without current pathological fracture: Secondary | ICD-10-CM | POA: Insufficient documentation

## 2021-11-20 MED ORDER — DENOSUMAB 60 MG/ML ~~LOC~~ SOSY
PREFILLED_SYRINGE | SUBCUTANEOUS | Status: AC
  Filled 2021-11-20: qty 1

## 2021-11-20 MED ORDER — DENOSUMAB 60 MG/ML ~~LOC~~ SOSY
60.0000 mg | PREFILLED_SYRINGE | Freq: Once | SUBCUTANEOUS | Status: AC
  Administered 2021-11-20: 60 mg via SUBCUTANEOUS

## 2021-11-27 ENCOUNTER — Ambulatory Visit
Admission: RE | Admit: 2021-11-27 | Discharge: 2021-11-27 | Disposition: A | Discharge status: Home / Self Care | Payer: Medicare HMO | Source: Ambulatory Visit | Attending: Internal Medicine | Admitting: Internal Medicine

## 2021-11-27 DIAGNOSIS — Z1231 Encounter for screening mammogram for malignant neoplasm of breast: Secondary | ICD-10-CM | POA: Diagnosis not present

## 2021-12-28 DIAGNOSIS — M810 Age-related osteoporosis without current pathological fracture: Secondary | ICD-10-CM | POA: Diagnosis not present

## 2021-12-28 DIAGNOSIS — M797 Fibromyalgia: Secondary | ICD-10-CM | POA: Diagnosis not present

## 2021-12-28 DIAGNOSIS — K5903 Drug induced constipation: Secondary | ICD-10-CM | POA: Diagnosis not present

## 2021-12-28 DIAGNOSIS — I10 Essential (primary) hypertension: Secondary | ICD-10-CM | POA: Diagnosis not present

## 2021-12-28 DIAGNOSIS — H4051X1 Glaucoma secondary to other eye disorders, right eye, mild stage: Secondary | ICD-10-CM | POA: Diagnosis not present

## 2021-12-28 DIAGNOSIS — E785 Hyperlipidemia, unspecified: Secondary | ICD-10-CM | POA: Diagnosis not present

## 2021-12-28 DIAGNOSIS — D509 Iron deficiency anemia, unspecified: Secondary | ICD-10-CM | POA: Diagnosis not present

## 2021-12-28 DIAGNOSIS — D692 Other nonthrombocytopenic purpura: Secondary | ICD-10-CM | POA: Diagnosis not present

## 2021-12-28 DIAGNOSIS — E559 Vitamin D deficiency, unspecified: Secondary | ICD-10-CM | POA: Diagnosis not present

## 2021-12-28 DIAGNOSIS — M1990 Unspecified osteoarthritis, unspecified site: Secondary | ICD-10-CM | POA: Diagnosis not present

## 2021-12-28 DIAGNOSIS — E871 Hypo-osmolality and hyponatremia: Secondary | ICD-10-CM | POA: Diagnosis not present

## 2022-02-10 DIAGNOSIS — M9903 Segmental and somatic dysfunction of lumbar region: Secondary | ICD-10-CM | POA: Diagnosis not present

## 2022-02-10 DIAGNOSIS — M9905 Segmental and somatic dysfunction of pelvic region: Secondary | ICD-10-CM | POA: Diagnosis not present

## 2022-02-10 DIAGNOSIS — M5136 Other intervertebral disc degeneration, lumbar region: Secondary | ICD-10-CM | POA: Diagnosis not present

## 2022-02-10 DIAGNOSIS — M9904 Segmental and somatic dysfunction of sacral region: Secondary | ICD-10-CM | POA: Diagnosis not present

## 2022-02-17 DIAGNOSIS — M5136 Other intervertebral disc degeneration, lumbar region: Secondary | ICD-10-CM | POA: Diagnosis not present

## 2022-02-17 DIAGNOSIS — M9905 Segmental and somatic dysfunction of pelvic region: Secondary | ICD-10-CM | POA: Diagnosis not present

## 2022-02-17 DIAGNOSIS — M9904 Segmental and somatic dysfunction of sacral region: Secondary | ICD-10-CM | POA: Diagnosis not present

## 2022-02-17 DIAGNOSIS — M9903 Segmental and somatic dysfunction of lumbar region: Secondary | ICD-10-CM | POA: Diagnosis not present

## 2022-02-24 DIAGNOSIS — M9905 Segmental and somatic dysfunction of pelvic region: Secondary | ICD-10-CM | POA: Diagnosis not present

## 2022-02-24 DIAGNOSIS — M5136 Other intervertebral disc degeneration, lumbar region: Secondary | ICD-10-CM | POA: Diagnosis not present

## 2022-02-24 DIAGNOSIS — M9903 Segmental and somatic dysfunction of lumbar region: Secondary | ICD-10-CM | POA: Diagnosis not present

## 2022-02-24 DIAGNOSIS — M9904 Segmental and somatic dysfunction of sacral region: Secondary | ICD-10-CM | POA: Diagnosis not present

## 2022-03-19 DIAGNOSIS — M810 Age-related osteoporosis without current pathological fracture: Secondary | ICD-10-CM | POA: Diagnosis not present

## 2022-03-19 DIAGNOSIS — Z833 Family history of diabetes mellitus: Secondary | ICD-10-CM | POA: Diagnosis not present

## 2022-03-19 DIAGNOSIS — Z823 Family history of stroke: Secondary | ICD-10-CM | POA: Diagnosis not present

## 2022-03-19 DIAGNOSIS — Z8249 Family history of ischemic heart disease and other diseases of the circulatory system: Secondary | ICD-10-CM | POA: Diagnosis not present

## 2022-03-19 DIAGNOSIS — R69 Illness, unspecified: Secondary | ICD-10-CM | POA: Diagnosis not present

## 2022-03-19 DIAGNOSIS — Z79891 Long term (current) use of opiate analgesic: Secondary | ICD-10-CM | POA: Diagnosis not present

## 2022-03-19 DIAGNOSIS — Z008 Encounter for other general examination: Secondary | ICD-10-CM | POA: Diagnosis not present

## 2022-03-19 DIAGNOSIS — Z79620 Long term (current) use of immunosuppressive biologic: Secondary | ICD-10-CM | POA: Diagnosis not present

## 2022-03-19 DIAGNOSIS — E785 Hyperlipidemia, unspecified: Secondary | ICD-10-CM | POA: Diagnosis not present

## 2022-03-19 DIAGNOSIS — K219 Gastro-esophageal reflux disease without esophagitis: Secondary | ICD-10-CM | POA: Diagnosis not present

## 2022-03-19 DIAGNOSIS — I10 Essential (primary) hypertension: Secondary | ICD-10-CM | POA: Diagnosis not present

## 2022-03-19 DIAGNOSIS — I739 Peripheral vascular disease, unspecified: Secondary | ICD-10-CM | POA: Diagnosis not present

## 2022-03-19 DIAGNOSIS — H409 Unspecified glaucoma: Secondary | ICD-10-CM | POA: Diagnosis not present

## 2022-04-28 DIAGNOSIS — H4051X1 Glaucoma secondary to other eye disorders, right eye, mild stage: Secondary | ICD-10-CM | POA: Diagnosis not present

## 2022-04-28 DIAGNOSIS — Z961 Presence of intraocular lens: Secondary | ICD-10-CM | POA: Diagnosis not present

## 2022-04-28 DIAGNOSIS — T8529XD Other mechanical complication of intraocular lens, subsequent encounter: Secondary | ICD-10-CM | POA: Diagnosis not present

## 2022-04-28 DIAGNOSIS — H18513 Endothelial corneal dystrophy, bilateral: Secondary | ICD-10-CM | POA: Diagnosis not present

## 2022-04-28 DIAGNOSIS — H04221 Epiphora due to insufficient drainage, right lacrimal gland: Secondary | ICD-10-CM | POA: Diagnosis not present

## 2022-05-03 ENCOUNTER — Encounter (HOSPITAL_COMMUNITY): Payer: Medicare HMO

## 2022-05-07 DIAGNOSIS — Z0100 Encounter for examination of eyes and vision without abnormal findings: Secondary | ICD-10-CM | POA: Diagnosis not present

## 2022-06-02 ENCOUNTER — Other Ambulatory Visit (HOSPITAL_COMMUNITY): Payer: Self-pay | Admitting: *Deleted

## 2022-06-03 ENCOUNTER — Inpatient Hospital Stay (HOSPITAL_COMMUNITY): Admission: RE | Admit: 2022-06-03 | Payer: Medicare HMO | Source: Ambulatory Visit

## 2022-06-04 ENCOUNTER — Ambulatory Visit (HOSPITAL_COMMUNITY)
Admission: RE | Admit: 2022-06-04 | Discharge: 2022-06-04 | Disposition: A | Discharge status: Home / Self Care | Payer: Medicare HMO | Source: Ambulatory Visit | Attending: Internal Medicine | Admitting: Internal Medicine

## 2022-06-04 DIAGNOSIS — M810 Age-related osteoporosis without current pathological fracture: Secondary | ICD-10-CM | POA: Diagnosis not present

## 2022-06-04 MED ORDER — DENOSUMAB 60 MG/ML ~~LOC~~ SOSY
60.0000 mg | PREFILLED_SYRINGE | Freq: Once | SUBCUTANEOUS | Status: AC
  Administered 2022-06-04: 60 mg via SUBCUTANEOUS

## 2022-06-04 MED ORDER — DENOSUMAB 60 MG/ML ~~LOC~~ SOSY
PREFILLED_SYRINGE | SUBCUTANEOUS | Status: AC
  Filled 2022-06-04: qty 1

## 2022-07-08 DIAGNOSIS — E785 Hyperlipidemia, unspecified: Secondary | ICD-10-CM | POA: Diagnosis not present

## 2022-07-08 DIAGNOSIS — I10 Essential (primary) hypertension: Secondary | ICD-10-CM | POA: Diagnosis not present

## 2022-07-08 DIAGNOSIS — E559 Vitamin D deficiency, unspecified: Secondary | ICD-10-CM | POA: Diagnosis not present

## 2022-07-08 DIAGNOSIS — R7989 Other specified abnormal findings of blood chemistry: Secondary | ICD-10-CM | POA: Diagnosis not present

## 2022-07-08 DIAGNOSIS — D509 Iron deficiency anemia, unspecified: Secondary | ICD-10-CM | POA: Diagnosis not present

## 2022-07-15 DIAGNOSIS — Z0000 Encounter for general adult medical examination without abnormal findings: Secondary | ICD-10-CM | POA: Diagnosis not present

## 2022-07-15 DIAGNOSIS — M797 Fibromyalgia: Secondary | ICD-10-CM | POA: Diagnosis not present

## 2022-07-15 DIAGNOSIS — R69 Illness, unspecified: Secondary | ICD-10-CM | POA: Diagnosis not present

## 2022-07-15 DIAGNOSIS — I4510 Unspecified right bundle-branch block: Secondary | ICD-10-CM | POA: Diagnosis not present

## 2022-07-15 DIAGNOSIS — I6990 Unspecified sequelae of unspecified cerebrovascular disease: Secondary | ICD-10-CM | POA: Diagnosis not present

## 2022-07-15 DIAGNOSIS — E559 Vitamin D deficiency, unspecified: Secondary | ICD-10-CM | POA: Diagnosis not present

## 2022-07-15 DIAGNOSIS — E785 Hyperlipidemia, unspecified: Secondary | ICD-10-CM | POA: Diagnosis not present

## 2022-07-15 DIAGNOSIS — I10 Essential (primary) hypertension: Secondary | ICD-10-CM | POA: Diagnosis not present

## 2022-07-15 DIAGNOSIS — H409 Unspecified glaucoma: Secondary | ICD-10-CM | POA: Diagnosis not present

## 2022-07-15 DIAGNOSIS — R82998 Other abnormal findings in urine: Secondary | ICD-10-CM | POA: Diagnosis not present

## 2022-07-15 DIAGNOSIS — J309 Allergic rhinitis, unspecified: Secondary | ICD-10-CM | POA: Diagnosis not present

## 2022-07-15 DIAGNOSIS — D509 Iron deficiency anemia, unspecified: Secondary | ICD-10-CM | POA: Diagnosis not present

## 2022-07-15 DIAGNOSIS — M810 Age-related osteoporosis without current pathological fracture: Secondary | ICD-10-CM | POA: Diagnosis not present

## 2022-10-06 DIAGNOSIS — L812 Freckles: Secondary | ICD-10-CM | POA: Diagnosis not present

## 2022-10-06 DIAGNOSIS — L821 Other seborrheic keratosis: Secondary | ICD-10-CM | POA: Diagnosis not present

## 2022-10-06 DIAGNOSIS — D1801 Hemangioma of skin and subcutaneous tissue: Secondary | ICD-10-CM | POA: Diagnosis not present

## 2022-10-20 DIAGNOSIS — M9903 Segmental and somatic dysfunction of lumbar region: Secondary | ICD-10-CM | POA: Diagnosis not present

## 2022-10-20 DIAGNOSIS — M5136 Other intervertebral disc degeneration, lumbar region: Secondary | ICD-10-CM | POA: Diagnosis not present

## 2022-10-20 DIAGNOSIS — M9904 Segmental and somatic dysfunction of sacral region: Secondary | ICD-10-CM | POA: Diagnosis not present

## 2022-10-20 DIAGNOSIS — M9905 Segmental and somatic dysfunction of pelvic region: Secondary | ICD-10-CM | POA: Diagnosis not present

## 2022-10-25 DIAGNOSIS — M9903 Segmental and somatic dysfunction of lumbar region: Secondary | ICD-10-CM | POA: Diagnosis not present

## 2022-10-25 DIAGNOSIS — M9904 Segmental and somatic dysfunction of sacral region: Secondary | ICD-10-CM | POA: Diagnosis not present

## 2022-10-25 DIAGNOSIS — M9905 Segmental and somatic dysfunction of pelvic region: Secondary | ICD-10-CM | POA: Diagnosis not present

## 2022-10-25 DIAGNOSIS — M5136 Other intervertebral disc degeneration, lumbar region: Secondary | ICD-10-CM | POA: Diagnosis not present

## 2022-11-08 DIAGNOSIS — H4051X1 Glaucoma secondary to other eye disorders, right eye, mild stage: Secondary | ICD-10-CM | POA: Diagnosis not present

## 2022-11-10 DIAGNOSIS — M5136 Other intervertebral disc degeneration, lumbar region: Secondary | ICD-10-CM | POA: Diagnosis not present

## 2022-11-10 DIAGNOSIS — M4180 Other forms of scoliosis, site unspecified: Secondary | ICD-10-CM | POA: Diagnosis not present

## 2022-11-10 DIAGNOSIS — Z96652 Presence of left artificial knee joint: Secondary | ICD-10-CM | POA: Diagnosis not present

## 2022-11-10 DIAGNOSIS — M25552 Pain in left hip: Secondary | ICD-10-CM | POA: Diagnosis not present

## 2022-11-10 DIAGNOSIS — M25562 Pain in left knee: Secondary | ICD-10-CM | POA: Diagnosis not present

## 2022-11-16 ENCOUNTER — Other Ambulatory Visit: Payer: Self-pay | Admitting: Internal Medicine

## 2022-11-16 DIAGNOSIS — Z1231 Encounter for screening mammogram for malignant neoplasm of breast: Secondary | ICD-10-CM

## 2023-01-07 DIAGNOSIS — I10 Essential (primary) hypertension: Secondary | ICD-10-CM | POA: Diagnosis not present

## 2023-01-07 DIAGNOSIS — M1990 Unspecified osteoarthritis, unspecified site: Secondary | ICD-10-CM | POA: Diagnosis not present

## 2023-01-07 DIAGNOSIS — M810 Age-related osteoporosis without current pathological fracture: Secondary | ICD-10-CM | POA: Diagnosis not present

## 2023-01-07 DIAGNOSIS — I6990 Unspecified sequelae of unspecified cerebrovascular disease: Secondary | ICD-10-CM | POA: Diagnosis not present

## 2023-01-07 DIAGNOSIS — M797 Fibromyalgia: Secondary | ICD-10-CM | POA: Diagnosis not present

## 2023-01-07 DIAGNOSIS — R69 Illness, unspecified: Secondary | ICD-10-CM | POA: Diagnosis not present

## 2023-01-07 DIAGNOSIS — E785 Hyperlipidemia, unspecified: Secondary | ICD-10-CM | POA: Diagnosis not present

## 2023-01-07 DIAGNOSIS — I4510 Unspecified right bundle-branch block: Secondary | ICD-10-CM | POA: Diagnosis not present

## 2023-01-07 DIAGNOSIS — E559 Vitamin D deficiency, unspecified: Secondary | ICD-10-CM | POA: Diagnosis not present

## 2023-01-07 DIAGNOSIS — K5903 Drug induced constipation: Secondary | ICD-10-CM | POA: Diagnosis not present

## 2023-01-07 DIAGNOSIS — D509 Iron deficiency anemia, unspecified: Secondary | ICD-10-CM | POA: Diagnosis not present

## 2023-01-07 DIAGNOSIS — E871 Hypo-osmolality and hyponatremia: Secondary | ICD-10-CM | POA: Diagnosis not present

## 2023-01-12 ENCOUNTER — Ambulatory Visit
Admission: RE | Admit: 2023-01-12 | Discharge: 2023-01-12 | Disposition: A | Discharge status: Home / Self Care | Payer: Medicare HMO | Source: Ambulatory Visit | Attending: Internal Medicine | Admitting: Internal Medicine

## 2023-01-12 DIAGNOSIS — Z1231 Encounter for screening mammogram for malignant neoplasm of breast: Secondary | ICD-10-CM

## 2023-02-18 ENCOUNTER — Encounter (HOSPITAL_BASED_OUTPATIENT_CLINIC_OR_DEPARTMENT_OTHER): Payer: Self-pay | Admitting: *Deleted

## 2023-02-18 ENCOUNTER — Emergency Department (HOSPITAL_BASED_OUTPATIENT_CLINIC_OR_DEPARTMENT_OTHER): Payer: Medicare HMO

## 2023-02-18 ENCOUNTER — Other Ambulatory Visit: Payer: Self-pay

## 2023-02-18 ENCOUNTER — Emergency Department (HOSPITAL_BASED_OUTPATIENT_CLINIC_OR_DEPARTMENT_OTHER)
Admission: EM | Admit: 2023-02-18 | Discharge: 2023-02-18 | Disposition: A | Discharge status: Home / Self Care | Payer: Medicare HMO | Attending: Emergency Medicine | Admitting: Emergency Medicine

## 2023-02-18 DIAGNOSIS — I6789 Other cerebrovascular disease: Secondary | ICD-10-CM | POA: Diagnosis not present

## 2023-02-18 DIAGNOSIS — Y9301 Activity, walking, marching and hiking: Secondary | ICD-10-CM | POA: Insufficient documentation

## 2023-02-18 DIAGNOSIS — S0990XA Unspecified injury of head, initial encounter: Secondary | ICD-10-CM | POA: Diagnosis not present

## 2023-02-18 DIAGNOSIS — E876 Hypokalemia: Secondary | ICD-10-CM | POA: Insufficient documentation

## 2023-02-18 DIAGNOSIS — W19XXXA Unspecified fall, initial encounter: Secondary | ICD-10-CM | POA: Diagnosis not present

## 2023-02-18 DIAGNOSIS — Z79899 Other long term (current) drug therapy: Secondary | ICD-10-CM | POA: Insufficient documentation

## 2023-02-18 DIAGNOSIS — Z8673 Personal history of transient ischemic attack (TIA), and cerebral infarction without residual deficits: Secondary | ICD-10-CM | POA: Diagnosis not present

## 2023-02-18 DIAGNOSIS — Y92009 Unspecified place in unspecified non-institutional (private) residence as the place of occurrence of the external cause: Secondary | ICD-10-CM | POA: Insufficient documentation

## 2023-02-18 DIAGNOSIS — I4510 Unspecified right bundle-branch block: Secondary | ICD-10-CM | POA: Insufficient documentation

## 2023-02-18 DIAGNOSIS — Z7901 Long term (current) use of anticoagulants: Secondary | ICD-10-CM | POA: Diagnosis not present

## 2023-02-18 DIAGNOSIS — I10 Essential (primary) hypertension: Secondary | ICD-10-CM | POA: Diagnosis not present

## 2023-02-18 DIAGNOSIS — W1830XA Fall on same level, unspecified, initial encounter: Secondary | ICD-10-CM | POA: Insufficient documentation

## 2023-02-18 DIAGNOSIS — Z743 Need for continuous supervision: Secondary | ICD-10-CM | POA: Diagnosis not present

## 2023-02-18 DIAGNOSIS — R9431 Abnormal electrocardiogram [ECG] [EKG]: Secondary | ICD-10-CM | POA: Diagnosis not present

## 2023-02-18 LAB — CBC WITH DIFFERENTIAL/PLATELET
Abs Immature Granulocytes: 0.03 10*3/uL (ref 0.00–0.07)
Basophils Absolute: 0 10*3/uL (ref 0.0–0.1)
Basophils Relative: 0 %
Eosinophils Absolute: 0 10*3/uL (ref 0.0–0.5)
Eosinophils Relative: 0 %
HCT: 32.8 % — ABNORMAL LOW (ref 36.0–46.0)
Hemoglobin: 10.2 g/dL — ABNORMAL LOW (ref 12.0–15.0)
Immature Granulocytes: 0 %
Lymphocytes Relative: 9 %
Lymphs Abs: 0.9 10*3/uL (ref 0.7–4.0)
MCH: 26.4 pg (ref 26.0–34.0)
MCHC: 31.1 g/dL (ref 30.0–36.0)
MCV: 85 fL (ref 80.0–100.0)
Monocytes Absolute: 0.8 10*3/uL (ref 0.1–1.0)
Monocytes Relative: 7 %
Neutro Abs: 8.7 10*3/uL — ABNORMAL HIGH (ref 1.7–7.7)
Neutrophils Relative %: 84 %
Platelets: 358 10*3/uL (ref 150–400)
RBC: 3.86 MIL/uL — ABNORMAL LOW (ref 3.87–5.11)
RDW: 14.6 % (ref 11.5–15.5)
WBC: 10.4 10*3/uL (ref 4.0–10.5)
nRBC: 0 % (ref 0.0–0.2)

## 2023-02-18 LAB — CBG MONITORING, ED: Glucose-Capillary: 108 mg/dL — ABNORMAL HIGH (ref 70–99)

## 2023-02-18 LAB — BASIC METABOLIC PANEL
Anion gap: 7 (ref 5–15)
BUN: 8 mg/dL (ref 8–23)
CO2: 29 mmol/L (ref 22–32)
Calcium: 10.8 mg/dL — ABNORMAL HIGH (ref 8.9–10.3)
Chloride: 96 mmol/L — ABNORMAL LOW (ref 98–111)
Creatinine, Ser: 0.51 mg/dL (ref 0.44–1.00)
GFR, Estimated: >60 mL/min (ref >60)
Glucose, Bld: 104 mg/dL — ABNORMAL HIGH (ref 70–99)
Potassium: 2.8 mmol/L — ABNORMAL LOW (ref 3.5–5.1)
Sodium: 132 mmol/L — ABNORMAL LOW (ref 135–145)

## 2023-02-18 LAB — URINALYSIS, ROUTINE W REFLEX MICROSCOPIC
Bilirubin Urine: NEGATIVE
Glucose, UA: NEGATIVE mg/dL
Hgb urine dipstick: NEGATIVE
Ketones, ur: NEGATIVE mg/dL
Nitrite: NEGATIVE
Protein, ur: NEGATIVE mg/dL
Specific Gravity, Urine: 1.015 (ref 1.005–1.030)
pH: 7 (ref 5.0–8.0)

## 2023-02-18 LAB — URINALYSIS, MICROSCOPIC (REFLEX): RBC / HPF: NONE SEEN RBC/hpf (ref 0–5)

## 2023-02-18 MED ORDER — POTASSIUM CHLORIDE CRYS ER 20 MEQ PO TBCR: 20.0000 meq | EXTENDED_RELEASE_TABLET | Freq: Every day | ORAL | 0 refills | Status: AC

## 2023-02-18 MED ORDER — POTASSIUM CHLORIDE CRYS ER 20 MEQ PO TBCR
40.0000 meq | EXTENDED_RELEASE_TABLET | Freq: Once | ORAL | Status: AC
  Administered 2023-02-18: 40 meq via ORAL
  Filled 2023-02-18: qty 2

## 2023-02-18 NOTE — ED Triage Notes (Signed)
Pt fell at home and was on ground for about an hour until ems came to help her up.  Pt states that she did hit her head, no LOC.  Pt states that her legs just locked up and she fell.  EMS helped her up and she fell again, EMS was able to lower her to the gound and they state that her legs seemed to "lock up" and pt states that she lost balance, no dizziness, no focal weakness.  No injuries reported.

## 2023-02-18 NOTE — ED Provider Notes (Signed)
[facility] Provider Note   CSN: [id number] Arrival date & time: 02/18/23  1702     History  Chief Complaint  Patient presents with   Terri Shah    Terri Shah is a 89 y.o. female with HLD, anemia, GERD, arthritis, HTN, history of lower GI bleed, who presents with fall at home.   Patient is brought in by EMS was complaining of fall at home.  She states that she was walking in her home and thinks that she and her walker got crossways and her legs locked up and she fell onto the ground.  She denies any syncope, but does states that she hit her head on the ground on the right side when she fell.  Denies any loss of consciousness.  She did not have the strength to get up up off the ground by herself and had left her emergency button in her purse where she could not reach it.  Eventually was able to get to a phone and call 911.  Was on the ground for approximately 1 hour before EMS arrived.  Patient has no headache, neck pain, soreness or injuries anywhere as a result of the fall.  She denies any numbness tingling, dizziness, lightheadedness, asymmetric weakness.  Denies any recent illnesses, fever/chills, chest pain, shortness of breath, nausea vomiting diarrhea constipation, urinary symptoms, lower extremity edema, hematochezia/melena. Does not take a blood thinner.    Fall       Home Medications Prior to Admission medications   Medication Sig Start Date End Date Taking? Authorizing Provider  potassium chloride SA (KLOR-CON M) 20 MEQ tablet Take 1 tablet (20 mEq total) by mouth daily for 3 days. 02/18/23 02/21/23 Yes Audley Hose, MD  amitriptyline (ELAVIL) 25 MG tablet Take 25 mg by mouth at bedtime as needed for sleep.     [provider]  atorvastatin (LIPITOR) 20 MG tablet Take 20 mg by mouth daily.      [provider]  benazepril (LOTENSIN) 20 MG tablet Take 20 mg by mouth daily.    [provider]  calcium  carbonate (TUMS EX) 750 MG chewable tablet Chew 1 tablet by mouth daily as needed for heartburn.    [provider]  Camphor-Menthol-Methyl Sal (SALONPAS EX) Apply 1 application topically daily as needed (pain.).    [provider]  denosumab (PROLIA) 60 MG/ML SOLN injection Inject 60 mg into the skin every 6 (six) months. Administer in upper arm, thigh, or abdomen    [provider]  docusate sodium (COLACE) 100 MG capsule Take 1 capsule (100 mg total) by mouth 2 (two) times daily. 03/14/20   [name], PA-C  dorzolamide-timolol (COSOPT) 22.3-6.8 MG/ML ophthalmic solution Place 1 drop into the right eye 2 (two) times daily.  11/20/14   [provider]  Ferrous Sulfate (SLOW FE) 142 (45 Fe) MG TBCR Take 45 mg by mouth every other day.     [provider]  fluticasone (FLONASE) 50 MCG/ACT nasal spray Place 1 spray into both nostrils every other day.    [provider]  hydrochlorothiazide (HYDRODIURIL) 25 MG tablet Take 25 mg by mouth every other day.  10/08/15   [provider]  HYDROcodone-acetaminophen (NORCO) 5-325 MG tablet Take 1-2 tablets by mouth every 4 (four) hours as needed for moderate pain or severe pain. 03/14/20   [name], PA-C  latanoprost (XALATAN) 0.005 % ophthalmic solution Place 1 drop into the right eye at  bedtime.     [provider]  loratadine (CLARITIN) 10 MG tablet Take 10 mg by mouth every other day.     [provider]  Magnesium 250 MG TABS Take 250 mg by mouth every other day.     [provider]  Menthol, Topical Analgesic, (BIOFREEZE ROLL-ON EX) Apply 1 application topically 3 (three) times daily as needed (pain.).     [provider]  methocarbamol (ROBAXIN) 500 MG tablet Take 1 tablet (500 mg total) by mouth every 6 (six) hours as needed for muscle spasms. 03/14/20   [name], PA-C  omeprazole (PRILOSEC) 20 MG capsule Take 20 mg by mouth daily.      [provider]  polyethylene glycol (MIRALAX / GLYCOLAX) 17 g packet Take 17 g by mouth 2 (two) times daily. 03/14/20   [name], PA-C  polyvinyl alcohol (LIQUIFILM TEARS) 1.4 % ophthalmic solution Place 1 drop into both eyes 3 (three) times daily as needed for dry eyes.     [provider]  prednisoLONE acetate (PRED FORTE) 1 % ophthalmic suspension Place 1 drop into the right eye every other day.  07/31/18   [provider]  Psyllium (METAMUCIL PO) Take 1 each by mouth daily. 1 heaping teaspoon daily    [provider]  rivaroxaban (XARELTO) 10 MG TABS tablet Take 1 tablet (10 mg total) by mouth daily. 03/14/20   [name], PA-C  Saline 2.65 % SOLN Place 1 each into the nose daily as needed (congestion).    [provider]  vitamin B-12 (CYANOCOBALAMIN) 1000 MCG tablet Take 1,000 mcg by mouth daily.    [provider]  Vitamin D, Cholecalciferol, 50 MCG (2000 UT) CAPS Take 2,000 Units by mouth daily.     [provider]      Allergies    Codeine    Review of Systems   Review of Systems Review of systems Negative for LOC.  A 10 point review of systems was performed and is negative unless otherwise reported in HPI.  Physical Exam Updated Vital Signs BP (!) 190/82   Pulse 65   Temp 98.4 F (36.9 C) (Oral)   Resp 18   SpO2 98%  Physical Exam General: Normal appearing female, lying in bed.  HEENT: PERRLA, EOMI, Sclera anicteric, MMM, trachea midline. NCAT. No skull depressions/deformities. No scalp lacerations/wounds. Cardiology: RRR, no murmurs/rubs/gallops. BL radial and DP pulses equal bilaterally. No TTP of chest wall, no crepitus or deformities.  Resp: Normal respiratory rate and effort. CTAB, no wheezes, rhonchi, crackles.  Abd: Soft, non-tender, non-distended. No rebound tenderness or guarding.  GU: Deferred. MSK: No peripheral edema or signs of trauma. Extremities without deformity or TTP. No cyanosis or  clubbing. Skin: warm, dry. No rashes or lesions. Back: No mildine C/T/L-spine tenderness to palpation.  Neuro: A&Ox4, CNs II-XII grossly intact. MAEs. Sensation grossly intact.  Psych: Normal mood and affect.   ED Results / Procedures / Treatments   Labs (all labs ordered are listed, but only abnormal results are displayed) Labs Reviewed  CBC WITH DIFFERENTIAL/PLATELET - Abnormal; Notable for the following components:      Result Value   RBC 3.86 (*)    Hemoglobin 10.2 (*)    HCT 32.8 (*)    Neutro Abs 8.7 (*)    All other components within normal limits  BASIC METABOLIC PANEL - Abnormal; Notable for the following components:   Sodium 132 (*)    Potassium 2.8 (*)  Chloride 96 (*)    Glucose, Bld 104 (*)    Calcium 10.8 (*)    All other components within normal limits  URINALYSIS, ROUTINE W REFLEX MICROSCOPIC - Abnormal; Notable for the following components:   Leukocytes,Ua SMALL (*)    All other components within normal limits  URINALYSIS, MICROSCOPIC (REFLEX) - Abnormal; Notable for the following components:   Bacteria, UA RARE (*)    All other components within normal limits  CBG MONITORING, ED - Abnormal; Notable for the following components:   Glucose-Capillary 108 (*)    All other components within normal limits    EKG EKG Interpretation  Date/Time:  Friday February 18 2023 18:48:57 EST Ventricular Rate:  71 PR Interval:    QRS Duration: 140 QT Interval:  427 QTC Calculation: 464 R Axis:   37 Text Interpretation: Junctional rhythm Right bundle branch block Probable inferior infarct, age indeterminate Confirmed by Cindee Lame [id number]) on 02/18/2023 7:45:02 PM  Radiology CTH: IMPRESSION:  Atrophy, chronic microvascular disease.    No acute intracranial abnormality.    Procedures Procedures    Medications Ordered in ED Medications  potassium chloride SA (KLOR-CON M) CR tablet 40 mEq (40 mEq Oral Given 02/18/23 2150)    ED Course/ Medical Decision Making/  A&P                          Medical Decision Making Amount and/or Complexity of Data Reviewed Labs: ordered. Decision-making details documented in ED Course. Radiology: ordered.  Risk Prescription drug management.    This patient presents to the ED for concern of fall with head trauma, this involves an extensive number of treatment options, and is a complaint that carries with it a high risk of complications and morbidity.  I considered the following differential and admission for this acute, potentially life threatening condition.  However patient is overall very well-appearing, asymptomatically hypertensive but otherwise hemodynamically stable.  MDM:    DDX for trauma includes but is not limited to:  -Head Injury such as skull fx or ICH -patient does take Xarelto and did have head trauma today.  Though she has no headache or focal neurodeficits will obtain CT head -Spinal Cord or Vertebral injury -no focal neurodeficits or neck pain.  Ruled out by Nexus criteria. -Fractures -unremarkable extremity exams with no significant pain reported, no concern for extremity fracture rib fractures -For her fall will obtain labs to evaluate for electrolyte derangements, anemia, UTI, hypo-/hyperglycemia, arrhythmia. -EKG did demonstrate junctional rhythm however patient with no signs of ischemia and no chest pain or any symptoms.  She is safe to follow-up outpatient with this.   Clinical Course as of 02/25/23 0013  Fri Feb 18, 2023  1943 Glucose-Capillary(!): 108 [HN]  1943 Hemoglobin(!): 10.2 Increased from prior [HN]  1943 Urinalysis, Routine w reflex microscopic -Urine, Clean Catch(!) No UTI [HN]    Clinical Course User Index [HN] Audley Hose, MD    Labs: I Ordered, and personally interpreted labs.  The pertinent results include: Sodium 132, potassium 2.8, WBC 10.4, hemoglobin 10.2, glucose 104, UA unremarkable  Imaging Studies ordered: I ordered imaging studies including CT  head I independently visualized and interpreted imaging. I agree with the radiologist interpretation  Additional history obtained from chart review.    Cardiac Monitoring: The patient was maintained on a cardiac monitor.  I personally viewed and interpreted the cardiac monitored which showed an underlying rhythm of: Junctional rhythm  Reevaluation: After the  interventions noted above, I reevaluated the patient and found that they have :stayed the same  Social Determinants of Health: Patient lives independently   Disposition: Patient is noted to have hypokalemia to 2.8 which is repleted orally.  She is currently asymptomatic and feels at her baseline.  EKG is also noted to have a junctional rhythm, the patient has no sensation of palpitations, chest pain, shortness of breath, lightheadedness at this time.  She has no ST elevations or depressions to indicate ACS.  During her nearly 5-hour stay in the ED she did not have any symptoms during that time or any other events monitored on telemetry and believes she is stable to be discharged for outpatient follow-up.  Patient is instructed to follow-up with PCP within 1 week to have labs rechecked and EKG taken.  Patient is advised to walk with her walker carefully and keep her emergency button on her at all times.  Patient reports understanding.  All questions answered to patient satisfaction.  Will be discharged with discharge instructions and return precautions.  Co morbidities that complicate the patient evaluation  Past Medical History:  Diagnosis Date   Allergy    Anemia    Arthritis    Barrett esophagus    Barrett's esophagus 11/02/2007   Qualifier: Diagnosis of  By: Gretta Cool RN, Cheryl     Blood transfusion without reported diagnosis    late 20's or early 30's   Cataract    Diverticulosis    ECTOPIC PREGNANCY 02/08/2008   Qualifier: Diagnosis of  By: Gretta Cool RN, Malachy Mood     Family history of adverse reaction to anesthesia    older sister  dont remember what it was    Fibromyalgia    takes elavil   Gastritis    GASTRITIS 11/02/2007   Qualifier: Diagnosis of  By: Gretta Cool RN, Malachy Mood     GERD (gastroesophageal reflux disease)    Glaucoma    right eye   Heart murmur    Hiatal hernia    History of GI diverticular bleed    Hyperlipidemia    Hypertension    Iron deficiency 2020   Iron deficiency anemia    Osteoporosis    Palpitations 04/02/2010   Qualifier: Diagnosis of  By: Angelena Form, MD, Christopher     PONV (postoperative nausea and vomiting)    after c section   Presbyesophagus    Stroke ([place])    "small stroke" 2010- no residual per pt     Medicines Meds ordered this encounter  Medications   potassium chloride SA (KLOR-CON M) CR tablet 40 mEq   potassium chloride SA (KLOR-CON M) 20 MEQ tablet    Sig: Take 1 tablet (20 mEq total) by mouth daily for 3 days.    Dispense:  3 tablet    Refill:  0    I have reviewed the patients home medicines and have made adjustments as needed  Problem List / ED Course: Problem List Items Addressed This Visit   None Visit Diagnoses     Fall, initial encounter    -  Primary   Hypokalemia                       This note was created using dictation software, which may contain spelling or grammatical errors.    Audley Hose, MD 02/25/23 [phone]

## 2023-02-18 NOTE — ED Notes (Signed)
Patient transported to CT 

## 2023-02-18 NOTE — ED Notes (Signed)
Pt ambulated to the bathroom with walker without any difficulties

## 2023-02-18 NOTE — Discharge Instructions (Addendum)
Thank you for coming to [facility] Emergency Department. You were seen for fall. We did an exam, labs, and imaging, and these showed low potassium. We have prescribed 20 mEq of potassium to take every day for 3 days. Please follow up with your primary care provider within 1 week to have labs rechecked. Please also stay well hydrated at home. It is likely you are dehydrated.  Do not hesitate to return to the ED or call 911 if you experience: -Worsening symptoms -Lightheadedness, passing out -Fevers/chills -Anything else that concerns you

## 2023-02-21 ENCOUNTER — Other Ambulatory Visit (HOSPITAL_BASED_OUTPATIENT_CLINIC_OR_DEPARTMENT_OTHER): Payer: Self-pay

## 2023-03-01 DIAGNOSIS — R2689 Other abnormalities of gait and mobility: Secondary | ICD-10-CM | POA: Diagnosis not present

## 2023-03-01 DIAGNOSIS — W19XXXA Unspecified fall, initial encounter: Secondary | ICD-10-CM | POA: Diagnosis not present

## 2023-03-01 DIAGNOSIS — M5416 Radiculopathy, lumbar region: Secondary | ICD-10-CM | POA: Diagnosis not present

## 2023-03-01 DIAGNOSIS — E876 Hypokalemia: Secondary | ICD-10-CM | POA: Diagnosis not present

## 2023-03-01 DIAGNOSIS — R0781 Pleurodynia: Secondary | ICD-10-CM | POA: Diagnosis not present

## 2023-03-09 ENCOUNTER — Emergency Department (HOSPITAL_BASED_OUTPATIENT_CLINIC_OR_DEPARTMENT_OTHER)
Admission: EM | Admit: 2023-03-09 | Discharge: 2023-03-09 | Disposition: A | Discharge status: Home / Self Care | Payer: Medicare HMO | Attending: Emergency Medicine | Admitting: Emergency Medicine

## 2023-03-09 ENCOUNTER — Other Ambulatory Visit: Payer: Self-pay

## 2023-03-09 ENCOUNTER — Encounter (HOSPITAL_BASED_OUTPATIENT_CLINIC_OR_DEPARTMENT_OTHER): Payer: Self-pay | Admitting: Urology

## 2023-03-09 DIAGNOSIS — I10 Essential (primary) hypertension: Secondary | ICD-10-CM

## 2023-03-09 DIAGNOSIS — Z79899 Other long term (current) drug therapy: Secondary | ICD-10-CM | POA: Diagnosis not present

## 2023-03-09 DIAGNOSIS — R799 Abnormal finding of blood chemistry, unspecified: Secondary | ICD-10-CM | POA: Diagnosis not present

## 2023-03-09 DIAGNOSIS — E876 Hypokalemia: Secondary | ICD-10-CM

## 2023-03-09 DIAGNOSIS — E785 Hyperlipidemia, unspecified: Secondary | ICD-10-CM | POA: Diagnosis not present

## 2023-03-09 DIAGNOSIS — E875 Hyperkalemia: Secondary | ICD-10-CM | POA: Insufficient documentation

## 2023-03-09 LAB — URINALYSIS, ROUTINE W REFLEX MICROSCOPIC
Bilirubin Urine: NEGATIVE
Glucose, UA: NEGATIVE mg/dL
Hgb urine dipstick: NEGATIVE
Ketones, ur: NEGATIVE mg/dL
Nitrite: NEGATIVE
Protein, ur: NEGATIVE mg/dL
Specific Gravity, Urine: 1.015 (ref 1.005–1.030)
pH: 7.5 (ref 5.0–8.0)

## 2023-03-09 LAB — COMPREHENSIVE METABOLIC PANEL
ALT: 11 U/L (ref 0–44)
AST: 16 U/L (ref 15–41)
Albumin: 3.6 g/dL (ref 3.5–5.0)
Alkaline Phosphatase: 115 U/L (ref 38–126)
Anion gap: 7 (ref 5–15)
BUN: 10 mg/dL (ref 8–23)
CO2: 26 mmol/L (ref 22–32)
Calcium: 11 mg/dL — ABNORMAL HIGH (ref 8.9–10.3)
Chloride: 95 mmol/L — ABNORMAL LOW (ref 98–111)
Creatinine, Ser: 0.6 mg/dL (ref 0.44–1.00)
GFR, Estimated: >60 mL/min (ref >60)
Glucose, Bld: 107 mg/dL — ABNORMAL HIGH (ref 70–99)
Potassium: 3.3 mmol/L — ABNORMAL LOW (ref 3.5–5.1)
Sodium: 128 mmol/L — ABNORMAL LOW (ref 135–145)
Total Bilirubin: 0.7 mg/dL (ref 0.3–1.2)
Total Protein: 6.9 g/dL (ref 6.5–8.1)

## 2023-03-09 LAB — CBC WITH DIFFERENTIAL/PLATELET
Abs Immature Granulocytes: 0.07 10*3/uL (ref 0.00–0.07)
Basophils Absolute: 0.1 10*3/uL (ref 0.0–0.1)
Basophils Relative: 1 %
Eosinophils Absolute: 0.1 10*3/uL (ref 0.0–0.5)
Eosinophils Relative: 1 %
HCT: 30.9 % — ABNORMAL LOW (ref 36.0–46.0)
Hemoglobin: 9.6 g/dL — ABNORMAL LOW (ref 12.0–15.0)
Immature Granulocytes: 1 %
Lymphocytes Relative: 14 %
Lymphs Abs: 1 10*3/uL (ref 0.7–4.0)
MCH: 26.5 pg (ref 26.0–34.0)
MCHC: 31.1 g/dL (ref 30.0–36.0)
MCV: 85.4 fL (ref 80.0–100.0)
Monocytes Absolute: 0.6 10*3/uL (ref 0.1–1.0)
Monocytes Relative: 9 %
Neutro Abs: 5.5 10*3/uL (ref 1.7–7.7)
Neutrophils Relative %: 74 %
Platelets: 372 10*3/uL (ref 150–400)
RBC: 3.62 MIL/uL — ABNORMAL LOW (ref 3.87–5.11)
RDW: 14.8 % (ref 11.5–15.5)
WBC: 7.3 10*3/uL (ref 4.0–10.5)
nRBC: 0 % (ref 0.0–0.2)

## 2023-03-09 LAB — URINALYSIS, MICROSCOPIC (REFLEX): RBC / HPF: NONE SEEN RBC/hpf (ref 0–5)

## 2023-03-09 MED ORDER — FUROSEMIDE 10 MG/ML IJ SOLN
20.0000 mg | Freq: Once | INTRAMUSCULAR | Status: AC
  Administered 2023-03-09: 20 mg via INTRAVENOUS
  Filled 2023-03-09: qty 2

## 2023-03-09 MED ORDER — SODIUM CHLORIDE 0.9 % IV BOLUS
1000.0000 mL | Freq: Once | INTRAVENOUS | Status: AC
  Administered 2023-03-09: 1000 mL via INTRAVENOUS

## 2023-03-09 MED ORDER — HYDRALAZINE HCL 20 MG/ML IJ SOLN
5.0000 mg | Freq: Once | INTRAMUSCULAR | Status: AC
  Administered 2023-03-09: 5 mg via INTRAVENOUS
  Filled 2023-03-09: qty 1

## 2023-03-09 MED ORDER — POTASSIUM CHLORIDE CRYS ER 20 MEQ PO TBCR
40.0000 meq | EXTENDED_RELEASE_TABLET | Freq: Once | ORAL | Status: AC
  Administered 2023-03-09: 40 meq via ORAL
  Filled 2023-03-09: qty 2

## 2023-03-09 NOTE — ED Triage Notes (Signed)
Pt sent her for abnormal labs with pcp this am  Unknown what lab was elevated  Denies any pain

## 2023-03-09 NOTE — ED Provider Notes (Signed)
[facility]   CSN: [id number] Arrival date & time: 03/09/23  1328     History {Add pertinent medical, surgical, social history, OB history to HPI:1} Chief Complaint  Patient presents with   Abnormal Lab    Terri Shah is a 89 y.o. female.  She was sent in by her primary care doctor after routine lab work done today showed elevated calcium.  She said this is a new problem.  She had a fall a few weeks ago and was seen here.  She said she has been sore and has bruised ribs and has been more painful to ambulate.  She also sees her primary care doctor for elevated blood pressures.  The history is provided by the patient.  Abnormal Lab Time since result:  Hours Patient referred by:  PCP Resulting agency:  External Result type: chemistry   Chemistry:    Calcium:  High      Home Medications Prior to Admission medications   Medication Sig Start Date End Date Taking? Authorizing Provider  amitriptyline (ELAVIL) 25 MG tablet Take 25 mg by mouth at bedtime as needed for sleep.     [provider]  atorvastatin (LIPITOR) 20 MG tablet Take 20 mg by mouth daily.      [provider]  benazepril (LOTENSIN) 20 MG tablet Take 20 mg by mouth daily.    [provider]  calcium carbonate (TUMS EX) 750 MG chewable tablet Chew 1 tablet by mouth daily as needed for heartburn.    [provider]  Camphor-Menthol-Methyl Sal (SALONPAS EX) Apply 1 application topically daily as needed (pain.).    [provider]  denosumab (PROLIA) 60 MG/ML SOLN injection Inject 60 mg into the skin every 6 (six) months. Administer in upper arm, thigh, or abdomen    [provider]  docusate sodium (COLACE) 100 MG capsule Take 1 capsule (100 mg total) by mouth 2 (two) times daily. 03/14/20   [name], PA-C  dorzolamide-timolol (COSOPT) 22.3-6.8 MG/ML ophthalmic solution Place 1 drop into the right eye  2 (two) times daily.  11/20/14   [provider]  Ferrous Sulfate (SLOW FE) 142 (45 Fe) MG TBCR Take 45 mg by mouth every other day.     [provider]  fluticasone (FLONASE) 50 MCG/ACT nasal spray Place 1 spray into both nostrils every other day.    [provider]  hydrochlorothiazide (HYDRODIURIL) 25 MG tablet Take 25 mg by mouth every other day.  10/08/15   [provider]  HYDROcodone-acetaminophen (NORCO) 5-325 MG tablet Take 1-2 tablets by mouth every 4 (four) hours as needed for moderate pain or severe pain. 03/14/20   [name], PA-C  latanoprost (XALATAN) 0.005 % ophthalmic solution Place 1 drop into the right eye at bedtime.     [provider]  loratadine (CLARITIN) 10 MG tablet Take 10 mg by mouth every other day.     [provider]  Magnesium 250 MG TABS Take 250 mg by mouth every other day.     [provider]  Menthol, Topical Analgesic, (BIOFREEZE ROLL-ON EX) Apply 1 application topically 3 (three) times daily as needed (pain.).     [provider]  methocarbamol (ROBAXIN) 500 MG tablet Take 1 tablet (500 mg total) by mouth every 6 (six) hours as needed for muscle spasms. 03/14/20   [name], PA-C  omeprazole (PRILOSEC) 20 MG capsule Take 20 mg by mouth daily.  [provider]  polyethylene glycol (MIRALAX / GLYCOLAX) 17 g packet Take 17 g by mouth 2 (two) times daily. 03/14/20   [name], PA-C  polyvinyl alcohol (LIQUIFILM TEARS) 1.4 % ophthalmic solution Place 1 drop into both eyes 3 (three) times daily as needed for dry eyes.     [provider]  potassium chloride SA (KLOR-CON M) 20 MEQ tablet Take 1 tablet (20 mEq total) by mouth daily for 3 days. 02/18/23 02/21/23  Audley Hose, MD  prednisoLONE acetate (PRED FORTE) 1 % ophthalmic suspension Place 1 drop into the right eye every other day.  07/31/18   [provider]  Psyllium (METAMUCIL PO) Take 1 each by mouth  daily. 1 heaping teaspoon daily    [provider]  rivaroxaban (XARELTO) 10 MG TABS tablet Take 1 tablet (10 mg total) by mouth daily. 03/14/20   [name], PA-C  Saline 2.65 % SOLN Place 1 each into the nose daily as needed (congestion).    [provider]  vitamin B-12 (CYANOCOBALAMIN) 1000 MCG tablet Take 1,000 mcg by mouth daily.    [provider]  Vitamin D, Cholecalciferol, 50 MCG (2000 UT) CAPS Take 2,000 Units by mouth daily.     [provider]      Allergies    Codeine    Review of Systems   Review of Systems  Constitutional:  Negative for fever.  HENT:  Negative for sore throat.   Eyes:  Negative for visual disturbance.  Respiratory:  Negative for shortness of breath.   Cardiovascular:  Negative for chest pain.  Gastrointestinal:  Negative for abdominal pain.  Genitourinary:  Negative for dysuria.  Skin:  Negative for rash.    Physical Exam Updated Vital Signs BP (!) 160/80 (BP Location: Right Arm)   Pulse 65   Temp 98.4 F (36.9 C)   Resp 18   Ht 5\' 1"  (1.549 m)   Wt 57.7 kg   SpO2 93%   BMI 24.04 kg/m  Physical Exam Vitals and nursing note reviewed.  Constitutional:      General: She is not in acute distress.    Appearance: Normal appearance. She is well-developed.  HENT:     Head: Normocephalic and atraumatic.  Eyes:     Conjunctiva/sclera: Conjunctivae normal.  Cardiovascular:     Rate and Rhythm: Normal rate and regular rhythm.     Heart sounds: No murmur heard. Pulmonary:     Effort: Pulmonary effort is normal. No respiratory distress.     Breath sounds: Normal breath sounds.  Abdominal:     Palpations: Abdomen is soft.     Tenderness: There is no abdominal tenderness. There is no guarding or rebound.  Musculoskeletal:        General: No deformity.     Cervical back: Neck supple.  Skin:    General: Skin is warm and dry.     Capillary Refill: Capillary refill takes less than 2 seconds.  Neurological:      Mental Status: She is alert.     Sensory: No sensory deficit.     Motor: No weakness.     ED Results / Procedures / Treatments   Labs (all labs ordered are listed, but only abnormal results are displayed) Labs Reviewed  CBC WITH DIFFERENTIAL/PLATELET - Abnormal; Notable for the following components:      Result Value   RBC 3.62 (*)    Hemoglobin 9.6 (*)    HCT 30.9 (*)  All other components within normal limits  COMPREHENSIVE METABOLIC PANEL - Abnormal; Notable for the following components:   Sodium 128 (*)    Potassium 3.3 (*)    Chloride 95 (*)    Glucose, Bld 107 (*)    Calcium 11.0 (*)    All other components within normal limits  URINALYSIS, ROUTINE W REFLEX MICROSCOPIC    EKG EKG Interpretation  Date/Time:  Wednesday March 09 2023 13:46:35 EDT Ventricular Rate:  64 PR Interval:  154 QRS Duration: 142 QT Interval:  410 QTC Calculation: 423 R Axis:   73 Text Interpretation: Sinus rhythm Confirmed by Lennice Sites ([id number]) on 03/09/2023 1:49:10 PM  Radiology No results found.  Procedures Procedures  {Document cardiac monitor, telemetry assessment procedure when appropriate:1}  Medications Ordered in ED Medications  sodium chloride 0.9 % bolus 1,000 mL (has no administration in time range)    ED Course/ Medical Decision Making/ A&P   {   Click here for ABCD2, HEART and other calculatorsREFRESH Note before signing :1}                          Medical Decision Making  This patient complains of ***; this involves an extensive number of treatment Options and is a complaint that carries with it a high risk of complications and morbidity. The differential includes ***  I ordered, reviewed and interpreted labs, which included *** I ordered medication *** and reviewed PMP when indicated. I ordered imaging studies which included *** and I independently    visualized and interpreted imaging which showed *** Additional history obtained from *** Previous  records obtained and reviewed *** I consulted *** and discussed lab and imaging findings and discussed disposition.  Cardiac monitoring reviewed, *** Social determinants considered, *** Critical Interventions: ***  After the interventions stated above, I reevaluated the patient and found *** Admission and further testing considered, ***   {Document critical care time when appropriate:1} {Document review of labs and clinical decision tools ie heart score, Chads2Vasc2 etc:1}  {Document your independent review of radiology images, and any outside records:1} {Document your discussion with family members, caretakers, and with consultants:1} {Document social determinants of health affecting pt's care:1} {Document your decision making why or why not admission, treatments were needed:1} Final Clinical Impression(s) / ED Diagnoses Final diagnoses:  None    Rx / DC Orders ED Discharge Orders     None

## 2023-03-09 NOTE — Discharge Instructions (Signed)
You were seen in the emergency department for evaluation of elevated calcium.  It did not seem to be causing you any troubles.  We rechecked your lab work and gave you some fluids and a diuretic to have you help lower the calcium.  Please avoid any calcium supplements at this time.  Continue your regular medications and follow back up with your primary care doctor.  You will also need to get your blood pressure rechecked as it was elevated here.  Return to the emergency department if any worsening or concerning symptoms.

## 2023-03-14 DIAGNOSIS — E559 Vitamin D deficiency, unspecified: Secondary | ICD-10-CM | POA: Diagnosis not present

## 2023-03-14 DIAGNOSIS — D509 Iron deficiency anemia, unspecified: Secondary | ICD-10-CM | POA: Diagnosis not present

## 2023-03-14 DIAGNOSIS — M797 Fibromyalgia: Secondary | ICD-10-CM | POA: Diagnosis not present

## 2023-03-14 DIAGNOSIS — Z7689 Persons encountering health services in other specified circumstances: Secondary | ICD-10-CM | POA: Diagnosis not present

## 2023-03-14 DIAGNOSIS — I10 Essential (primary) hypertension: Secondary | ICD-10-CM | POA: Diagnosis not present

## 2023-03-14 DIAGNOSIS — R5383 Other fatigue: Secondary | ICD-10-CM | POA: Diagnosis not present

## 2023-03-14 DIAGNOSIS — M810 Age-related osteoporosis without current pathological fracture: Secondary | ICD-10-CM | POA: Diagnosis not present

## 2023-03-14 DIAGNOSIS — R2689 Other abnormalities of gait and mobility: Secondary | ICD-10-CM | POA: Diagnosis not present

## 2023-04-11 DIAGNOSIS — I10 Essential (primary) hypertension: Secondary | ICD-10-CM | POA: Diagnosis not present

## 2023-04-11 DIAGNOSIS — D692 Other nonthrombocytopenic purpura: Secondary | ICD-10-CM | POA: Diagnosis not present

## 2023-04-11 DIAGNOSIS — D509 Iron deficiency anemia, unspecified: Secondary | ICD-10-CM | POA: Diagnosis not present

## 2023-04-11 DIAGNOSIS — E871 Hypo-osmolality and hyponatremia: Secondary | ICD-10-CM | POA: Diagnosis not present

## 2023-04-11 DIAGNOSIS — R2689 Other abnormalities of gait and mobility: Secondary | ICD-10-CM | POA: Diagnosis not present

## 2023-04-11 DIAGNOSIS — E876 Hypokalemia: Secondary | ICD-10-CM | POA: Diagnosis not present

## 2023-04-11 DIAGNOSIS — M797 Fibromyalgia: Secondary | ICD-10-CM | POA: Diagnosis not present

## 2023-04-11 DIAGNOSIS — M810 Age-related osteoporosis without current pathological fracture: Secondary | ICD-10-CM | POA: Diagnosis not present

## 2023-04-11 DIAGNOSIS — R5383 Other fatigue: Secondary | ICD-10-CM | POA: Diagnosis not present

## 2023-04-11 DIAGNOSIS — R634 Abnormal weight loss: Secondary | ICD-10-CM | POA: Diagnosis not present

## 2023-04-12 DIAGNOSIS — M810 Age-related osteoporosis without current pathological fracture: Secondary | ICD-10-CM | POA: Diagnosis not present

## 2023-05-04 DIAGNOSIS — R2689 Other abnormalities of gait and mobility: Secondary | ICD-10-CM | POA: Diagnosis not present

## 2023-05-05 DIAGNOSIS — E785 Hyperlipidemia, unspecified: Secondary | ICD-10-CM | POA: Diagnosis not present

## 2023-05-05 DIAGNOSIS — I10 Essential (primary) hypertension: Secondary | ICD-10-CM | POA: Diagnosis not present

## 2023-05-05 DIAGNOSIS — R7989 Other specified abnormal findings of blood chemistry: Secondary | ICD-10-CM | POA: Diagnosis not present

## 2023-05-10 DIAGNOSIS — R2689 Other abnormalities of gait and mobility: Secondary | ICD-10-CM | POA: Diagnosis not present

## 2023-05-13 DIAGNOSIS — R2689 Other abnormalities of gait and mobility: Secondary | ICD-10-CM | POA: Diagnosis not present

## 2023-05-17 DIAGNOSIS — R2689 Other abnormalities of gait and mobility: Secondary | ICD-10-CM | POA: Diagnosis not present

## 2023-05-24 DIAGNOSIS — R2689 Other abnormalities of gait and mobility: Secondary | ICD-10-CM | POA: Diagnosis not present

## 2023-05-27 DIAGNOSIS — R2689 Other abnormalities of gait and mobility: Secondary | ICD-10-CM | POA: Diagnosis not present

## 2023-05-31 DIAGNOSIS — R2689 Other abnormalities of gait and mobility: Secondary | ICD-10-CM | POA: Diagnosis not present

## 2023-06-02 DIAGNOSIS — R2689 Other abnormalities of gait and mobility: Secondary | ICD-10-CM | POA: Diagnosis not present

## 2023-06-07 DIAGNOSIS — R2689 Other abnormalities of gait and mobility: Secondary | ICD-10-CM | POA: Diagnosis not present

## 2023-06-10 DIAGNOSIS — R2689 Other abnormalities of gait and mobility: Secondary | ICD-10-CM | POA: Diagnosis not present

## 2023-06-14 DIAGNOSIS — R2689 Other abnormalities of gait and mobility: Secondary | ICD-10-CM | POA: Diagnosis not present

## 2023-06-17 DIAGNOSIS — R2689 Other abnormalities of gait and mobility: Secondary | ICD-10-CM | POA: Diagnosis not present

## 2023-06-21 DIAGNOSIS — R2689 Other abnormalities of gait and mobility: Secondary | ICD-10-CM | POA: Diagnosis not present

## 2023-06-30 DIAGNOSIS — R2689 Other abnormalities of gait and mobility: Secondary | ICD-10-CM | POA: Diagnosis not present

## 2023-07-05 DIAGNOSIS — R2689 Other abnormalities of gait and mobility: Secondary | ICD-10-CM | POA: Diagnosis not present

## 2023-07-07 DIAGNOSIS — R2689 Other abnormalities of gait and mobility: Secondary | ICD-10-CM | POA: Diagnosis not present

## 2023-07-14 DIAGNOSIS — R2689 Other abnormalities of gait and mobility: Secondary | ICD-10-CM | POA: Diagnosis not present

## 2023-07-19 DIAGNOSIS — I10 Essential (primary) hypertension: Secondary | ICD-10-CM | POA: Diagnosis not present

## 2023-07-19 DIAGNOSIS — E559 Vitamin D deficiency, unspecified: Secondary | ICD-10-CM | POA: Diagnosis not present

## 2023-07-19 DIAGNOSIS — E785 Hyperlipidemia, unspecified: Secondary | ICD-10-CM | POA: Diagnosis not present

## 2023-07-19 DIAGNOSIS — R2689 Other abnormalities of gait and mobility: Secondary | ICD-10-CM | POA: Diagnosis not present

## 2023-07-19 DIAGNOSIS — D509 Iron deficiency anemia, unspecified: Secondary | ICD-10-CM | POA: Diagnosis not present

## 2023-07-19 DIAGNOSIS — E871 Hypo-osmolality and hyponatremia: Secondary | ICD-10-CM | POA: Diagnosis not present

## 2023-07-26 DIAGNOSIS — E785 Hyperlipidemia, unspecified: Secondary | ICD-10-CM | POA: Diagnosis not present

## 2023-07-26 DIAGNOSIS — Z1331 Encounter for screening for depression: Secondary | ICD-10-CM | POA: Diagnosis not present

## 2023-07-26 DIAGNOSIS — I10 Essential (primary) hypertension: Secondary | ICD-10-CM | POA: Diagnosis not present

## 2023-07-26 DIAGNOSIS — R634 Abnormal weight loss: Secondary | ICD-10-CM | POA: Diagnosis not present

## 2023-07-26 DIAGNOSIS — I4510 Unspecified right bundle-branch block: Secondary | ICD-10-CM | POA: Diagnosis not present

## 2023-07-26 DIAGNOSIS — Z1339 Encounter for screening examination for other mental health and behavioral disorders: Secondary | ICD-10-CM | POA: Diagnosis not present

## 2023-07-26 DIAGNOSIS — Z0000 Encounter for general adult medical examination without abnormal findings: Secondary | ICD-10-CM | POA: Diagnosis not present

## 2023-07-26 DIAGNOSIS — M1990 Unspecified osteoarthritis, unspecified site: Secondary | ICD-10-CM | POA: Diagnosis not present

## 2023-07-26 DIAGNOSIS — D692 Other nonthrombocytopenic purpura: Secondary | ICD-10-CM | POA: Diagnosis not present

## 2023-07-26 DIAGNOSIS — R82998 Other abnormal findings in urine: Secondary | ICD-10-CM | POA: Diagnosis not present

## 2023-07-26 DIAGNOSIS — Z23 Encounter for immunization: Secondary | ICD-10-CM | POA: Diagnosis not present

## 2023-07-26 DIAGNOSIS — M797 Fibromyalgia: Secondary | ICD-10-CM | POA: Diagnosis not present

## 2023-07-26 DIAGNOSIS — D509 Iron deficiency anemia, unspecified: Secondary | ICD-10-CM | POA: Diagnosis not present

## 2023-07-26 DIAGNOSIS — R2689 Other abnormalities of gait and mobility: Secondary | ICD-10-CM | POA: Diagnosis not present

## 2023-08-02 DIAGNOSIS — R2689 Other abnormalities of gait and mobility: Secondary | ICD-10-CM | POA: Diagnosis not present

## 2023-08-03 DIAGNOSIS — T8529XA Other mechanical complication of intraocular lens, initial encounter: Secondary | ICD-10-CM | POA: Diagnosis not present

## 2023-08-03 DIAGNOSIS — H04221 Epiphora due to insufficient drainage, right lacrimal gland: Secondary | ICD-10-CM | POA: Diagnosis not present

## 2023-08-03 DIAGNOSIS — H50311 Intermittent monocular esotropia, right eye: Secondary | ICD-10-CM | POA: Diagnosis not present

## 2023-08-03 DIAGNOSIS — H18513 Endothelial corneal dystrophy, bilateral: Secondary | ICD-10-CM | POA: Diagnosis not present

## 2023-08-03 DIAGNOSIS — H4051X1 Glaucoma secondary to other eye disorders, right eye, mild stage: Secondary | ICD-10-CM | POA: Diagnosis not present

## 2023-08-03 DIAGNOSIS — Z961 Presence of intraocular lens: Secondary | ICD-10-CM | POA: Diagnosis not present

## 2023-08-09 DIAGNOSIS — R2689 Other abnormalities of gait and mobility: Secondary | ICD-10-CM | POA: Diagnosis not present

## 2023-08-11 DIAGNOSIS — R2689 Other abnormalities of gait and mobility: Secondary | ICD-10-CM | POA: Diagnosis not present

## 2023-08-16 DIAGNOSIS — R2689 Other abnormalities of gait and mobility: Secondary | ICD-10-CM | POA: Diagnosis not present

## 2023-08-23 DIAGNOSIS — R2689 Other abnormalities of gait and mobility: Secondary | ICD-10-CM | POA: Diagnosis not present

## 2023-08-30 DIAGNOSIS — R2689 Other abnormalities of gait and mobility: Secondary | ICD-10-CM | POA: Diagnosis not present

## 2023-09-06 DIAGNOSIS — R2689 Other abnormalities of gait and mobility: Secondary | ICD-10-CM | POA: Diagnosis not present

## 2023-09-13 DIAGNOSIS — R2689 Other abnormalities of gait and mobility: Secondary | ICD-10-CM | POA: Diagnosis not present

## 2023-09-20 DIAGNOSIS — R2689 Other abnormalities of gait and mobility: Secondary | ICD-10-CM | POA: Diagnosis not present

## 2023-09-28 DIAGNOSIS — R2689 Other abnormalities of gait and mobility: Secondary | ICD-10-CM | POA: Diagnosis not present

## 2023-10-04 DIAGNOSIS — R2689 Other abnormalities of gait and mobility: Secondary | ICD-10-CM | POA: Diagnosis not present

## 2023-10-12 DIAGNOSIS — R2689 Other abnormalities of gait and mobility: Secondary | ICD-10-CM | POA: Diagnosis not present

## 2023-10-18 DIAGNOSIS — R2689 Other abnormalities of gait and mobility: Secondary | ICD-10-CM | POA: Diagnosis not present

## 2023-11-09 DIAGNOSIS — D1801 Hemangioma of skin and subcutaneous tissue: Secondary | ICD-10-CM | POA: Diagnosis not present

## 2023-11-09 DIAGNOSIS — L820 Inflamed seborrheic keratosis: Secondary | ICD-10-CM | POA: Diagnosis not present

## 2023-11-09 DIAGNOSIS — L821 Other seborrheic keratosis: Secondary | ICD-10-CM | POA: Diagnosis not present

## 2023-12-01 DIAGNOSIS — D509 Iron deficiency anemia, unspecified: Secondary | ICD-10-CM | POA: Diagnosis not present

## 2023-12-01 DIAGNOSIS — M797 Fibromyalgia: Secondary | ICD-10-CM | POA: Diagnosis not present

## 2023-12-01 DIAGNOSIS — D692 Other nonthrombocytopenic purpura: Secondary | ICD-10-CM | POA: Diagnosis not present

## 2023-12-01 DIAGNOSIS — I10 Essential (primary) hypertension: Secondary | ICD-10-CM | POA: Diagnosis not present

## 2023-12-01 DIAGNOSIS — M1990 Unspecified osteoarthritis, unspecified site: Secondary | ICD-10-CM | POA: Diagnosis not present

## 2023-12-01 DIAGNOSIS — F325 Major depressive disorder, single episode, in full remission: Secondary | ICD-10-CM | POA: Diagnosis not present

## 2023-12-01 DIAGNOSIS — R5383 Other fatigue: Secondary | ICD-10-CM | POA: Diagnosis not present

## 2023-12-01 DIAGNOSIS — K219 Gastro-esophageal reflux disease without esophagitis: Secondary | ICD-10-CM | POA: Diagnosis not present

## 2023-12-01 DIAGNOSIS — I6990 Unspecified sequelae of unspecified cerebrovascular disease: Secondary | ICD-10-CM | POA: Diagnosis not present

## 2023-12-01 DIAGNOSIS — H409 Unspecified glaucoma: Secondary | ICD-10-CM | POA: Diagnosis not present

## 2023-12-01 DIAGNOSIS — E785 Hyperlipidemia, unspecified: Secondary | ICD-10-CM | POA: Diagnosis not present

## 2023-12-01 DIAGNOSIS — Z23 Encounter for immunization: Secondary | ICD-10-CM | POA: Diagnosis not present

## 2024-02-15 DIAGNOSIS — H4051X1 Glaucoma secondary to other eye disorders, right eye, mild stage: Secondary | ICD-10-CM | POA: Diagnosis not present

## 2024-02-23 ENCOUNTER — Other Ambulatory Visit: Payer: Self-pay | Admitting: Internal Medicine

## 2024-02-23 DIAGNOSIS — Z1231 Encounter for screening mammogram for malignant neoplasm of breast: Secondary | ICD-10-CM

## 2024-03-07 ENCOUNTER — Ambulatory Visit
Admission: RE | Admit: 2024-03-07 | Discharge: 2024-03-07 | Disposition: A | Discharge status: Home / Self Care | Source: Ambulatory Visit | Attending: Internal Medicine | Admitting: Internal Medicine

## 2024-03-07 DIAGNOSIS — Z1231 Encounter for screening mammogram for malignant neoplasm of breast: Secondary | ICD-10-CM | POA: Diagnosis not present

## 2024-03-23 DIAGNOSIS — K080 Exfoliation of teeth due to systemic causes: Secondary | ICD-10-CM | POA: Diagnosis not present

## 2024-04-12 DIAGNOSIS — G319 Degenerative disease of nervous system, unspecified: Secondary | ICD-10-CM | POA: Diagnosis not present

## 2024-04-12 DIAGNOSIS — I10 Essential (primary) hypertension: Secondary | ICD-10-CM | POA: Diagnosis not present

## 2024-05-21 DIAGNOSIS — R5383 Other fatigue: Secondary | ICD-10-CM | POA: Diagnosis not present

## 2024-07-04 ENCOUNTER — Encounter: Payer: Self-pay | Admitting: Gastroenterology

## 2024-07-17 DIAGNOSIS — D509 Iron deficiency anemia, unspecified: Secondary | ICD-10-CM | POA: Diagnosis not present

## 2024-07-24 DIAGNOSIS — I10 Essential (primary) hypertension: Secondary | ICD-10-CM | POA: Diagnosis not present

## 2024-07-31 DIAGNOSIS — R82998 Other abnormal findings in urine: Secondary | ICD-10-CM | POA: Diagnosis not present

## 2024-07-31 DIAGNOSIS — I10 Essential (primary) hypertension: Secondary | ICD-10-CM | POA: Diagnosis not present

## 2024-07-31 DIAGNOSIS — Z1389 Encounter for screening for other disorder: Secondary | ICD-10-CM | POA: Diagnosis not present

## 2024-07-31 DIAGNOSIS — Z1331 Encounter for screening for depression: Secondary | ICD-10-CM | POA: Diagnosis not present

## 2024-07-31 DIAGNOSIS — E440 Moderate protein-calorie malnutrition: Secondary | ICD-10-CM | POA: Diagnosis not present

## 2024-07-31 DIAGNOSIS — Z0000 Encounter for general adult medical examination without abnormal findings: Secondary | ICD-10-CM | POA: Diagnosis not present

## 2024-08-01 ENCOUNTER — Telehealth (HOSPITAL_COMMUNITY): Payer: Self-pay | Admitting: Pharmacy Technician

## 2024-08-01 NOTE — Telephone Encounter (Signed)
 Auth Submission: NO AUTH NEEDED Site of care: [facility] Payer: [employer] Medicare Medication & CPT/J Code(s) submitted: Feraheme (ferumoxytol) [id number] Diagnosis Code:  Route of submission (phone, fax, portal): phone Phone # Fax # Auth type: Buy/Bill HB Units/visits requested: 510mg  x 2 dose Reference number: [id number] Approval from: 08/01/24 to 12/19/24    Dagoberto Armour, CPhT [facility] Phone: [phone] 08/01/2024

## 2024-08-06 ENCOUNTER — Other Ambulatory Visit (HOSPITAL_COMMUNITY): Payer: Self-pay

## 2024-08-07 ENCOUNTER — Encounter (HOSPITAL_COMMUNITY)
Admission: RE | Admit: 2024-08-07 | Discharge: 2024-08-07 | Disposition: A | Discharge status: Home / Self Care | Source: Ambulatory Visit | Attending: Internal Medicine | Admitting: Internal Medicine

## 2024-08-07 DIAGNOSIS — D509 Iron deficiency anemia, unspecified: Secondary | ICD-10-CM | POA: Diagnosis not present

## 2024-08-07 DIAGNOSIS — D649 Anemia, unspecified: Secondary | ICD-10-CM | POA: Diagnosis not present

## 2024-08-07 MED ORDER — SODIUM CHLORIDE 0.9 % IV SOLN
510.0000 mg | INTRAVENOUS | Status: DC
  Administered 2024-08-07: 510 mg via INTRAVENOUS
  Filled 2024-08-07: qty 510

## 2024-08-08 DIAGNOSIS — H4051X1 Glaucoma secondary to other eye disorders, right eye, mild stage: Secondary | ICD-10-CM | POA: Diagnosis not present

## 2024-08-08 DIAGNOSIS — Z961 Presence of intraocular lens: Secondary | ICD-10-CM | POA: Diagnosis not present

## 2024-08-08 DIAGNOSIS — H50311 Intermittent monocular esotropia, right eye: Secondary | ICD-10-CM | POA: Diagnosis not present

## 2024-08-08 DIAGNOSIS — H18513 Endothelial corneal dystrophy, bilateral: Secondary | ICD-10-CM | POA: Diagnosis not present

## 2024-08-08 DIAGNOSIS — H04221 Epiphora due to insufficient drainage, right lacrimal gland: Secondary | ICD-10-CM | POA: Diagnosis not present

## 2024-08-08 DIAGNOSIS — T8529XD Other mechanical complication of intraocular lens, subsequent encounter: Secondary | ICD-10-CM | POA: Diagnosis not present

## 2024-08-14 ENCOUNTER — Encounter (HOSPITAL_COMMUNITY)
Admission: RE | Admit: 2024-08-14 | Discharge: 2024-08-14 | Disposition: A | Discharge status: Home / Self Care | Source: Ambulatory Visit | Attending: Internal Medicine

## 2024-08-14 DIAGNOSIS — D509 Iron deficiency anemia, unspecified: Secondary | ICD-10-CM | POA: Diagnosis not present

## 2024-08-14 DIAGNOSIS — D649 Anemia, unspecified: Secondary | ICD-10-CM | POA: Diagnosis not present

## 2024-08-14 MED ORDER — SODIUM CHLORIDE 0.9 % IV SOLN
510.0000 mg | INTRAVENOUS | Status: DC
  Administered 2024-08-14: 510 mg via INTRAVENOUS
  Filled 2024-08-14: qty 510

## 2024-09-05 DIAGNOSIS — T8529XD Other mechanical complication of intraocular lens, subsequent encounter: Secondary | ICD-10-CM | POA: Diagnosis not present

## 2024-09-05 DIAGNOSIS — H04221 Epiphora due to insufficient drainage, right lacrimal gland: Secondary | ICD-10-CM | POA: Diagnosis not present

## 2024-09-05 DIAGNOSIS — H18513 Endothelial corneal dystrophy, bilateral: Secondary | ICD-10-CM | POA: Diagnosis not present

## 2024-09-05 DIAGNOSIS — Z961 Presence of intraocular lens: Secondary | ICD-10-CM | POA: Diagnosis not present

## 2024-09-10 ENCOUNTER — Ambulatory Visit (INDEPENDENT_AMBULATORY_CARE_PROVIDER_SITE_OTHER): Admitting: Gastroenterology

## 2024-09-10 ENCOUNTER — Encounter: Payer: Self-pay | Admitting: Gastroenterology

## 2024-09-10 ENCOUNTER — Ambulatory Visit: Payer: Self-pay | Admitting: Gastroenterology

## 2024-09-10 ENCOUNTER — Other Ambulatory Visit (INDEPENDENT_AMBULATORY_CARE_PROVIDER_SITE_OTHER)

## 2024-09-10 VITALS — BP 150/80 | HR 59 | Ht 60.0 in | Wt 100.5 lb

## 2024-09-10 DIAGNOSIS — Z860100 Personal history of colon polyps, unspecified: Secondary | ICD-10-CM

## 2024-09-10 DIAGNOSIS — R634 Abnormal weight loss: Secondary | ICD-10-CM

## 2024-09-10 DIAGNOSIS — Z860101 Personal history of adenomatous and serrated colon polyps: Secondary | ICD-10-CM | POA: Diagnosis not present

## 2024-09-10 DIAGNOSIS — D509 Iron deficiency anemia, unspecified: Secondary | ICD-10-CM

## 2024-09-10 DIAGNOSIS — K219 Gastro-esophageal reflux disease without esophagitis: Secondary | ICD-10-CM | POA: Diagnosis not present

## 2024-09-10 LAB — CBC WITH DIFFERENTIAL/PLATELET
Basophils Absolute: 0.1 K/uL (ref 0.0–0.1)
Basophils Relative: 1.1 % (ref 0.0–3.0)
Eosinophils Absolute: 0.1 K/uL (ref 0.0–0.7)
Eosinophils Relative: 1.4 % (ref 0.0–5.0)
HCT: 39.3 % (ref 36.0–46.0)
Hemoglobin: 12.4 g/dL (ref 12.0–15.0)
Lymphocytes Relative: 17.5 % (ref 12.0–46.0)
Lymphs Abs: 1.2 K/uL (ref 0.7–4.0)
MCHC: 31.6 g/dL (ref 30.0–36.0)
MCV: 86.5 fl (ref 78.0–100.0)
Monocytes Absolute: 0.6 K/uL (ref 0.1–1.0)
Monocytes Relative: 8.3 % (ref 3.0–12.0)
Neutro Abs: 5.1 K/uL (ref 1.4–7.7)
Neutrophils Relative %: 71.7 % (ref 43.0–77.0)
Platelets: 279 K/uL (ref 150.0–400.0)
RBC: 4.54 Mil/uL (ref 3.87–5.11)
RDW: 26.2 % — ABNORMAL HIGH (ref 11.5–15.5)
WBC: 7.1 K/uL (ref 4.0–10.5)

## 2024-09-10 LAB — BASIC METABOLIC PANEL WITH GFR
BUN: 10 mg/dL (ref 6–23)
CO2: 29 meq/L (ref 19–32)
Calcium: 10.5 mg/dL (ref 8.4–10.5)
Chloride: 100 meq/L (ref 96–112)
Creatinine, Ser: 0.57 mg/dL (ref 0.40–1.20)
GFR: 81.28 mL/min (ref >60.00)
Glucose, Bld: 99 mg/dL (ref 70–99)
Potassium: 4.3 meq/L (ref 3.5–5.1)
Sodium: 135 meq/L (ref 135–145)

## 2024-09-10 LAB — IBC + FERRITIN
Ferritin: 227.2 ng/mL (ref 10.0–291.0)
Iron: 70 ug/dL (ref 42–145)
Saturation Ratios: 22.4 % (ref 20.0–50.0)
TIBC: 312.2 ug/dL (ref 250.0–450.0)
Transferrin: 223 mg/dL (ref 212.0–360.0)

## 2024-09-10 NOTE — Patient Instructions (Addendum)
 Please go to the lab in the basement of our building to have lab work done as you leave today. Hit B for basement when you get on the elevator.  When the doors open the lab is on your left.  We will call you with the results. Thank you.  You have been scheduled for a CT scan of the abdomen and pelvis at [facility]. The address can be found later in this AVS under upcoming appointment. You are scheduled on Friday, 9-26 at 1:00pm. You should arrive at 10:45 am for registration and to drink contrast prior to the exam.   Plan on being there 30 to 60 minutes, depending on the type of exam you are having performed.   If you have any questions regarding your exam or if you need to reschedule, you may call [facility] Radiology Scheduling at [phone] between the hours of 8:00 am and 5:00 pm, Monday-Friday.      Thank you for entrusting me with your care and for choosing [facility], Dr. Elspeth Naval   _______________________________________________________  If your blood pressure at your visit was 140/90 or greater, please contact your primary care physician to follow up on this.  _______________________________________________________  If you are age 64 or older, your body mass index should be between 23-30. Your Body mass index is 19.63 kg/m. If this is out of the aforementioned range listed, please consider follow up with your Primary Care Provider.  If you are age 47 or younger, your body mass index should be between 19-25. Your Body mass index is 19.63 kg/m. If this is out of the aformentioned range listed, please consider follow up with your Primary Care Provider.   ________________________________________________________  The [facility] providers would like to encourage you to use MYCHART to communicate with providers for non-urgent requests or questions.  Due to long hold times on the telephone, sending your provider a message by [facility] may be a faster and more  efficient way to get a response.  Please allow 48 business hours for a response.  Please remember that this is for non-urgent requests.  _______________________________________________________  [facility] is using a team-based approach to care.  Your team is made up of your doctor and two to three APPS. Our APPS (Nurse Practitioners and Physician Assistants) work with your physician to ensure care continuity for you. They are fully qualified to address your health concerns and develop a treatment plan. They communicate directly with your gastroenterologist to care for you. Seeing the Advanced Practice Practitioners on your physician's team can help you by facilitating care more promptly, often allowing for earlier appointments, access to diagnostic testing, procedures, and other specialty referrals.

## 2024-09-10 NOTE — Progress Notes (Signed)
 HPI :  89 year old female with a history of chronic iron deficiency, severe diverticulosis, colon polyps, here to reestablish care for some of these issues.  I have not seen her since February 2021.  She is accompanied by her granddaughter today.  Recall she has had iron deficiency anemia dating back to 2011.  She has had history of lower GI bleeding secondary to diverticulosis in the past.  She has had a colonoscopy with me in December 2016, with a few small polyps removed, adenomas.  She had severe diverticulosis noted with angulated turns and this was a technically challenging exam.  We had foregone subsequent colonoscopy given her age and difficulty with her last exam, at risk for complications.  She had an EGD in August 2020 showing small hiatal hernia and some gastritis with duodenal diverticulum.  Remotely had a capsule endoscopy in 2011 showing a bulge of the mid small bowel.  We repeated a capsule endoscopy in 2020 which was an incomplete exam with poor prep.  She has had CT scan of the abdomen pelvis back in 2016 which did not show any concerning GI pathology, diverticulosis noted.  She tells me since have last seen her she has had worsening of iron deficiency anemia leading to iron infusions.  I was able to get labs from Dr. Monia office dated from July 29.  Normal renal and liver function testing at that time.  She had a hemoglobin of 9.3 with an MCV of 75.  TSH of 0.6.  Iron level of 28 with a TIBC of 421 and iron saturation level of 7%.  Ferritin level of 8, based on these labs she was referred for IV iron infusion.  She has not yet had follow-up labs from what she tells me.  Generally she has been feeling okay.  She does not see any red blood in her stool.  She states some of her stools are darker than usual however denies any melena or tarry stools, seem to be dark brown.  She is not taking any NSAIDs.  She previously was on Xarelto  for a period of time and is no longer on Xarelto .   She has rare stomach pains in her mid to left abdomen, seem to come and go but not frequent.  She moves her bowels well, no constipation, previously was on Metamucil.  The main issue she has had has been some decreased appetite in recent months which was associated with 10 pound weight loss.  Denies any postprandial abdominal pain.  No reflux symptoms.  She takes omeprazole  20 mg daily and that controls her reflux pretty well if she is compliant with it.  If she does not take it she can have reflux that bothers her.  Her previous EGD again did not show any concerning pathology.  She was placed on Remeron and that has helped with some of her appetite lately and she thinks weight has stabilized.  She denies any cardiopulmonary symptoms.  We discussed how aggressive she want to be with her workup given her extensive workup previously and her current age, risks of procedures.  Most recent evaluation: Capsule 07/11/19 - poor prep of small bowel, limited exam, unclear if capsule got caught in diverticulum   EGD 08/09/19 -  - A 2 cm hiatal hernia was present. - The exam of the esophagus was otherwise normal. - Patchy mildly erythematous mucosa was found in the gastric antrum without focal ulceration. - The exam of the stomach was otherwise normal. - Biopsies  were taken with a cold forceps in the gastric body, at the incisura and in the gastric antrum for Helicobacter pylori testing. - Two diverticulum were found in the duodenum - one in the sweep and another one in the mid second portion. - The exam of the duodenum was otherwise normal.   Biopsies negative for HP   Colonoscopy 12/19/2015 - There was pancolonic diverticulosis noted. Severe in the left colon with multiple angulated turns, making for a prolonged cecal intubation. Moderate diverticulosis noted in the right colon. No red blood or old blood appreciated. There was a 5mm sessile cecal polyp noted that was removed via cold snare. Another 5-21mm  sessile polyp was noted along the back side of the IC valve removed with cold snare, and another 4mm sessile polyp along the back side of the IC valve removed with cold forceps. The terminal ileum was intubated and normal without blood. None of the diverticula had stigmata of recent bleeding and time was taken to inspect them all. Following retroflexion a small mucosal wrent was noted in the distal rectum just proximal to the dentate line with some small oozing which resolved with observation. Retroflexed views revealed no abnormalities. The time to cecum = 6.7 Withdrawal time = 35.4 The scope was withdrawn and the procedure completed.   CT abdomen / pelvis 12/17/15: IMPRESSION: Extensive colonic diverticulosis without definite evidence of diverticulitis.   Hepatic cysts.   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   Small hiatal hernia.   Past Medical History:  Diagnosis Date   Allergy    Anemia    Arthritis    Barrett esophagus    Barrett's esophagus 11/02/2007   Qualifier: Diagnosis of  By: Tivis RN, Cheryl     Blood transfusion without reported diagnosis    late 20's or early 30's   Cataract    Diverticulosis    ECTOPIC PREGNANCY 02/08/2008   Qualifier: Diagnosis of  By: Tivis RN, Channing     Family history of adverse reaction to anesthesia    older sister dont remember what it was    Fibromyalgia    takes elavil    Gastritis    GASTRITIS 11/02/2007   Qualifier: Diagnosis of  By: Tivis RN, Channing     GERD (gastroesophageal reflux disease)    Glaucoma    right eye   Heart murmur    Hiatal hernia    History of GI diverticular bleed    Hyperlipidemia    Hypertension    Iron deficiency 2020   Iron deficiency anemia    Osteoporosis    Palpitations 04/02/2010   Qualifier: Diagnosis of  By: Verlin, MD, Christopher     PONV (postoperative nausea and vomiting)    after c section   Presbyesophagus    Stroke (HCC)    small stroke 2010- no residual  per pt     Past Surgical History:  Procedure Laterality Date   CATARACT EXTRACTION, BILATERAL     CESAREAN SECTION  1950-1960s   x 3   CHOLECYSTECTOMY     COLONOSCOPY N/A 12/19/2015   Procedure: COLONOSCOPY;  Surgeon: Elspeth Deward Naval, MD;  Location: [facility];  Service: Gastroenterology;  Laterality: N/A;   COLONOSCOPY     ECTOPIC PREGNANCY SURGERY     1962   FACIAL COSMETIC SURGERY     LAPAROSCOPIC CHOLECYSTECTOMY SINGLE SITE WITH INTRAOPERATIVE CHOLANGIOGRAM N/A 10/27/2014   Procedure: LAPAROSCOPIC CHOLECYSTECTOMY SINGLE SITE WITH INTRAOPERATIVE CHOLANGIOGRAM;  Surgeon: Elspeth Schultze, MD;  Location: [facility] ORS;  Service: General;  Laterality: N/A;   TOTAL KNEE ARTHROPLASTY Left 03/11/2020   Procedure: TOTAL KNEE ARTHROPLASTY;  Surgeon: Ernie Cough, MD;  Location: [facility];  Service: Orthopedics;  Laterality: Left;  70 mins   TUBAL LIGATION     UPPER GASTROINTESTINAL ENDOSCOPY     Family History  Problem Relation Age of Onset   Heart disease Mother    Stroke Mother    Heart disease Father    Breast cancer Sister    Diabetes Sister    Kidney disease Sister    Colon cancer Neg Hx    Esophageal cancer Neg Hx    Rectal cancer Neg Hx    Stomach cancer Neg Hx    Social History   Tobacco Use   Smoking status: Never   Smokeless tobacco: Never  Vaping Use   Vaping status: Never Used  Substance Use Topics   Alcohol  use: Yes    Alcohol /week: 1.0 standard drink of alcohol     Types: 1 Glasses of wine per week    Comment: rarely   Drug use: Never   Current Outpatient Medications  Medication Sig Dispense Refill   atorvastatin  (LIPITOR) 20 MG tablet Take 20 mg by mouth daily.       benazepril  (LOTENSIN ) 20 MG tablet Take 20 mg by mouth daily.     Camphor-Menthol -Methyl Sal (SALONPAS EX) Apply 1 application topically daily as needed (pain.).     dorzolamide -timolol  (COSOPT ) 22.3-6.8 MG/ML ophthalmic solution Place 1 drop into the right eye 2 (two) times daily.       HYDROcodone -acetaminophen  (NORCO) 5-325 MG tablet Take 1-2 tablets by mouth every 4 (four) hours as needed for moderate pain or severe pain. 60 tablet 0   latanoprost  (XALATAN ) 0.005 % ophthalmic solution Place 1 drop into the right eye at bedtime.      loratadine  (CLARITIN ) 10 MG tablet Take 10 mg by mouth every other day.      Menthol , Topical Analgesic, (BIOFREEZE ROLL-ON EX) Apply 1 application topically 3 (three) times daily as needed (pain.).      omeprazole  (PRILOSEC) 20 MG capsule Take 20 mg by mouth daily.      polyvinyl alcohol  (LIQUIFILM TEARS) 1.4 % ophthalmic solution Place 1 drop into both eyes 3 (three) times daily as needed for dry eyes.      prednisoLONE  acetate (PRED FORTE ) 1 % ophthalmic suspension Place 1 drop into the right eye every other day.      Psyllium (METAMUCIL PO) Take 1 each by mouth daily. 1 heaping teaspoon daily     Saline 2.65 % SOLN Place 1 each into the nose daily as needed (congestion).     calcium  carbonate (TUMS EX) 750 MG chewable tablet Chew 1 tablet by mouth daily as needed for heartburn. (Patient not taking: Reported on 09/10/2024)     denosumab  (PROLIA ) 60 MG/ML SOLN injection Inject 60 mg into the skin every 6 (six) months. Administer in upper arm, thigh, or abdomen (Patient not taking: Reported on 09/10/2024)     docusate sodium  (COLACE) 100 MG capsule Take 1 capsule (100 mg total) by mouth 2 (two) times daily. (Patient not taking: Reported on 09/10/2024) 28 capsule 0   Ferrous Sulfate  (SLOW FE) 142 (45 Fe) MG TBCR Take 45 mg by mouth every other day.  (Patient not taking: Reported on 09/10/2024)     fluticasone  (FLONASE ) 50 MCG/ACT nasal spray Place 1 spray into both nostrils every other day. (Patient not taking: Reported on 09/10/2024)  hydrochlorothiazide  (HYDRODIURIL ) 25 MG tablet Take 25 mg by mouth every other day.  (Patient not taking: Reported on 09/10/2024)     Magnesium  250 MG TABS Take 250 mg by mouth every other day.  (Patient not taking:  Reported on 09/10/2024)     methocarbamol  (ROBAXIN ) 500 MG tablet Take 1 tablet (500 mg total) by mouth every 6 (six) hours as needed for muscle spasms. (Patient not taking: Reported on 09/10/2024) 40 tablet 0   mirtazapine (REMERON) 7.5 MG tablet Take 7.5 mg by mouth at bedtime.     polyethylene glycol (MIRALAX  / GLYCOLAX ) 17 g packet Take 17 g by mouth 2 (two) times daily. (Patient not taking: Reported on 09/10/2024) 28 packet 0   potassium chloride  SA (KLOR-CON  M) 20 MEQ tablet Take 1 tablet (20 mEq total) by mouth daily for 3 days. (Patient not taking: Reported on 09/10/2024) 3 tablet 0   rivaroxaban  (XARELTO ) 10 MG TABS tablet Take 1 tablet (10 mg total) by mouth daily. (Patient not taking: Reported on 09/10/2024) 21 tablet 0   traMADol  (ULTRAM ) 50 MG tablet Take 50 mg by mouth 2 (two) times daily as needed.     vitamin B-12 (CYANOCOBALAMIN) 1000 MCG tablet Take 1,000 mcg by mouth daily. (Patient not taking: Reported on 09/10/2024)     Vitamin D, Cholecalciferol , 50 MCG (2000 UT) CAPS Take 2,000 Units by mouth daily.  (Patient not taking: Reported on 09/10/2024)     No current facility-administered medications for this visit.   Allergies  Allergen Reactions   Codeine Other (See Comments)    Perception off      Review of Systems: All systems reviewed and negative except where noted in HPI.   - labs per HPI  Physical Exam: BP (!) 150/80   Pulse (!) 59   Ht 5' (1.524 m)   Wt 100 lb 8 oz (45.6 kg)   BMI 19.63 kg/m  Constitutional: Pleasant,well-developed, female in no acute distress. HEENT: Normocephalic and atraumatic. Conjunctivae are normal. No scleral icterus. Neck supple.  Cardiovascular: Normal rate, regular rhythm.  Pulmonary/chest: Effort normal and breath sounds normal. No wheezing, rales or rhonchi. Abdominal: Soft, nondistended, nontender. There are no masses palpable.  Extremities: no edema Lymphadenopathy: No cervical adenopathy noted. Neurological: Alert and oriented  to person place and time. Skin: Skin is warm and dry. No rashes noted. Psychiatric: Normal mood and affect. Behavior is normal.   ASSESSMENT: 89 y.o. female here for assessment of the following  1. Iron deficiency anemia, unspecified iron deficiency anemia type   2. Loss of weight   3. History of colon polyps   4. Gastroesophageal reflux disease, unspecified whether esophagitis present    Longstanding iron deficiency anemia dating back almost 15 years.  She has had an EGD and colonoscopy in the setting of this anemia previously without clear abnormality to cause this.  Have suspected small bowel source, however not been able to confirm that on capsule study.  Her last capsule study was incomplete, concerned it was retained in a diverticulum for some time.  She has had recurrent anemia with severe iron deficiency in recent months, nonspecific loss of appetite and mild weight loss.  Now on Remeron and she thinks appetite has picked up and weight seems stable.  Discussed how aggressive she wanted to be with this evaluation.  Her colonoscopy was technically quite challenging with severe diverticulosis, at her age and with these findings I think higher than average risk for colonic injury with colonoscopy and likely  lower yield given no cause for iron deficiency on her last exam.  Discussed other options to include EGD, repeat capsule, versus cross-sectional imaging.  With her weight loss and loss of appetite I think CT scan abdomen pelvis may be next best option here to ensure no interval changes since her last imaging in 2016, grossly assess for malignancy and make sure okay.  Following discussion of options she wants to pursue the CT scan.  If she has any overt bleeding or failure to improve her anemia with IV iron and CT does not show any concerning cause, could consider a repeat upper endoscopy and/or capsule.  Depends on how aggressive she wants to be with this and her course related to response to  IV iron.  She has not had labs checked recently, will check CBC, TIBC ferritin today to assess response to IV iron and her kidney function in regards to pending CT scan.  She is agreeable with this, further recommendations pending the results of her labs and CT scan results.  If she has any overt bleeding in the interim she should let us  know.  Otherwise she does have chronic reflux, on chronic PPI, low-dose omeprazole .  She feels this benefits her and if she stops that she has recurrent symptoms that bother her, we will continue omeprazole  for now.  PLAN: - received labs from Dr. Onita - CBC, iron studies, CMET - repeat CBC and TIBC / ferritin today, assess response to IV iron - discussed options as outlined above - will obtaine CT scan abdomen / pelvis - recurrent IDA, weight loss - continue Remeron for weight / appetite - doing better and weight has stablized - holding off on colonoscopy / endoscopic evaluation right now as above - continue omeprazole   Marcey Naval, MD [facility]  CC: Onita Rush, MD

## 2024-09-14 ENCOUNTER — Ambulatory Visit (HOSPITAL_BASED_OUTPATIENT_CLINIC_OR_DEPARTMENT_OTHER)
Admission: RE | Admit: 2024-09-14 | Discharge: 2024-09-14 | Disposition: A | Discharge status: Home / Self Care | Source: Ambulatory Visit | Attending: Gastroenterology | Admitting: Gastroenterology

## 2024-09-14 DIAGNOSIS — R634 Abnormal weight loss: Secondary | ICD-10-CM | POA: Diagnosis not present

## 2024-09-14 DIAGNOSIS — D509 Iron deficiency anemia, unspecified: Secondary | ICD-10-CM | POA: Insufficient documentation

## 2024-09-14 MED ORDER — IOHEXOL 300 MG/ML  SOLN
100.0000 mL | Freq: Once | INTRAMUSCULAR | Status: AC | PRN
  Administered 2024-09-14: 80 mL via INTRAVENOUS

## 2024-09-20 NOTE — Telephone Encounter (Signed)
 Patient returning your call, please advise. Thank you.

## 2024-09-21 NOTE — Telephone Encounter (Signed)
 Patient requesting a call to discuss results before the end of the weekend. States she is getting frustrated. Please advise, thank you

## 2024-11-12 DIAGNOSIS — D1801 Hemangioma of skin and subcutaneous tissue: Secondary | ICD-10-CM | POA: Diagnosis not present

## 2024-11-12 DIAGNOSIS — D224 Melanocytic nevi of scalp and neck: Secondary | ICD-10-CM | POA: Diagnosis not present

## 2024-11-12 DIAGNOSIS — L218 Other seborrheic dermatitis: Secondary | ICD-10-CM | POA: Diagnosis not present

## 2024-11-12 DIAGNOSIS — L821 Other seborrheic keratosis: Secondary | ICD-10-CM | POA: Diagnosis not present
